# Patient Record
Sex: Male | Born: 1945 | Race: White | Hispanic: No | Marital: Married | Smoking: Former smoker
Health system: Southern US, Community
[De-identification: ages and names within clinical notes are randomized; demographics above are authoritative.]

## PROBLEM LIST (undated history)

## (undated) DIAGNOSIS — E119 Type 2 diabetes mellitus without complications: Secondary | ICD-10-CM

## (undated) DIAGNOSIS — E7800 Pure hypercholesterolemia, unspecified: Secondary | ICD-10-CM

## (undated) DIAGNOSIS — I10 Essential (primary) hypertension: Secondary | ICD-10-CM

## (undated) DIAGNOSIS — G7000 Myasthenia gravis without (acute) exacerbation: Secondary | ICD-10-CM

## (undated) DIAGNOSIS — N200 Calculus of kidney: Secondary | ICD-10-CM

## (undated) DIAGNOSIS — M1990 Unspecified osteoarthritis, unspecified site: Secondary | ICD-10-CM

## (undated) HISTORY — DX: Calculus of kidney: N20.0

## (undated) HISTORY — DX: Pure hypercholesterolemia, unspecified: E78.00

## (undated) HISTORY — PX: COLONOSCOPY: SHX174

## (undated) HISTORY — DX: Type 2 diabetes mellitus without complications: E11.9

## (undated) HISTORY — DX: Unspecified osteoarthritis, unspecified site: M19.90

## (undated) HISTORY — DX: Essential (primary) hypertension: I10

---

## 2011-06-29 DEATH — deceased

## 2014-07-29 HISTORY — PX: SKIN CANCER EXCISION: SHX779

## 2017-02-21 ENCOUNTER — Encounter: Payer: Self-pay | Admitting: Neurology

## 2017-02-21 ENCOUNTER — Ambulatory Visit (INDEPENDENT_AMBULATORY_CARE_PROVIDER_SITE_OTHER): Payer: Medicare Other | Admitting: Neurology

## 2017-02-21 VITALS — Ht 71.0 in | Wt 217.0 lb

## 2017-02-21 DIAGNOSIS — H02402 Unspecified ptosis of left eyelid: Secondary | ICD-10-CM | POA: Diagnosis not present

## 2017-02-21 DIAGNOSIS — D150 Benign neoplasm of thymus: Secondary | ICD-10-CM

## 2017-02-21 DIAGNOSIS — D4989 Neoplasm of unspecified behavior of other specified sites: Secondary | ICD-10-CM

## 2017-02-21 DIAGNOSIS — G7000 Myasthenia gravis without (acute) exacerbation: Secondary | ICD-10-CM

## 2017-02-21 DIAGNOSIS — G902 Horner's syndrome: Secondary | ICD-10-CM

## 2017-02-21 DIAGNOSIS — R29818 Other symptoms and signs involving the nervous system: Secondary | ICD-10-CM

## 2017-02-21 DIAGNOSIS — H5700 Unspecified anomaly of pupillary function: Secondary | ICD-10-CM | POA: Diagnosis not present

## 2017-02-21 DIAGNOSIS — I671 Cerebral aneurysm, nonruptured: Secondary | ICD-10-CM | POA: Diagnosis not present

## 2017-02-21 DIAGNOSIS — I670 Dissection of cerebral arteries, nonruptured: Secondary | ICD-10-CM | POA: Diagnosis not present

## 2017-02-21 DIAGNOSIS — I639 Cerebral infarction, unspecified: Secondary | ICD-10-CM

## 2017-02-21 MED ORDER — PYRIDOSTIGMINE BROMIDE 60 MG PO TABS: 60.0000 mg | ORAL_TABLET | Freq: Three times a day (TID) | ORAL | 6 refills | Status: DC

## 2017-02-21 NOTE — Patient Instructions (Addendum)
Remember to drink plenty of fluid, eat healthy meals and do not skip any meals. Try to eat protein with a every meal and eat a healthy snack such as fruit or nuts in between meals. Try to keep a regular sleep-wake schedule and try to exercise daily, particularly in the form of walking, 20-30 minutes a day, if you can.   As far as your medications are concerned, I would like to suggest: Pyridostigmine 1/2 a tablet 3 times a day 3-4 hours apart can increase to a whole pill if no side effects.  As far as diagnostic testing: MRI of the brain, MRA of the head and neck, CT scan of the chest, EMG nerve conduction study.  I would like to see you back in 3 weeks for follow up as well as emg/ncs, sooner if we need to. Please call us with any interim questions, concerns, problems, updates or refill requests.   Our phone number is [phone]. We also have an after hours call service for urgent matters and there is a physician on-call for urgent questions. For any emergencies you know to call 911 or go to the nearest emergency room  Myasthenia Gravis Myasthenia gravis (MG) means severe weakness. It is a long-term (chronic) condition that causes weakness in the muscles you can control (voluntary muscles). MG can affect any voluntary muscle. The muscles most often affected are the ones that control:  Eye movement.  Facial movements.  Swallowing.  MG is an autoimmune disease, which means that your body's defense system (immune system) attacks healthy parts of your body instead of germs and other things that make you sick. When you have MG, your immune system makes proteins (antibodies) that block the chemical (acetylcholine) your body needs to send nerve signals to your muscles. This causes muscle weakness. What are the causes? The exact cause of MG is unknown. One possible cause is an enlarged thymus gland, which is located under your breastbone. What are the signs or symptoms? The earliest symptom of MG  is muscle weakness that gets worse with activity and gets better after rest. Other symptoms of MG may include:  Drooping eyelids.  Double vision.  Loss of facial expression.  Trouble chewing and swallowing.  Slurred speech.  A waddling walk.  Weakness of the arms, hands, and legs.  Trouble breathing is the most dangerous symptom of MG. Sudden and severe difficulty breathing (myasthenic crisis) may require emergency breathing support. This symptom sometimes happens after:  Infection.  Fever.  Drug reaction.  How is this diagnosed? It can be hard to diagnose MG because muscle weakness is a common symptom in many conditions. Your health care provider will do a physical exam. You may also have tests that will help make a diagnosis. These may include:  A blood test.  A test using the medicine edrophonium. This medicine increases muscle strength by slowing the breakdown of acetylcholine.  Tests to measure nerve conduction to muscle (electromyography).  An imaging study of the chest (CT or MRI).  How is this treated? Treatment can improve muscle strength. Sometimes symptoms of MG go away for a while (remission) and you can stop treatment. Possible treatments include:  Medicine.  Removal of the thymus gland (thymectomy). This may result in a long remission for some people.  Follow these instructions at home:  Take medicines only as directed by your health care provider.  Get plenty of rest to conserve your energy.  Take frequent breaks to rest your eyes.  Maintain a  healthy diet and a healthy weight.  Do not use any tobacco products including cigarettes, chewing tobacco, or electronic cigarettes. If you need help quitting, ask your health care provider.  Keep all follow-up visits as directed by your health care provider. This is important. Contact a health care provider if:  Your symptoms get worse after a fever or infection.  You have a reaction to a medicine you  are taking.  Your symptoms change or get worse. Get help right away if: You have trouble breathing. This information is not intended to replace advice given to you by your health care provider. Make sure you discuss any questions you have with your health care provider. Document Released: 10/21/2000 Document Revised: 12/21/2015 Document Reviewed: 09/15/2013 Elsevier Interactive Patient Education  2018 [employer].  Pyridostigmine tablets, extended-release What is this medicine? PYRIDOSTIGMINE (peer id oh STIG meen) can help with muscle strength. It is used to treat myasthenia gravis. This medicine may be used for other purposes; ask your health care provider or pharmacist if you have questions. COMMON BRAND NAME(S): Mestinon What should I tell my health care provider before I take this medicine? They need to know if you have any of these conditions: -asthma -difficulty passing urine -heart disease -infection in abdomen, peritonitis -irregular, slow heartbeat -kidney disease -seizures -stomach or bowel obstruction or ulcers -thyroid disease -an unusual or allergic reaction to pyridostigmine, bromides, other medicines, foods, dyes, or preservatives -pregnant or trying to get pregnant -breast-feeding How should I use this medicine? Take this medicine by mouth with a glass of water. Follow the directions on the prescription label. Do not crush or chew. Take your medicine at regular intervals. Do not take your medicine more often than directed. Do not stop taking except on your doctor's advice. Talk to your pediatrician regarding the use of this medicine in children. Special care may be needed. Overdosage: If you think you have taken too much of this medicine contact a poison control center or emergency room at once. NOTE: This medicine is only for you. Do not share this medicine with others. What if I miss a dose? If you miss a dose, take it as soon as you can. If it is almost time  for your next dose, take only that dose. Do not take double or extra doses. What may interact with this medicine? Do not take this medicine with any of the following medications: -other medicines for myasthenia gravis like neostigmine -quinine This medicine may also interact with the following medications: -atropine -bethanechol -disopyramide -edrophonium -guanadrel -guanethidine -mecamylamine -medicines that block muscle or nerve pain This list may not describe all possible interactions. Give your health care provider a list of all the medicines, herbs, non-prescription drugs, or dietary supplements you use. Also tell them if you smoke, drink alcohol, or use illegal drugs. Some items may interact with your medicine. What should I watch for while using this medicine? Visit your doctor or health care professional for regular checks on your progress. Tell your doctor if your symptoms do not improve or if they get worse. Wear a medical ID bracelet or chain, and carry a card that describes your disease and details of your medicine and dosage times. What side effects may I notice from receiving this medicine? Side effects that you should report to your doctor or health care professional as soon as possible: -allergic reactions like skin rash, itching or hives, swelling of the face, lips, or tongue -breathing problems -changes in vision -muscle cramps, spasm -  slow or irregular heartbeat -stomach cramps, pain -unusually weak or tired -vomiting Side effects that usually do not require medical attention (report to your doctor or health care professional if they continue or are bothersome): -diarrhea, especially at start of treatment -increased saliva -increased sweating -nausea This list may not describe all possible side effects. Call your doctor for medical advice about side effects. You may report side effects to FDA at 1-800-FDA-1088. Where should I keep my medicine? Keep out of the  reach of children. Store at room temperature between 15 and 30 degrees C (59 and 86 degrees F). Keep container tightly closed. Protect from moisture. Throw away any unused medicine after the expiration date. NOTE: This sheet is a summary. It may not cover all possible information. If you have questions about this medicine, talk to your doctor, pharmacist, or health care provider.  2018 Elsevier/Gold Standard (2008-02-19 11:19:07)

## 2017-02-21 NOTE — Progress Notes (Signed)
[facility]    Provider:  Dr Jaynee Eagles Referring Provider: [name], MD Primary Care Physician:  [name], MD  CC: left upper eyelid droop concern for myasthenia gravis  HPI:  Bob Warren is a 71 y.o. male here as a referral from [name], MD for left upper eyelid droop. Past medical history diabetes type 2, hypertension, hyperlipidemia. Eyelid drooping started last Sunday. He noticed it a little Sunday and Monday went to the eye. Not so bad in the morning. Worse at night. The ptosis is still there in the morning. No double vision. The right eye is fine. No difficulty swallowing or chewing, no changes in voice quality, no shortness of breath, He feels his left leg is getting weaker and when going down it feels weaker. Having difficulty getting out of low seats, no difficulty with raising arms overhead. No Fhx of autoimmune disorders. No other focal neurologic deficits, associated symptoms, inciting events or modifiable factors.  Reviewed notes, labs and imaging from outside physicians, which showed:  Reviewed [facility] records. Patient presented July 18 for drooping of eyelids. Occurred suddenly 4 days previous constant with no change. He denies diplopia, headaches, decreased vision, shortness of breath or other symptoms. Hasn't really changed since it became droopy. Blood sugars have been running high lately hemoglobin A1c 7.5. OD corrected 20/30, OS 20/50 corrected. Intraocular pressure normal. Pupils show anisocoria OS greater than OD. Open angles, extraocular movements are full, slit lamp exam normal, funduscopic exam normal, eyelids with dermatochalasis, OS with ptosis. Ice test was positive.  Review of Systems: Patient complains of symptoms per HPI as well as the following symptoms: No CP, no SOB. Pertinent negatives and positives per HPI. All others negative.   Social History   Social History  . Marital status: Married    Spouse name:  N/A  . Number of children: 2  . Years of education: 12   Occupational History  . Retired    Social History Main Topics  . Smoking status: Former Research scientist (life sciences)  . Smokeless tobacco: Never Used     Comment: Quit 25 yrs ago  . Alcohol use No     Comment: Quit 25 yrs ago  . Drug use: No  . Sexual activity: Not on file   Other Topics Concern  . Not on file   Social History Narrative   Lives at home w/ his wife   Right-handed   Caffeine: 3-6 cups of coffee per day       Family History  Problem Relation Age of Onset  . Cancer Mother   . Other Father        Gunshot wound  . Diabetes Maternal Grandfather     Past Medical History:  Diagnosis Date  . Diabetes ([place])   . High cholesterol   . Hypertension     Past Surgical History:  Procedure Laterality Date  . SKIN CANCER EXCISION Right 2016   Arm    Current Outpatient Prescriptions  Medication Sig Dispense Refill  . amLODipine (NORVASC) 10 MG tablet Take 10 mg by mouth.    Marland Kitchen aspirin 81 MG chewable tablet Chew 81 mg by mouth.    Marland Kitchen atenolol (TENORMIN) 50 MG tablet Take 50 mg by mouth.    Marland Kitchen atorvastatin (LIPITOR) 20 MG tablet Take 20 mg by mouth.    . Cholecalciferol (VITAMIN D3) 1000 units CAPS Take by mouth.    . hydrochlorothiazide (HYDRODIURIL) 25 MG tablet Take 25 mg by mouth.    Marland Kitchen  Ibuprofen-Diphenhydramine Cit (ADVIL PM PO) Take 2 capsules by mouth at bedtime.    . Insulin Glargine (LANTUS Adams) Inject 28 Units into the skin daily.    Marland Kitchen losartan (COZAAR) 25 MG tablet Take 25 mg by mouth.    . metFORMIN (GLUCOPHAGE) 850 MG tablet Take 850 mg by mouth.    . Omega-3 Fatty Acids (FISH OIL) 1000 MG CAPS Take 2 capsules by mouth daily.    Marland Kitchen pyridostigmine (MESTINON) 60 MG tablet Take 1 tablet (60 mg total) by mouth 3 (three) times daily. 90 tablet 6   No current facility-administered medications for this visit.     Allergies as of 02/21/2017 - Review Complete 02/21/2017  Allergen Reaction Noted  . Lisinopril  03/15/2013     Vitals: Ht 5' 11"  (1.803 m)   Wt 217 lb (98.4 kg)   BMI 30.27 kg/m  Last Weight:  Wt Readings from Last 1 Encounters:  02/21/17 217 lb (98.4 kg)   Last Height:   Ht Readings from Last 1 Encounters:  02/21/17 5' 11"  (1.803 m)   BP 120/80  Physical exam: Exam: Gen: NAD, conversant, well nourised, obese, well groomed                     CV: RRR, no MRG. No Carotid Bruits. No peripheral edema, warm, nontender Eyes: Conjunctivae clear without exudates or hemorrhage  Neuro: Detailed Neurologic Exam  Speech:    Speech is normal; fluent and spontaneous with normal comprehension.  Cognition:    The patient is oriented to person, place, and time;     recent and remote memory intact;     language fluent;     normal attention, concentration,     fund of knowledge Cranial Nerves:    The pupils are unequal, left fixed (surgical), right round and reactive.  Fatiguability on upgaze. the fundi are normal and spontaneous venous pulsations are present. Visual fields are full to finger confrontation. Extraocular movements are intact with fatiguability. Trigeminal sensation is intact and the muscles of mastication are normal. The face is symmetric. The palate elevates in the midline. Hearing intact. Voice is normal. Shoulder shrug is normal. The tongue has normal motion without fasciculations.   Coordination:    Normal finger to nose and heel to shin. Normal rapid alternating movements.   Gait:    Heel-toe and tandem gait are normal.   Motor Observation:    No asymmetry, no atrophy, and no involuntary movements noted. Tone:    Normal muscle tone.    Posture:    Posture is normal. normal erect    Strength:    Strength is V/V in the upper and lower limbs.      Sensation: intact to LT     Reflex Exam:  DTR's:    Deep tendon reflexes in the upper and lower extremities are normal bilaterally.   Toes:    The toes are downgoing bilaterally.   Clonus:    Clonus is absent.      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 Need to rule out Horner syndrome as well as thymoma with CT chest and MRA of the neck due to dissection, MRI brain and MRA head for stroke or aneurysm or lseion. Start Mestinon. EMG/NCS for repetitive nerve  stimulation.  Discussed myasthenic crisis, for any SOB or acute weakness proceed to ED immediately   Orders Placed This Encounter  Procedures  . MR MRA HEAD W WO CONTRAST  . MR MRA HEAD WO CONTRAST  . MR MRA NECK W WO CONTRAST  . Acetylcholine receptor, binding  . Acetylcholine receptor, blocking  . Acetylcholine receptor, modulating  . CK  . CBC  . Comprehensive metabolic panel  . TSH    Cc: [name], MD  Sarina Ill, MD  [facility] [address] [place], [place] [address]  Phone [phone] Fax [phone]

## 2017-03-02 ENCOUNTER — Telehealth: Payer: Self-pay | Admitting: Neurology

## 2017-03-02 LAB — COMPREHENSIVE METABOLIC PANEL
ALBUMIN: 4.2 g/dL (ref 3.5–4.8)
ALT: 20 IU/L (ref 0–44)
AST: 17 IU/L (ref 0–40)
Albumin/Globulin Ratio: 1.5 (ref 1.2–2.2)
Alkaline Phosphatase: 50 IU/L (ref 39–117)
BUN/Creatinine Ratio: 17 (ref 10–24)
BUN: 18 mg/dL (ref 8–27)
Bilirubin Total: 0.2 mg/dL (ref 0.0–1.2)
CALCIUM: 9.8 mg/dL (ref 8.6–10.2)
CO2: 24 mmol/L (ref 20–29)
CREATININE: 1.03 mg/dL (ref 0.76–1.27)
Chloride: 103 mmol/L (ref 96–106)
GFR calc Af Amer: 85 mL/min/{1.73_m2} (ref >59)
GFR, EST NON AFRICAN AMERICAN: 73 mL/min/{1.73_m2} (ref >59)
GLOBULIN, TOTAL: 2.8 g/dL (ref 1.5–4.5)
Glucose: 147 mg/dL — ABNORMAL HIGH (ref 65–99)
Potassium: 4.5 mmol/L (ref 3.5–5.2)
SODIUM: 143 mmol/L (ref 134–144)
TOTAL PROTEIN: 7 g/dL (ref 6.0–8.5)

## 2017-03-02 LAB — ACETYLCHOLINE RECEPTOR, BINDING: AChR Binding Ab, Serum: 0.6 nmol/L — ABNORMAL HIGH (ref 0.00–0.24)

## 2017-03-02 LAB — CBC
HEMATOCRIT: 41.2 % (ref 37.5–51.0)
HEMOGLOBIN: 13.3 g/dL (ref 13.0–17.7)
MCH: 28.5 pg (ref 26.6–33.0)
MCHC: 32.3 g/dL (ref 31.5–35.7)
MCV: 88 fL (ref 79–97)
Platelets: 296 10*3/uL (ref 150–379)
RBC: 4.66 x10E6/uL (ref 4.14–5.80)
RDW: 14.2 % (ref 12.3–15.4)
WBC: 7.9 10*3/uL (ref 3.4–10.8)

## 2017-03-02 LAB — CK: CK TOTAL: 76 U/L (ref 24–204)

## 2017-03-02 LAB — ACETYLCHOLINE RECEPTOR, MODULATING: Acetylcholine Modulat Ab: 28 % — ABNORMAL HIGH (ref 0–20)

## 2017-03-02 LAB — ACETYLCHOLINE RECEPTOR, BLOCKING: Acetylchol Block Ab: 25 % (ref 0–25)

## 2017-03-02 LAB — TSH: TSH: 3.73 u[IU]/mL (ref 0.450–4.500)

## 2017-03-02 NOTE — Addendum Note (Signed)
Addended by: Sarina Ill B on: 03/02/2017 06:59 PM   Modules accepted: Orders

## 2017-03-02 NOTE — Telephone Encounter (Signed)
This patient also needs an MRi brain that I just ordered. His other imaging is scheduled can we get this added?

## 2017-03-03 NOTE — Telephone Encounter (Signed)
I spoke to Mardene Celeste at [facility] they are fully booked for this Friday and is not able to add the Brain along with his other exams. But she is going to call the patient and see if he would like to come in on Sunday because they do have an opening then.

## 2017-03-03 NOTE — Telephone Encounter (Signed)
Great - thanks

## 2017-03-05 ENCOUNTER — Other Ambulatory Visit: Payer: Self-pay | Admitting: Neurology

## 2017-03-05 DIAGNOSIS — D4989 Neoplasm of unspecified behavior of other specified sites: Secondary | ICD-10-CM

## 2017-03-05 DIAGNOSIS — I639 Cerebral infarction, unspecified: Secondary | ICD-10-CM

## 2017-03-05 DIAGNOSIS — I671 Cerebral aneurysm, nonruptured: Secondary | ICD-10-CM

## 2017-03-05 DIAGNOSIS — H02402 Unspecified ptosis of left eyelid: Secondary | ICD-10-CM

## 2017-03-05 DIAGNOSIS — G7000 Myasthenia gravis without (acute) exacerbation: Secondary | ICD-10-CM

## 2017-03-05 DIAGNOSIS — D150 Benign neoplasm of thymus: Secondary | ICD-10-CM

## 2017-03-05 DIAGNOSIS — H5700 Unspecified anomaly of pupillary function: Secondary | ICD-10-CM

## 2017-03-05 DIAGNOSIS — G902 Horner's syndrome: Secondary | ICD-10-CM

## 2017-03-05 DIAGNOSIS — I670 Dissection of cerebral arteries, nonruptured: Secondary | ICD-10-CM

## 2017-03-06 ENCOUNTER — Ambulatory Visit (INDEPENDENT_AMBULATORY_CARE_PROVIDER_SITE_OTHER): Payer: Self-pay | Admitting: Neurology

## 2017-03-06 ENCOUNTER — Ambulatory Visit
Admission: RE | Admit: 2017-03-06 | Discharge: 2017-03-06 | Disposition: A | Discharge status: Home / Self Care | Payer: Medicare Other | Source: Ambulatory Visit | Attending: Neurology | Admitting: Neurology

## 2017-03-06 ENCOUNTER — Ambulatory Visit (INDEPENDENT_AMBULATORY_CARE_PROVIDER_SITE_OTHER): Payer: Medicare Other | Admitting: Neurology

## 2017-03-06 ENCOUNTER — Telehealth: Payer: Self-pay | Admitting: Neurology

## 2017-03-06 DIAGNOSIS — G7000 Myasthenia gravis without (acute) exacerbation: Secondary | ICD-10-CM

## 2017-03-06 DIAGNOSIS — I639 Cerebral infarction, unspecified: Secondary | ICD-10-CM

## 2017-03-06 DIAGNOSIS — D4989 Neoplasm of unspecified behavior of other specified sites: Secondary | ICD-10-CM

## 2017-03-06 DIAGNOSIS — I671 Cerebral aneurysm, nonruptured: Secondary | ICD-10-CM

## 2017-03-06 DIAGNOSIS — H5700 Unspecified anomaly of pupillary function: Secondary | ICD-10-CM

## 2017-03-06 DIAGNOSIS — G902 Horner's syndrome: Secondary | ICD-10-CM

## 2017-03-06 DIAGNOSIS — D150 Benign neoplasm of thymus: Secondary | ICD-10-CM

## 2017-03-06 DIAGNOSIS — I670 Dissection of cerebral arteries, nonruptured: Secondary | ICD-10-CM

## 2017-03-06 DIAGNOSIS — Z0289 Encounter for other administrative examinations: Secondary | ICD-10-CM

## 2017-03-06 DIAGNOSIS — H02402 Unspecified ptosis of left eyelid: Secondary | ICD-10-CM

## 2017-03-06 NOTE — Progress Notes (Signed)
See procedure note.

## 2017-03-06 NOTE — Telephone Encounter (Signed)
Made WI appt for pt per Dr. Jaynee Eagles request for 03-27-17 at 0900.  LMVM  For pt about appt and to call and confirm that this is ok.

## 2017-03-06 NOTE — Progress Notes (Signed)
        Full Name: Bob Warren Gender: Male MRN #: [id number] Date of Birth: 12-14-2045    Visit Date: 03/06/17 09:53 Age: 71 Years 69 Months Old Examining Physician: Sarina Ill, MD  Referring Physician: Jaynee Eagles, MD  History: Patient with Ocular Myasthenia Gravis    Summary: All nerves and muscles (as detailed in the following tables) were within normal limits.  Conclusion: This is a normal study. No electrophysiologic evidence for polyneuropathy or myopathy. Repetitive Nerve Stimulation failed to reveal any significant decrement to support generalized Myasthenia Gravis. 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   Sarina Ill M.D.  [facility] [place], [place] [address] Tel: [phone] Fax: [phone]        Rep Stim    Anatomy / Train Rate Ampl. Ampl 4-1 Fac Ampl Area Area 4-1 Fac Area   Hz mV % % mVms % %  R Abductor digiti minimi (manus) - (Ulnar)  Baseline @1Hz  1 9.7 -0.1 100 18.0 -0.6 100  Baseline @3Hz  3 9.6 1.3 99.2 17.7 -1.3 98.1  Post Exercise @0 :00 3 10.6 -2.1 110 19.6 -2.8 109  @ 0:30 3 10.1 -0.9 105 20.0 -3.4 111  @ 1:00 3 10.1 -1.6 105 19.8 -3.9 110  @ 2:00 3 10.1 -2.1 104 19.2 -4.2 107  @ 3:00 3 9.9 -1.9 103 19.0 -4.2 106  @ 4:00 3 9.9 -1.3 102 18.5 -3 103  @ 5:00 3 9.8 -1.4 101 18.2 -2.8 101  R Trapezius (upper) - (Accessory spinal)  Baseline @1Hz  1 4.9 -0.8 100 43.2 -5.5 100  Baseline @3Hz  3 4.9 -0.5 99.5 43.0 -12.4 99.4  Post Exercise @0 :00 3 5.0 -1.4 102 40.5 -9.2 93.7  @ 0:30 3 5.0 -1.4 102 42.3 -10.6 97.7  @ 1:00 3 5.0 -1.5 102 42.3 -10.6 97.7  @ 2:00 3 4.8 -0.8 98.1 43.3 -10.9 100  @ 3:00 3 4.8 0.4 98.2 43.4 -11 100  @ 4:00 3 4.7 -0.5 95.8 37.8 -14 87.4  @ 5:00 3 4.9 -1.2 99 43.0 -13.5 99.5        EMG full       EMG Summary Table    Spontaneous MUAP Recruitment  Muscle IA Fib PSW Fasc Other Amp  Dur. Poly Pattern  R. Deltoid Normal None None None _______ Normal Normal Normal Normal  R. Triceps brachii Normal None None None _______ Normal Normal Normal Normal  R. Pronator teres Normal None None None _______ Normal Normal Normal Normal  R. First dorsal interosseous Normal None None None _______ Normal Normal Normal Normal  R. Opponens pollicis Normal None None None _______ Normal Normal Normal Normal  R. Biceps brachii Normal None None None _______ Normal Normal Normal Normal  R. Cervical paraspinals (low) Normal None None None _______ Normal Normal Normal Normal

## 2017-03-07 ENCOUNTER — Ambulatory Visit
Admission: RE | Admit: 2017-03-07 | Discharge: 2017-03-07 | Disposition: A | Discharge status: Home / Self Care | Payer: Medicare Other | Source: Ambulatory Visit | Attending: Neurology | Admitting: Neurology

## 2017-03-07 DIAGNOSIS — H02402 Unspecified ptosis of left eyelid: Secondary | ICD-10-CM

## 2017-03-07 DIAGNOSIS — H5700 Unspecified anomaly of pupillary function: Secondary | ICD-10-CM

## 2017-03-07 DIAGNOSIS — I639 Cerebral infarction, unspecified: Secondary | ICD-10-CM | POA: Diagnosis not present

## 2017-03-07 DIAGNOSIS — G7000 Myasthenia gravis without (acute) exacerbation: Secondary | ICD-10-CM

## 2017-03-07 DIAGNOSIS — D150 Benign neoplasm of thymus: Secondary | ICD-10-CM

## 2017-03-07 DIAGNOSIS — D4989 Neoplasm of unspecified behavior of other specified sites: Secondary | ICD-10-CM

## 2017-03-07 DIAGNOSIS — I671 Cerebral aneurysm, nonruptured: Secondary | ICD-10-CM

## 2017-03-07 DIAGNOSIS — G902 Horner's syndrome: Secondary | ICD-10-CM

## 2017-03-07 DIAGNOSIS — I670 Dissection of cerebral arteries, nonruptured: Secondary | ICD-10-CM

## 2017-03-07 MED ORDER — GADOBENATE DIMEGLUMINE 529 MG/ML IV SOLN
20.0000 mL | Freq: Once | INTRAVENOUS | Status: AC | PRN
  Administered 2017-03-07: 20 mL via INTRAVENOUS

## 2017-03-08 NOTE — Procedures (Signed)
        Full Name: Taimur Fier Gender: Male MRN #: [id number] Date of Birth: 06-09-2046    Visit Date: 03/06/17 09:53 Age: 71 Years 25 Months Old Examining Physician: Sarina Ill, MD  Referring Physician: Jaynee Eagles, MD  History: Patient with Ocular Myasthenia Gravis    Summary: All nerves and muscles (as detailed in the following tables) were within normal limits.  Conclusion: This is a normal study. No electrophysiologic evidence for polyneuropathy or myopathy. Repetitive Nerve Stimulation failed to reveal any significant decrement to support generalized Myasthenia Gravis. 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   Sarina Ill M.D.  [facility] [place], [place] [address] Tel: [phone] Fax: [phone]        Rep Stim    Anatomy / Train Rate Ampl. Ampl 4-1 Fac Ampl Area Area 4-1 Fac Area   Hz mV % % mVms % %  R Abductor digiti minimi (manus) - (Ulnar)  Baseline @1Hz  1 9.7 -0.1 100 18.0 -0.6 100  Baseline @3Hz  3 9.6 1.3 99.2 17.7 -1.3 98.1  Post Exercise @0 :00 3 10.6 -2.1 110 19.6 -2.8 109  @ 0:30 3 10.1 -0.9 105 20.0 -3.4 111  @ 1:00 3 10.1 -1.6 105 19.8 -3.9 110  @ 2:00 3 10.1 -2.1 104 19.2 -4.2 107  @ 3:00 3 9.9 -1.9 103 19.0 -4.2 106  @ 4:00 3 9.9 -1.3 102 18.5 -3 103  @ 5:00 3 9.8 -1.4 101 18.2 -2.8 101  R Trapezius (upper) - (Accessory spinal)  Baseline @1Hz  1 4.9 -0.8 100 43.2 -5.5 100  Baseline @3Hz  3 4.9 -0.5 99.5 43.0 -12.4 99.4  Post Exercise @0 :00 3 5.0 -1.4 102 40.5 -9.2 93.7  @ 0:30 3 5.0 -1.4 102 42.3 -10.6 97.7  @ 1:00 3 5.0 -1.5 102 42.3 -10.6 97.7  @ 2:00 3 4.8 -0.8 98.1 43.3 -10.9 100  @ 3:00 3 4.8 0.4 98.2 43.4 -11 100  @ 4:00 3 4.7 -0.5 95.8 37.8 -14 87.4  @ 5:00 3 4.9 -1.2 99 43.0 -13.5 99.5        EMG full       EMG Summary Table    Spontaneous MUAP Recruitment  Muscle IA Fib PSW Fasc Other Amp  Dur. Poly Pattern  R. Deltoid Normal None None None _______ Normal Normal Normal Normal  R. Triceps brachii Normal None None None _______ Normal Normal Normal Normal  R. Pronator teres Normal None None None _______ Normal Normal Normal Normal  R. First dorsal interosseous Normal None None None _______ Normal Normal Normal Normal  R. Opponens pollicis Normal None None None _______ Normal Normal Normal Normal  R. Biceps brachii Normal None None None _______ Normal Normal Normal Normal  R. Cervical paraspinals (low) Normal None None None _______ Normal Normal Normal Normal

## 2017-03-10 NOTE — Telephone Encounter (Signed)
I spoke to Bob Warren and relayed that he received message and will be coming in on 03-27-17 at 0900.

## 2017-03-13 ENCOUNTER — Telehealth: Payer: Self-pay | Admitting: Neurology

## 2017-03-13 NOTE — Telephone Encounter (Signed)
Please cancel his appt with Dr. Jaynee Eagles on Sept 5

## 2017-03-14 ENCOUNTER — Ambulatory Visit
Admission: RE | Admit: 2017-03-14 | Discharge: 2017-03-14 | Disposition: A | Discharge status: Home / Self Care | Payer: Medicare Other | Source: Ambulatory Visit | Attending: Neurology | Admitting: Neurology

## 2017-03-14 DIAGNOSIS — I671 Cerebral aneurysm, nonruptured: Secondary | ICD-10-CM

## 2017-03-14 DIAGNOSIS — G7000 Myasthenia gravis without (acute) exacerbation: Secondary | ICD-10-CM

## 2017-03-14 DIAGNOSIS — G902 Horner's syndrome: Secondary | ICD-10-CM

## 2017-03-14 DIAGNOSIS — H02402 Unspecified ptosis of left eyelid: Secondary | ICD-10-CM | POA: Diagnosis not present

## 2017-03-14 DIAGNOSIS — I670 Dissection of cerebral arteries, nonruptured: Secondary | ICD-10-CM

## 2017-03-14 DIAGNOSIS — R29818 Other symptoms and signs involving the nervous system: Secondary | ICD-10-CM

## 2017-03-14 DIAGNOSIS — D4989 Neoplasm of unspecified behavior of other specified sites: Secondary | ICD-10-CM

## 2017-03-14 DIAGNOSIS — D150 Benign neoplasm of thymus: Secondary | ICD-10-CM

## 2017-03-14 DIAGNOSIS — H5700 Unspecified anomaly of pupillary function: Secondary | ICD-10-CM

## 2017-03-14 DIAGNOSIS — I639 Cerebral infarction, unspecified: Secondary | ICD-10-CM

## 2017-03-14 MED ORDER — GADOBENATE DIMEGLUMINE 529 MG/ML IV SOLN
20.0000 mL | Freq: Once | INTRAVENOUS | Status: AC | PRN
  Administered 2017-03-14: 20 mL via INTRAVENOUS

## 2017-03-26 ENCOUNTER — Ambulatory Visit: Payer: Medicare Other | Admitting: Neurology

## 2017-03-27 ENCOUNTER — Encounter: Payer: Self-pay | Admitting: Neurology

## 2017-03-27 ENCOUNTER — Ambulatory Visit (INDEPENDENT_AMBULATORY_CARE_PROVIDER_SITE_OTHER): Payer: Medicare Other | Admitting: Neurology

## 2017-03-27 DIAGNOSIS — G7000 Myasthenia gravis without (acute) exacerbation: Secondary | ICD-10-CM | POA: Insufficient documentation

## 2017-03-27 DIAGNOSIS — E118 Type 2 diabetes mellitus with unspecified complications: Secondary | ICD-10-CM

## 2017-03-27 DIAGNOSIS — E119 Type 2 diabetes mellitus without complications: Secondary | ICD-10-CM | POA: Insufficient documentation

## 2017-03-27 MED ORDER — MYCOPHENOLATE MOFETIL 500 MG PO TABS: 500.0000 mg | ORAL_TABLET | Freq: Two times a day (BID) | ORAL | 11 refills | Status: DC

## 2017-03-27 NOTE — Progress Notes (Signed)
PATIENT: Bob Warren DOB: [birth year]  Chief Complaint  Patient presents with  . Myasthenia Gravis    He is here with his wife, [name] and granddaughter, Jarrett Soho.  He was referred by Dr. Jaynee Eagles.  Feels his left ptosis has improved since starting Mestinon.     HISTORICAL  Bob Warren is a 71 years old right-handed male, seen in refer by  my colleague Dr. Jaynee Eagles to continue follow-up for his myasthenia gravis.  He was seen by Dr. Jaynee Eagles on February 21 2017 as a referral from [name], MD for left upper eyelid droop.  He has past medical history of type 2 diabetes, hypertension, hyperlipidemia.   He noted left eyelid droopy around February 08 2017, intermittent, worse at nighttime, fatigue, at its worst, left upper eyelid would cover half of the left eye, with mild blocking of his left vision. he denies double vision, no swallowing difficulty, no limb muscle weakness noted.  Since initial visit, laboratory evaluations in July 2018 showed positive acetylcholine modulating antibody, binding antibody,Normal TSH, CPK, CBC, CMP with exception of mild elevated glucose 147,   He was also getting a prescription of Mestinon 60 mg 3 times a day, he tolerated the medication well, with Mestinon treatment, he barely notice any significant droopy eyelid. Personally reviewed MRI of the brain August 2018, moderate atrophy, supratentorium small vessel disease. 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 Repetitive nerve stimulation March 06 2017 of right abductor digital minimum show no significant abnormality  On today's examination, he was noted to have mild double vision, bulbar, proximal upper and lower extremity weakness,   REVIEW OF SYSTEMS: Full 14 system review of systems performed and notable only for as above  ALLERGIES: Allergies  Allergen Reactions  . Lisinopril     Other reaction(s): Other (See  Comments) Cough    HOME MEDICATIONS: Current Outpatient Prescriptions  Medication Sig Dispense Refill  . amLODipine (NORVASC) 10 MG tablet Take 10 mg by mouth.    Marland Kitchen aspirin 81 MG chewable tablet Chew 81 mg by mouth.    Marland Kitchen atenolol (TENORMIN) 50 MG tablet Take 50 mg by mouth.    Marland Kitchen atorvastatin (LIPITOR) 20 MG tablet Take 20 mg by mouth.    . Cholecalciferol (VITAMIN D3) 1000 units CAPS Take by mouth.    . hydrochlorothiazide (HYDRODIURIL) 25 MG tablet Take 25 mg by mouth.    . Ibuprofen-Diphenhydramine Cit (ADVIL PM PO) Take 2 capsules by mouth at bedtime.    . Insulin Glargine (LANTUS ) Inject 28 Units into the skin daily.    Marland Kitchen losartan (COZAAR) 25 MG tablet Take 25 mg by mouth.    . metFORMIN (GLUCOPHAGE) 850 MG tablet Take 850 mg by mouth.    . Omega-3 Fatty Acids (FISH OIL) 1000 MG CAPS Take 2 capsules by mouth daily.    Marland Kitchen pyridostigmine (MESTINON) 60 MG tablet Take 1 tablet (60 mg total) by mouth 3 (three) times daily. 90 tablet 6   No current facility-administered medications for this visit.     PAST MEDICAL HISTORY: Past Medical History:  Diagnosis Date  . Diabetes ([place])   . High cholesterol   . Hypertension     PAST SURGICAL HISTORY: Past Surgical History:  Procedure Laterality Date  . SKIN CANCER EXCISION Right 2016   Arm    FAMILY HISTORY: Family History  Problem Relation Age of Onset  . Cancer Mother   .  Other Father        Gunshot wound  . Diabetes Maternal Grandfather     SOCIAL HISTORY:  Social History   Social History  . Marital status: Married    Spouse name: N/A  . Number of children: 2  . Years of education: 12   Occupational History  . Retired    Social History Main Topics  . Smoking status: Former Research scientist (life sciences)  . Smokeless tobacco: Never Used     Comment: Quit 25 yrs ago  . Alcohol use No     Comment: Quit 25 yrs ago  . Drug use: No  . Sexual activity: Not on file   Other Topics Concern  . Not on file   Social History Narrative    Lives at home w/ his wife   Right-handed   Caffeine: 3-6 cups of coffee per day        PHYSICAL EXAM   Vitals:   03/27/17 0903  BP: 132/71  Pulse: (!) 48  Weight: 213 lb 12 oz (97 kg)  Height: 5\' 11"  (1.803 m)    Not recorded      Body mass index is 29.81 kg/m.  PHYSICAL EXAMNIATION:  Gen: NAD, conversant, well nourised, obese, well groomed                     Cardiovascular: Regular rate rhythm, no peripheral edema, warm, nontender. Eyes: Conjunctivae clear without exudates or hemorrhage Neck: Supple, no carotid bruits. Pulmonary: Clear to auscultation bilaterally   NEUROLOGICAL EXAM:  MENTAL STATUS: Speech:    Speech is normal; fluent and spontaneous with normal comprehension.  Cognition:     Orientation to time, place and person     Normal recent and remote memory     Normal Attention span and concentration     Normal Language, naming, repeating,spontaneous speech     Fund of knowledge   CRANIAL NERVES: CN II: Visual fields are full to confrontation. Fundoscopic exam is normal with sharp discs and no vascular changes. Pupils are round equal and briskly reactive to light. CN III, IV, VI: extraocular movement are normal. Cover and uncover testing showed mild bilateral exophoria, red lens testing showed mild weakness at right upper and lower gaze muscles, CN V: Facial sensation is intact to pinprick in all 3 divisions bilaterally. Corneal responses are intact.  CN VII: Face is symmetric, he has mild bilateral eye closure cheek puff muscle weakness. Fatigable bilateral ptosis, left worse than right CN VIII: Hearing is normal to rubbing fingers CN IX, X: Palate elevates symmetrically. Phonation is normal. CN XI: Head turning and shoulder shrug are intact CN XII: Tongue is midline with normal movements and no atrophy.  MOTOR: He has no neck flexion extension weakness, mild bilateral shoulder abduction, hip flexion weakness  REFLEXES: Reflexes are 2+ and symmetric  at the biceps, triceps, knees, and ankles. Plantar responses are flexor.  SENSORY: Intact to light touch, pinprick, positional sensation and vibratory sensation are intact in fingers and toes.  COORDINATION: Rapid alternating movements and fine finger movements are intact. There is no dysmetria on finger-to-nose and heel-knee-shin.    GAIT/STANCE: Posture is normal. Gait is steady with normal steps, base, arm swing, and turning. Heel and toe walking are normal. Tandem gait is normal.  Romberg is absent.   DIAGNOSTIC DATA (LABS, IMAGING, TESTING) - I reviewed patient records, labs, notes, testing and imaging myself where available.   ASSESSMENT AND PLAN  Tudor Chandley is a 71 y.o.  male   Seropositive generalized myasthenia gravis,  He was noted to have mild bulbar, proximal limb muscle weakness,  Positive acetylcholine binding antibody  Responded well to Mestinon 60 mg 3 times a day  Not a good candidate for long-term prednisone treatment due to diabetes,  We will proceed with CellCept 500 mg twice a day, potential side effect explained  Return to clinic in 2-3 months   Marcial Pacas, M.D. Ph.D.  [facility] [address], [place], [place] [address] Ph: [phone] Fax: [phone]  CC: Referring Provider

## 2017-03-31 ENCOUNTER — Telehealth: Payer: Self-pay | Admitting: Neurology

## 2017-03-31 NOTE — Telephone Encounter (Signed)
Patient presented to outside ED, he has URI and acute weakness worrisome for myasthenic exacerbation. [facility] as [facility] not have neurologist. Has not started Cellcept yet. I informed Dr. Leonel Ramsay neurohospitalist.

## 2017-04-01 ENCOUNTER — Inpatient Hospital Stay (HOSPITAL_COMMUNITY)
Admission: EM | Admit: 2017-04-01 | Discharge: 2017-04-05 | DRG: 057 | Disposition: A | Discharge status: Home / Self Care | Payer: Medicare Other | Source: Other Acute Inpatient Hospital | Attending: Family Medicine | Admitting: Family Medicine

## 2017-04-01 ENCOUNTER — Encounter (HOSPITAL_COMMUNITY): Payer: Self-pay

## 2017-04-01 ENCOUNTER — Inpatient Hospital Stay (HOSPITAL_COMMUNITY): Payer: Medicare Other

## 2017-04-01 DIAGNOSIS — G7000 Myasthenia gravis without (acute) exacerbation: Secondary | ICD-10-CM | POA: Diagnosis not present

## 2017-04-01 DIAGNOSIS — E7800 Pure hypercholesterolemia, unspecified: Secondary | ICD-10-CM | POA: Diagnosis present

## 2017-04-01 DIAGNOSIS — Z79899 Other long term (current) drug therapy: Secondary | ICD-10-CM | POA: Diagnosis not present

## 2017-04-01 DIAGNOSIS — J069 Acute upper respiratory infection, unspecified: Secondary | ICD-10-CM | POA: Diagnosis present

## 2017-04-01 DIAGNOSIS — E785 Hyperlipidemia, unspecified: Secondary | ICD-10-CM | POA: Diagnosis present

## 2017-04-01 DIAGNOSIS — Z87891 Personal history of nicotine dependence: Secondary | ICD-10-CM

## 2017-04-01 DIAGNOSIS — M545 Low back pain: Secondary | ICD-10-CM | POA: Diagnosis present

## 2017-04-01 DIAGNOSIS — G8929 Other chronic pain: Secondary | ICD-10-CM | POA: Diagnosis present

## 2017-04-01 DIAGNOSIS — Z7982 Long term (current) use of aspirin: Secondary | ICD-10-CM

## 2017-04-01 DIAGNOSIS — I10 Essential (primary) hypertension: Secondary | ICD-10-CM | POA: Diagnosis present

## 2017-04-01 DIAGNOSIS — R05 Cough: Secondary | ICD-10-CM | POA: Diagnosis present

## 2017-04-01 DIAGNOSIS — M549 Dorsalgia, unspecified: Secondary | ICD-10-CM | POA: Diagnosis not present

## 2017-04-01 DIAGNOSIS — Z794 Long term (current) use of insulin: Secondary | ICD-10-CM | POA: Diagnosis not present

## 2017-04-01 DIAGNOSIS — E119 Type 2 diabetes mellitus without complications: Secondary | ICD-10-CM | POA: Diagnosis present

## 2017-04-01 DIAGNOSIS — E118 Type 2 diabetes mellitus with unspecified complications: Secondary | ICD-10-CM

## 2017-04-01 DIAGNOSIS — Z85828 Personal history of other malignant neoplasm of skin: Secondary | ICD-10-CM | POA: Diagnosis not present

## 2017-04-01 DIAGNOSIS — G7001 Myasthenia gravis with (acute) exacerbation: Principal | ICD-10-CM | POA: Diagnosis present

## 2017-04-01 DIAGNOSIS — R059 Cough, unspecified: Secondary | ICD-10-CM

## 2017-04-01 HISTORY — DX: Myasthenia gravis without (acute) exacerbation: G70.00

## 2017-04-01 LAB — RESPIRATORY PANEL BY PCR
ADENOVIRUS-RVPPCR: NOT DETECTED
Bordetella pertussis: NOT DETECTED
CHLAMYDOPHILA PNEUMONIAE-RVPPCR: NOT DETECTED
CORONAVIRUS NL63-RVPPCR: NOT DETECTED
Coronavirus 229E: NOT DETECTED
Coronavirus HKU1: NOT DETECTED
Coronavirus OC43: NOT DETECTED
INFLUENZA A-RVPPCR: NOT DETECTED
Influenza B: NOT DETECTED
Metapneumovirus: NOT DETECTED
Mycoplasma pneumoniae: NOT DETECTED
PARAINFLUENZA VIRUS 1-RVPPCR: NOT DETECTED
PARAINFLUENZA VIRUS 3-RVPPCR: NOT DETECTED
PARAINFLUENZA VIRUS 4-RVPPCR: NOT DETECTED
Parainfluenza Virus 2: NOT DETECTED
RHINOVIRUS / ENTEROVIRUS - RVPPCR: DETECTED — AB
Respiratory Syncytial Virus: NOT DETECTED

## 2017-04-01 LAB — COMPREHENSIVE METABOLIC PANEL
ALBUMIN: 3.7 g/dL (ref 3.5–5.0)
ALT: 24 U/L (ref 17–63)
AST: 26 U/L (ref 15–41)
Alkaline Phosphatase: 45 U/L (ref 38–126)
Anion gap: 10 (ref 5–15)
BUN: 14 mg/dL (ref 6–20)
CHLORIDE: 103 mmol/L (ref 101–111)
CO2: 27 mmol/L (ref 22–32)
Calcium: 9.2 mg/dL (ref 8.9–10.3)
Creatinine, Ser: 1.14 mg/dL (ref 0.61–1.24)
GFR calc Af Amer: >60 mL/min (ref >60)
Glucose, Bld: 144 mg/dL — ABNORMAL HIGH (ref 65–99)
POTASSIUM: 3.1 mmol/L — AB (ref 3.5–5.1)
SODIUM: 140 mmol/L (ref 135–145)
Total Bilirubin: 0.6 mg/dL (ref 0.3–1.2)
Total Protein: 7 g/dL (ref 6.5–8.1)

## 2017-04-01 LAB — GLUCOSE, CAPILLARY
GLUCOSE-CAPILLARY: 160 mg/dL — AB (ref 65–99)
GLUCOSE-CAPILLARY: 202 mg/dL — AB (ref 65–99)
GLUCOSE-CAPILLARY: 181 mg/dL — AB (ref 65–99)
Glucose-Capillary: 188 mg/dL — ABNORMAL HIGH (ref 65–99)

## 2017-04-01 LAB — CBC WITH DIFFERENTIAL/PLATELET
BASOS ABS: 0 10*3/uL (ref 0.0–0.1)
BASOS PCT: 0 %
EOS ABS: 0 10*3/uL (ref 0.0–0.7)
EOS PCT: 0 %
HCT: 39.7 % (ref 39.0–52.0)
Hemoglobin: 13 g/dL (ref 13.0–17.0)
Lymphocytes Relative: 15 %
Lymphs Abs: 1.2 10*3/uL (ref 0.7–4.0)
MCH: 28.3 pg (ref 26.0–34.0)
MCHC: 32.7 g/dL (ref 30.0–36.0)
MCV: 86.5 fL (ref 78.0–100.0)
MONO ABS: 0.5 10*3/uL (ref 0.1–1.0)
Monocytes Relative: 6 %
Neutro Abs: 6.2 10*3/uL (ref 1.7–7.7)
Neutrophils Relative %: 79 %
PLATELETS: 166 10*3/uL (ref 150–400)
RBC: 4.59 MIL/uL (ref 4.22–5.81)
RDW: 14.2 % (ref 11.5–15.5)
WBC: 7.8 10*3/uL (ref 4.0–10.5)

## 2017-04-01 LAB — LIPID PANEL
Cholesterol: 151 mg/dL (ref 0–200)
HDL: 48 mg/dL
LDL Cholesterol: 87 mg/dL (ref 0–99)
Total CHOL/HDL Ratio: 3.1 ratio
Triglycerides: 82 mg/dL
VLDL: 16 mg/dL (ref 0–40)

## 2017-04-01 LAB — URINALYSIS, ROUTINE W REFLEX MICROSCOPIC
Bilirubin Urine: NEGATIVE
Glucose, UA: NEGATIVE mg/dL
HGB URINE DIPSTICK: NEGATIVE
Ketones, ur: NEGATIVE mg/dL
Leukocytes, UA: NEGATIVE
Nitrite: NEGATIVE
Protein, ur: NEGATIVE mg/dL
SPECIFIC GRAVITY, URINE: 1.011 (ref 1.005–1.030)
pH: 6 (ref 5.0–8.0)

## 2017-04-01 LAB — RAPID STREP SCREEN (MED CTR MEBANE ONLY): Streptococcus, Group A Screen (Direct): NEGATIVE

## 2017-04-01 LAB — HEMOGLOBIN A1C
Hgb A1c MFr Bld: 7.4 % — ABNORMAL HIGH (ref 4.8–5.6)
MEAN PLASMA GLUCOSE: 165.68 mg/dL

## 2017-04-01 MED ORDER — HYDRALAZINE HCL 20 MG/ML IJ SOLN: 5.0000 mg | INTRAMUSCULAR | Status: DC | PRN

## 2017-04-01 MED ORDER — OMEGA-3-ACID ETHYL ESTERS 1 G PO CAPS
1.0000 g | ORAL_CAPSULE | Freq: Every day | ORAL | Status: DC
Start: 2017-04-01 — End: 2017-04-05
  Administered 2017-04-01 – 2017-04-05 (×5): 1 g via ORAL
  Filled 2017-04-01 (×5): qty 1

## 2017-04-01 MED ORDER — ASPIRIN 81 MG PO CHEW
81.0000 mg | CHEWABLE_TABLET | Freq: Every day | ORAL | Status: DC
  Administered 2017-04-01 – 2017-04-05 (×5): 81 mg via ORAL
  Filled 2017-04-01 (×5): qty 1

## 2017-04-01 MED ORDER — HYDROCODONE-ACETAMINOPHEN 5-325 MG PO TABS
1.0000 | ORAL_TABLET | ORAL | Status: DC | PRN
  Administered 2017-04-01: 1 via ORAL
  Filled 2017-04-01: qty 1

## 2017-04-01 MED ORDER — IBUPROFEN 400 MG PO TABS
200.0000 mg | ORAL_TABLET | Freq: Every day | ORAL | Status: DC
  Administered 2017-04-01 – 2017-04-04 (×4): 200 mg via ORAL
  Filled 2017-04-01 (×4): qty 1

## 2017-04-01 MED ORDER — LOSARTAN POTASSIUM 50 MG PO TABS
25.0000 mg | ORAL_TABLET | Freq: Every day | ORAL | Status: DC
  Administered 2017-04-01 – 2017-04-05 (×4): 25 mg via ORAL
  Filled 2017-04-01 (×5): qty 1

## 2017-04-01 MED ORDER — ALBUTEROL SULFATE (2.5 MG/3ML) 0.083% IN NEBU: 2.5000 mg | INHALATION_SOLUTION | RESPIRATORY_TRACT | Status: DC | PRN

## 2017-04-01 MED ORDER — DIPHENHYDRAMINE HCL 25 MG PO CAPS
25.0000 mg | ORAL_CAPSULE | Freq: Every day | ORAL | Status: DC
  Administered 2017-04-01 – 2017-04-04 (×4): 25 mg via ORAL
  Filled 2017-04-01 (×4): qty 1

## 2017-04-01 MED ORDER — ATORVASTATIN CALCIUM 20 MG PO TABS
20.0000 mg | ORAL_TABLET | Freq: Every day | ORAL | Status: DC
  Administered 2017-04-01 – 2017-04-04 (×4): 20 mg via ORAL
  Filled 2017-04-01 (×4): qty 1

## 2017-04-01 MED ORDER — VITAMIN D 1000 UNITS PO TABS
1000.0000 [IU] | ORAL_TABLET | Freq: Every day | ORAL | Status: DC
  Administered 2017-04-01 – 2017-04-04 (×4): 1000 [IU] via ORAL
  Filled 2017-04-01 (×4): qty 1

## 2017-04-01 MED ORDER — ENOXAPARIN SODIUM 40 MG/0.4ML ~~LOC~~ SOLN
40.0000 mg | SUBCUTANEOUS | Status: DC
  Administered 2017-04-01 – 2017-04-05 (×5): 40 mg via SUBCUTANEOUS
  Filled 2017-04-01 (×5): qty 0.4

## 2017-04-01 MED ORDER — INSULIN ASPART 100 UNIT/ML ~~LOC~~ SOLN
0.0000 [IU] | Freq: Three times a day (TID) | SUBCUTANEOUS | Status: DC
  Administered 2017-04-01 (×2): 2 [IU] via SUBCUTANEOUS
  Administered 2017-04-01 – 2017-04-02 (×2): 3 [IU] via SUBCUTANEOUS
  Administered 2017-04-02: 1 [IU] via SUBCUTANEOUS
  Administered 2017-04-03: 2 [IU] via SUBCUTANEOUS
  Administered 2017-04-03: 3 [IU] via SUBCUTANEOUS
  Administered 2017-04-03 – 2017-04-04 (×2): 1 [IU] via SUBCUTANEOUS
  Administered 2017-04-04: 2 [IU] via SUBCUTANEOUS
  Administered 2017-04-04: 3 [IU] via SUBCUTANEOUS
  Administered 2017-04-05 (×2): 2 [IU] via SUBCUTANEOUS

## 2017-04-01 MED ORDER — HYDROCHLOROTHIAZIDE 25 MG PO TABS
25.0000 mg | ORAL_TABLET | Freq: Every day | ORAL | Status: DC
  Administered 2017-04-01: 25 mg via ORAL
  Filled 2017-04-01: qty 1

## 2017-04-01 MED ORDER — DM-GUAIFENESIN ER 30-600 MG PO TB12
1.0000 | ORAL_TABLET | Freq: Two times a day (BID) | ORAL | Status: DC
  Administered 2017-04-01 – 2017-04-05 (×10): 1 via ORAL
  Filled 2017-04-01 (×10): qty 1

## 2017-04-01 MED ORDER — IMMUNE GLOBULIN (HUMAN) 10 GM/100ML IV SOLN
400.0000 mg/kg | INTRAVENOUS | Status: AC
  Administered 2017-04-01 – 2017-04-05 (×5): 40 g via INTRAVENOUS
  Filled 2017-04-01 (×7): qty 400

## 2017-04-01 MED ORDER — POTASSIUM CHLORIDE CRYS ER 20 MEQ PO TBCR
40.0000 meq | EXTENDED_RELEASE_TABLET | Freq: Two times a day (BID) | ORAL | Status: AC
  Administered 2017-04-01 (×2): 40 meq via ORAL
  Filled 2017-04-01 (×2): qty 2

## 2017-04-01 MED ORDER — IBUPROFEN-DIPHENHYDRAMINE HCL 200-25 MG PO CAPS: ORAL_CAPSULE | Freq: Every day | ORAL | Status: DC

## 2017-04-01 MED ORDER — ACETAMINOPHEN 650 MG RE SUPP: 650.0000 mg | Freq: Four times a day (QID) | RECTAL | Status: DC | PRN

## 2017-04-01 MED ORDER — INSULIN GLARGINE 100 UNIT/ML ~~LOC~~ SOLN
20.0000 [IU] | Freq: Every day | SUBCUTANEOUS | Status: DC
  Administered 2017-04-01 – 2017-04-05 (×5): 20 [IU] via SUBCUTANEOUS
  Filled 2017-04-01 (×6): qty 0.2

## 2017-04-01 MED ORDER — ONDANSETRON HCL 4 MG/2ML IJ SOLN
4.0000 mg | Freq: Four times a day (QID) | INTRAMUSCULAR | Status: DC | PRN
  Administered 2017-04-01: 4 mg via INTRAVENOUS
  Filled 2017-04-01: qty 2

## 2017-04-01 MED ORDER — ZOLPIDEM TARTRATE 5 MG PO TABS: 5.0000 mg | ORAL_TABLET | Freq: Every evening | ORAL | Status: DC | PRN

## 2017-04-01 MED ORDER — ATENOLOL 50 MG PO TABS
50.0000 mg | ORAL_TABLET | Freq: Every day | ORAL | Status: DC
Start: 2017-04-01 — End: 2017-04-05
  Administered 2017-04-01 – 2017-04-05 (×4): 50 mg via ORAL
  Filled 2017-04-01 (×5): qty 1

## 2017-04-01 MED ORDER — ACETAMINOPHEN 325 MG PO TABS
650.0000 mg | ORAL_TABLET | Freq: Four times a day (QID) | ORAL | Status: DC | PRN
  Administered 2017-04-02 – 2017-04-05 (×4): 650 mg via ORAL
  Filled 2017-04-01 (×4): qty 2

## 2017-04-01 MED ORDER — ONDANSETRON HCL 4 MG PO TABS: 4.0000 mg | ORAL_TABLET | Freq: Four times a day (QID) | ORAL | Status: DC | PRN

## 2017-04-01 MED ORDER — AMLODIPINE BESYLATE 10 MG PO TABS
10.0000 mg | ORAL_TABLET | Freq: Every day | ORAL | Status: DC
  Administered 2017-04-01 – 2017-04-05 (×4): 10 mg via ORAL
  Filled 2017-04-01 (×5): qty 1

## 2017-04-01 MED ORDER — POTASSIUM CHLORIDE 20 MEQ/15ML (10%) PO SOLN
40.0000 meq | Freq: Once | ORAL | Status: AC
  Administered 2017-04-01: 40 meq via ORAL
  Filled 2017-04-01: qty 30

## 2017-04-01 MED ORDER — PYRIDOSTIGMINE BROMIDE 60 MG PO TABS
60.0000 mg | ORAL_TABLET | Freq: Three times a day (TID) | ORAL | Status: DC
  Administered 2017-04-01 – 2017-04-05 (×13): 60 mg via ORAL
  Filled 2017-04-01 (×15): qty 1

## 2017-04-01 NOTE — Progress Notes (Signed)
Pt needed help to attempt to go to the restroom. Pt required maximum assist to sit on side of bed. Pt became nauseated and vomited. Pt seemed to be more confused than earlier. MD made aware, MD came and assessed pt. MD said to hold IVIG and neuro will consult. Will continue to assess.

## 2017-04-01 NOTE — Progress Notes (Signed)
IVIG started via PIV in RT forearm.   Went over the titration instructions with primary RN, Ellard Artis.  I also went over S/S of reaction to IVIG with pt and family at bedside.   Titration rates written out and taped to IV pole after going over them with Ellard Artis, RN.  Pt verbalized understanding of the process.

## 2017-04-01 NOTE — Procedures (Signed)
VC 1.2L, NIF -40, good pt effort.

## 2017-04-01 NOTE — Consult Note (Addendum)
Neurology Consultation Reason for Consult: Generalized weakness Referring Physician: Mora Bellman  CC: Generalized weakness  History is obtained from: Patient  HPI: Bob Warren is a 71 y.o. male with a history of myasthenia gravis recently diagnosed by positive acetylcholine receptor antibodies presents with worsening generalized weakness in the setting of mild URI.  He was diagnosed with myasthenia gravis due to ptosis and generalized weakness.  He denies diplopia, dysphagia but does endorse some mild shortness of breath getting worse over the past couple of days. He had a negative inspiratory force checked at the outside hospital which was apparently -40.  He complains of fever, sore throat, and generalized malaise.  ROS: A 14 point ROS was performed and is negative except as noted in the HPI.   Past Medical History:  Diagnosis Date  . Diabetes ([place])   . High cholesterol   . Hypertension   . Myasthenia gravis ([place])      Family History  Problem Relation Age of Onset  . Cancer Mother   . Other Father        Gunshot wound  . Diabetes Maternal Grandfather      Social History:  reports that he has quit smoking. He has never used smokeless tobacco. He reports that he does not drink alcohol or use drugs.   Exam: Current vital signs: There were no vitals taken for this visit. Vital signs in last 24 hours:     Physical Exam  Constitutional: Appears well-developed and well-nourished.  Psych: Affect appropriate to situation Eyes: No scleral injection HENT: No OP obstrucion Head: Normocephalic.  Cardiovascular: Normal rate and regular rhythm.  Respiratory: Effort normal and breath sounds normal to anterior ascultation GI: Soft.  No distension. There is no tenderness.  Skin: WDI  Neuro: Mental Status: Patient is awake, alert, oriented to person, place, month, year, and situation. Patient is able to give a clear and coherent history. No signs of aphasia or  neglect Cranial Nerves: II: Visual Fields are full.  III,IV, VI: EOMI. With sustained upgaze, he appears to have some mild worsening of his right eyelid ptosis. V: Facial sensation is symmetric to temperature VII: Facial movement is symmetric.  VIII: hearing is intact to voice X: Uvula elevates symmetrically XI: Shoulder shrug is symmetric. XII: tongue is midline without atrophy or fasciculations.  Motor: Tone is normal. Bulk is normal. 5/5 strength was present in all four extremities, but with fatigability in the legs Sensory: Sensation is symmetric to light touch and temperature in the arms and legs. Deep Tendon Reflexes: 2+ and symmetric in the biceps and patellae.  Cerebellar: FNF intact bilaterally      I have reviewed labs in epic and the results pertinent to this consultation are: CMP-7/27 -normal creatinine.   Impression: 71 year old male with what I suspect is a myasthenic exacerbation in the setting of upper respiratory infection. He has been started on IVIG.  Recommendations: 1) NIF/VC Q8H 2) IVIG 435m/kg daily for 5 days 3) continue mestinon 663mTID.  4) will follow.     McRoland RackMD [facility] [phone] 7pm- 7am, please page neurology on call as listed in [place]

## 2017-04-01 NOTE — Progress Notes (Signed)
Pt receiving IVIG. Pt began shivering. MD made aware and IVIG stopped. MD said to wait and see if it resolves on its on. Will continue to assess.

## 2017-04-01 NOTE — Progress Notes (Signed)
Pt is feeling better. No longer shivering or complaining of being cold. MD wants to restart IVIG in 30 min. IV team notified. Will continue to assess.

## 2017-04-01 NOTE — H&P (Addendum)
History and Physical    Bob Warren DTO:[id number] DOB: [birth year] DOA: 04/01/2017  Referring MD/NP/PA:   PCP: [name], MD   Patient coming from:  The patient is coming from home.  At baseline, pt is independent for most of ADL.   Chief Complaint: Cough, fever, runny nose, sore throat, generalized weakness  HPI: Bob Warren is a 71 y.o. male with medical history significant of hypertension, hyperlipidemia, diabetes mellitus, myasthenia gravis gravis, chronic back pain, who presents with cough, fever, runny nose, sore throat, generalized weakness.  Patient states that he has been having cold-like symptoms in the past 2 or 3 days, including cough, fever, runny nose, sore throat, headache. He had temperature 101 at home at one time. Patient states that he has mild shortness of breath and dry cough. Denies any chest pain. No nausea, vomiting, diarrhea, abdominal pain. He reports that sometimes he has burning on urination and increased urinary frequency, but no dysuria. Patient states that he has increased generalized weakness, but no unilateral weakness, numbness or tingling in extremities. No facial droop, slurred speech, vision change or hearing loss. Patient states that his chronic lower back pain has also worsened.  Pt was seen in [facility] and was found have negative inspiratory force of -40, concerning for myasthenic exacerbation and therefore transferred to [facility].  Dr. Balinda Quails informed Dr. Leonel Ramsay neurohospitalist. Pt has not started Cellcept per Dr. Alvira Philips Ahern's note.   Review of Systems:   General: has fevers, chills, no body weight gain,  has fatigue and HA HEENT: no blurry vision, hearing changes.  Has sore throat and running nose. Respiratory: has mild dyspnea, coughing, no wheezing CV: no chest pain, no palpitations GI: no nausea, vomiting, abdominal pain, diarrhea, constipation GU: no dysuria, has burning on urination, increased urinary frequency, no  hematuria  Ext: no leg edema Neuro: no unilateral weakness, numbness, or tingling, no vision change or hearing loss Skin: no rash, no skin tear. MSK: No muscle spasm, no deformity, no limitation of range of movement in spin. Has lower back pain. Heme: No easy bruising.  Travel history: No recent long distant travel.  Allergy:  Allergies  Allergen Reactions  . Lisinopril     Other reaction(s): Other (See Comments) Cough    Past Medical History:  Diagnosis Date  . Diabetes ([place])   . High cholesterol   . Hypertension   . Myasthenia gravis [facility])     Past Surgical History:  Procedure Laterality Date  . SKIN CANCER EXCISION Right 2016   Arm    Social History:  reports that he has quit smoking. He has never used smokeless tobacco. He reports that he does not drink alcohol or use drugs.  Family History:  Family History  Problem Relation Age of Onset  . Cancer Mother   . Other Father        Gunshot wound  . Diabetes Maternal Grandfather      Prior to Admission medications   Medication Sig Start Date End Date Taking? Authorizing Provider  amLODipine (NORVASC) 10 MG tablet Take 10 mg by mouth.    [provider]  aspirin 81 MG chewable tablet Chew 81 mg by mouth.    [provider]  atenolol (TENORMIN) 50 MG tablet Take 50 mg by mouth.    [provider]  atorvastatin (LIPITOR) 20 MG tablet Take 20 mg by mouth.    [provider]  Cholecalciferol (VITAMIN D3) 1000 units CAPS Take by mouth.    [provider]  hydrochlorothiazide (HYDRODIURIL) 25 MG tablet Take 25 mg by mouth.    [provider]  Ibuprofen-Diphenhydramine Cit (ADVIL PM PO) Take 2 capsules by mouth at bedtime.    [provider]  Insulin Glargine (LANTUS [place]) Inject 28 Units into the skin daily.    [provider]  losartan (COZAAR) 25 MG tablet Take 25 mg by mouth. 05/22/15   [provider]  metFORMIN (GLUCOPHAGE) 850 MG tablet  Take 850 mg by mouth.    [provider]  mycophenolate (CELLCEPT) 500 MG tablet Take 1 tablet (500 mg total) by mouth 2 (two) times daily. 03/27/17   Marcial Pacas, MD  Omega-3 Fatty Acids (FISH OIL) 1000 MG CAPS Take 2 capsules by mouth daily.    [provider]  pyridostigmine (MESTINON) 60 MG tablet Take 1 tablet (60 mg total) by mouth 3 (three) times daily. 02/21/17   Melvenia Beam, MD    Physical Exam: There were no vitals filed for this visit. General: Not in acute distress HEENT:       Eyes: PERRL, EOMI, no scleral icterus.       ENT: No discharge from the ears and nose, no pharynx injection, no tonsillar enlargement.        Neck: No JVD, no bruit, no mass felt. Heme: No neck lymph node enlargement. Cardiac: S1/S2, RRR, No murmurs, No gallops or rubs. Respiratory: No rales, wheezing, rhonchi or rubs. GI: Soft, nondistended, nontender, no rebound pain, no organomegaly, BS present. GU: No hematuria Ext: No pitting leg edema bilaterally. 2+DP/PT pulse bilaterally. Musculoskeletal: No joint deformities, No joint redness or warmth, no limitation of ROM in spin. Skin: No rashes.  Neuro: Alert, oriented X3, cranial nerves II-XII grossly intact, moves all extremities normally. Muscle strength 4/5 in all extremities, sensation to light touch intact. Brachial reflex 2+ bilaterally. Negative Babinski's sign.  Psych: Patient is not psychotic, no suicidal or hemocidal ideation.  Labs on Admission: I have personally reviewed following labs and imaging studies  CBC: No results for input(s): WBC, NEUTROABS, HGB, HCT, MCV, PLT in the last 168 hours. Basic Metabolic Panel: No results for input(s): NA, K, CL, CO2, GLUCOSE, BUN, CREATININE, CALCIUM, MG, PHOS in the last 168 hours. GFR: CrCl cannot be calculated (Patient's most recent lab result is older than the maximum 21 days allowed.). Liver Function Tests: No results for input(s): AST, ALT, ALKPHOS, BILITOT, PROT, ALBUMIN in  the last 168 hours. No results for input(s): LIPASE, AMYLASE in the last 168 hours. No results for input(s): AMMONIA in the last 168 hours. Coagulation Profile: No results for input(s): INR, PROTIME in the last 168 hours. Cardiac Enzymes: No results for input(s): CKTOTAL, CKMB, CKMBINDEX, TROPONINI in the last 168 hours. BNP (last 3 results) No results for input(s): PROBNP in the last 8760 hours. HbA1C: No results for input(s): HGBA1C in the last 72 hours. CBG: No results for input(s): GLUCAP in the last 168 hours. Lipid Profile: No results for input(s): CHOL, HDL, LDLCALC, TRIG, CHOLHDL, LDLDIRECT in the last 72 hours. Thyroid Function Tests: No results for input(s): TSH, T4TOTAL, FREET4, T3FREE, THYROIDAB in the last 72 hours. Anemia Panel: No results for input(s): VITAMINB12, FOLATE, FERRITIN, TIBC, IRON, RETICCTPCT in the last 72 hours. Urine analysis: No results found for: COLORURINE, APPEARANCEUR, LABSPEC, PHURINE, GLUCOSEU, HGBUR, BILIRUBINUR, KETONESUR, PROTEINUR, UROBILINOGEN, NITRITE, LEUKOCYTESUR Sepsis Labs: @LABRCNTIP (procalcitonin:4,lacticidven:4) )No results found for this or any previous visit (from the past 240 hour(s)).   Radiological Exams on Admission: No  results found.   EKG:   Not done in ED, will get one.   Assessment/Plan Principal Problem:   Myasthenia gravis ([place]) Active Problems:   DM (diabetes mellitus) ([place])   Hypertension   HLD (hyperlipidemia)   URI (upper respiratory infection)   Chronic back pain   Myasthenia gravis [facility]): Patient is currently taking Mestinon. He has increased weakness and mild SOB. His negative inspiratory force was -40, indicating possible MG exacerbation. Likely triggered by URI. Will also need to r/o UTI since pt has some symptoms of UTI. Now his O2 sat is 92% on room air. No respiratory distress. Neurology, Dr. Leonel Ramsay was consulted.  -will admit to Med-surg bed for obs -continue Mestinon -Follow-up neurologist  recommendations. -will get vital capacity -will get CBC, CMP  URI (upper respiratory infection):  -will get CXR -check Rapid strep -Get respiratory virus panel -prn albuterol nebulizers -When necessary Mucinex for cough  DM (diabetes mellitus) without complications: Last Z3Y not on record. Patient is taking Lantus, metformin at home -will decrease Lantus dose from 28-20 units daily  -SSI -Check A1c  Hypertension: -Continue home amlodipine, atenolol, HCTZ, Cozaar  HLD (hyperlipidemia): -lipitor  Burning on urination and increased urinary frequency: -Follow up urine analysis and urine culture  Chronic back pain: -When necessary Norco    DVT ppx: SQ Lovenox Code Status: Full code Family Communication: Yes, patient's wife and granddaughter   at bed side Disposition Plan:  Anticipate discharge back to previous home environment Consults called:  Neurology, Dr. Leonel Ramsay Admission status: medical floor/inpt       Date of Service 04/01/2017    Jenene Kauffmann, [place] [facility] Pager [phone]  If 7PM-7AM, please contact night-coverage [email or web address] Password [email or web address] 04/01/2017, 3:59 AM

## 2017-04-01 NOTE — Progress Notes (Signed)
IVIG restarted on MD Akula's verbal order. Will continue to assess pt.

## 2017-04-01 NOTE — Progress Notes (Signed)
Bob Warren is a 71 y.o. male with medical history significant of hypertension, hyperlipidemia, diabetes mellitus, myasthenia gravis gravis, chronic back pain, who presents with cough, fever, runny nose, sore throat, generalized weakness. He was admitted for MG exacerbation. Neurology consulted and recommended IVIG.    Plan: 1. IVIg 2. Continue with Mestinon 3. Neurology follow up .   Hosie Poisson, MD [phone]

## 2017-04-01 NOTE — Progress Notes (Signed)
Patient did NIF -30 and FVC 1.2L with good effort.

## 2017-04-02 ENCOUNTER — Ambulatory Visit: Payer: Self-pay | Admitting: Neurology

## 2017-04-02 LAB — URINE CULTURE

## 2017-04-02 LAB — GLUCOSE, CAPILLARY
GLUCOSE-CAPILLARY: 117 mg/dL — AB (ref 65–99)
GLUCOSE-CAPILLARY: 176 mg/dL — AB (ref 65–99)
Glucose-Capillary: 124 mg/dL — ABNORMAL HIGH (ref 65–99)
Glucose-Capillary: 204 mg/dL — ABNORMAL HIGH (ref 65–99)

## 2017-04-02 NOTE — Progress Notes (Signed)
Pt. BP 107/68, and pulse 51. Holding atenolol, amlodipine and losartan. Will continue to monitor.

## 2017-04-02 NOTE — Progress Notes (Signed)
Subjective: Feels better in terms of strength. No new complaints.  Exam: Vitals:   04/02/17 1147 04/02/17 1200  BP: 110/60 132/71  Pulse: (!) 49 (!) 49  Resp: 18 18  Temp: 99.1 F (37.3 C) 98.1 F (36.7 C)  SpO2: 99% 97%  GENERAL: Awake, alert in NAD HEENT: - Normocephalic and atraumatic, dry mm, no LN++, no Thyromegally LUNGS - Clear to auscultation bilaterally with no wheezes CV - S1S2 RRR, no m/r/g, equal pulses bilaterally. ABDOMEN - Soft, nontender, nondistended with normoactive BS  NEURO:  Mental Status: AA&Ox3 Language: speech is clear.  Naming, repetition, fluency, and comprehension intact. Cranial Nerves: PERRL 81m/brisk. EOMI, no diplopia on sustained up gaze, visual fields full, no facial asymmetry, facial sensation intact, hearing intact, tongue/uvula/soft palate midline, normal sternocleidomastoid and trapezius muscle strength. No evidence of tongue atrophy or fibrillations Motor:5/5 with mild leg fatiguability/. Tone: is normal and bulk is normal Sensation- Intact to light touch bilaterally Coordination: FTN intact bilaterally, no ataxia in BLE. Gait- deferred  Impression:  71/M with myasthenic exacerbation in the setting of a URI. NIF -30, FVC 1.2L  Recommendations: -c/w IVIG x5 days total -NIF/FVC - q8h -c/w Mestinon 60 TID -outpatient neurology follow up in 2-3 weeks of discharge.  We will be available as needed. Please call with questions.  AAmie Portland MD [facility] [phone] If 7pm to 7am, please call on call as listed on AMION.

## 2017-04-02 NOTE — Progress Notes (Signed)
PROGRESS NOTE [facility]   Tonnie Friedel   JQB:[id number] DOB: 13-Apr-1946  DOA: 04/01/2017 PCP: [name], MD   Brief Narrative:  Bob Warren is a 71 year old male with a history of hypertension, hyperlipidemia, diabetes, myasthenia gravis, and chronic low back pain. He was recently diagnosed with myasthenia gravis one month ago after presenting to his PCP with left eye droop.   Subjective: Mr. Paparella presented to the ER with cold symptoms that had been going on for 2-3 days, and he woke from sleep Monday morning and couldn't get out of bed secondary to muscle weakness. During his stay in the hospital he has been receiving Mucinex for the cold with improvement in his symptoms. He was started on IVIG Monday and had some reaction (shivering, nausea, and confusion) but tolerated continued treatment yesterday well. He has much improvement in his mobility today as he needed assistance ambulating and going to the bathroom initially and can ambulate unassisted today. Denies difficulty swallowing or chewing, shortness of breath, chest pain, dizziness, or nausea. He admits to continue eye droop, but feels his strength has improved.  Assessment & Plan: Myasthenia gravis ([place]) - Exacerbation likely secondary to URI -Continue pyridostigmine 60mg  TID and IVIG infusion for 2 more day URI (upper respiratory infection) - Respiratory panel positive for Rhinovirus/Enterovirus. Strep negative.  - Throat culture and blood cultures still pending.  - Continue Mucinex PRN. Chronic back pain - Could be contributing to some lower extremity weakness. Continue current medications. DM (diabetes mellitus) ([place]) - Stable. HgbA1C 7.4 - Continue insulin as directed. Hypertension - Well controlled. Continue amlodipine, atenolol.  HLD (hyperlipidemia) - Continue statin. Heart healthy diet recommended.    DVT prophylaxis: Lovenox Code Status: Full Family Communication: Family not in room. Disposition  Plan: After IVIG treatment completed.  Consultants:   Neurology  Procedures:   None  Antimicrobials:  None  Objective: Vitals:   04/02/17 0600 04/02/17 0837 04/02/17 1100 04/02/17 1147  BP:  107/68  110/60  Pulse: (!) 58 (!) 51  (!) 49  Resp:    18  Temp:    99.1 F (37.3 C)  TempSrc:    Oral  SpO2:    99%  Weight:   96.6 kg (213 lb)   Height:   5\' 11"  (1.803 m)     Intake/Output Summary (Last 24 hours) at 04/02/17 1159 Last data filed at 04/01/17 1828  Gross per 24 hour  Intake           703.49 ml  Output              200 ml  Net           503.49 ml   Filed Weights   04/01/17 0202 04/02/17 1100  Weight: 96.9 kg (213 lb 9.6 oz) 96.6 kg (213 lb)    Examination: General exam: Appears calm and comfortable  HEENT: AC/AT, PERRLA, OP moist and clear Respiratory system: Clear to auscultation. No wheezes,crackle or rhonchi Cardiovascular system: S1 & S2 heard, RRR. No JVD, murmurs, rubs or gallops Gastrointestinal system: Abdomen is nondistended, soft and nontender. No organomegaly or masses felt. Normal bowel sounds heard. Central nervous system: Alert and oriented. No focal neurological deficits. Extremities: No pedal edema. Symmetric, strength 5/5   Skin: No rashes, lesions or ulcers Psychiatry: Judgement and insight appear normal. Mood & affect appropriate.    Data Reviewed: I have personally reviewed following labs and imaging studies  CBC:  Recent Labs Lab 04/01/17 0441  WBC  7.8  NEUTROABS 6.2  HGB 13.0  HCT 39.7  MCV 86.5  PLT 983   Basic Metabolic Panel:  Recent Labs Lab 04/01/17 0441  NA 140  K 3.1*  CL 103  CO2 27  GLUCOSE 144*  BUN 14  CREATININE 1.14  CALCIUM 9.2   GFR: Estimated Creatinine Clearance: 70.4 mL/min (by C-G formula based on SCr of 1.14 mg/dL). Liver Function Tests:  Recent Labs Lab 04/01/17 0441  AST 26  ALT 24  ALKPHOS 45  BILITOT 0.6  PROT 7.0  ALBUMIN 3.7   No results for input(s): LIPASE, AMYLASE in  the last 168 hours. No results for input(s): AMMONIA in the last 168 hours. Coagulation Profile: No results for input(s): INR, PROTIME in the last 168 hours. Cardiac Enzymes: No results for input(s): CKTOTAL, CKMB, CKMBINDEX, TROPONINI in the last 168 hours. BNP (last 3 results) No results for input(s): PROBNP in the last 8760 hours. HbA1C:  Recent Labs  04/01/17 0441  HGBA1C 7.4*   CBG:  Recent Labs Lab 04/01/17 0805 04/01/17 1224 04/01/17 1633 04/01/17 2142 04/02/17 0800  GLUCAP 160* 202* 188* 181* 124*   Lipid Profile:  Recent Labs  04/01/17 0441  CHOL 151  HDL 48  LDLCALC 87  TRIG 82  CHOLHDL 3.1   Thyroid Function Tests: No results for input(s): TSH, T4TOTAL, FREET4, T3FREE, THYROIDAB in the last 72 hours. Anemia Panel: No results for input(s): VITAMINB12, FOLATE, FERRITIN, TIBC, IRON, RETICCTPCT in the last 72 hours. Sepsis Labs: No results for input(s): PROCALCITON, LATICACIDVEN in the last 168 hours.  Recent Results (from the past 240 hour(s))  Culture, blood (Routine X 2) w Reflex to ID Panel     Status: None (Preliminary result)   Collection Time: 04/01/17  4:35 AM  Result Value Ref Range Status   Specimen Description BLOOD LEFT ANTECUBITAL  Final   Special Requests   Final    BOTTLES DRAWN AEROBIC AND ANAEROBIC Blood Culture adequate volume   Culture NO GROWTH 1 DAY  Final   Report Status PENDING  Incomplete  Culture, blood (Routine X 2) w Reflex to ID Panel     Status: None (Preliminary result)   Collection Time: 04/01/17  4:45 AM  Result Value Ref Range Status   Specimen Description BLOOD RIGHT ANTECUBITAL  Final   Special Requests   Final    BOTTLES DRAWN AEROBIC AND ANAEROBIC Blood Culture adequate volume   Culture NO GROWTH 1 DAY  Final   Report Status PENDING  Incomplete  Respiratory Panel by PCR     Status: Abnormal   Collection Time: 04/01/17  4:55 AM  Result Value Ref Range Status   Adenovirus NOT DETECTED NOT DETECTED Final    Coronavirus 229E NOT DETECTED NOT DETECTED Final   Coronavirus HKU1 NOT DETECTED NOT DETECTED Final   Coronavirus NL63 NOT DETECTED NOT DETECTED Final   Coronavirus OC43 NOT DETECTED NOT DETECTED Final   Metapneumovirus NOT DETECTED NOT DETECTED Final   Rhinovirus / Enterovirus DETECTED (A) NOT DETECTED Final   Influenza A NOT DETECTED NOT DETECTED Final   Influenza B NOT DETECTED NOT DETECTED Final   Parainfluenza Virus 1 NOT DETECTED NOT DETECTED Final   Parainfluenza Virus 2 NOT DETECTED NOT DETECTED Final   Parainfluenza Virus 3 NOT DETECTED NOT DETECTED Final   Parainfluenza Virus 4 NOT DETECTED NOT DETECTED Final   Respiratory Syncytial Virus NOT DETECTED NOT DETECTED Final   Bordetella pertussis NOT DETECTED NOT DETECTED Final   Chlamydophila pneumoniae  NOT DETECTED NOT DETECTED Final   Mycoplasma pneumoniae NOT DETECTED NOT DETECTED Final  Rapid strep screen (not at [facility])     Status: None   Collection Time: 04/01/17  4:55 AM  Result Value Ref Range Status   Streptococcus, Group A Screen (Direct) NEGATIVE NEGATIVE Final    Comment: (NOTE) A Rapid Antigen test may result negative if the antigen level in the sample is below the detection level of this test. The FDA has not cleared this test as a stand-alone test therefore the rapid antigen negative result has reflexed to a Group A Strep culture.   Culture, group A strep     Status: None (Preliminary result)   Collection Time: 04/01/17  4:55 AM  Result Value Ref Range Status   Specimen Description THROAT  Final   Special Requests NONE Reflexed from [id number]  Final   Culture CULTURE REINCUBATED FOR BETTER GROWTH  Final   Report Status PENDING  Incomplete  Urine Culture     Status: Abnormal   Collection Time: 04/01/17  6:38 AM  Result Value Ref Range Status   Specimen Description URINE, CLEAN CATCH  Final   Special Requests NONE  Final   Culture MULTIPLE SPECIES PRESENT, SUGGEST RECOLLECTION (A)  Final   Report Status  04/02/2017 FINAL  Final     Radiology Studies: Dg Chest 2 View  Result Date: 04/01/2017 CLINICAL DATA:  Cough for 2 days EXAM: CHEST  2 VIEW COMPARISON:  None. FINDINGS: Heart and mediastinal contours are within normal limits. No focal opacities or effusions. No acute bony abnormality. IMPRESSION: No active cardiopulmonary disease. Electronically Signed   By: Rolm Baptise M.D.   On: 04/01/2017 07:20   Scheduled Meds: . amLODipine  10 mg Oral Daily  . aspirin  81 mg Oral Daily  . atenolol  50 mg Oral Daily  . atorvastatin  20 mg Oral q1800  . cholecalciferol  1,000 Units Oral QHS  . dextromethorphan-guaiFENesin  1 tablet Oral BID  . ibuprofen  200 mg Oral QHS   And  . diphenhydrAMINE  25 mg Oral QHS  . enoxaparin (LOVENOX) injection  40 mg Subcutaneous Q24H  . insulin aspart  0-9 Units Subcutaneous TID WC  . insulin glargine  20 Units Subcutaneous Daily  . losartan  25 mg Oral Daily  . omega-3 acid ethyl esters  1 g Oral Daily  . pyridostigmine  60 mg Oral TID   Continuous Infusions: . Immune Globulin 10% 40 g (04/02/17 1157)     LOS: 1 day    Time spent: Total of 25 minutes spent with pt, greater than 50% of which was spent in discussion of  treatment, counseling and coordination of care   Minda Ditto, PA-S  Pager: Text Page via [email or web address]   If 7PM-7AM, please contact night-coverage [email or web address] 04/02/2017, 11:59 AM

## 2017-04-03 LAB — BASIC METABOLIC PANEL
Anion gap: 8 (ref 5–15)
BUN: 20 mg/dL (ref 6–20)
CO2: 24 mmol/L (ref 22–32)
CREATININE: 1.04 mg/dL (ref 0.61–1.24)
Calcium: 8.6 mg/dL — ABNORMAL LOW (ref 8.9–10.3)
Chloride: 106 mmol/L (ref 101–111)
Glucose, Bld: 127 mg/dL — ABNORMAL HIGH (ref 65–99)
POTASSIUM: 3.5 mmol/L (ref 3.5–5.1)
SODIUM: 138 mmol/L (ref 135–145)

## 2017-04-03 LAB — GLUCOSE, CAPILLARY
GLUCOSE-CAPILLARY: 229 mg/dL — AB (ref 65–99)
GLUCOSE-CAPILLARY: 166 mg/dL — AB (ref 65–99)
Glucose-Capillary: 149 mg/dL — ABNORMAL HIGH (ref 65–99)
Glucose-Capillary: 168 mg/dL — ABNORMAL HIGH (ref 65–99)

## 2017-04-03 LAB — CULTURE, GROUP A STREP (THRC)

## 2017-04-03 MED ORDER — WITCH HAZEL-GLYCERIN EX PADS
MEDICATED_PAD | CUTANEOUS | Status: DC | PRN
  Administered 2017-04-03: 1 via TOPICAL
  Filled 2017-04-03: qty 100

## 2017-04-03 NOTE — Progress Notes (Signed)
PROGRESS NOTE [facility]   Bob Warren   KCL:[id number] DOB: 1946/05/30  DOA: 04/01/2017 PCP: [name], MD   Brief Narrative:  Bob Warren is a 71 year old male with a history of hypertension, hyperlipidemia, diabetes, chronic back pain, and myasthenia gravis who presented to the ER on 03/31/17 with cold-like symptoms and severe weakness. He was found to have Enterovirus/Rhinovirus and a myasthenia gravis exacerbation. He has been receiving IVIG and has been improving.   Subjective: Bob Warren presented to the ER with cold symptoms that had been going on for 2-3 days, and he woke from sleep Monday morning and couldn't get out of bed secondary to muscle weakness. During his stay in the hospital he has been receiving Mucinex for the cold with improvement in his symptoms. He was started on IVIG Tuesday and had some reaction (shivering, nausea, and confusion) but has been tolerating continued treatment well. He has continued improvement in his weakness. Denies difficulty swallowing or chewing, shortness of breath, chest pain, dizziness, or nausea.  Assessment & Plan: Myasthenia gravis ([place]) - Exacerbation likely secondary to URI - Continue pyridostigmine 60mg  TID and IVIG infusion for a total of 5 days URI (upper respiratory infection) - Respiratory panel positive for Rhinovirus/Enterovirus. Strep negative.  - Throat and blood cultures showed no growth at 2 days.  - Continue Mucinex PRN. Chronic back pain - Could be contributing to some lower extremity weakness. Continue current medications. DM (diabetes mellitus) ([place]) - Stable. HgbA1C 7.4 - Continue insulin as directed. Hypertension - Well controlled. Continue amlodipine, atenolol.  HLD (hyperlipidemia) - Continue statin. Heart healthy diet recommended.   DVT prophylaxis: Lovenox Code Status: Full Family Communication: Family not in room. Disposition Plan: After IVIG treatment completed.  Consultants:    Neurology  Procedures:   None  Antimicrobials:  None  Objective: Vitals:   04/02/17 1742 04/02/17 2158 04/03/17 0552 04/03/17 0922  BP: (!) 142/81 138/70 128/73 122/61  Pulse: (!) 51 (!) 50 (!) 52 61  Resp: 16 16 16    Temp: 98.4 F (36.9 C) 99.3 F (37.4 C) 98.7 F (37.1 C)   TempSrc: Oral Oral Oral   SpO2: 99% 96% 97%   Weight:      Height:        Intake/Output Summary (Last 24 hours) at 04/03/17 1041 Last data filed at 04/03/17 0552  Gross per 24 hour  Intake                0 ml  Output              600 ml  Net             -600 ml   Filed Weights   04/01/17 0202 04/02/17 1100  Weight: 96.9 kg (213 lb 9.6 oz) 96.6 kg (213 lb)   Examination: General exam: Appears calm and comfortable  HEENT: AC/AT, PERRLA, OP moist and clear Respiratory system: Clear to auscultation. No wheezes,crackle or rhonchi Cardiovascular system: S1 & S2 heard, RRR. No JVD, murmurs, rubs or gallops Gastrointestinal system: Abdomen is nondistended, soft and nontender. No organomegaly or masses felt. Normal bowel sounds heard. Central nervous system: Alert and oriented. No focal neurological deficits. Extremities: No pedal edema. Symmetric, strength 5/5   Skin: No rashes, lesions or ulcers Psychiatry: Judgement and insight appear normal. Mood & affect appropriate.   Data Reviewed: I have personally reviewed following labs and imaging studies  CBC:  Recent Labs Lab 04/01/17 0441  WBC 7.8  NEUTROABS 6.2  HGB 13.0  HCT 39.7  MCV 86.5  PLT 400   Basic Metabolic Panel:  Recent Labs Lab 04/01/17 0441 04/03/17 0529  NA 140 138  K 3.1* 3.5  CL 103 106  CO2 27 24  GLUCOSE 144* 127*  BUN 14 20  CREATININE 1.14 1.04  CALCIUM 9.2 8.6*   GFR: Estimated Creatinine Clearance: 77.2 mL/min (by C-G formula based on SCr of 1.04 mg/dL). Liver Function Tests:  Recent Labs Lab 04/01/17 0441  AST 26  ALT 24  ALKPHOS 45  BILITOT 0.6  PROT 7.0  ALBUMIN 3.7   No results for  input(s): LIPASE, AMYLASE in the last 168 hours. No results for input(s): AMMONIA in the last 168 hours. Coagulation Profile: No results for input(s): INR, PROTIME in the last 168 hours. Cardiac Enzymes: No results for input(s): CKTOTAL, CKMB, CKMBINDEX, TROPONINI in the last 168 hours. BNP (last 3 results) No results for input(s): PROBNP in the last 8760 hours. HbA1C:  Recent Labs  04/01/17 0441  HGBA1C 7.4*   CBG:  Recent Labs Lab 04/02/17 0800 04/02/17 1203 04/02/17 1725 04/02/17 2211 04/03/17 0745  GLUCAP 124* 204* 117* 176* 149*   Lipid Profile:  Recent Labs  04/01/17 0441  CHOL 151  HDL 48  LDLCALC 87  TRIG 82  CHOLHDL 3.1   Thyroid Function Tests: No results for input(s): TSH, T4TOTAL, FREET4, T3FREE, THYROIDAB in the last 72 hours. Anemia Panel: No results for input(s): VITAMINB12, FOLATE, FERRITIN, TIBC, IRON, RETICCTPCT in the last 72 hours. Sepsis Labs: No results for input(s): PROCALCITON, LATICACIDVEN in the last 168 hours.  Recent Results (from the past 240 hour(s))  Culture, blood (Routine X 2) w Reflex to ID Panel     Status: None (Preliminary result)   Collection Time: 04/01/17  4:35 AM  Result Value Ref Range Status   Specimen Description BLOOD LEFT ANTECUBITAL  Final   Special Requests   Final    BOTTLES DRAWN AEROBIC AND ANAEROBIC Blood Culture adequate volume   Culture NO GROWTH 2 DAYS  Final   Report Status PENDING  Incomplete  Culture, blood (Routine X 2) w Reflex to ID Panel     Status: None (Preliminary result)   Collection Time: 04/01/17  4:45 AM  Result Value Ref Range Status   Specimen Description BLOOD RIGHT ANTECUBITAL  Final   Special Requests   Final    BOTTLES DRAWN AEROBIC AND ANAEROBIC Blood Culture adequate volume   Culture NO GROWTH 2 DAYS  Final   Report Status PENDING  Incomplete  Respiratory Panel by PCR     Status: Abnormal   Collection Time: 04/01/17  4:55 AM  Result Value Ref Range Status   Adenovirus NOT  DETECTED NOT DETECTED Final   Coronavirus 229E NOT DETECTED NOT DETECTED Final   Coronavirus HKU1 NOT DETECTED NOT DETECTED Final   Coronavirus NL63 NOT DETECTED NOT DETECTED Final   Coronavirus OC43 NOT DETECTED NOT DETECTED Final   Metapneumovirus NOT DETECTED NOT DETECTED Final   Rhinovirus / Enterovirus DETECTED (A) NOT DETECTED Final   Influenza A NOT DETECTED NOT DETECTED Final   Influenza B NOT DETECTED NOT DETECTED Final   Parainfluenza Virus 1 NOT DETECTED NOT DETECTED Final   Parainfluenza Virus 2 NOT DETECTED NOT DETECTED Final   Parainfluenza Virus 3 NOT DETECTED NOT DETECTED Final   Parainfluenza Virus 4 NOT DETECTED NOT DETECTED Final   Respiratory Syncytial Virus NOT DETECTED NOT DETECTED Final   Bordetella pertussis NOT DETECTED NOT DETECTED  Final   Chlamydophila pneumoniae NOT DETECTED NOT DETECTED Final   Mycoplasma pneumoniae NOT DETECTED NOT DETECTED Final  Rapid strep screen (not at [facility])     Status: None   Collection Time: 04/01/17  4:55 AM  Result Value Ref Range Status   Streptococcus, Group A Screen (Direct) NEGATIVE NEGATIVE Final    Comment: (NOTE) A Rapid Antigen test may result negative if the antigen level in the sample is below the detection level of this test. The FDA has not cleared this test as a stand-alone test therefore the rapid antigen negative result has reflexed to a Group A Strep culture.   Culture, group A strep     Status: None   Collection Time: 04/01/17  4:55 AM  Result Value Ref Range Status   Specimen Description THROAT  Final   Special Requests NONE Reflexed from [id number]  Final   Culture NO GROUP A STREP (S.PYOGENES) ISOLATED  Final   Report Status 04/03/2017 FINAL  Final  Urine Culture     Status: Abnormal   Collection Time: 04/01/17  6:38 AM  Result Value Ref Range Status   Specimen Description URINE, CLEAN CATCH  Final   Special Requests NONE  Final   Culture MULTIPLE SPECIES PRESENT, SUGGEST RECOLLECTION (A)  Final   Report  Status 04/02/2017 FINAL  Final    Radiology Studies: No results found.  Scheduled Meds: . amLODipine  10 mg Oral Daily  . aspirin  81 mg Oral Daily  . atenolol  50 mg Oral Daily  . atorvastatin  20 mg Oral q1800  . cholecalciferol  1,000 Units Oral QHS  . dextromethorphan-guaiFENesin  1 tablet Oral BID  . ibuprofen  200 mg Oral QHS   And  . diphenhydrAMINE  25 mg Oral QHS  . enoxaparin (LOVENOX) injection  40 mg Subcutaneous Q24H  . insulin aspart  0-9 Units Subcutaneous TID WC  . insulin glargine  20 Units Subcutaneous Daily  . losartan  25 mg Oral Daily  . omega-3 acid ethyl esters  1 g Oral Daily  . pyridostigmine  60 mg Oral TID   Continuous Infusions: . Immune Globulin 10% 40 g (04/02/17 1157)     LOS: 2 days    Time spent: Total of 25 minutes spent with pt, greater than 50% of which was spent in discussion of  treatment, counseling and coordination of care  Minda Ditto, PA-S Pager: Text Page via [email or web address]   If 7PM-7AM, please contact night-coverage [email or web address] 04/03/2017, 10:41 AM

## 2017-04-03 NOTE — Progress Notes (Signed)
NIF greater than -40, pt states he is breathing "fine".   VC unable to obtain d/t spirometer currently not available, will attempt to check VC later.

## 2017-04-03 NOTE — Progress Notes (Signed)
RT unable to see pt for NIF/VC due to unstable patient in PICU. RT will continue to monitor.

## 2017-04-04 LAB — GLUCOSE, CAPILLARY
GLUCOSE-CAPILLARY: 156 mg/dL — AB (ref 65–99)
Glucose-Capillary: 237 mg/dL — ABNORMAL HIGH (ref 65–99)
Glucose-Capillary: 135 mg/dL — ABNORMAL HIGH (ref 65–99)
Glucose-Capillary: 228 mg/dL — ABNORMAL HIGH (ref 65–99)

## 2017-04-04 NOTE — Progress Notes (Signed)
RT unavailable to measure NIF and VC.  RT will resume in the am

## 2017-04-04 NOTE — Progress Notes (Signed)
PROGRESS NOTE [facility]   Bob Warren   NIO:[id number] DOB: October 03, 1945  DOA: 04/01/2017 PCP: [name], MD   Brief Narrative:  Bob Warren  is a 71 year old male with history of hypertension, diabetes, chronic low back pain, and recently diagnosed myasthenia gravis who presented to the emergency department with bilateral leg weakness and URI symptoms and was admitted for myasthenia gravis exacerbation. Neurology was consulted and recommended IVIG for 5 days. Today is day 4/5 of IVIG treatment.   Subjective: Patient doing well today, weakness improving. He states he is almost feeling back to baseline. He had a hard time sleeping last night and is just a little more tired today. Tolerating IVIG without side effects.   Assessment & Plan: Myasthenia gravis ([place]) - Exacerbation likely secondary to URI - Continue pyridostigmine 60mg  TID and IVIG infusion for a total of 5 days. Today is day 4/5 URI (upper respiratory infection) - Respiratory panel positive for Rhinovirus/Enterovirus. Strep negative.  - Throat and blood cultures showed no growth at 2 days.  - Continue Mucinex PRN. Chronic back pain - Stable. Continue current medications. DM (diabetes mellitus) ([place]) - Stable. HgbA1C 7.4 - Continue insulin as directed. Hypertension - Well controlled. Continue amlodipine, atenolol.  HLD (hyperlipidemia) - Continue statin. Heart healthy diet recommended.  DVT prophylaxis: Lovenox Code Status: Full Family Communication: Family not in room Disposition Plan: After IVIG treatment completed  Consultants:   Neurology  Procedures:   None  Antimicrobials:  None  Objective: Vitals:   04/03/17 1412 04/03/17 1849 04/04/17 0529 04/04/17 0844  BP: 136/66 135/62 (!) 141/66 138/63  Pulse: (!) 50 83 (!) 49 83  Resp: 20 16 18    Temp: 98.6 F (37 C) 98.3 F (36.8 C) 98.3 F (36.8 C)   TempSrc: Oral Oral Oral   SpO2: 96% 96% 94%   Weight:      Height:         Intake/Output Summary (Last 24 hours) at 04/04/17 1053 Last data filed at 04/03/17 1723  Gross per 24 hour  Intake                0 ml  Output              100 ml  Net             -100 ml   Filed Weights   04/01/17 0202 04/02/17 1100  Weight: 96.9 kg (213 lb 9.6 oz) 96.6 kg (213 lb)    Examination: General exam: Appears calm and comfortable  HEENT: AC/AT, PERRLA, OP moist and clear. No obvious lid lag or eye droop.  Respiratory system: Clear to auscultation. No wheezes,crackle or rhonchi Cardiovascular system: S1 & S2 heard, RRR. No JVD, murmurs, rubs or gallops Gastrointestinal system: Abdomen is nondistended, soft and nontender. No organomegaly or masses felt. Normal bowel sounds heard. Central nervous system: Alert and oriented. No focal neurological deficits. Extremities: No pedal edema. Symmetric, strength 5/5   Skin: No rashes, lesions or ulcers Psychiatry: Judgement and insight appear normal. Mood & affect appropriate.   Data Reviewed: I have personally reviewed following labs and imaging studies  CBC:  Recent Labs Lab 04/01/17 0441  WBC 7.8  NEUTROABS 6.2  HGB 13.0  HCT 39.7  MCV 86.5  PLT 818   Basic Metabolic Panel:  Recent Labs Lab 04/01/17 0441 04/03/17 0529  NA 140 138  K 3.1* 3.5  CL 103 106  CO2 27 24  GLUCOSE 144* 127*  BUN 14 20  CREATININE 1.14 1.04  CALCIUM 9.2 8.6*   GFR: Estimated Creatinine Clearance: 77.2 mL/min (by C-G formula based on SCr of 1.04 mg/dL). Liver Function Tests:  Recent Labs Lab 04/01/17 0441  AST 26  ALT 24  ALKPHOS 45  BILITOT 0.6  PROT 7.0  ALBUMIN 3.7   No results for input(s): LIPASE, AMYLASE in the last 168 hours. No results for input(s): AMMONIA in the last 168 hours. Coagulation Profile: No results for input(s): INR, PROTIME in the last 168 hours. Cardiac Enzymes: No results for input(s): CKTOTAL, CKMB, CKMBINDEX, TROPONINI in the last 168 hours. BNP (last 3 results) No results for input(s):  PROBNP in the last 8760 hours. HbA1C: No results for input(s): HGBA1C in the last 72 hours. CBG:  Recent Labs Lab 04/03/17 0745 04/03/17 1208 04/03/17 1715 04/03/17 2351 04/04/17 0800  GLUCAP 149* 229* 166* 168* 156*   Lipid Profile: No results for input(s): CHOL, HDL, LDLCALC, TRIG, CHOLHDL, LDLDIRECT in the last 72 hours. Thyroid Function Tests: No results for input(s): TSH, T4TOTAL, FREET4, T3FREE, THYROIDAB in the last 72 hours. Anemia Panel: No results for input(s): VITAMINB12, FOLATE, FERRITIN, TIBC, IRON, RETICCTPCT in the last 72 hours. Sepsis Labs: No results for input(s): PROCALCITON, LATICACIDVEN in the last 168 hours.  Recent Results (from the past 240 hour(s))  Culture, blood (Routine X 2) w Reflex to ID Panel     Status: None (Preliminary result)   Collection Time: 04/01/17  4:35 AM  Result Value Ref Range Status   Specimen Description BLOOD LEFT ANTECUBITAL  Final   Special Requests   Final    BOTTLES DRAWN AEROBIC AND ANAEROBIC Blood Culture adequate volume   Culture NO GROWTH 2 DAYS  Final   Report Status PENDING  Incomplete  Culture, blood (Routine X 2) w Reflex to ID Panel     Status: None (Preliminary result)   Collection Time: 04/01/17  4:45 AM  Result Value Ref Range Status   Specimen Description BLOOD RIGHT ANTECUBITAL  Final   Special Requests   Final    BOTTLES DRAWN AEROBIC AND ANAEROBIC Blood Culture adequate volume   Culture NO GROWTH 2 DAYS  Final   Report Status PENDING  Incomplete  Respiratory Panel by PCR     Status: Abnormal   Collection Time: 04/01/17  4:55 AM  Result Value Ref Range Status   Adenovirus NOT DETECTED NOT DETECTED Final   Coronavirus 229E NOT DETECTED NOT DETECTED Final   Coronavirus HKU1 NOT DETECTED NOT DETECTED Final   Coronavirus NL63 NOT DETECTED NOT DETECTED Final   Coronavirus OC43 NOT DETECTED NOT DETECTED Final   Metapneumovirus NOT DETECTED NOT DETECTED Final   Rhinovirus / Enterovirus DETECTED (A) NOT  DETECTED Final   Influenza A NOT DETECTED NOT DETECTED Final   Influenza B NOT DETECTED NOT DETECTED Final   Parainfluenza Virus 1 NOT DETECTED NOT DETECTED Final   Parainfluenza Virus 2 NOT DETECTED NOT DETECTED Final   Parainfluenza Virus 3 NOT DETECTED NOT DETECTED Final   Parainfluenza Virus 4 NOT DETECTED NOT DETECTED Final   Respiratory Syncytial Virus NOT DETECTED NOT DETECTED Final   Bordetella pertussis NOT DETECTED NOT DETECTED Final   Chlamydophila pneumoniae NOT DETECTED NOT DETECTED Final   Mycoplasma pneumoniae NOT DETECTED NOT DETECTED Final  Rapid strep screen (not at [facility])     Status: None   Collection Time: 04/01/17  4:55 AM  Result Value Ref Range Status   Streptococcus, Group A Screen (Direct) NEGATIVE NEGATIVE Final  Comment: (NOTE) A Rapid Antigen test may result negative if the antigen level in the sample is below the detection level of this test. The FDA has not cleared this test as a stand-alone test therefore the rapid antigen negative result has reflexed to a Group A Strep culture.   Culture, group A strep     Status: None   Collection Time: 04/01/17  4:55 AM  Result Value Ref Range Status   Specimen Description THROAT  Final   Special Requests NONE Reflexed from [id number]  Final   Culture NO GROUP A STREP (S.PYOGENES) ISOLATED  Final   Report Status 04/03/2017 FINAL  Final  Urine Culture     Status: Abnormal   Collection Time: 04/01/17  6:38 AM  Result Value Ref Range Status   Specimen Description URINE, CLEAN CATCH  Final   Special Requests NONE  Final   Culture MULTIPLE SPECIES PRESENT, SUGGEST RECOLLECTION (A)  Final   Report Status 04/02/2017 FINAL  Final    Radiology Studies: No results found.  Scheduled Meds: . amLODipine  10 mg Oral Daily  . aspirin  81 mg Oral Daily  . atenolol  50 mg Oral Daily  . atorvastatin  20 mg Oral q1800  . cholecalciferol  1,000 Units Oral QHS  . dextromethorphan-guaiFENesin  1 tablet Oral BID  . ibuprofen  200  mg Oral QHS   And  . diphenhydrAMINE  25 mg Oral QHS  . enoxaparin (LOVENOX) injection  40 mg Subcutaneous Q24H  . insulin aspart  0-9 Units Subcutaneous TID WC  . insulin glargine  20 Units Subcutaneous Daily  . losartan  25 mg Oral Daily  . omega-3 acid ethyl esters  1 g Oral Daily  . pyridostigmine  60 mg Oral TID   Continuous Infusions: . Immune Globulin 10% 40 g (04/03/17 1300)     LOS: 3 days   Time spent: Total of 25 minutes spent with pt, greater than 50% of which was spent in discussion of  treatment, counseling and coordination of care  Minda Ditto, PA-S Pager: Text Page via [email or web address]   If 7PM-7AM, please contact night-coverage [email or web address] 04/04/2017, 10:53 AM

## 2017-04-04 NOTE — Progress Notes (Signed)
NIF -60/ VC 1.1L. Pt had good effort.

## 2017-04-05 DIAGNOSIS — M549 Dorsalgia, unspecified: Secondary | ICD-10-CM

## 2017-04-05 DIAGNOSIS — G8929 Other chronic pain: Secondary | ICD-10-CM

## 2017-04-05 LAB — GLUCOSE, CAPILLARY
GLUCOSE-CAPILLARY: 193 mg/dL — AB (ref 65–99)
Glucose-Capillary: 155 mg/dL — ABNORMAL HIGH (ref 65–99)

## 2017-04-05 NOTE — Progress Notes (Signed)
Nsg Discharge Note  Admit Date:  04/01/2017 Discharge date: 04/05/2017   Bob Warren to be D/C'd Home per MD order.  AVS completed.  Copy for chart, and copy for patient signed, and dated. Patient/caregiver able to verbalize understanding.  Discharge Medication: Allergies as of 04/05/2017      Reactions   Lisinopril    Other reaction(s): Other (See Comments) Cough      Medication List    STOP taking these medications   ADVIL PM PO   hydrochlorothiazide 25 MG tablet Commonly known as:  HYDRODIURIL   mycophenolate 500 MG tablet Commonly known as:  CELLCEPT     TAKE these medications   amLODipine 10 MG tablet Commonly known as:  NORVASC Take 10 mg by mouth.   aspirin 81 MG chewable tablet Chew 81 mg by mouth.   atenolol 50 MG tablet Commonly known as:  TENORMIN Take 50 mg by mouth.   atorvastatin 20 MG tablet Commonly known as:  LIPITOR Take 20 mg by mouth.   Fish Oil 1000 MG Caps Take 2 capsules by mouth daily.   LANTUS Long Inject 28 Units into the skin daily.   losartan 25 MG tablet Commonly known as:  COZAAR Take 25 mg by mouth.   metFORMIN 850 MG tablet Commonly known as:  GLUCOPHAGE Take 850 mg by mouth.   pyridostigmine 60 MG tablet Commonly known as:  MESTINON Take 1 tablet (60 mg total) by mouth 3 (three) times daily.   Vitamin D3 1000 units Caps Take by mouth.            Discharge Care Instructions        Start     Ordered   04/05/17 0000  Increase activity slowly     04/05/17 1512   04/05/17 0000  Diet - low sodium heart healthy     04/05/17 1512   04/05/17 0000  Call MD for:  temperature >100.4     04/05/17 1512   04/05/17 0000  Call MD for:  persistant nausea and vomiting     04/05/17 1512   04/05/17 0000  Call MD for:  severe uncontrolled pain     04/05/17 1512   04/05/17 0000  Call MD for:  redness, tenderness, or signs of infection (pain, swelling, redness, odor or green/yellow discharge around incision site)     04/05/17  1512   04/05/17 0000  Call MD for:  difficulty breathing, headache or visual disturbances     04/05/17 1512   04/05/17 0000  Call MD for:  hives     04/05/17 1512   04/05/17 0000  Call MD for:  persistant dizziness or light-headedness     04/05/17 1512   04/05/17 0000  Call MD for:  extreme fatigue     04/05/17 1512      Discharge Assessment: Vitals:   04/05/17 1217 04/05/17 1310  BP: 139/65 119/66  Pulse: (!) 57 (!) 50  Resp:  16  Temp: 98.3 F (36.8 C) 98.8 F (37.1 C)  SpO2: 99% 98%   Skin clean, dry and intact without evidence of skin break down, no evidence of skin tears noted. IV catheter discontinued intact. Site without signs and symptoms of complications - no redness or edema noted at insertion site, patient denies c/o pain - only slight tenderness at site.  Dressing with slight pressure applied.  D/c Instructions-Education: Discharge instructions given to patient/family with verbalized understanding. D/c education completed with patient/family including follow up instructions, medication list, d/c  activities limitations if indicated, with other d/c instructions as indicated by MD - patient able to verbalize understanding, all questions fully answered. Patient instructed to return to ED, call 911, or call MD for any changes in condition.  Patient escorted via [place], and D/C home via private auto.  Salley Slaughter, RN 04/05/2017 4:11 PM

## 2017-04-05 NOTE — Progress Notes (Signed)
NIF -60 great effort.  Could not find VC meter

## 2017-04-05 NOTE — Discharge Summary (Signed)
Physician Discharge Summary  Bob Warren  JYN:[id number]  DOB: 28-Oct-1945  DOA: 04/01/2017 PCP: [name], MD  Admit date: 04/01/2017 Discharge date: 04/05/2017  Admitted From: Home  Disposition: Home  Recommendations for Outpatient Follow-up:  1. Follow up with PCP in 1 week  2. Please obtain BMP/CBC in one week to monitor Hgb and renal function  3. Please follow up with neurology   Discharge Condition: Home  CODE STATUS: FULL  Diet recommendation: Heart Healthy   Brief/Interim Summary: Patient is 71 year old male with medical history of hypertension, diabetes, myasthenia gravis recently diagnose and chronic lower back pain who presented to the emergency department with bilateral leg weakness and URI symptoms was admitted for myasthenia gravis exacerbation.neurology was consulted and recommended IVIG for 5 days. Patient subsequently improved symptoms resolved patient now back to baseline. After completion of IVIG patient was monitored for side effects which patient did not have any. Patient will discharge home to follow up with PCP and neurology in 2-3 weeks.  Subjective: Patient seen and examined on the day of discharge. He has no complaints. Remains afebrile. Back to baseline. Tolerating diet well and ambulating with no issues  Discharge Diagnoses/Hospital Course:  Myasthenia gravis exacerbation - secondary to rhinovirus Completed IVIG treatment for 5 days Patient was continue [place], with no changes Neurology follow-up in 2-3 weeks  Acute viral infection due to rhinovirus - improve Continue supportive treatment  Chronic back pain Stable. Continue current medications.  DM (diabetes mellitus) ([place]) CBG's stable during hospital stay. HgbA1C 7.4 No changes in medication were made   Hypertension Well controlled. Continue amlodipine, atenolol.   HLD (hyperlipidemia) Continue statin. Heart healthy diet recommended.  All other chronic medical condition were stable  during the hospitalization.  On the day of the discharge the patient's vitals were stable, and no other acute medical condition were reported by patient. Patient was felt safe to be discharge to home   Discharge Instructions  You were cared for by a hospitalist during your hospital stay. If you have any questions about your discharge medications or the care you received while you were in the hospital after you are discharged, you can call the unit and asked to speak with the hospitalist on call if the hospitalist that took care of you is not available. Once you are discharged, your primary care physician will handle any further medical issues. Please note that NO REFILLS for any discharge medications will be authorized once you are discharged, as it is imperative that you return to your primary care physician (or establish a relationship with a primary care physician if you do not have one) for your aftercare needs so that they can reassess your need for medications and monitor your lab values.  Discharge Instructions    Call MD for:  difficulty breathing, headache or visual disturbances    Complete by:  As directed    Call MD for:  extreme fatigue    Complete by:  As directed    Call MD for:  hives    Complete by:  As directed    Call MD for:  persistant dizziness or light-headedness    Complete by:  As directed    Call MD for:  persistant nausea and vomiting    Complete by:  As directed    Call MD for:  redness, tenderness, or signs of infection (pain, swelling, redness, odor or green/yellow discharge around incision site)    Complete by:  As directed    Call MD for:  severe uncontrolled pain    Complete by:  As directed    Call MD for:  temperature >100.4    Complete by:  As directed    Diet - low sodium heart healthy    Complete by:  As directed    Increase activity slowly    Complete by:  As directed      Allergies as of 04/05/2017      Reactions   Lisinopril    Other reaction(s):  Other (See Comments) Cough      Medication List    STOP taking these medications   ADVIL PM PO   hydrochlorothiazide 25 MG tablet Commonly known as:  HYDRODIURIL   mycophenolate 500 MG tablet Commonly known as:  CELLCEPT     TAKE these medications   amLODipine 10 MG tablet Commonly known as:  NORVASC Take 10 mg by mouth.   aspirin 81 MG chewable tablet Chew 81 mg by mouth.   atenolol 50 MG tablet Commonly known as:  TENORMIN Take 50 mg by mouth.   atorvastatin 20 MG tablet Commonly known as:  LIPITOR Take 20 mg by mouth.   Fish Oil 1000 MG Caps Take 2 capsules by mouth daily.   LANTUS [place] Inject 28 Units into the skin daily.   losartan 25 MG tablet Commonly known as:  COZAAR Take 25 mg by mouth.   metFORMIN 850 MG tablet Commonly known as:  GLUCOPHAGE Take 850 mg by mouth.   pyridostigmine 60 MG tablet Commonly known as:  MESTINON Take 1 tablet (60 mg total) by mouth 3 (three) times daily.   Vitamin D3 1000 units Caps Take by mouth.            Discharge Care Instructions        Start     Ordered   04/05/17 0000  Increase activity slowly     04/05/17 1512   04/05/17 0000  Diet - low sodium heart healthy     04/05/17 1512   04/05/17 0000  Call MD for:  temperature >100.4     04/05/17 1512   04/05/17 0000  Call MD for:  persistant nausea and vomiting     04/05/17 1512   04/05/17 0000  Call MD for:  severe uncontrolled pain     04/05/17 1512   04/05/17 0000  Call MD for:  redness, tenderness, or signs of infection (pain, swelling, redness, odor or green/yellow discharge around incision site)     04/05/17 1512   04/05/17 0000  Call MD for:  difficulty breathing, headache or visual disturbances     04/05/17 1512   04/05/17 0000  Call MD for:  hives     04/05/17 1512   04/05/17 0000  Call MD for:  persistant dizziness or light-headedness     04/05/17 1512   04/05/17 0000  Call MD for:  extreme fatigue     04/05/17 1512     Follow-up  Information    [name], MD. Schedule an appointment as soon as possible for a visit in 1 week(s).   Specialty:  Internal Medicine Why:  Hospital follow up  Contact information: [place] [address] [phone]        Melvenia Beam, MD. Schedule an appointment as soon as possible for a visit in 2 week(s).   Specialty:  Neurology Why:  Hospital follow up  Contact information: [place] [address] [phone]  Allergies  Allergen Reactions  . Lisinopril     Other reaction(s): Other (See Comments) Cough    Consultations:  Neurology    Procedures/Studies: Dg Facial Bones 1-2 Views  Result Date: 03/07/2017 CLINICAL DATA:  71 year old male with possible foreign body seen on dental x-rays. MRI screening. EXAM: FACIAL BONES - 1-2 VIEW COMPARISON:  None FINDINGS: Three radiographs of the skull demonstrate 2 metallic foreign bodies in the soft tissues overlying the left mandible. One of the metallic foreign bodies measures 5 mm in length while the second measures approximately 9 mm in length. These structures are likely within the submandibular space. Has the patient had prior left submandibular gland surgery? Multiple dental amalgams are noted. The patient is wearing a hearing aid on the left. No evidence of foreign bodies in the region of the orbits. IMPRESSION: Two metallic foreign bodies are present in the left submandibular space adjacent to the mandible. Electronically Signed   By: Jacqulynn Cadet M.D.   On: 03/07/2017 08:23   Dg Chest 2 View  Result Date: 04/01/2017 CLINICAL DATA:  Cough for 2 days EXAM: CHEST  2 VIEW COMPARISON:  None. FINDINGS: Heart and mediastinal contours are within normal limits. No focal opacities or effusions. No acute bony abnormality. IMPRESSION: No active cardiopulmonary disease. Electronically Signed   By: Rolm Baptise M.D.   On: 04/01/2017 07:20   Mr Jodene Nam Head Wo  Contrast  Result Date: 03/08/2017  [facility] [address] [place], [place] [address] [phone] NEUROIMAGING REPORT STUDY DATE: 03/07/2017 PATIENT NAME: Gianlucca Szymborski DOB: 18-Aug-1945 MRN: [id number] EXAM: MR angiogram of the intracranial arteries ORDERING CLINICIAN: Sarina Ill M.D. CLINICAL HISTORY: 71 year old man with left ptosis COMPARISON FILMS: TECHNIQUE: MR angiogram of the head was obtained utilizing 3D time of flight sequences from below the vertebrobasilar junction up to the intracranial vasculature without contrast.  Computerized reconstructions were obtained. CONTRAST: None IMAGING SITE: [facility], [place], [place], [place] FINDINGS: The imaged extracranial and intracranial portions of the internal carotid arteries appear normal.  There is stenosis within the M1 segment of the right middle cerebral artery and markedly reduced flow in the M2 segments on the right relative to the left. The A1 segment of the left anterior cerebral artery is aplastic and the right A2 segment derives is flow from the left through the anterior committing artery. The left middle cerebral and anterior cerebral arteries appear normal. In the posterior circulation, the vertebral arteries are co-dominant. No stenosis is noted within the vertebral arteries and the basilar arteries. The right posterior cerebral artery appears normal. There is mild stenosis in the distal P2 segment of the left PCA.Marland Kitchen No aneurysms were identified.     On the source images, there is encephalomalacia in the right hemisphere consistent with a moderate sized chronic stroke in the MCA distribution.    This MR angiogram shows the following: 1.    There is significant stenosis within the distal M1 segment of the right middle cerebral artery and markedly reduced flow within the M2 segments on the right relative to the normal left.    This is likely related to the moderate sized chronic stroke noted on the  source images 2.    The A1 segment of the right anterior cerebral artery is a plastic. This is a normal variant. 3.     Mild stenosis in the distal P2 segment of the left posterior cerebral artery INTERPRETING PHYSICIAN: Richard A. Felecia Shelling, MD, PhD, FAAN Certified in  Neuroimaging by [place] [facility]   Mr Jodene Nam Neck W Wo Contrast  Result Date: 03/08/2017  [facility] [address], [place], [place] [address] [phone] NEUROIMAGING REPORT STUDY DATE: 03/07/2017 PATIENT NAME: Jewel Venditto DOB: 1946/05/28 MRN: [id number] EXAM: MR angiogram of the neck arteries with and without contrast ORDERING CLINICIAN: Sarina Ill M.D. CLINICAL HISTORY: 71 year old man with ptosis COMPARISON FILMS: None TECHNIQUE: MR angiogram of the neck was obtained utilizing 3D time-of-flight sequences from the aortic arch up to the intracranial vasculature postbolus contrast infusion.  2D time-of-flight noncontrast views and computerized reconstructions were obtained. CONTRAST: None IMAGING SITE: [facility], [address] [place], [place], [place] FINDINGS: This study is of adequate technical quality.  There is anterograde flow in the bilateral vertebral and carotid arteries on 2D-TOF views.  The flow signal of the left subclavian artery has no stenosis.  The left common and external carotid arteries have no stenosis.  There is about 10-15% stenosis at the origin of the left internal carotid artery. This is not hemodynamically significant  The left vertebral artery has no stenosis from its origin up to the vertebrobasilar junction.  On the right brachiocephalic trunk and subclavian arteries have no stenosis. The right common, internal and external carotid arteries have no stenosis. The right vertebral artery has no stenosis from its origin to the vertebrobasilar junction. Limited views of the intracranial vasculature are unremarkable.    This MR angiogram of the neck arteries with and without  contrast shows the following: 1.    10-15% stenosis of the origin of the left internal carotid artery. This is not hemodynamically significant. 2.    There is no evidence of clinically significant stenosis or dissection. INTERPRETING PHYSICIAN: Richard A. Felecia Shelling, MD, PhD, FAAN Certified in  Neuroimaging by [place] [employer] [facility]   Mr Jeri Cos Wo Contrast  Result Date: 03/15/2017  [facility] [address], [place], [place] [address] [phone] NEUROIMAGING REPORT STUDY DATE: 03/14/2017 PATIENT NAME: Malachai Schalk DOB: 12-11-1945 MRN: [id number] EXAM: MRI Brain with and without contrast ORDERING CLINICIAN: Sarina Ill M.D. CLINICAL HISTORY: 71 year old man with ptosis and abnormal pupillary reflex COMPARISON FILMS: MR angiogram 03/07/2017 TECHNIQUE:MRI of the brain with and without contrast was obtained utilizing 5 mm axial slices with T1, T2, T2 flair, SWI and diffusion weighted views.  T1 sagittal, T2 coronal and postcontrast views in the axial and coronal plane were obtained. CONTRAST: 20 ml [employer] IMAGING SITE: [facility], [place]. FINDINGS: On sagittal images, the spinal cord is imaged caudally to C2-C3 and is normal in caliber.   The contents of the posterior fossa are of normal size and position.   The pituitary gland and optic chiasm appear normal.    The third and lateral ventricles are enlarged, in proportion to the extent of moderate cortical atrophy. Atrophy is most pronounced in the mesial temporal lobes and the peri-insular region. The peri-insular atrophy was felt to possibly represent encephalomalacia on the MRA source images.   There are no abnormal extra-axial collections of fluid.  The cerebellum and brainstem appears normal.   The deep gray matter appears normal.  There are some T2/FLAIR hyperintense foci in the subcortical and deep white matter bilaterally.   Diffusion weighted images are normal.  Susceptibility weighted images are  normal.  The orbits appear normal.   The VIIth/VIIIth nerve complex appears normal.  The mastoid air cells appear normal.  The paranasal sinuses appear normal.  Flow voids are  identified within the major intracerebral arteries.  After the infusion of contrast material, a normal enhancement pattern is noted.    This MRI of the brain with and without contrast shows the following: 1.   Moderate cortical atrophy that is most pronounced in the mesial temporal lobes and the peri-insular region. 2.   Mild chronic microvascular ischemia 3.   There are no acute findings and there is a normal enhancement pattern. INTERPRETING PHYSICIAN: Richard A. Felecia Shelling, MD, PhD, FAAN Certified in  Neuroimaging by [place] [employer] [facility]    Discharge Exam: Vitals:   04/05/17 1217 04/05/17 1310  BP: 139/65 119/66  Pulse: (!) 57 (!) 50  Resp:  16  Temp: 98.3 F (36.8 C) 98.8 F (37.1 C)  SpO2: 99% 98%   Vitals:   04/04/17 2107 04/05/17 0603 04/05/17 1217 04/05/17 1310  BP: (!) 132/58 139/63 139/65 119/66  Pulse: (!) 50 (!) 44 (!) 57 (!) 50  Resp: 19 16  16   Temp: 98.8 F (37.1 C) 98.1 F (36.7 C) 98.3 F (36.8 C) 98.8 F (37.1 C)  TempSrc: Oral Oral Oral Oral  SpO2: 99% 98% 99% 98%  Weight:      Height:        General: Pt is alert, awake, not in acute distress Cardiovascular: RRR, S1/S2 +, no rubs, no gallops Respiratory: CTA bilaterally, no wheezing, no rhonchi Abdominal: Soft, NT, ND, bowel sounds + Extremities: no edema, no cyanosis    The results of significant diagnostics from this hospitalization (including imaging, microbiology, ancillary and laboratory) are listed below for reference.     Microbiology: Recent Results (from the past 240 hour(s))  Culture, blood (Routine X 2) w Reflex to ID Panel     Status: None (Preliminary result)   Collection Time: 04/01/17  4:35 AM  Result Value Ref Range Status   Specimen Description BLOOD LEFT ANTECUBITAL  Final   Special Requests   Final     BOTTLES DRAWN AEROBIC AND ANAEROBIC Blood Culture adequate volume   Culture NO GROWTH 4 DAYS  Final   Report Status PENDING  Incomplete  Culture, blood (Routine X 2) w Reflex to ID Panel     Status: None (Preliminary result)   Collection Time: 04/01/17  4:45 AM  Result Value Ref Range Status   Specimen Description BLOOD RIGHT ANTECUBITAL  Final   Special Requests   Final    BOTTLES DRAWN AEROBIC AND ANAEROBIC Blood Culture adequate volume   Culture NO GROWTH 4 DAYS  Final   Report Status PENDING  Incomplete  Respiratory Panel by PCR     Status: Abnormal   Collection Time: 04/01/17  4:55 AM  Result Value Ref Range Status   Adenovirus NOT DETECTED NOT DETECTED Final   Coronavirus 229E NOT DETECTED NOT DETECTED Final   Coronavirus HKU1 NOT DETECTED NOT DETECTED Final   Coronavirus NL63 NOT DETECTED NOT DETECTED Final   Coronavirus OC43 NOT DETECTED NOT DETECTED Final   Metapneumovirus NOT DETECTED NOT DETECTED Final   Rhinovirus / Enterovirus DETECTED (A) NOT DETECTED Final   Influenza A NOT DETECTED NOT DETECTED Final   Influenza B NOT DETECTED NOT DETECTED Final   Parainfluenza Virus 1 NOT DETECTED NOT DETECTED Final   Parainfluenza Virus 2 NOT DETECTED NOT DETECTED Final   Parainfluenza Virus 3 NOT DETECTED NOT DETECTED Final   Parainfluenza Virus 4 NOT DETECTED NOT DETECTED Final   Respiratory Syncytial Virus NOT DETECTED NOT DETECTED Final   Bordetella pertussis NOT DETECTED NOT  DETECTED Final   Chlamydophila pneumoniae NOT DETECTED NOT DETECTED Final   Mycoplasma pneumoniae NOT DETECTED NOT DETECTED Final  Rapid strep screen (not at [facility])     Status: None   Collection Time: 04/01/17  4:55 AM  Result Value Ref Range Status   Streptococcus, Group A Screen (Direct) NEGATIVE NEGATIVE Final    Comment: (NOTE) A Rapid Antigen test may result negative if the antigen level in the sample is below the detection level of this test. The FDA has not cleared this test as a stand-alone  test therefore the rapid antigen negative result has reflexed to a Group A Strep culture.   Culture, group A strep     Status: None   Collection Time: 04/01/17  4:55 AM  Result Value Ref Range Status   Specimen Description THROAT  Final   Special Requests NONE Reflexed from [id number]  Final   Culture NO GROUP A STREP (S.PYOGENES) ISOLATED  Final   Report Status 04/03/2017 FINAL  Final  Urine Culture     Status: Abnormal   Collection Time: 04/01/17  6:38 AM  Result Value Ref Range Status   Specimen Description URINE, CLEAN CATCH  Final   Special Requests NONE  Final   Culture MULTIPLE SPECIES PRESENT, SUGGEST RECOLLECTION (A)  Final   Report Status 04/02/2017 FINAL  Final     Labs: BNP (last 3 results) No results for input(s): BNP in the last 8760 hours. Basic Metabolic Panel:  Recent Labs Lab 04/01/17 0441 04/03/17 0529  NA 140 138  K 3.1* 3.5  CL 103 106  CO2 27 24  GLUCOSE 144* 127*  BUN 14 20  CREATININE 1.14 1.04  CALCIUM 9.2 8.6*   Liver Function Tests:  Recent Labs Lab 04/01/17 0441  AST 26  ALT 24  ALKPHOS 45  BILITOT 0.6  PROT 7.0  ALBUMIN 3.7   No results for input(s): LIPASE, AMYLASE in the last 168 hours. No results for input(s): AMMONIA in the last 168 hours. CBC:  Recent Labs Lab 04/01/17 0441  WBC 7.8  NEUTROABS 6.2  HGB 13.0  HCT 39.7  MCV 86.5  PLT 166   Cardiac Enzymes: No results for input(s): CKTOTAL, CKMB, CKMBINDEX, TROPONINI in the last 168 hours. BNP: Invalid input(s): POCBNP CBG:  Recent Labs Lab 04/04/17 1150 04/04/17 1724 04/04/17 2108 04/05/17 0756 04/05/17 1244  GLUCAP 237* 135* 228* 155* 193*   D-Dimer No results for input(s): DDIMER in the last 72 hours. Hgb A1c No results for input(s): HGBA1C in the last 72 hours. Lipid Profile No results for input(s): CHOL, HDL, LDLCALC, TRIG, CHOLHDL, LDLDIRECT in the last 72 hours. Thyroid function studies No results for input(s): TSH, T4TOTAL, T3FREE, THYROIDAB in the  last 72 hours.  Invalid input(s): FREET3 Anemia work up No results for input(s): VITAMINB12, FOLATE, FERRITIN, TIBC, IRON, RETICCTPCT in the last 72 hours. Urinalysis    Component Value Date/Time   COLORURINE YELLOW 04/01/2017 0638   APPEARANCEUR CLEAR 04/01/2017 0638   LABSPEC 1.011 04/01/2017 0638   PHURINE 6.0 04/01/2017 0638   GLUCOSEU NEGATIVE 04/01/2017 0638   HGBUR NEGATIVE 04/01/2017 0638   BILIRUBINUR NEGATIVE 04/01/2017 0638   KETONESUR NEGATIVE 04/01/2017 0638   PROTEINUR NEGATIVE 04/01/2017 0638   NITRITE NEGATIVE 04/01/2017 0638   LEUKOCYTESUR NEGATIVE 04/01/2017 0638   Sepsis Labs Invalid input(s): PROCALCITONIN,  WBC,  LACTICIDVEN Microbiology Recent Results (from the past 240 hour(s))  Culture, blood (Routine X 2) w Reflex to ID Panel  Status: None (Preliminary result)   Collection Time: 04/01/17  4:35 AM  Result Value Ref Range Status   Specimen Description BLOOD LEFT ANTECUBITAL  Final   Special Requests   Final    BOTTLES DRAWN AEROBIC AND ANAEROBIC Blood Culture adequate volume   Culture NO GROWTH 4 DAYS  Final   Report Status PENDING  Incomplete  Culture, blood (Routine X 2) w Reflex to ID Panel     Status: None (Preliminary result)   Collection Time: 04/01/17  4:45 AM  Result Value Ref Range Status   Specimen Description BLOOD RIGHT ANTECUBITAL  Final   Special Requests   Final    BOTTLES DRAWN AEROBIC AND ANAEROBIC Blood Culture adequate volume   Culture NO GROWTH 4 DAYS  Final   Report Status PENDING  Incomplete  Respiratory Panel by PCR     Status: Abnormal   Collection Time: 04/01/17  4:55 AM  Result Value Ref Range Status   Adenovirus NOT DETECTED NOT DETECTED Final   Coronavirus 229E NOT DETECTED NOT DETECTED Final   Coronavirus HKU1 NOT DETECTED NOT DETECTED Final   Coronavirus NL63 NOT DETECTED NOT DETECTED Final   Coronavirus OC43 NOT DETECTED NOT DETECTED Final   Metapneumovirus NOT DETECTED NOT DETECTED Final   Rhinovirus /  Enterovirus DETECTED (A) NOT DETECTED Final   Influenza A NOT DETECTED NOT DETECTED Final   Influenza B NOT DETECTED NOT DETECTED Final   Parainfluenza Virus 1 NOT DETECTED NOT DETECTED Final   Parainfluenza Virus 2 NOT DETECTED NOT DETECTED Final   Parainfluenza Virus 3 NOT DETECTED NOT DETECTED Final   Parainfluenza Virus 4 NOT DETECTED NOT DETECTED Final   Respiratory Syncytial Virus NOT DETECTED NOT DETECTED Final   Bordetella pertussis NOT DETECTED NOT DETECTED Final   Chlamydophila pneumoniae NOT DETECTED NOT DETECTED Final   Mycoplasma pneumoniae NOT DETECTED NOT DETECTED Final  Rapid strep screen (not at [facility])     Status: None   Collection Time: 04/01/17  4:55 AM  Result Value Ref Range Status   Streptococcus, Group A Screen (Direct) NEGATIVE NEGATIVE Final    Comment: (NOTE) A Rapid Antigen test may result negative if the antigen level in the sample is below the detection level of this test. The FDA has not cleared this test as a stand-alone test therefore the rapid antigen negative result has reflexed to a Group A Strep culture.   Culture, group A strep     Status: None   Collection Time: 04/01/17  4:55 AM  Result Value Ref Range Status   Specimen Description THROAT  Final   Special Requests NONE Reflexed from [id number]  Final   Culture NO GROUP A STREP (S.PYOGENES) ISOLATED  Final   Report Status 04/03/2017 FINAL  Final  Urine Culture     Status: Abnormal   Collection Time: 04/01/17  6:38 AM  Result Value Ref Range Status   Specimen Description URINE, CLEAN CATCH  Final   Special Requests NONE  Final   Culture MULTIPLE SPECIES PRESENT, SUGGEST RECOLLECTION (A)  Final   Report Status 04/02/2017 FINAL  Final    Time coordinating discharge: 30 minutes  SIGNED:  Chipper Oman, MD  [facility] 04/05/2017, 3:22 PM  Pager please text page via  [email or web address] Password [email or web address]

## 2017-04-06 LAB — CULTURE, BLOOD (ROUTINE X 2)
CULTURE: NO GROWTH
CULTURE: NO GROWTH
SPECIAL REQUESTS: ADEQUATE
Special Requests: ADEQUATE

## 2017-04-07 MED ORDER — AZATHIOPRINE 50 MG PO TABS
100.0000 mg | ORAL_TABLET | Freq: Two times a day (BID) | ORAL | 11 refills | Status: DC
Start: 2017-04-07 — End: 2018-01-15

## 2017-04-07 NOTE — Telephone Encounter (Addendum)
Pt called in medication will cost him $153/mth, he cannot afford this. What would be an alternative medication? Please call to discuss at [phone]

## 2017-04-07 NOTE — Addendum Note (Signed)
Addended by: Marcial Pacas on: 04/07/2017 05:18 PM   Modules accepted: Orders

## 2017-04-07 NOTE — Telephone Encounter (Addendum)
The insurance will not pay for CellCept, will change to Immuran, he weight 214, roughly 100kg, x2=200mg  daily.  Imuran 50mg  tabs one tab twice for one week, then 2 tabs bid.  Common side effect of GI side effect, at the beginning of the medication, he should take the medicine after meal, occasionally abnormal liver function tests, decreased white count, will follow-up laboratory evaluations, it might take 4-6 months to reach full treatment benefit.

## 2017-04-07 NOTE — Telephone Encounter (Signed)
He is unable to afford CellCept 577m, BID.

## 2017-04-08 ENCOUNTER — Telehealth: Payer: Self-pay | Admitting: Neurology

## 2017-04-08 NOTE — Telephone Encounter (Signed)
Spoke to patient - discussed the new medication with him as stated below.  He is agreeable to the change.  [facility] informed me that his co-pay is only $8.00.

## 2017-04-08 NOTE — Telephone Encounter (Signed)
error 

## 2017-04-28 ENCOUNTER — Telehealth: Payer: Self-pay | Admitting: Neurology

## 2017-04-28 NOTE — Telephone Encounter (Signed)
Rn call patient about his phone call from this morning. Pt stated he does not think his blood pressure is correct. PT gave the blood pressure readings from the last week.  Rn stated his blood pressure readings should be address by his [place] doctor in [place] if he has concerns.  Blood sugars are handle by his [place] doctor. Rn stated his blood sugars are not elevate to cause concern Rn recommend he continue monitoring his blood glucose and call his [place] doctor.   Pt stated he is urinating a lot, and is fatigue,and having trouble sleeping.He also feels hot and cold.. Pt was recently diagnose with MG and was first seen in July 2018 by our office. Rn stated a message will be sent to Dr. Krista Blue about the hot/cold, trouble sleeping, fatigue,and no energy. PT verbalized understanding.

## 2017-04-28 NOTE — Telephone Encounter (Signed)
Patient called office in reference to his blood sugar and blood pressure being elevated since last week, having trouble walking (walking with a cane), trouble sleeping (hot/cold), and really tired having no energy to get up and want to do anything,increase in urine frequency.  BS readings 04/24/17-132, 04/25/17-129, 04/26/17-130, 04/27/17-138.  BP readings 04/23/17- 140/79, 04/24/17-142/72, 04/25/17-150/74, 04/26/17-148/76, 04/27/17-160/79, 04/28/17-159/80.  Patient states he recently got a new BP cuff from [place] and is a little leery if the readings are correct. [place].  Please call

## 2017-04-28 NOTE — Telephone Encounter (Signed)
Returned call to patient to offer an appt this week.  Stated he plans to see his PCP on 04/29/17 and will call back if his PCP feels he needs to be seen by Dr. Krista Blue.

## 2017-04-28 NOTE — Telephone Encounter (Signed)
Please check on patient, may move up his appt

## 2017-05-05 NOTE — Telephone Encounter (Signed)
Mrs. Thon called office stating patients PCP does want him to be seen sooner due to not doing well at all.  BP running high, BS running high, no energy, urine frequency.  Where are we able to work him in sooner?  Please call

## 2017-05-06 NOTE — Telephone Encounter (Signed)
Dr. Krista Blue would like to evaluate this patient in the office.  Returned call - spoke to patient and his wife.  He has been worked into Dr. Rhea Belton schedule on 05/07/17 at 8:45am.

## 2017-05-06 NOTE — Telephone Encounter (Signed)
Pt's wife called requesting a call back.

## 2017-05-07 ENCOUNTER — Ambulatory Visit (INDEPENDENT_AMBULATORY_CARE_PROVIDER_SITE_OTHER): Payer: Medicare Other | Admitting: Neurology

## 2017-05-07 ENCOUNTER — Encounter: Payer: Self-pay | Admitting: Neurology

## 2017-05-07 VITALS — BP 141/83 | HR 92 | Ht 71.0 in | Wt 210.0 lb

## 2017-05-07 DIAGNOSIS — M6281 Muscle weakness (generalized): Secondary | ICD-10-CM | POA: Insufficient documentation

## 2017-05-07 DIAGNOSIS — R531 Weakness: Secondary | ICD-10-CM | POA: Insufficient documentation

## 2017-05-07 DIAGNOSIS — G7001 Myasthenia gravis with (acute) exacerbation: Secondary | ICD-10-CM | POA: Diagnosis not present

## 2017-05-07 DIAGNOSIS — G7000 Myasthenia gravis without (acute) exacerbation: Secondary | ICD-10-CM | POA: Diagnosis not present

## 2017-05-07 MED ORDER — ONDANSETRON 4 MG PO TBDP: 4.0000 mg | ORAL_TABLET | Freq: Three times a day (TID) | ORAL | 6 refills | Status: DC | PRN

## 2017-05-07 MED ORDER — OXYBUTYNIN CHLORIDE ER 5 MG PO TB24: 5.0000 mg | ORAL_TABLET | Freq: Every day | ORAL | 11 refills | Status: DC

## 2017-05-07 NOTE — Progress Notes (Signed)
PATIENT: Bob Warren DOB: 1946/06/17  Chief Complaint  Patient presents with  . Myasthenia Gravis    He is here with his wife, [name].  Reports symptoms of decreased appetite, nausea, extreme fatigue, problems sleeping, shortness of breath (with or without physical activity), worsening gait, having hot/cold sensations and urinary frequency.  He also reports both blood pressure and glucose having been running high.  He has been seen by his PCP and instructed to come here for early follow up for further evaluation.     HISTORICAL  Bob Warren is a 71 years old right-handed male, seen in refer by  my colleague Dr. Jaynee Eagles to continue follow-up for his myasthenia gravis.  He was seen by Dr. Jaynee Eagles on February 21 2017 as a referral from [name], MD for left upper eyelid droop.  He has past medical history of type 2 diabetes, hypertension, hyperlipidemia.   He noted left eyelid droopy around February 08 2017, intermittent, worse at nighttime, fatigue, at its worst, left upper eyelid would cover half of the left eye, with mild blocking of his left vision. he denies double vision, no swallowing difficulty, no limb muscle weakness noted.  Since initial visit, laboratory evaluations in July 2018 showed positive acetylcholine modulating antibody, binding antibody,Normal TSH, CPK, CBC, CMP with exception of mild elevated glucose 147,   He was also getting a prescription of Mestinon 60 mg 3 times a day, he tolerated the medication well, with Mestinon treatment, he barely notice any significant droopy eyelid. Personally reviewed MRI of the brain August 2018, moderate atrophy, supratentorium small vessel disease. 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 Repetitive nerve stimulation March 06 2017 of right abductor digital minimum show no significant abnormality  On today's examination, he was noted to have  mild double vision, bulbar, proximal upper and lower extremity weakness,   UPDATE May 07 2017:  He was admitted to the hospital in September for 71, complains of generalized weakness, gait abnormality, was diagnosed with positive rhinovirus by respiratory PCR, received IVIG treatment, showed moderate improvement,  I reviewed the laboratory evaluation in 2018, negative RPR, glucose was elevated 160, fasting lipid profile showed LDL of 87, cholesterol 151, A1c was 7.4, normal TSH,   Laboratory evaluation in October 2018 from his primary care doctor, normal WBC 5, hemoglobin was 12.5, CMP showed elevated glucose of 198, creatinine of 0.8, AST was mildly elevated 89, alkaline phosphate was 128, TSH was mildly elevated 3.4, urine analysis was negative  Chest x-ray in April 01 2017 showed no significant abnormality,  Since hospital admission on April 05 2017, he continued to feel weak, nauseous, difficulty walking, frequent urination about once hour, also complains of elevated glucose 160s  REVIEW OF SYSTEMS: Full 14 system review of systems performed and notable only for as above Appetite change, fatigue, excessive sweaty, cough, shortness of breath, incontinence of bladder, frequent urination, back pain, achy muscles, walking difficulty, weakness  ALLERGIES: Allergies  Allergen Reactions  . Lisinopril     Other reaction(s): Other (See Comments) Cough    HOME MEDICATIONS: Current Outpatient Prescriptions  Medication Sig Dispense Refill  . amLODipine (NORVASC) 10 MG tablet Take 10 mg by mouth.    Marland Kitchen aspirin 81 MG chewable tablet Chew 81 mg by mouth.    Marland Kitchen atorvastatin (LIPITOR) 20 MG tablet Take 20 mg by mouth.    . azaTHIOprine (IMURAN) 50 MG tablet Take 2 tablets (100  mg total) by mouth 2 (two) times daily. One tab po twice a day for one week, then 2 tabs twice a day 120 tablet 11  . Cholecalciferol (VITAMIN D3) 1000 units CAPS Take by mouth.    . hydrochlorothiazide (HYDRODIURIL)  25 MG tablet Take 25 mg by mouth daily.    . Insulin Glargine (LANTUS [place]) Inject 28 Units into the skin daily.    Marland Kitchen losartan (COZAAR) 25 MG tablet Take 25 mg by mouth 2 (two) times daily.     . metFORMIN (GLUCOPHAGE) 850 MG tablet Take 850 mg by mouth.    . Omega-3 Fatty Acids (FISH OIL) 1000 MG CAPS Take 2 capsules by mouth daily.    Marland Kitchen pyridostigmine (MESTINON) 60 MG tablet Take 1 tablet (60 mg total) by mouth 3 (three) times daily. 90 tablet 6   No current facility-administered medications for this visit.     PAST MEDICAL HISTORY: Past Medical History:  Diagnosis Date  . Diabetes ([place])   . High cholesterol   . Hypertension   . Myasthenia gravis ([place])     PAST SURGICAL HISTORY: Past Surgical History:  Procedure Laterality Date  . SKIN CANCER EXCISION Right 2016   Arm    FAMILY HISTORY: Family History  Problem Relation Age of Onset  . Cancer Mother   . Other Father        Gunshot wound  . Diabetes Maternal Grandfather     SOCIAL HISTORY:  Social History   Social History  . Marital status: Married    Spouse name: N/A  . Number of children: 2  . Years of education: 12   Occupational History  . Retired    Social History Main Topics  . Smoking status: Former Research scientist (life sciences)  . Smokeless tobacco: Never Used     Comment: Quit 25 yrs ago  . Alcohol use No     Comment: Quit 25 yrs ago  . Drug use: No  . Sexual activity: Not on file   Other Topics Concern  . Not on file   Social History Narrative   Lives at home w/ his wife   Right-handed   Caffeine: 3-6 cups of coffee per day        PHYSICAL EXAM   Vitals:   05/07/17 0852  BP: (!) 141/83  Pulse: 92  Weight: 210 lb (95.3 kg)  Height: 5\' 11"  (1.803 m)    Not recorded      Body mass index is 29.29 kg/m.  PHYSICAL EXAMNIATION:  Gen: NAD, conversant, well nourised, obese, well groomed                     Cardiovascular: Regular rate rhythm, no peripheral edema, warm, nontender. Eyes: Conjunctivae  clear without exudates or hemorrhage Neck: Supple, no carotid bruits. Pulmonary: Clear to auscultation bilaterally   NEUROLOGICAL EXAM:  MENTAL STATUS: Speech:    Speech is normal; fluent and spontaneous with normal comprehension.  Cognition:     Orientation to time, place and person     Normal recent and remote memory     Normal Attention span and concentration     Normal Language, naming, repeating,spontaneous speech     Fund of knowledge   CRANIAL NERVES: CN II: Visual fields are full to confrontation. Fundoscopic exam is normal with sharp discs and no vascular changes. Pupils are round equal and briskly reactive to light. CN III, IV, VI: extraocular movement are normal. Cover and uncover testing showed mild bilateral exophoria,  red lens testing showed mild weakness at right upper and lower gaze muscles, CN V: Facial sensation is intact to pinprick in all 3 divisions bilaterally. Corneal responses are intact.  CN VII: Face is symmetric, he has mild bilateral eye closure cheek puff muscle weakness. Fatigable bilateral ptosis, left worse than right CN VIII: Hearing is normal to rubbing fingers CN IX, X: Palate elevates symmetrically. Phonation is normal. CN XI: Head turning and shoulder shrug are intact CN XII: Tongue is midline with normal movements and no atrophy.  MOTOR: He has mild neck flexion extension weakness, mild bilateral shoulder abduction, hip flexion weakness  REFLEXES: Reflexes are 2+ and symmetric at the biceps, triceps, knees, and ankles. Plantar responses are flexor.  SENSORY: Intact to light touch, pinprick, positional sensation and vibratory sensation are intact in fingers and toes.  COORDINATION: Rapid alternating movements and fine finger movements are intact. There is no dysmetria on finger-to-nose and heel-knee-shin.    GAIT/STANCE: He is able to get up from seated position arm crossed   DIAGNOSTIC DATA (LABS, IMAGING, TESTING) - I reviewed patient  records, labs, notes, testing and imaging myself where available.   ASSESSMENT AND PLAN  Ladarrious Kirksey is a 71 y.o. male   Seropositive generalized myasthenia gravis,  He was noted to have mild bulbar, proximal limb muscle weakness,  Positive acetylcholine binding antibody  Responded well to Mestinon 60 mg 3 times a day  Not a good candidate for long-term prednisone treatment due to diabetes,  Insurance will not cover CellCept 500 mg twice a day, he is taking Imuran 50 mg 2 tablets twice a day since April 07 2017  New onset generalized fatigue, worsening weakness gait abnormality, no signs of active infection, by CBC, chest x-ray, UA, could due to myasthenia gravis exacerbation, on examination, he did have increased neck flexion, proximal upper and lower extremity weakness, will proceed with IVIG, 2 g/kg divided into 2 days as a loading dose, and then 1 g/kg divided in 2 days every month as maintenance dose x5  Marcial Pacas, M.D. Ph.D.  [facility] [address], [place], [place] [address] Ph: [phone] Fax: [phone]  CC: Referring Provider

## 2017-05-08 ENCOUNTER — Telehealth: Payer: Self-pay | Admitting: Neurology

## 2017-05-08 DIAGNOSIS — R799 Abnormal finding of blood chemistry, unspecified: Secondary | ICD-10-CM

## 2017-05-08 LAB — HEPATITIS PANEL, ACUTE
Hep A IgM: NEGATIVE
Hep B C IgM: NEGATIVE
Hep C Virus Ab: <0.1 s/co ratio (ref 0.0–0.9)
Hepatitis B Surface Ag: NEGATIVE

## 2017-05-08 LAB — HEPATIC FUNCTION PANEL
ALBUMIN: 3.9 g/dL (ref 3.5–4.8)
ALK PHOS: 174 IU/L — AB (ref 39–117)
ALT: 45 IU/L — ABNORMAL HIGH (ref 0–44)
AST: 70 IU/L — AB (ref 0–40)
BILIRUBIN TOTAL: 1.2 mg/dL (ref 0.0–1.2)
BILIRUBIN, DIRECT: 0.65 mg/dL — AB (ref 0.00–0.40)
Total Protein: 7.4 g/dL (ref 6.0–8.5)

## 2017-05-08 NOTE — Telephone Encounter (Signed)
Please call patient, laboratory evaluation showed abnormal liver functional test, elevated alkaline phosphate, AST, ALT  Liver functional test was normal a month ago, the abnormal liver functional test could due to Imuran use, but only mildly elevated  Please suggest him keep on current dose of medications, will arrange IVIG treatment, have repeat laboratory evaluation at that visit,

## 2017-05-12 NOTE — Telephone Encounter (Signed)
I called pt to discuss. Spoke with pt's wife, [name], per DPR, asked her to have pt call me when he is available.

## 2017-05-12 NOTE — Telephone Encounter (Signed)
Please get his medical record from [facility].

## 2017-05-12 NOTE — Telephone Encounter (Signed)
Pt returned my call. I advised him that Dr. Krista Blue reviewed his labs, and found that pt's liver function tests were abnormal, but one month ago, they were normal. Dr. Krista Blue reports that the abnormal LFT may be from the imuran. Dr. Krista Blue still wants to arrange for IVIG treatments and repeat LFTs at the IVIG appt at [facility]. Pt says that he was released from [facility] yesterday (there are some records from this in careeverywhere) for heart problems. Pt wants to make sure that Dr. Krista Blue is aware of this before proceeding with the IVIG treatments.

## 2017-05-14 ENCOUNTER — Other Ambulatory Visit: Payer: Medicare Other

## 2017-05-14 ENCOUNTER — Telehealth: Payer: Self-pay | Admitting: Neurology

## 2017-05-14 NOTE — Telephone Encounter (Signed)
Pt calling to inform that he had a CT Scan appointment this morning but he just got out the hospital with a heart monitor that he has to wear for 14 days.  Pt states he will have to reschedule the CT Scan after he is done with the heart monitor.  Pt is not asking for a call back

## 2017-06-02 ENCOUNTER — Ambulatory Visit
Admission: RE | Admit: 2017-06-02 | Discharge: 2017-06-02 | Disposition: A | Discharge status: Home / Self Care | Payer: Medicare Other | Source: Ambulatory Visit | Attending: Neurology | Admitting: Neurology

## 2017-06-02 DIAGNOSIS — M6281 Muscle weakness (generalized): Secondary | ICD-10-CM

## 2017-07-03 ENCOUNTER — Encounter: Payer: Self-pay | Admitting: Neurology

## 2017-07-03 ENCOUNTER — Ambulatory Visit: Payer: Medicare Other | Admitting: Neurology

## 2017-07-03 VITALS — BP 119/71 | HR 63 | Ht 71.0 in | Wt 198.5 lb

## 2017-07-03 DIAGNOSIS — G7001 Myasthenia gravis with (acute) exacerbation: Secondary | ICD-10-CM

## 2017-07-03 NOTE — Progress Notes (Signed)
PATIENT: Bob Warren DOB: 1945/10/15  Chief Complaint  Patient presents with  . Myasthenia Gravis    He is here with his wife, Bob Warren.  He completed his loading dose of IVIG last month and he starts his maintenance dose next week.  He has several concerns:  diarrhea (Pepto Bismol not working, mild improvement when PCP discontinued Metformin), weight loss, urinary frequency, muscle cramps (especially at night), excessive sweating, skin discoloration and fatigue.      HISTORICAL  Bob Warren is a 71 years old right-handed male, seen in refer by  my colleague Dr. Jaynee Warren to continue follow-up for his myasthenia gravis.  He was seen by Dr. Jaynee Warren on February 21 2017 as a referral from Bob Bott, MD for left upper eyelid droop.  He has past medical history of type 2 diabetes, hypertension, hyperlipidemia.   He noted left eyelid droopy around February 08 2017, intermittent, worse at nighttime, fatigue, at its worst, left upper eyelid would cover half of the left eye, with mild blocking of his left vision. he denies double vision, no swallowing difficulty, no limb muscle weakness noted.  Since initial visit, laboratory evaluations in July 2018 showed positive acetylcholine modulating antibody, binding antibody, normal TSH, CPK, CBC, CMP with exception of mild elevated glucose 147,   He was also getting a prescription of Mestinon 60 mg 3 times a day, he tolerated the medication well, with Mestinon treatment, he barely notice any significant droopy eyelid. Personally reviewed MRI of the brain August 2018, moderate atrophy, supratentorium small vessel disease. 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  Repetitive nerve stimulation March 06 2017 of right abductor digital minimum show no significant abnormality  On today's examination, he was noted to have mild double vision, bulbar, proximal upper and lower  extremity weakness,   UPDATE May 07 2017:  He was admitted to the hospital in September for 2018, complains of generalized weakness, gait abnormality, was diagnosed with positive rhinovirus by respiratory PCR, received IVIG treatment, showed moderate improvement,  I reviewed the laboratory evaluation in 2018, negative RPR, glucose was elevated 160, fasting lipid profile showed LDL of 87, cholesterol 151, A1c was 7.4, normal TSH,  Laboratory evaluation in October 2018 from his primary care doctor, normal WBC 5, hemoglobin was 12.5, CMP showed elevated glucose of 198, creatinine of 0.8, AST was mildly elevated 89, alkaline phosphate was 128, TSH was mildly elevated 3.4, urine analysis was negative  Chest x-ray in April 01 2017 showed no significant abnormality,  Since hospital admission on April 05 2017, he continued to feel weak, nauseous, difficulty walking, frequent urination about once hour, also complains of elevated glucose 160s  UPDATE Dec 6th 2018: CT chest on November 01/15/2017 showed no thymus abnormality, area of scarring and atelectasis in the inferior lingular, and left lower lobe He feel weaker, all over, no double vision, no droopy eye lid, word finding difficulty, no dysphagia, no SOB,   He is taking Mestinon 60 mg 3 times a day, complains of frequent diarrhea, feeling fatigued,  He received IVIG 2 g/kg on November 12, 13, 14, with mild improvement, planning on to receive it again on December 11, and 12, he hopes to transfer his IVIG treatment locally.   REVIEW OF SYSTEMS: Full 14 system review of systems performed and notable only for: Appetite change, fatigue, excessive sweating, runny nose, leg swelling, black stool, diarrhea, sleepiness, back pain, walking difficulty, bruise  easily  ALLERGIES: Allergies  Allergen Reactions  . Lisinopril     Other reaction(s): Other (See Comments) Cough    HOME MEDICATIONS: Current Outpatient Medications  Medication Sig  Dispense Refill  . amLODipine (NORVASC) 10 MG tablet Take 10 mg by mouth.    Marland Kitchen aspirin 81 MG chewable tablet Chew 81 mg by mouth.    Marland Kitchen atorvastatin (LIPITOR) 20 MG tablet Take 20 mg by mouth.    . azaTHIOprine (IMURAN) 50 MG tablet Take 2 tablets (100 mg total) by mouth 2 (two) times daily. One tab po twice a day for one week, then 2 tabs twice a day 120 tablet 11  . Cholecalciferol (VITAMIN D3) 1000 units CAPS Take by mouth.    . hydrochlorothiazide (HYDRODIURIL) 25 MG tablet Take 25 mg by mouth daily.    . Insulin Glargine (LANTUS [place]) Inject 28 Units into the skin daily.    Marland Kitchen losartan (COZAAR) 25 MG tablet Take 25 mg by mouth 2 (two) times daily.     . metFORMIN (GLUCOPHAGE) 850 MG tablet Take 850 mg by mouth.    . Omega-3 Fatty Acids (FISH OIL) 1000 MG CAPS Take 2 capsules by mouth daily.    . ondansetron (ZOFRAN ODT) 4 MG disintegrating tablet Take 1 tablet (4 mg total) by mouth every 8 (eight) hours as needed. 30 tablet 6  . oxybutynin (DITROPAN-XL) 5 MG 24 hr tablet Take 1 tablet (5 mg total) by mouth at bedtime. 30 tablet 11  . pyridostigmine (MESTINON) 60 MG tablet Take 1 tablet (60 mg total) by mouth 3 (three) times daily. 90 tablet 6   No current facility-administered medications for this visit.     PAST MEDICAL HISTORY: Past Medical History:  Diagnosis Date  . Diabetes ([place])   . High cholesterol   . Hypertension   . Myasthenia gravis ([place])     PAST SURGICAL HISTORY: Past Surgical History:  Procedure Laterality Date  . SKIN CANCER EXCISION Right 2016   Arm    FAMILY HISTORY: Family History  Problem Relation Age of Onset  . Cancer Mother   . Other Father        Gunshot wound  . Diabetes Maternal Grandfather     SOCIAL HISTORY:  Social History   Socioeconomic History  . Marital status: Married    Spouse name: Not on file  . Number of children: 2  . Years of education: 73  . Highest education level: Not on file  Social Needs  . Financial resource strain:  Not on file  . Food insecurity - worry: Not on file  . Food insecurity - inability: Not on file  . Transportation needs - medical: Not on file  . Transportation needs - non-medical: Not on file  Occupational History  . Occupation: Retired  Tobacco Use  . Smoking status: Former Research scientist (life sciences)  . Smokeless tobacco: Never Used  . Tobacco comment: Quit 25 yrs ago  Substance and Sexual Activity  . Alcohol use: No    Comment: Quit 25 yrs ago  . Drug use: No  . Sexual activity: Not on file  Other Topics Concern  . Not on file  Social History Narrative   Lives at home w/ his wife   Right-handed   Caffeine: 3-6 cups of coffee per day     PHYSICAL EXAM   Vitals:   07/03/17 1141  BP: 119/71  Pulse: 63  Weight: 198 lb 8 oz (90 kg)  Height: 5\' 11"  (1.803 m)  Not recorded      Body mass index is 27.69 kg/m.  PHYSICAL EXAMNIATION:  Gen: NAD, conversant, well nourised, obese, well groomed                     Cardiovascular: Regular rate rhythm, no peripheral edema, warm, nontender. Eyes: Conjunctivae clear without exudates or hemorrhage Neck: Supple, no carotid bruits. Pulmonary: Clear to auscultation bilaterally   NEUROLOGICAL EXAM:  MENTAL STATUS: Speech:    Speech is normal; fluent and spontaneous with normal comprehension.  Cognition:     Orientation to time, place and person     Normal recent and remote memory     Normal Attention span and concentration     Normal Language, naming, repeating,spontaneous speech     Fund of knowledge   CRANIAL NERVES: CN II: Visual fields are full to confrontation. Fundoscopic exam is normal with sharp discs and no vascular changes. Pupils are round equal and briskly reactive to light. CN III, IV, VI: extraocular movement are normal. Cover and uncover testing showed mild bilateral exophoria, red lens testing showed mild weakness at right upper and lower gaze muscles, CN V: Facial sensation is intact to pinprick in all 3 divisions  bilaterally. Corneal responses are intact.  CN VII: Face is symmetric, he has mild bilateral eye closure cheek puff muscle weakness. Fatigable bilateral ptosis, left worse than right CN VIII: Hearing is normal to rubbing fingers CN IX, X: Palate elevates symmetrically. Phonation is normal. CN XI: Head turning and shoulder shrug are intact CN XII: Tongue is midline with normal movements and no atrophy.  MOTOR: He has no neck flexion extension weakness, mild bilateral shoulder abduction, hip flexion weakness  REFLEXES: Reflexes are 2+ and symmetric at the biceps, triceps, knees, and ankles. Plantar responses are flexor.  SENSORY: Intact to light touch, pinprick, positional sensation and vibratory sensation are intact in fingers and toes.  COORDINATION: Rapid alternating movements and fine finger movements are intact. There is no dysmetria on finger-to-nose and heel-knee-shin.    GAIT/STANCE: He is able to get up from seated position arm crossed   DIAGNOSTIC DATA (LABS, IMAGING, TESTING) - I reviewed patient records, labs, notes, testing and imaging myself where available.   ASSESSMENT AND PLAN  Bob Warren is a 71 y.o. male   Seropositive generalized myasthenia gravis,  He was noted to have mild bulbar, proximal limb muscle weakness,  Positive acetylcholine binding antibody  Complains of frequent diarrhea, stop Mestinon 60 mg 3 times a day  Not a good candidate for long-term prednisone treatment due to diabetes,  Insurance will not cover CellCept 500 mg twice a day, he is taking Imuran 50 mg 2 tablets twice a day since April 07 2017 previously abnormal liver functional test, negative hepatitis panel, repeat laboratory evaluations,  Continue IVIG treatment  Bob Warren, M.D. Ph.D.  [facility] [address], [place], [place] [address] Ph: [phone] Fax: [phone]  CC: Referring Provider

## 2017-07-04 ENCOUNTER — Telehealth: Payer: Self-pay | Admitting: Neurology

## 2017-07-04 DIAGNOSIS — R7989 Other specified abnormal findings of blood chemistry: Secondary | ICD-10-CM

## 2017-07-04 DIAGNOSIS — R945 Abnormal results of liver function studies: Secondary | ICD-10-CM

## 2017-07-04 LAB — CBC WITH DIFFERENTIAL/PLATELET
BASOS ABS: 0.1 10*3/uL (ref 0.0–0.2)
BASOS: 2 %
EOS (ABSOLUTE): 0.1 10*3/uL (ref 0.0–0.4)
Eos: 2 %
HEMATOCRIT: 35.7 % — AB (ref 37.5–51.0)
Hemoglobin: 12.1 g/dL — ABNORMAL LOW (ref 13.0–17.7)
IMMATURE GRANS (ABS): 0 10*3/uL (ref 0.0–0.1)
IMMATURE GRANULOCYTES: 0 %
LYMPHS: 32 %
Lymphocytes Absolute: 0.9 10*3/uL (ref 0.7–3.1)
MCH: 37 pg — ABNORMAL HIGH (ref 26.6–33.0)
MCHC: 33.9 g/dL (ref 31.5–35.7)
MCV: 109 fL — AB (ref 79–97)
MONOS ABS: 0.2 10*3/uL (ref 0.1–0.9)
Monocytes: 5 %
NEUTROS ABS: 1.7 10*3/uL (ref 1.4–7.0)
NEUTROS PCT: 59 %
PLATELETS: 167 10*3/uL (ref 150–379)
RBC: 3.27 x10E6/uL — ABNORMAL LOW (ref 4.14–5.80)
RDW: 23.1 % — AB (ref 12.3–15.4)
WBC: 2.8 10*3/uL — ABNORMAL LOW (ref 3.4–10.8)

## 2017-07-04 LAB — COMPREHENSIVE METABOLIC PANEL
A/G RATIO: 0.8 — AB (ref 1.2–2.2)
ALT: 115 IU/L — AB (ref 0–44)
AST: 149 IU/L — AB (ref 0–40)
Albumin: 2.8 g/dL — ABNORMAL LOW (ref 3.5–4.8)
Alkaline Phosphatase: 107 IU/L (ref 39–117)
BILIRUBIN TOTAL: 5.9 mg/dL — AB (ref 0.0–1.2)
BUN/Creatinine Ratio: 19 (ref 10–24)
BUN: 22 mg/dL (ref 8–27)
CHLORIDE: 102 mmol/L (ref 96–106)
CO2: 23 mmol/L (ref 20–29)
Calcium: 8.7 mg/dL (ref 8.6–10.2)
Creatinine, Ser: 1.13 mg/dL (ref 0.76–1.27)
GFR calc Af Amer: 75 mL/min/{1.73_m2} (ref >59)
GFR calc non Af Amer: 65 mL/min/{1.73_m2} (ref >59)
GLOBULIN, TOTAL: 3.4 g/dL (ref 1.5–4.5)
Glucose: 303 mg/dL — ABNORMAL HIGH (ref 65–99)
POTASSIUM: 3.4 mmol/L — AB (ref 3.5–5.2)
SODIUM: 140 mmol/L (ref 134–144)
Total Protein: 6.2 g/dL (ref 6.0–8.5)

## 2017-07-04 LAB — HGB A1C W/O EAG: Hgb A1c MFr Bld: 7.7 % — ABNORMAL HIGH (ref 4.8–5.6)

## 2017-07-04 LAB — THYROID PANEL WITH TSH
Free Thyroxine Index: 3.3 (ref 1.2–4.9)
T3 UPTAKE RATIO: 27 % (ref 24–39)
T4, Total: 12.4 ug/dL — ABNORMAL HIGH (ref 4.5–12.0)
TSH: 1.3 u[IU]/mL (ref 0.450–4.500)

## 2017-07-04 NOTE — Telephone Encounter (Signed)
I have called patient, repeat laboratory evaluation showed elevated A1c 7.7, continued worsening abnormal liver functional test, decreased WBC, increased MCV,  I have suggested him to stop Mestinon, stop Imuran treatment, before potential side effects from Imuran,  Ultrasound of abdomen,  For the report to his primary care physician, he already has a follow-up scheduled with [place] in March 2019, will have repeat CMP in January 2018  Please call patient to have repeat lab in Jan 2018, CMP, this can be done locally

## 2017-07-09 NOTE — Telephone Encounter (Signed)
Spoke to patient - he will have his repeat CMP lab completed at his PCP office in January and ask them to fax results to Dr. Krista Blue.  His current labwork and ordered repeat CMP have been faxed and confirmed to his PCP, Dr. Heber Hastings.  He is aware to expect a call from [facility] to get his abdomen ultrasound scheduled.

## 2017-07-10 NOTE — Telephone Encounter (Signed)
Referral faxed

## 2017-07-10 NOTE — Telephone Encounter (Signed)
Spoke to patient and he wants to have US abdomen at  [facility] closer to his home.  Telephone [phone] Fax [phone] .  Order's will need to be signed for [facility] .

## 2017-08-04 ENCOUNTER — Telehealth: Payer: Self-pay | Admitting: Neurology

## 2017-08-04 NOTE — Telephone Encounter (Signed)
I have talked with [facility] room physician Dr. Pryor Ochoa,  He presented with dysphagia, worsening neck pain, last IVIG was on December 11 12, 2018, he is due for his IVIG again in January 2019.  Dr. Pryor Ochoa is concerned about exacerbation of myasthenia gravis with his complaining of worsening dysphagia, she will contact neuro hospitalist.

## 2017-08-05 ENCOUNTER — Telehealth: Payer: Self-pay | Admitting: Neurology

## 2017-08-05 NOTE — Telephone Encounter (Signed)
I talked with [facility] Resident,   Patient was admitted for dysphagia, also complains of generalized weakness, but per resident, lacking objective findings, which was also noted on previous examination,  But he received first IVIG for treatment dosing November 2018 2 g/kg, maintenance dose 1 g/kg every 28 days, last infusion was on December 11 and 12  He will receive IVIG during his hospital stay, continue follow-up afterwards

## 2017-08-18 NOTE — Telephone Encounter (Signed)
Lab evaluation on Aug 15 2017, showed normal ALT, Alkaline phosphatase, mild elevated AST 61,   Mild elevated Total bilirubin 1.5.

## 2017-10-02 ENCOUNTER — Encounter: Payer: Self-pay | Admitting: Neurology

## 2017-10-02 ENCOUNTER — Ambulatory Visit: Payer: Medicare Other | Admitting: Neurology

## 2017-10-02 ENCOUNTER — Telehealth: Payer: Self-pay

## 2017-10-02 VITALS — BP 147/74 | HR 76 | Ht 71.0 in | Wt 201.0 lb

## 2017-10-02 DIAGNOSIS — G7000 Myasthenia gravis without (acute) exacerbation: Secondary | ICD-10-CM | POA: Diagnosis not present

## 2017-10-02 MED ORDER — AZATHIOPRINE 50 MG PO TABS: ORAL_TABLET | ORAL | 11 refills | Status: DC

## 2017-10-02 NOTE — Progress Notes (Signed)
PATIENT: Bob Warren DOB: [birth year]  Chief Complaint  Patient presents with  . Follow-up    PCP: Dr. Heber Edgefield  . Myasthenia Gravis    Is doing well with IVIG treatments. Pt is due for his IVIG next week at our office.     HISTORICAL  Bob Warren is a 72 years old right-handed male, seen in refer by  my colleague Dr. Jaynee Eagles to continue follow-up for his myasthenia gravis.  He was seen by Dr. Jaynee Eagles on February 21 2017 as a referral from [name], MD for left upper eyelid droop.  He has past medical history of type 2 diabetes, hypertension, hyperlipidemia.   He noted left eyelid droopy around February 08 2017, intermittent, worse at nighttime, fatigue, at its worst, left upper eyelid would cover half of the left eye, with mild blocking of his left vision. he denies double vision, no swallowing difficulty, no limb muscle weakness noted.  Since initial visit, laboratory evaluations in July 2018 showed positive acetylcholine modulating antibody, binding antibody, normal TSH, CPK, CBC, CMP with exception of mild elevated glucose 147,   He was also getting a prescription of Mestinon 60 mg 3 times a day, he tolerated the medication well, with Mestinon treatment, he barely notice any significant droopy eyelid. Personally reviewed MRI of the brain August 2018, moderate atrophy, supratentorium small vessel disease. 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  Repetitive nerve stimulation March 06 2017 of right abductor digital minimum show no significant abnormality  On today's examination, he was noted to have mild double vision, bulbar, proximal upper and lower extremity weakness,   UPDATE May 07 2017:  He was admitted to the hospital in September for 2018, complains of generalized weakness, gait abnormality, was diagnosed with positive rhinovirus by respiratory PCR, received IVIG treatment, showed  moderate improvement,  I reviewed the laboratory evaluation in 2018, negative RPR, glucose was elevated 160, fasting lipid profile showed LDL of 87, cholesterol 151, A1c was 7.4, normal TSH,  Laboratory evaluation in October 2018 from his primary care doctor, normal WBC 5, hemoglobin was 12.5, CMP showed elevated glucose of 198, creatinine of 0.8, AST was mildly elevated 89, alkaline phosphate was 128, TSH was mildly elevated 3.4, urine analysis was negative  Chest x-ray in April 01 2017 showed no significant abnormality,  Since hospital admission on April 05 2017, he continued to feel weak, nauseous, difficulty walking, frequent urination about once hour, also complains of elevated glucose 160s  UPDATE Dec 6th 2018: CT chest on November 01/15/2017 showed no thymus abnormality, area of scarring and atelectasis in the inferior lingular, and left lower lobe He feel weaker, all over, no double vision, no droopy eye lid, word finding difficulty, no dysphagia, no SOB,   He is taking Mestinon 60 mg 3 times a day, complains of frequent diarrhea, feeling fatigued,  He received IVIG 2 g/kg on November 12, 13, 14, with mild improvement, planning on to receive it again on December 11, and 12, he hopes to transfer his IVIG treatment locally.  UPDATE October 02 2017: He was admitted to trouble he will August 04, 2017, at his worst, Bob Warren stated he could not move, mild dysphagia, per hospital admission, motor strength was 4 out of 5 throughout, he was treated with IVIG, which did help his symptoms,  Last ivig on Feb 12, 13th 2019, will have it month, he is no longer taking Mestinon  due to GI side effect, Imuran was stopped in December 2019 due to abnormal liver functional test,  Repeat laboratory evaluation on August 05, 2017 showed normalization of AST, ALT, alkaline phosphate, continue mild elevated bilirubin,  His diabetes is under suboptimal control,A1C 7.7  He responded very well to IVIG  treatment.   (First IVIG treatment was on November 12, 13, 14, 2018 for total of 2 g/kg, second infusion was on July 14, 2017 2018, 1 g/kg; February 12, 13 2019, 1 g/kg; preauthorization is good until May 20, 2018 through intra-fusion) REVIEW OF SYSTEMS: Full 14 system review of systems performed and notable only for: Appetite change, fatigue, excessive sweating, runny nose, leg swelling, black stool, diarrhea, sleepiness, back pain, walking difficulty, bruise easily  ALLERGIES: Allergies  Allergen Reactions  . Lisinopril     Other reaction(s): Other (See Comments) Cough    HOME MEDICATIONS: Current Outpatient Medications  Medication Sig Dispense Refill  . amLODipine (NORVASC) 10 MG tablet Take 10 mg by mouth.    Marland Kitchen aspirin 81 MG chewable tablet Chew 81 mg by mouth.    Marland Kitchen atorvastatin (LIPITOR) 20 MG tablet Take 20 mg by mouth.    . azaTHIOprine (IMURAN) 50 MG tablet Take 2 tablets (100 mg total) by mouth 2 (two) times daily. One tab po twice a day for one week, then 2 tabs twice a day 120 tablet 11  . Cholecalciferol (VITAMIN D3) 1000 units CAPS Take by mouth.    . hydrochlorothiazide (HYDRODIURIL) 25 MG tablet Take 25 mg by mouth daily.    . Insulin Glargine (LANTUS [place]) Inject 28 Units into the skin daily.    Marland Kitchen losartan (COZAAR) 25 MG tablet Take 25 mg by mouth 2 (two) times daily.     . metFORMIN (GLUCOPHAGE) 850 MG tablet Take 850 mg by mouth.    . Omega-3 Fatty Acids (FISH OIL) 1000 MG CAPS Take 2 capsules by mouth daily.    . ondansetron (ZOFRAN ODT) 4 MG disintegrating tablet Take 1 tablet (4 mg total) by mouth every 8 (eight) hours as needed. 30 tablet 6  . oxybutynin (DITROPAN-XL) 5 MG 24 hr tablet Take 1 tablet (5 mg total) by mouth at bedtime. 30 tablet 11   No current facility-administered medications for this visit.     PAST MEDICAL HISTORY: Past Medical History:  Diagnosis Date  . Diabetes ([employer])   . High cholesterol   . Hypertension   . Myasthenia gravis  ([place])     PAST SURGICAL HISTORY: Past Surgical History:  Procedure Laterality Date  . SKIN CANCER EXCISION Right 2016   Arm    FAMILY HISTORY: Family History  Problem Relation Age of Onset  . Cancer Mother   . Other Father        Gunshot wound  . Diabetes Maternal Grandfather     SOCIAL HISTORY:  Social History   Socioeconomic History  . Marital status: Married    Spouse name: Not on file  . Number of children: 2  . Years of education: 2  . Highest education level: Not on file  Social Needs  . Financial resource strain: Not on file  . Food insecurity - worry: Not on file  . Food insecurity - inability: Not on file  . Transportation needs - medical: Not on file  . Transportation needs - non-medical: Not on file  Occupational History  . Occupation: Retired  Tobacco Use  . Smoking status: Former Research scientist (life sciences)  . Smokeless tobacco: Never Used  .  Tobacco comment: Quit 25 yrs ago  Substance and Sexual Activity  . Alcohol use: No    Comment: Quit 25 yrs ago  . Drug use: No  . Sexual activity: Not on file  Other Topics Concern  . Not on file  Social History Narrative   Lives at home w/ his wife   Right-handed   Caffeine: 3-6 cups of coffee per day     PHYSICAL EXAM   Vitals:   10/02/17 1025  BP: (!) 147/74  Pulse: 76  Weight: 201 lb (91.2 kg)  Height: 5\' 11"  (1.803 m)    Not recorded      Body mass index is 28.03 kg/m.  PHYSICAL EXAMNIATION:  Gen: NAD, conversant, well nourised, obese, well groomed                     Cardiovascular: Regular rate rhythm, no peripheral edema, warm, nontender. Eyes: Conjunctivae clear without exudates or hemorrhage Neck: Supple, no carotid bruits. Pulmonary: Clear to auscultation bilaterally   NEUROLOGICAL EXAM:  MENTAL STATUS: Speech:    Speech is normal; fluent and spontaneous with normal comprehension.  Cognition:     Orientation to time, place and person     Normal recent and remote memory     Normal  Attention span and concentration     Normal Language, naming, repeating,spontaneous speech     Fund of knowledge   CRANIAL NERVES: CN II: Visual fields are full to confrontation.   Pupils are round equal and briskly reactive to light. CN III, IV, VI: extraocular movement are normal. Cover and uncover testing showed mild bilateral exophoria,  CN V: Facial sensation is intact to pinprick in all 3 divisions bilaterally. Corneal responses are intact.  CN VII: Face is symmetric, he has mild bilateral eye closure cheek puff muscle weakness.  CN VIII: Hearing is normal to rubbing fingers CN IX, X: Palate elevates symmetrically. Phonation is normal. CN XI: Head turning and shoulder shrug are intact CN XII: Tongue is midline with normal movements and no atrophy.  MOTOR: He has mild neck flexion extension weakness, mild bilateral shoulder abduction, external rotation weakness  REFLEXES: Reflexes are 2+ and symmetric at the biceps, triceps, knees, and ankles. Plantar responses are flexor.  SENSORY: Intact to light touch, pinprick, positional sensation and vibratory sensation are intact in fingers and toes.  COORDINATION: Rapid alternating movements and fine finger movements are intact. There is no dysmetria on finger-to-nose and heel-knee-shin.    GAIT/STANCE: He is able to get up from seated position arm crossed   DIAGNOSTIC DATA (LABS, IMAGING, TESTING) - I reviewed patient records, labs, notes, testing and imaging myself where available.   ASSESSMENT AND PLAN  Bob Warren is a 72 y.o. male   Seropositive generalized myasthenia gravis,  He was noted to have mild bulbar, proximal limb muscle weakness,  Positive acetylcholine binding antibody  Complains of frequent diarrhea, stop Mestinon 60 mg 3 times a day  Not a good candidate for long-term prednisone treatment due to diabetes,A1C 7.7  Insurance will not cover CellCept 500 mg twice a day, he is taking Imuran 50 mg 2 tablets twice  a day since April 07 2017, developed mild abnormal liver functional test, Imuran was stopped since December 2018, he previously had abnormal liver functional test, negative hepatitis panel, repeat laboratory evaluations in January 2019, a month after stopping Imuran, liver functional test was normalized  We will try to restart Imuran at low dose 50 mg twice a  day titrating to 100 mg twice a day  Continue IVIG treatment  (First IVIG treatment was on November 12, 13, 14, 2018 for total of 2 g/kg, second infusion was on July 14, 2017 2018, 1 g/kg; February 12, 13 2019, 1 g/kg; preauthorization is good until May 20, 2018 through intra-fusion)  Marcial Pacas, M.D. Ph.D.  [facility] [address] [place], [place] [address] Ph: [phone] Fax: [phone]  CC: Referring Provider

## 2017-10-02 NOTE — Telephone Encounter (Signed)
Received a call from [facility] advising me that a PA will need to be completed for pt's IVIG/Octagam.  I asked [employer] to fax [place] the PA request for for octagam. I verified with her that the order for IVIG is 1g/kg divided over 2 days every month. Will complete PA when fax is received.

## 2018-01-07 ENCOUNTER — Telehealth: Payer: Self-pay | Admitting: Neurology

## 2018-01-07 NOTE — Telephone Encounter (Signed)
Pts wife called stating that the pt is having trouble swallowing, [name] feels this is relates to then infusions the pt recently received. Cindy requesting a call to see if pt can be seen today instead of scheduled appt tomorrow. Advised that if any problems worsen or pt has trouble breathing to go to the ED.

## 2018-01-07 NOTE — Telephone Encounter (Signed)
Late entry:  I spoke to the patient within ten minutes of his initial phone call.  He had IVIG in our office yesterday.  He has noticed mild swallowing difficulty today and breathing difficulty but only with physical exertion.  He is not having labored breathing when sitting and relaxing.  He does not feel he is in distress.  Dr. Krista Blue is aware and would like him to keep his appointment with her tomorrow morning.  The patient was instructed to proceed to the ED if his symptoms progress to the point that he feels his safety is compromised by staying home.  He verbalized understanding.

## 2018-01-08 ENCOUNTER — Encounter: Payer: Self-pay | Admitting: Neurology

## 2018-01-08 ENCOUNTER — Ambulatory Visit: Payer: Medicare Other | Admitting: Neurology

## 2018-01-08 VITALS — BP 149/74 | HR 96 | Ht 71.0 in | Wt 204.5 lb

## 2018-01-08 DIAGNOSIS — R531 Weakness: Secondary | ICD-10-CM

## 2018-01-08 DIAGNOSIS — G7000 Myasthenia gravis without (acute) exacerbation: Secondary | ICD-10-CM | POA: Diagnosis not present

## 2018-01-08 NOTE — Progress Notes (Signed)
PATIENT: Bob Warren DOB: 1945/08/11  Chief Complaint  Patient presents with  . Myasthenia Gravis    He is here with his wife, [name].  His last IVIG was in our office on 01/06/18. Reports mild swallowing troubles and breathing difficulty but only with physical exertion.  He is not having labored breathing when sitting and relaxing.  He does not feel well today.  His blood sugar was 56 this morning prior to eating breakfast.     HISTORICAL  Bob Warren is a 72 years old right-handed male, seen in refer by  my colleague Dr. Jaynee Eagles to continue follow-up for his myasthenia gravis.  He was seen by Dr. Jaynee Eagles on February 21 2017 as a referral from [name], MD for left upper eyelid droop.  He has past medical history of type 2 diabetes, hypertension, hyperlipidemia.   He noted left eyelid droopy around February 08 2017, intermittent, worse at nighttime, fatigue, at its worst, left upper eyelid would cover half of the left eye, with mild blocking of his left vision. he denies double vision, no swallowing difficulty, no limb muscle weakness noted.  Since initial visit, laboratory evaluations in July 2018 showed positive acetylcholine modulating antibody, binding antibody, normal TSH, CPK, CBC, CMP with exception of mild elevated glucose 147,   He was also getting a prescription of Mestinon 60 mg 3 times a day, he tolerated the medication well, with Mestinon treatment, he barely notice any significant droopy eyelid. Personally reviewed MRI of the brain August 2018, moderate atrophy, supratentorium small vessel disease. 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  Repetitive nerve stimulation March 06 2017 of right abductor digital minimum show no significant abnormality  On today's examination, he was noted to have mild double vision, bulbar, proximal upper and lower extremity weakness,   UPDATE May 07 2017:  He was admitted to the hospital in September for 2018, complains of generalized weakness, gait abnormality, was diagnosed with positive rhinovirus by respiratory PCR, received IVIG treatment, showed moderate improvement,  I reviewed the laboratory evaluation in 2018, negative RPR, glucose was elevated 160, fasting lipid profile showed LDL of 87, cholesterol 151, A1c was 7.4, normal TSH,  Laboratory evaluation in October 2018 from his primary care doctor, normal WBC 5, hemoglobin was 12.5, CMP showed elevated glucose of 198, creatinine of 0.8, AST was mildly elevated 89, alkaline phosphate was 128, TSH was mildly elevated 3.4, urine analysis was negative  Chest x-ray in April 01 2017 showed no significant abnormality,  Since hospital admission on April 05 2017, he continued to feel weak, nauseous, difficulty walking, frequent urination about once hour, also complains of elevated glucose 160s  UPDATE Dec 6th 2018: CT chest on November 01/15/2017 showed no thymus abnormality, area of scarring and atelectasis in the inferior lingular, and left lower lobe He feel weaker, all over, no double vision, no droopy eye lid, word finding difficulty, no dysphagia, no SOB,   He is taking Mestinon 60 mg 3 times a day, complains of frequent diarrhea, feeling fatigued,  He received IVIG 2 g/kg on November 12, 13, 14, with mild improvement, planning on to receive it again on December 11, and 12, he hopes to transfer his IVIG treatment locally.  UPDATE October 02 2017: He was admitted to [facility] on August 04, 2017, at his worst, Bob Warren stated he could not move, mild dysphagia, per hospital admission, motor strength  was 4 out of 5 throughout, he was treated with IVIG, which did help his symptoms,  Last ivig on Feb 12, 13th 2019, will have it monthly, he is no longer taking Mestinon due to GI side effect, Imuran was stopped in December 2018 due to abnormal liver functional test,  Repeat  laboratory evaluation on August 05, 2017 showed normalization of AST, ALT, alkaline phosphate, continue mild elevated bilirubin,  His diabetes is under suboptimal control,A1C 7.7  He responded very well to IVIG treatment.  UPDATE January 08 2018: He is accompanied by his wife at today's clinical visit, complaining of not feeling well, generalized weakness, chest area burning sensation, he does have acid reflux, has taking a lot of time, was seen by GI yesterday, was given a different prescription, he also noticed mild swallowing difficulties, denies breathing difficulties,  This morning his glucose level was only 56, only eat cornflakes cereal his dinner and breakfast, he was insulin shots, last IVIG was on January 06, 2018, tolerated infusion well, will change to home infusion later  He denies double vision, no limb muscle weakness  REVIEW OF SYSTEMS: Full 14 system review of systems performed and notable only for: Activity change, appetite change, trouble swallowing, eye itching, light sensitivity, shortness of breath, constipation, vomiting, frequent awakening, bladder incontinence, frequent urination, back pain  ALLERGIES: Allergies  Allergen Reactions  . Lisinopril     Other reaction(s): Other (See Comments) Cough    HOME MEDICATIONS: Current Outpatient Medications  Medication Sig Dispense Refill  . amLODipine (NORVASC) 10 MG tablet Take 10 mg by mouth.    Marland Kitchen aspirin 81 MG chewable tablet Chew 81 mg by mouth.    Marland Kitchen atorvastatin (LIPITOR) 20 MG tablet Take 20 mg by mouth.    . azaTHIOprine (IMURAN) 50 MG tablet Take 2 tablets (100 mg total) by mouth 2 (two) times daily. One tab po twice a day for one week, then 2 tabs twice a day 120 tablet 11  . azaTHIOprine (IMURAN) 50 MG tablet One tab twice a day for 2 weeks, then 2 tabs twice a day 120 tablet 11  . Cholecalciferol (VITAMIN D3) 1000 units CAPS Take by mouth.    . hydrochlorothiazide (HYDRODIURIL) 25 MG tablet Take 25 mg by mouth  daily.    . Insulin Glargine (LANTUS [place]) Inject 46 Units into the skin daily.     Marland Kitchen losartan (COZAAR) 25 MG tablet Take 25 mg by mouth 2 (two) times daily.     . metFORMIN (GLUCOPHAGE) 850 MG tablet Take 850 mg by mouth.    . Omega-3 Fatty Acids (FISH OIL) 1000 MG CAPS Take 2 capsules by mouth daily.    . ondansetron (ZOFRAN ODT) 4 MG disintegrating tablet Take 1 tablet (4 mg total) by mouth every 8 (eight) hours as needed. 30 tablet 6  . oxybutynin (DITROPAN-XL) 5 MG 24 hr tablet Take 1 tablet (5 mg total) by mouth at bedtime. 30 tablet 11  . polyethylene glycol (MIRALAX / GLYCOLAX) packet Take 17 g by mouth daily.    . ranitidine (ZANTAC) 150 MG tablet Take 150 mg by mouth 2 (two) times daily.     No current facility-administered medications for this visit.     PAST MEDICAL HISTORY: Past Medical History:  Diagnosis Date  . Diabetes ([place])   . High cholesterol   . Hypertension   . Myasthenia gravis ([place])     PAST SURGICAL HISTORY: Past Surgical History:  Procedure Laterality Date  . SKIN CANCER EXCISION  Right 2016   Arm    FAMILY HISTORY: Family History  Problem Relation Age of Onset  . Cancer Mother   . Other Father        Gunshot wound  . Diabetes Maternal Grandfather     SOCIAL HISTORY:  Social History   Socioeconomic History  . Marital status: Married    Spouse name: Not on file  . Number of children: 2  . Years of education: 38  . Highest education level: Not on file  Occupational History  . Occupation: Retired  Scientific laboratory technician  . Financial resource strain: Not on file  . Food insecurity:    Worry: Not on file    Inability: Not on file  . Transportation needs:    Medical: Not on file    Non-medical: Not on file  Tobacco Use  . Smoking status: Former Research scientist (life sciences)  . Smokeless tobacco: Never Used  . Tobacco comment: Quit 25 yrs ago  Substance and Sexual Activity  . Alcohol use: No    Comment: Quit 25 yrs ago  . Drug use: No  . Sexual activity: Not on file    Lifestyle  . Physical activity:    Days per week: Not on file    Minutes per session: Not on file  . Stress: Not on file  Relationships  . Social connections:    Talks on phone: Not on file    Gets together: Not on file    Attends religious service: Not on file    Active member of club or organization: Not on file    Attends meetings of clubs or organizations: Not on file    Relationship status: Not on file  . Intimate partner violence:    Fear of current or ex partner: Not on file    Emotionally abused: Not on file    Physically abused: Not on file    Forced sexual activity: Not on file  Other Topics Concern  . Not on file  Social History Narrative   Lives at home w/ his wife   Right-handed   Caffeine: 3-6 cups of coffee per day     PHYSICAL EXAM   Vitals:   01/08/18 1031  BP: (!) 149/74  Pulse: 96  Weight: 204 lb 8 oz (92.8 kg)  Height: 5\' 11"  (1.803 m)    Not recorded      Body mass index is 28.52 kg/m.  PHYSICAL EXAMNIATION:  Gen: NAD, conversant, well nourised, obese, well groomed                     Cardiovascular: Regular rate rhythm, no peripheral edema, warm, nontender. Eyes: Conjunctivae clear without exudates or hemorrhage Neck: Supple, no carotid bruits. Pulmonary: Clear to auscultation bilaterally   NEUROLOGICAL EXAM:  MENTAL STATUS: Speech:    Speech is normal; fluent and spontaneous with normal comprehension.  Cognition:     Orientation to time, place and person     Normal recent and remote memory     Normal Attention span and concentration     Normal Language, naming, repeating,spontaneous speech     Fund of knowledge   CRANIAL NERVES: CN II: Visual fields are full to confrontation.   Pupils are round equal and briskly reactive to light. CN III, IV, VI: extraocular movement are normal. Cover and uncover testing showed mild bilateral exophoria,  CN V: Facial sensation is intact to pinprick in all 3 divisions bilaterally. Corneal  responses are intact.  CN VII:  Face is symmetric, he has NO bilateral eye closure cheek puff muscle weakness.  CN VIII: Hearing is normal to rubbing fingers CN IX, X: Palate elevates symmetrically. Phonation is normal. CN XI: Head turning and shoulder shrug are intact CN XII: Tongue is midline with normal movements and no atrophy.  MOTOR: There was no motor weakness noted, in specific, no evidence of neck flexion or limb muscle weakness  REFLEXES: Reflexes are 2+ and symmetric at the biceps, triceps, knees, and ankles. Plantar responses are flexor.  SENSORY: Intact to light touch, pinprick, positional sensation and vibratory sensation are intact in fingers and toes.  COORDINATION: Rapid alternating movements and fine finger movements are intact. There is no dysmetria on finger-to-nose and heel-knee-shin.    GAIT/STANCE: He is able to get up from seated position arm crossed   DIAGNOSTIC DATA (LABS, IMAGING, TESTING) - I reviewed patient records, labs, notes, testing and imaging myself where available.   ASSESSMENT AND PLAN  Bob Warren is a 72 y.o. male   Seropositive generalized myasthenia gravis,  There was no significant bulbar or limb muscle weakness noted  Positive acetylcholine binding antibody in the past  Complains of frequent diarrhea, stop Mestinon 60 mg 3 times a day  Not a good candidate for long-term prednisone treatment due to diabetes,A1C 7.7, insulin-dependent diabetes,  Insurance will not cover CellCept 500 mg twice a day, he was taking Imuran 50 mg 2 tablets twice a day since April 07 2017, developed mild abnormal liver functional test, Imuran was stopped since December 2018, he previously had abnormal liver functional test, negative hepatitis panel, repeat laboratory evaluations in January 2019, a month after stopping Imuran, liver functional test was normalized  Continue IVIG treatment  (First IVIG treatment was on November 12, 13, 14, 2018 for total of 2  g/kg, second infusion was on July 14, 2017 2018, 1 g/kg; February 12, 13 2019, 1 g/kg; preauthorization is good until May 20, 2018 through intra-fusion, last infusion was on January 06, 2018, he will continue IVIG infusion for home health specialty pharmacy)  Today his complains of generalized fatigue, subjective swallowing difficulty is most consistent with his acid reflux, no evidence of flareup of his myasthenia gravis  Marcial Pacas, M.D. Ph.D.  [facility] [address] [place], [place] [address] Ph: [phone] Fax: [phone]  CC: Referring Provider

## 2018-01-12 ENCOUNTER — Inpatient Hospital Stay
Admission: AD | Admit: 2018-01-12 | Payer: Self-pay | Source: Other Acute Inpatient Hospital | Admitting: Family Medicine

## 2018-01-13 ENCOUNTER — Other Ambulatory Visit: Payer: Self-pay

## 2018-01-13 ENCOUNTER — Inpatient Hospital Stay
Admission: EM | Admit: 2018-01-13 | Discharge: 2018-01-15 | DRG: 391 | Disposition: A | Discharge status: Home / Self Care | Payer: Medicare Other | Source: Other Acute Inpatient Hospital | Attending: Internal Medicine | Admitting: Internal Medicine

## 2018-01-13 DIAGNOSIS — I10 Essential (primary) hypertension: Secondary | ICD-10-CM | POA: Diagnosis present

## 2018-01-13 DIAGNOSIS — K7581 Nonalcoholic steatohepatitis (NASH): Secondary | ICD-10-CM | POA: Diagnosis present

## 2018-01-13 DIAGNOSIS — R040 Epistaxis: Secondary | ICD-10-CM | POA: Diagnosis not present

## 2018-01-13 DIAGNOSIS — Z87891 Personal history of nicotine dependence: Secondary | ICD-10-CM | POA: Diagnosis not present

## 2018-01-13 DIAGNOSIS — G7000 Myasthenia gravis without (acute) exacerbation: Secondary | ICD-10-CM | POA: Diagnosis present

## 2018-01-13 DIAGNOSIS — K920 Hematemesis: Secondary | ICD-10-CM | POA: Diagnosis present

## 2018-01-13 DIAGNOSIS — E119 Type 2 diabetes mellitus without complications: Secondary | ICD-10-CM | POA: Diagnosis present

## 2018-01-13 DIAGNOSIS — T451X5A Adverse effect of antineoplastic and immunosuppressive drugs, initial encounter: Secondary | ICD-10-CM | POA: Diagnosis present

## 2018-01-13 DIAGNOSIS — Z809 Family history of malignant neoplasm, unspecified: Secondary | ICD-10-CM | POA: Diagnosis not present

## 2018-01-13 DIAGNOSIS — E7800 Pure hypercholesterolemia, unspecified: Secondary | ICD-10-CM | POA: Diagnosis present

## 2018-01-13 DIAGNOSIS — K922 Gastrointestinal hemorrhage, unspecified: Secondary | ICD-10-CM | POA: Diagnosis not present

## 2018-01-13 DIAGNOSIS — D61818 Other pancytopenia: Secondary | ICD-10-CM

## 2018-01-13 DIAGNOSIS — Z79899 Other long term (current) drug therapy: Secondary | ICD-10-CM

## 2018-01-13 DIAGNOSIS — D62 Acute posthemorrhagic anemia: Secondary | ICD-10-CM | POA: Diagnosis present

## 2018-01-13 DIAGNOSIS — Z7982 Long term (current) use of aspirin: Secondary | ICD-10-CM

## 2018-01-13 DIAGNOSIS — R188 Other ascites: Secondary | ICD-10-CM | POA: Diagnosis not present

## 2018-01-13 DIAGNOSIS — D61811 Other drug-induced pancytopenia: Secondary | ICD-10-CM | POA: Diagnosis present

## 2018-01-13 DIAGNOSIS — K7460 Unspecified cirrhosis of liver: Secondary | ICD-10-CM | POA: Diagnosis not present

## 2018-01-13 DIAGNOSIS — K3189 Other diseases of stomach and duodenum: Secondary | ICD-10-CM | POA: Diagnosis present

## 2018-01-13 DIAGNOSIS — K210 Gastro-esophageal reflux disease with esophagitis: Secondary | ICD-10-CM | POA: Diagnosis present

## 2018-01-13 DIAGNOSIS — K766 Portal hypertension: Secondary | ICD-10-CM | POA: Diagnosis present

## 2018-01-13 DIAGNOSIS — R740 Nonspecific elevation of levels of transaminase and lactic acid dehydrogenase [LDH]: Secondary | ICD-10-CM

## 2018-01-13 DIAGNOSIS — Z888 Allergy status to other drugs, medicaments and biological substances status: Secondary | ICD-10-CM | POA: Diagnosis not present

## 2018-01-13 DIAGNOSIS — Z794 Long term (current) use of insulin: Secondary | ICD-10-CM

## 2018-01-13 DIAGNOSIS — Z833 Family history of diabetes mellitus: Secondary | ICD-10-CM | POA: Diagnosis not present

## 2018-01-13 DIAGNOSIS — Z66 Do not resuscitate: Secondary | ICD-10-CM | POA: Diagnosis present

## 2018-01-13 DIAGNOSIS — Z85828 Personal history of other malignant neoplasm of skin: Secondary | ICD-10-CM

## 2018-01-13 DIAGNOSIS — I820 Budd-Chiari syndrome: Secondary | ICD-10-CM

## 2018-01-13 DIAGNOSIS — R748 Abnormal levels of other serum enzymes: Secondary | ICD-10-CM | POA: Diagnosis not present

## 2018-01-13 DIAGNOSIS — K921 Melena: Secondary | ICD-10-CM

## 2018-01-13 LAB — COMPREHENSIVE METABOLIC PANEL
ALK PHOS: 72 U/L (ref 38–126)
ALT: 196 U/L — AB (ref 17–63)
AST: 298 U/L — ABNORMAL HIGH (ref 15–41)
Albumin: 2 g/dL — ABNORMAL LOW (ref 3.5–5.0)
Anion gap: 19 — ABNORMAL HIGH (ref 5–15)
BUN: 57 mg/dL — ABNORMAL HIGH (ref 6–20)
CALCIUM: 7.6 mg/dL — AB (ref 8.9–10.3)
CO2: 16 mmol/L — ABNORMAL LOW (ref 22–32)
CREATININE: 2.09 mg/dL — AB (ref 0.61–1.24)
Chloride: 102 mmol/L (ref 101–111)
GFR, EST AFRICAN AMERICAN: 35 mL/min — AB (ref >60)
GFR, EST NON AFRICAN AMERICAN: 30 mL/min — AB (ref >60)
Glucose, Bld: 123 mg/dL — ABNORMAL HIGH (ref 65–99)
Potassium: 4.6 mmol/L (ref 3.5–5.1)
Sodium: 137 mmol/L (ref 135–145)
Total Bilirubin: 7.5 mg/dL — ABNORMAL HIGH (ref 0.3–1.2)
Total Protein: 6 g/dL — ABNORMAL LOW (ref 6.5–8.1)

## 2018-01-13 LAB — GLUCOSE, CAPILLARY
GLUCOSE-CAPILLARY: 274 mg/dL — AB (ref 65–99)
GLUCOSE-CAPILLARY: 251 mg/dL — AB (ref 65–99)
Glucose-Capillary: 104 mg/dL — ABNORMAL HIGH (ref 65–99)
Glucose-Capillary: 114 mg/dL — ABNORMAL HIGH (ref 65–99)
Glucose-Capillary: 177 mg/dL — ABNORMAL HIGH (ref 65–99)

## 2018-01-13 LAB — CBC
HEMATOCRIT: 26.3 % — AB (ref 40.0–52.0)
HEMOGLOBIN: 9 g/dL — AB (ref 13.0–18.0)
MCH: 38.2 pg — ABNORMAL HIGH (ref 26.0–34.0)
MCHC: 34.2 g/dL (ref 32.0–36.0)
MCV: 111.7 fL — AB (ref 80.0–100.0)
Platelets: 58 10*3/uL — ABNORMAL LOW (ref 150–440)
RBC: 2.35 MIL/uL — AB (ref 4.40–5.90)
RDW: 26.9 % — ABNORMAL HIGH (ref 11.5–14.5)
WBC: 3 10*3/uL — ABNORMAL LOW (ref 3.8–10.6)

## 2018-01-13 LAB — FOLATE: Folate: 22 ng/mL (ref >5.9)

## 2018-01-13 LAB — TECHNOLOGIST SMEAR REVIEW

## 2018-01-13 LAB — FERRITIN: Ferritin: 220 ng/mL (ref 24–336)

## 2018-01-13 LAB — PROTIME-INR
INR: 1.53
Prothrombin Time: 18.3 s — ABNORMAL HIGH (ref 11.4–15.2)

## 2018-01-13 LAB — TSH: TSH: 2.607 u[IU]/mL (ref 0.350–4.500)

## 2018-01-13 LAB — LACTATE DEHYDROGENASE: LDH: 289 U/L — ABNORMAL HIGH (ref 98–192)

## 2018-01-13 MED ORDER — PANTOPRAZOLE SODIUM 40 MG IV SOLR
40.0000 mg | Freq: Two times a day (BID) | INTRAVENOUS | Status: DC
  Administered 2018-01-13 – 2018-01-15 (×5): 40 mg via INTRAVENOUS
  Filled 2018-01-13 (×5): qty 40

## 2018-01-13 MED ORDER — SODIUM CHLORIDE 0.9 % IV SOLN
INTRAVENOUS | Status: DC
  Administered 2018-01-14: 14:00:00 via INTRAVENOUS

## 2018-01-13 MED ORDER — HYDROCHLOROTHIAZIDE 25 MG PO TABS: 25.0000 mg | ORAL_TABLET | Freq: Every day | ORAL | Status: DC

## 2018-01-13 MED ORDER — ONDANSETRON HCL 4 MG/2ML IJ SOLN: 4.0000 mg | Freq: Four times a day (QID) | INTRAMUSCULAR | Status: DC | PRN

## 2018-01-13 MED ORDER — GENERIC EXTERNAL MEDICATION
8.00 | Status: DC
Start: ? — End: 2018-01-13

## 2018-01-13 MED ORDER — FAMOTIDINE 20 MG PO TABS: 20.0000 mg | ORAL_TABLET | Freq: Two times a day (BID) | ORAL | Status: DC

## 2018-01-13 MED ORDER — LOSARTAN POTASSIUM 25 MG PO TABS
25.0000 mg | ORAL_TABLET | Freq: Two times a day (BID) | ORAL | Status: DC
  Administered 2018-01-13 – 2018-01-14 (×2): 25 mg via ORAL
  Filled 2018-01-13 (×2): qty 1

## 2018-01-13 MED ORDER — ACETAMINOPHEN 650 MG RE SUPP: 650.0000 mg | Freq: Four times a day (QID) | RECTAL | Status: DC | PRN

## 2018-01-13 MED ORDER — AZATHIOPRINE 50 MG PO TABS
100.0000 mg | ORAL_TABLET | Freq: Two times a day (BID) | ORAL | Status: DC
  Administered 2018-01-13: 100 mg via ORAL
  Filled 2018-01-13 (×2): qty 2

## 2018-01-13 MED ORDER — INSULIN GLARGINE 100 UNIT/ML ~~LOC~~ SOLN
15.0000 [IU] | Freq: Every day | SUBCUTANEOUS | Status: DC
  Administered 2018-01-13: 15 [IU] via SUBCUTANEOUS
  Filled 2018-01-13 (×2): qty 0.15

## 2018-01-13 MED ORDER — INSULIN ASPART 100 UNIT/ML ~~LOC~~ SOLN
0.0000 [IU] | SUBCUTANEOUS | Status: DC
  Administered 2018-01-13: 5 [IU] via SUBCUTANEOUS
  Administered 2018-01-13 – 2018-01-14 (×2): 2 [IU] via SUBCUTANEOUS
  Administered 2018-01-14: 3 [IU] via SUBCUTANEOUS
  Administered 2018-01-14: 2 [IU] via SUBCUTANEOUS
  Administered 2018-01-14: 3 [IU] via SUBCUTANEOUS
  Administered 2018-01-14: 2 [IU] via SUBCUTANEOUS
  Administered 2018-01-15: 3 [IU] via SUBCUTANEOUS
  Administered 2018-01-15: 1 [IU] via SUBCUTANEOUS
  Administered 2018-01-15: 2 [IU] via SUBCUTANEOUS
  Filled 2018-01-13 (×10): qty 1

## 2018-01-13 MED ORDER — OMEGA-3-ACID ETHYL ESTERS 1 G PO CAPS
1.0000 g | ORAL_CAPSULE | Freq: Two times a day (BID) | ORAL | Status: DC
  Administered 2018-01-13 – 2018-01-15 (×4): 1 g via ORAL
  Filled 2018-01-13 (×4): qty 1

## 2018-01-13 MED ORDER — ATORVASTATIN CALCIUM 20 MG PO TABS: 20.0000 mg | ORAL_TABLET | Freq: Every day | ORAL | Status: DC

## 2018-01-13 MED ORDER — AMLODIPINE BESYLATE 10 MG PO TABS
10.0000 mg | ORAL_TABLET | Freq: Every day | ORAL | Status: DC
  Administered 2018-01-15: 10 mg via ORAL
  Filled 2018-01-13: qty 1

## 2018-01-13 MED ORDER — ONDANSETRON HCL 4 MG PO TABS: 4.0000 mg | ORAL_TABLET | Freq: Four times a day (QID) | ORAL | Status: DC | PRN

## 2018-01-13 MED ORDER — SODIUM CHLORIDE 0.9 % IV SOLN
INTRAVENOUS | Status: DC
  Administered 2018-01-13 – 2018-01-14 (×4): via INTRAVENOUS

## 2018-01-13 MED ORDER — POLYETHYLENE GLYCOL 3350 17 G PO PACK
17.0000 g | PACK | Freq: Every day | ORAL | Status: DC
  Administered 2018-01-15: 17 g via ORAL
  Filled 2018-01-13 (×2): qty 1

## 2018-01-13 MED ORDER — ACETAMINOPHEN 325 MG PO TABS: 650.0000 mg | ORAL_TABLET | Freq: Four times a day (QID) | ORAL | Status: DC | PRN

## 2018-01-13 MED ORDER — OXYBUTYNIN CHLORIDE ER 5 MG PO TB24
5.0000 mg | ORAL_TABLET | Freq: Every day | ORAL | Status: DC
  Administered 2018-01-13 – 2018-01-14 (×2): 5 mg via ORAL
  Filled 2018-01-13 (×3): qty 1

## 2018-01-13 NOTE — Consult Note (Signed)
Hematology/Oncology Consult note [facility] Telephone:([phone] [phone]  Patient Care Team: [name], MD as PCP - General (Internal Medicine) [facility], Arletha Pili as Referring Physician (General Practice)   Name of the patient: Bob Warren  [id number]  1946/02/12   Date of visit: 01/13/18 REASON FOR COSULTATION:  Pancytopenia  history of presenting illness-  72 y.o. male with PMH listed at below who presents to ER for evaluation of hematemesis.  Reports vomiting dark blood.  Recently also had a history of epistaxis as well. Admit NSAIDs use recently. He initially presented to [facility] emergency room and later transferred to [facility]. Reviewed his medical history and the lab work-up in care everywhere He has a history of myasthenia gravis for which he takes Imuran  twice daily and IVIG monthly, follows up with Dr. Krista Blue at [facility]   neurologic associate.  Per neurology's note, Imuran was stopped in December 2018 and restarted in March 2019,  IVIG monthly. Patient reports he has been taking an oral medication for his MG  Patient reports fatigue, lack of energy, heartburn started on ranitidine..  Denies any night sweats, weight loss.  Review of Systems  Constitutional: Positive for malaise/fatigue. Negative for chills, fever and weight loss.  HENT: Positive for nosebleeds. Negative for congestion, ear discharge, ear pain, sinus pain and sore throat.   Eyes: Negative for double vision, photophobia, pain, discharge and redness.  Respiratory: Negative for cough, hemoptysis, sputum production, shortness of breath and wheezing.   Cardiovascular: Negative for chest pain, palpitations, orthopnea, claudication and leg swelling.  Gastrointestinal: Positive for heartburn. Negative for abdominal pain, blood in stool, constipation, diarrhea, melena, nausea and vomiting.  Genitourinary: Negative for dysuria, flank pain, frequency and hematuria.    Musculoskeletal: Negative for back pain, myalgias and neck pain.  Skin: Negative for itching and rash.  Neurological: Negative for dizziness, tingling, tremors, focal weakness, weakness and headaches.  Endo/Heme/Allergies: Negative for environmental allergies. Does not bruise/bleed easily.  Psychiatric/Behavioral: Negative for depression and hallucinations. The patient is not nervous/anxious.     Allergies  Allergen Reactions  . Lisinopril     Other reaction(s): Other (See Comments) Cough    Patient Active Problem List   Diagnosis Date Noted  . GIB (gastrointestinal bleeding) 01/13/2018  . Weakness 05/07/2017  . Myasthenia gravis with acute exacerbation ([place]) 05/07/2017  . Muscle weakness (generalized) 05/07/2017  . HLD (hyperlipidemia) 04/01/2017  . URI (upper respiratory infection) 04/01/2017  . Chronic back pain 04/01/2017  . Hypertension   . Myasthenia gravis ([place]) 03/27/2017  . DM (diabetes mellitus) ([place]) 03/27/2017     Past Medical History:  Diagnosis Date  . Diabetes ([place])   . High cholesterol   . Hypertension   . Myasthenia gravis [facility])      Past Surgical History:  Procedure Laterality Date  . SKIN CANCER EXCISION Right 2016   Arm    Social History   Socioeconomic History  . Marital status: Married    Spouse name: Not on file  . Number of children: 2  . Years of education: 75  . Highest education level: Not on file  Occupational History  . Occupation: Retired  Scientific laboratory technician  . Financial resource strain: Not on file  . Food insecurity:    Worry: Not on file    Inability: Not on file  . Transportation needs:    Medical: Not on file    Non-medical: Not on file  Tobacco Use  . Smoking status: Former Research scientist (life sciences)  .  Smokeless tobacco: Never Used  . Tobacco comment: Quit 25 yrs ago  Substance and Sexual Activity  . Alcohol use: No    Comment: Quit 25 yrs ago  . Drug use: No  . Sexual activity: Not on file  Lifestyle  . Physical activity:    Days  per week: Not on file    Minutes per session: Not on file  . Stress: Not on file  Relationships  . Social connections:    Talks on phone: Not on file    Gets together: Not on file    Attends religious service: Not on file    Active member of club or organization: Not on file    Attends meetings of clubs or organizations: Not on file    Relationship status: Not on file  . Intimate partner violence:    Fear of current or ex partner: Not on file    Emotionally abused: Not on file    Physically abused: Not on file    Forced sexual activity: Not on file  Other Topics Concern  . Not on file  Social History Narrative   Lives at home w/ his wife   Right-handed   Caffeine: 3-6 cups of coffee per day     Family History  Problem Relation Age of Onset  . Cancer Mother   . Other Father        Gunshot wound  . Diabetes Maternal Grandfather      Current Facility-Administered Medications:  .  0.9 %  sodium chloride infusion, , Intravenous, Continuous, Harrie Foreman, MD, Last Rate: 125 mL/hr at 01/13/18 2246 .  0.9 %  sodium chloride infusion, , Intravenous, Continuous, Tahiliani, Varnita B, MD .  amLODipine (NORVASC) tablet 10 mg, 10 mg, Oral, Daily, Harrie Foreman, MD .  hydrochlorothiazide (HYDRODIURIL) tablet 25 mg, 25 mg, Oral, Daily, Harrie Foreman, MD .  insulin aspart (novoLOG) injection 0-9 Units, 0-9 Units, Subcutaneous, Q4H, Harrie Foreman, MD, 5 Units at 01/13/18 2034 .  insulin glargine (LANTUS) injection 15 Units, 15 Units, Subcutaneous, QHS, Harrie Foreman, MD, 15 Units at 01/13/18 2245 .  losartan (COZAAR) tablet 25 mg, 25 mg, Oral, BID, Harrie Foreman, MD, 25 mg at 01/13/18 2245 .  omega-3 acid ethyl esters (LOVAZA) capsule 1 g, 1 g, Oral, BID, Harrie Foreman, MD, 1 g at 01/13/18 2245 .  ondansetron (ZOFRAN) tablet 4 mg, 4 mg, Oral, Q6H PRN **OR** ondansetron (ZOFRAN) injection 4 mg, 4 mg, Intravenous, Q6H PRN, Harrie Foreman, MD .   oxybutynin (DITROPAN-XL) 24 hr tablet 5 mg, 5 mg, Oral, QHS, Harrie Foreman, MD, 5 mg at 01/13/18 2245 .  pantoprazole (PROTONIX) injection 40 mg, 40 mg, Intravenous, Q12H, Tahiliani, Varnita B, MD, 40 mg at 01/13/18 2244 .  polyethylene glycol (MIRALAX / GLYCOLAX) packet 17 g, 17 g, Oral, Daily, Harrie Foreman, MD   Physical exam:  Vitals:   01/13/18 0755 01/13/18 1627 01/13/18 1628 01/13/18 2000  BP: (!) 125/49 (!) 123/55 (!) 128/51 (!) 133/54  Pulse: 78 75 73 84  Resp:  16  16  Temp:  97.8 F (36.6 C)  (!) 97.3 F (36.3 C)  TempSrc:  Oral  Oral  SpO2: 97% 98% 98% 99%  Weight:      Height:       Physical Exam  Constitutional: He is oriented to person, place, and time and well-developed, well-nourished, and in no distress. No distress.  HENT:  Head: Normocephalic and  atraumatic.  Nose: Nose normal.  Mouth/Throat: Oropharynx is clear and moist. No oropharyngeal exudate.  Eyes: Pupils are equal, round, and reactive to light. EOM are normal. Left eye exhibits no discharge. No scleral icterus.  Pale conjunctivae  Neck: Normal range of motion. Neck supple. No JVD present.  Cardiovascular: Normal rate, regular rhythm and normal heart sounds.  No murmur heard. Pulmonary/Chest: Effort normal and breath sounds normal. No respiratory distress. He has no wheezes. He has no rales.  Abdominal: Soft. He exhibits no distension and no mass. There is no tenderness. There is no rebound.  Musculoskeletal: Normal range of motion. He exhibits no edema or tenderness.  Lymphadenopathy:    He has no cervical adenopathy.  Neurological: He is alert and oriented to person, place, and time. No cranial nerve deficit. He exhibits normal muscle tone. Coordination normal.  Skin: Skin is warm and dry. No rash noted. He is not diaphoretic. No erythema.  Psychiatric: Affect and judgment normal.        CMP Latest Ref Rng & Units 01/13/2018  Glucose 65 - 99 mg/dL 123(H)  BUN 6 - 20 mg/dL 57(H)    Creatinine 0.61 - 1.24 mg/dL 2.09(H)  Sodium 135 - 145 mmol/L 137  Potassium 3.5 - 5.1 mmol/L 4.6  Chloride 101 - 111 mmol/L 102  CO2 22 - 32 mmol/L 16(L)  Calcium 8.9 - 10.3 mg/dL 7.6(L)  Total Protein 6.5 - 8.1 g/dL 6.0(L)  Total Bilirubin 0.3 - 1.2 mg/dL 7.5(H)  Alkaline Phos 38 - 126 U/L 72  AST 15 - 41 U/L 298(H)  ALT 17 - 63 U/L 196(H)   CBC Latest Ref Rng & Units 01/13/2018  WBC 3.8 - 10.6 K/uL 3.0(L)  Hemoglobin 13.0 - 18.0 g/dL 9.0(L)  Hematocrit 40.0 - 52.0 % 26.3(L)  Platelets 150 - 440 K/uL 58(L)   No results found.  Assessment and plan- Patient is a 72 y.o. male present with hematemesis and pancytopenia.  #Upper GI bleed, possible combination of peptic ulcer disease from NSAID use/esophagitis/in combination with thrombocytopenia. GI on board.  Patient is boarded for EGD tomorrow.  #Pancytopenia: Etiology unknown. Agree with transfuse PRBC to keep hemoglobin above 7, transfuse platelets if active bleeding or platelets less than 10,000. Reviewed patient's recent 6 months CBC in care everywhere, appears that he started to have pancytopenia since the end of 2008, gradually getting worse.  I am wondering if this is related to his myasthenia gravis medication Imuran. Patient reports that he was recently started back on Imuran.  Neurology note says that Imuran has been stopped since end of 2018.  Need to clarify with patient if he is really taking.  #I ordered differential with smear review. Discussed with hematology lab technician, there is no immature blast or schistocytes on the smear.  #Elevated liver enzymes I suspect this is also secondary to Imuran.  Patient actually developed hepatic toxicity earlier this year which improved after Imuran was discontinued.  Recently Imuran was restarted.  Agree with checking right upper quadrant ultrasound for further evaluation.    Thank you for allowing me to participate in the care of this patient.  Total face to face encounter  time for this patient visit was 70 min. >50% of the time was  spent in counseling and coordination of care.    Earlie Server, MD, PhD Hematology Oncology [facility] Pager- [phone] 01/13/2018

## 2018-01-13 NOTE — Progress Notes (Addendum)
Patient seen today Will repeat labs here Keep patient NPO Has pancytopenia Will get hematology consult Appreciate GI consult Has elevated bilirubin and LFT EGD and Colonoscopy in AM Avoid hepatotoxic drugs Hold Imuran now Discuss with patients neurologist regarding alternative for Imuran as it might be responsible for abnormal lft and pancytopenia Agree with history and physical exam and treatment plan by Dr Marcille Blanco.

## 2018-01-13 NOTE — Progress Notes (Signed)
Advanced care plan. Purpose of the Encounter: CODE STATUS Parties in Attendance:Patient Patient's Decision Capacity:Good Subjective/Patient's story: Presented for vomiting dark blood Objective/Medical story Admitted for GI bleed and abnormal LFT Goals of care determination:  Advance care directives and goals of care discussed Patient wants everything done which includes cardiac resuscitation, intubation and ventilator if need arises. CODE STATUS: Full code Time spent discussing advanced care planning: 16 minutes

## 2018-01-13 NOTE — Consult Note (Addendum)
Vonda Antigua, MD [address], [place], [place], [place], [address] [address], [place], [place], [place], [address] Phone: [phone]  Fax: [phone]  Consultation  Referring Provider:     Dr. Estanislado Pandy Primary Care Physician:  [name], MD Reason for Consultation:     GI bleed  Date of Admission:  01/13/2018 Date of Consultation:  01/13/2018         HPI:   Bob Warren is a 72 y.o. male who presents with hematemesis.  Patient was at his primary care physician's office yesterday, and had an episode of hematemesis around 2:30 to 3 PM yesterday.  No further episodes since then.  No abdominal pain.  Also reports melanotic stool about an hour ago today.  Also had another melanotic stool x1 yesterday.  Initially he went to the [facility] ER, which was close to his [facility] primary care physician office, and then was transferred to [facility].  H&P was done this morning, and there is no repeat lab work since admission.  Last hemoglobin available is yesterday around 11 PM at [facility], and hemoglobin was 7.5.  Patient states he received 2 unit packed red blood cells at [facility].  Patient reports daily ibuprofen use, takes 2 pills every morning.  Also is on aspirin 81 mg daily.  No other antiplatelets or anticoagulants.  No previous history of GI bleed.  No previous history of EGD. States last colonoscopy was in 2009 in [place], states he was told it was normal and that he does not need another one for 10 years at that time.  Also reports 1 week history of dysphagia and heartburn, for which he was put on ranitidine. Also reports constipation about 2 weeks ago, and was placed on MiraLAX which relieved the constipation.  Past Medical History:  Diagnosis Date  . Diabetes ([place])   . High cholesterol   . Hypertension   . Myasthenia gravis [facility])     Past Surgical History:  Procedure Laterality Date  . SKIN CANCER EXCISION Right 2016   Arm    Prior to Admission medications   Medication  Sig Start Date End Date Taking? Authorizing Provider  amLODipine (NORVASC) 10 MG tablet Take 10 mg by mouth.    [provider]  aspirin 81 MG chewable tablet Chew 81 mg by mouth.    [provider]  atorvastatin (LIPITOR) 20 MG tablet Take 20 mg by mouth.    [provider]  azaTHIOprine (IMURAN) 50 MG tablet Take 2 tablets (100 mg total) by mouth 2 (two) times daily. One tab po twice a day for one week, then 2 tabs twice a day 04/07/17   Marcial Pacas, MD  Cholecalciferol (VITAMIN D3) 1000 units CAPS Take by mouth.    [provider]  hydrochlorothiazide (HYDRODIURIL) 25 MG tablet Take 25 mg by mouth daily. 04/03/17   [provider]  Insulin Glargine (LANTUS [place]) Inject 46 Units into the skin daily.     [provider]  losartan (COZAAR) 25 MG tablet Take 25 mg by mouth 2 (two) times daily.  05/22/15   [provider]  metFORMIN (GLUCOPHAGE) 850 MG tablet Take 850 mg by mouth.    [provider]  Omega-3 Fatty Acids (FISH OIL) 1000 MG CAPS Take 2 capsules by mouth daily.    [provider]  ondansetron (ZOFRAN ODT) 4 MG disintegrating tablet Take 1 tablet (4 mg total) by mouth every 8 (eight) hours as needed. 05/07/17   Krista Blue,  Yijun, MD  oxybutynin (DITROPAN-XL) 5 MG 24 hr tablet Take 1 tablet (5 mg total) by mouth at bedtime. 05/07/17   Marcial Pacas, MD  polyethylene glycol (MIRALAX / GLYCOLAX) packet Take 17 g by mouth daily.    [provider]  ranitidine (ZANTAC) 150 MG tablet Take 150 mg by mouth 2 (two) times daily.    [provider]    Family History  Problem Relation Age of Onset  . Cancer Mother   . Other Father        Gunshot wound  . Diabetes Maternal Grandfather      Social History   Tobacco Use  . Smoking status: Former Research scientist (life sciences)  . Smokeless tobacco: Never Used  . Tobacco comment: Quit 25 yrs ago  Substance Use Topics  . Alcohol use: No    Comment: Quit 25 yrs ago  . Drug use: No      Allergies as of 01/13/2018 - Review Complete 01/13/2018  Allergen Reaction Noted  . Lisinopril  03/15/2013    Review of Systems:    All systems reviewed and negative except where noted in HPI.   Physical Exam:  Vital signs in last 24 hours: Vitals:   01/13/18 0524 01/13/18 0754 01/13/18 0755  BP: (!) 124/24 (!) 123/48 (!) 125/49  Pulse: 79 78 78  Resp: 18 16   Temp: 98.1 F (36.7 C) 97.9 F (36.6 C)   TempSrc: Oral Oral   SpO2: 100% 96% 97%  Weight: 205 lb 4.8 oz (93.1 kg)    Height: '5\' 11"'  (1.803 m)       General:   Pleasant, cooperative in NAD Head:  Normocephalic and atraumatic. Eyes:   No icterus.   Conjunctiva pink. PERRLA. Ears:  Normal auditory acuity. Neck:  Supple; no masses or thyroidomegaly Lungs: Respirations even and unlabored. Lungs clear to auscultation bilaterally.   No wheezes, crackles, or rhonchi.  Abdomen:  Soft, nondistended, nontender. Normal bowel sounds. No appreciable masses or hepatomegaly.  No rebound or guarding.  Neurologic:  Alert and oriented x3;  grossly normal neurologically. Skin:  Intact without significant lesions or rashes. Cervical Nodes:  No significant cervical adenopathy. Psych:  Alert and cooperative. Normal affect.  LAB RESULTS: Labs reviewed in care everywhere and last hemoglobin was 7.5 yesterday around 11 PM  STUDIES: No results found.    Impression / Plan:   Bob Warren is a 72 y.o. y/o male with hematemesis, and NSAID use with 1 week history of dysphagia  -Upper GI bleed Possible upper GI bleed from peptic ulcer disease from NSAID use versus esophagitis Given his dysphagia, esophagitis versus esophageal malignancy is also in the differential I have called the pharmacy, and changed his Pepcid to Protonix 40 mg IV twice daily  Please obtain labs on this admission, as I do not see them since transfer from [facility].   Please obtain CBC, CMP, INR and transfuse as needed Will need medical optimization prior to  EGD Patient reports receiving 2 units of packed red blood cells at [facility], repeat CBC will need to be done to determine if further transfusions are needed here.  Avoid NSAIDs Serial CBCs and transfuse PRN Maintain 2 large-bore IV lines EGD today or tomorrow depending on medical optimization, and clinical status   -Elevated liver enzymes His CMP at [facility] also shows elevated liver enzymes, with total bilirubin of 4.9, AST 270, ALT 207, normal alk phos at 84. Patient denies any abdominal pain He denies any alcohol use.  Stopped drinking  in the 1970s, and was not a heavy drinker prior to that. Denies any previous history of cirrhosis.  His platelets are also low at 56 at [facility].  Elevated liver enzymes are not consistent with biliary obstruction clinically Possibly drug-induced liver injury.  His Imuran was started recently for myasthenia gravis, and can cause hepatotoxicity.   Would recommend contacting his primary care provider or neurology who started the medication, to see if it is safe to hold it. Would also recommend holding any other hepatotoxic drugs at this time.  Such as Tylenol that I see in his medication list. Would also recommend holding Lipitor as an inpatient Obtain right upper quadrant ultrasound for elevated liver enzymes.  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 Estanislado Pandy   Thank you for involving me in the care of this patient.      LOS: 0 days   Virgel Manifold, MD  01/13/2018, 9:22 AM

## 2018-01-13 NOTE — H&P (Signed)
Bob Warren is an 72 y.o. male.   Chief Complaint: GI bleed HPI: The patient with past medical history of myasthenia gravis, hypertension and diabetes presents to the emergency department from [facility] for further evaluation of GI bleeding.  He reported vomiting dark blood.  He has a history of epistaxis as well.  The patient received blood transfusion in route and arrived in stable condition.  He denies pain.  Hospitalist service was asked to admit for medical management.  Past Medical History:  Diagnosis Date  . Diabetes ([place])   . High cholesterol   . Hypertension   . Myasthenia gravis [facility])     Past Surgical History:  Procedure Laterality Date  . SKIN CANCER EXCISION Right 2016   Arm    Family History  Problem Relation Age of Onset  . Cancer Mother   . Other Father        Gunshot wound  . Diabetes Maternal Grandfather    Social History:  reports that he has quit smoking. He has never used smokeless tobacco. He reports that he does not drink alcohol or use drugs.  Allergies:  Allergies  Allergen Reactions  . Lisinopril     Other reaction(s): Other (See Comments) Cough    Medications Prior to Admission  Medication Sig Dispense Refill  . amLODipine (NORVASC) 10 MG tablet Take 10 mg by mouth.    Marland Kitchen aspirin 81 MG chewable tablet Chew 81 mg by mouth.    Marland Kitchen atorvastatin (LIPITOR) 20 MG tablet Take 20 mg by mouth.    . azaTHIOprine (IMURAN) 50 MG tablet Take 2 tablets (100 mg total) by mouth 2 (two) times daily. One tab po twice a day for one week, then 2 tabs twice a day 120 tablet 11  . Cholecalciferol (VITAMIN D3) 1000 units CAPS Take by mouth.    . hydrochlorothiazide (HYDRODIURIL) 25 MG tablet Take 25 mg by mouth daily.    . Insulin Glargine (LANTUS [place]) Inject 46 Units into the skin daily.     Marland Kitchen losartan (COZAAR) 25 MG tablet Take 25 mg by mouth 2 (two) times daily.     . metFORMIN (GLUCOPHAGE) 850 MG tablet Take 850 mg by mouth.    . Omega-3 Fatty Acids  (FISH OIL) 1000 MG CAPS Take 2 capsules by mouth daily.    . ondansetron (ZOFRAN ODT) 4 MG disintegrating tablet Take 1 tablet (4 mg total) by mouth every 8 (eight) hours as needed. 30 tablet 6  . oxybutynin (DITROPAN-XL) 5 MG 24 hr tablet Take 1 tablet (5 mg total) by mouth at bedtime. 30 tablet 11  . polyethylene glycol (MIRALAX / GLYCOLAX) packet Take 17 g by mouth daily.    . ranitidine (ZANTAC) 150 MG tablet Take 150 mg by mouth 2 (two) times daily.      No results found for this or any previous visit (from the past 48 hour(s)). No results found.  Review of Systems  Constitutional: Negative for chills and fever.  HENT: Negative for sore throat and tinnitus.   Eyes: Negative for blurred vision and redness.  Respiratory: Negative for cough and shortness of breath.   Cardiovascular: Negative for chest pain, palpitations, orthopnea and PND.  Gastrointestinal: Negative for abdominal pain, diarrhea, nausea and vomiting.  Genitourinary: Negative for dysuria, frequency and urgency.  Musculoskeletal: Negative for joint pain and myalgias.  Skin: Negative for rash.       No lesions  Neurological: Negative for speech change, focal weakness and weakness.  Endo/Heme/Allergies: Does not bruise/bleed easily.       No temperature intolerance  Psychiatric/Behavioral: Negative for depression and suicidal ideas.    There were no vitals taken for this visit. Physical Exam  Vitals reviewed. Constitutional: He is oriented to person, place, and time. He appears well-developed and well-nourished.  HENT:  Head: Normocephalic and atraumatic.  Mouth/Throat: Oropharynx is clear and moist.  Eyes: Pupils are equal, round, and reactive to light. Conjunctivae and EOM are normal. No scleral icterus.  Neck: Normal range of motion. Neck supple. No JVD present. No tracheal deviation present. No thyromegaly present.  Cardiovascular: Normal rate, regular rhythm and normal heart sounds. Exam reveals no gallop and  no friction rub.  No murmur heard. Respiratory: Effort normal and breath sounds normal. No respiratory distress. He has no wheezes.  GI: Soft. Bowel sounds are normal. He exhibits no distension. There is no tenderness.  Genitourinary:  Genitourinary Comments: Deferred  Musculoskeletal: Normal range of motion. He exhibits no edema.  Lymphadenopathy:    He has no cervical adenopathy.  Neurological: He is alert and oriented to person, place, and time. No cranial nerve deficit.  Skin: Skin is warm and dry. No erythema.  Psychiatric: He has a normal mood and affect. His behavior is normal. Judgment and thought content normal.     Assessment/Plan This is a 72 year old male admitted for GI bleeding. 1.  GI bleed: Upper GI bleed; continue Protonix IV twice daily.  The patient is n.p.o.; gastroenterology consulted. 2.  Hypertension: Controlled; continue amlodipine, hydrochlorothiazide and losartan. 3.  Diabetes mellitus type 2: Continue basal insulin therapy at significantly reduced dose due to n.p.o. status.  Also continue sliding scale insulin. 4.  Myasthenia gravis: Stable; continue azathioprine 5.  DVT prophylaxis: SCD's next 6.  GI prophylaxis: As above The patient is a DNR.  Time spent on admission orders and patient care approximately 45 minutes  Harrie Foreman, MD 01/13/2018, 5:27 AM

## 2018-01-14 ENCOUNTER — Encounter: Payer: Self-pay | Admitting: *Deleted

## 2018-01-14 ENCOUNTER — Inpatient Hospital Stay: Payer: Medicare Other

## 2018-01-14 ENCOUNTER — Inpatient Hospital Stay: Payer: Medicare Other | Admitting: Anesthesiology

## 2018-01-14 ENCOUNTER — Encounter
Admission: EM | Disposition: A | Discharge status: Home / Self Care | Payer: Self-pay | Source: Other Acute Inpatient Hospital | Attending: Internal Medicine

## 2018-01-14 DIAGNOSIS — K210 Gastro-esophageal reflux disease with esophagitis: Principal | ICD-10-CM

## 2018-01-14 DIAGNOSIS — K3189 Other diseases of stomach and duodenum: Secondary | ICD-10-CM

## 2018-01-14 DIAGNOSIS — K766 Portal hypertension: Secondary | ICD-10-CM

## 2018-01-14 DIAGNOSIS — R188 Other ascites: Secondary | ICD-10-CM

## 2018-01-14 DIAGNOSIS — D61811 Other drug-induced pancytopenia: Secondary | ICD-10-CM

## 2018-01-14 DIAGNOSIS — R933 Abnormal findings on diagnostic imaging of other parts of digestive tract: Secondary | ICD-10-CM

## 2018-01-14 DIAGNOSIS — K7460 Unspecified cirrhosis of liver: Secondary | ICD-10-CM

## 2018-01-14 HISTORY — PX: ESOPHAGOGASTRODUODENOSCOPY: SHX5428

## 2018-01-14 LAB — GLUCOSE, CAPILLARY
GLUCOSE-CAPILLARY: 191 mg/dL — AB (ref 65–99)
GLUCOSE-CAPILLARY: 182 mg/dL — AB (ref 65–99)
GLUCOSE-CAPILLARY: 248 mg/dL — AB (ref 65–99)
Glucose-Capillary: 211 mg/dL — ABNORMAL HIGH (ref 65–99)
Glucose-Capillary: 167 mg/dL — ABNORMAL HIGH (ref 65–99)

## 2018-01-14 LAB — VITAMIN B12: VITAMIN B 12: 951 pg/mL — AB (ref 180–914)

## 2018-01-14 LAB — CBC WITH DIFFERENTIAL/PLATELET
BASOS ABS: 0 10*3/uL (ref 0–0.1)
BASOS PCT: 1 %
Eosinophils Absolute: 0 10*3/uL (ref 0–0.7)
Eosinophils Relative: 2 %
HCT: 24.6 % — ABNORMAL LOW (ref 40.0–52.0)
Hemoglobin: 8.7 g/dL — ABNORMAL LOW (ref 13.0–18.0)
Lymphocytes Relative: 29 %
Lymphs Abs: 0.4 10*3/uL — ABNORMAL LOW (ref 1.0–3.6)
MCH: 39.1 pg — ABNORMAL HIGH (ref 26.0–34.0)
MCHC: 35.5 g/dL (ref 32.0–36.0)
MCV: 110 fL — ABNORMAL HIGH (ref 80.0–100.0)
MONO ABS: 0.1 10*3/uL — AB (ref 0.2–1.0)
Monocytes Relative: 5 %
NEUTROS ABS: 1 10*3/uL — AB (ref 1.4–6.5)
Neutrophils Relative %: 63 %
PLATELETS: 40 10*3/uL — AB (ref 150–440)
RBC: 2.24 MIL/uL — AB (ref 4.40–5.90)
RDW: 25.5 % — AB (ref 11.5–14.5)
WBC: 1.5 10*3/uL — AB (ref 3.8–10.6)

## 2018-01-14 LAB — HEPATITIS B DNA, ULTRAQUANTITATIVE, PCR
HBV DNA SERPL PCR-ACNC: NOT DETECTED [IU]/mL
HBV DNA SERPL PCR-LOG IU: UNDETERMINED {Log_IU}/mL

## 2018-01-14 LAB — HEPATITIS PANEL, ACUTE
Hep A IgM: NEGATIVE
Hep B C IgM: NEGATIVE
Hepatitis B Surface Ag: NEGATIVE

## 2018-01-14 LAB — HAPTOGLOBIN

## 2018-01-14 LAB — CERULOPLASMIN: CERULOPLASMIN: 23.7 mg/dL (ref 16.0–31.0)

## 2018-01-14 LAB — HEPATITIS C VRS RNA DETECT BY PCR-QUAL: Hepatitis C Vrs RNA by PCR-Qual: NEGATIVE

## 2018-01-14 LAB — ANTI-SMOOTH MUSCLE ANTIBODY, IGG: F-ACTIN AB IGG: 16 U (ref 0–19)

## 2018-01-14 LAB — MITOCHONDRIAL ANTIBODIES

## 2018-01-14 SURGERY — EGD (ESOPHAGOGASTRODUODENOSCOPY)
Anesthesia: General

## 2018-01-14 MED ORDER — PROPOFOL 500 MG/50ML IV EMUL
INTRAVENOUS | Status: DC | PRN
  Administered 2018-01-14: 130 ug/kg/min via INTRAVENOUS

## 2018-01-14 MED ORDER — INSULIN GLARGINE 100 UNIT/ML ~~LOC~~ SOLN
20.0000 [IU] | Freq: Every day | SUBCUTANEOUS | Status: DC
  Administered 2018-01-14: 20 [IU] via SUBCUTANEOUS
  Filled 2018-01-14 (×2): qty 0.2

## 2018-01-14 MED ORDER — PROPOFOL 10 MG/ML IV BOLUS
INTRAVENOUS | Status: AC
  Filled 2018-01-14: qty 20

## 2018-01-14 MED ORDER — SODIUM CHLORIDE 0.9 % IV SOLN: INTRAVENOUS | Status: DC

## 2018-01-14 MED ORDER — PROPOFOL 10 MG/ML IV BOLUS
INTRAVENOUS | Status: DC | PRN
  Administered 2018-01-14: 50 mg via INTRAVENOUS

## 2018-01-14 MED ORDER — PROPOFOL 500 MG/50ML IV EMUL
INTRAVENOUS | Status: AC
Start: 2018-01-14 — End: ?
  Filled 2018-01-14: qty 50

## 2018-01-14 NOTE — Anesthesia Preprocedure Evaluation (Signed)
Anesthesia Evaluation  Patient identified by MRN, date of birth, ID band Patient awake    Reviewed: Allergy & Precautions, H&P , NPO status , Patient's Chart, lab work & pertinent test results  History of Anesthesia Complications Negative for: history of anesthetic complications  Airway Mallampati: III  TM Distance: <3 FB Neck ROM: limited    Dental  (+) Chipped, Poor Dentition, Missing   Pulmonary neg shortness of breath, former smoker,           Cardiovascular Exercise Tolerance: Poor hypertension, (-) angina(-) Past MI and (-) DOE      Neuro/Psych  Neuromuscular disease negative psych ROS   GI/Hepatic negative GI ROS, Neg liver ROS,   Endo/Other  diabetes, Type 2, Insulin Dependent  Renal/GU negative Renal ROS  negative genitourinary   Musculoskeletal   Abdominal   Peds  Hematology  (+) Blood dyscrasia, ,   Anesthesia Other Findings Patient is NPO appropriate and reports no nausea or vomiting today.    Past Medical History: No date: Diabetes ([place]) No date: High cholesterol No date: Hypertension No date: Myasthenia gravis [facility])  Past Surgical History: No date: COLONOSCOPY 2016: SKIN CANCER EXCISION; Right     Comment:  Arm  BMI    Body Mass Index:  29.15 kg/m      Reproductive/Obstetrics negative OB ROS                             Anesthesia Physical Anesthesia Plan  ASA: IV  Anesthesia Plan: General   Post-op Pain Management:    Induction: Intravenous  PONV Risk Score and Plan: Propofol infusion and TIVA  Airway Management Planned: Natural Airway and Nasal Cannula  Additional Equipment:   Intra-op Plan:   Post-operative Plan:   Informed Consent: I have reviewed the patients History and Physical, chart, labs and discussed the procedure including the risks, benefits and alternatives for the proposed anesthesia with the patient or authorized representative who  has indicated his/her understanding and acceptance.   Dental Advisory Given  Plan Discussed with: Anesthesiologist, CRNA and Surgeon  Anesthesia Plan Comments: (Plan to suspend DNR for procedure.  Patient voiced understanding.   Patient consented for risks of anesthesia including but not limited to:  - adverse reactions to medications - risk of intubation if required - damage to teeth, lips or other oral mucosa - sore throat or hoarseness - Damage to heart, brain, lungs or loss of life  Patient voiced understanding.)        Anesthesia Quick Evaluation

## 2018-01-14 NOTE — Anesthesia Post-op Follow-up Note (Signed)
Anesthesia QCDR form completed.        

## 2018-01-14 NOTE — Progress Notes (Signed)
Vonda Antigua, MD [address], [place], [place], [place], [address] [address], [place], [place], [place], [address] Phone: [phone]  Fax: [phone]   Subjective: Pt reports one black BM today. No Nausea/vomiting or abdominal pain   Objective: Exam: Vital signs in last 24 hours: Vitals:   01/14/18 0418 01/14/18 0424 01/14/18 0726 01/14/18 1339  BP:  (!) 142/59 (!) 127/55 116/60  Pulse:  86 81 79  Resp:  18 16 18   Temp:  97.8 F (36.6 C) 97.8 F (36.6 C) (!) 96.6 F (35.9 C)  TempSrc:  Oral Oral Tympanic  SpO2:  99% 96% 98%  Weight: 208 lb 15.9 oz (94.8 kg)     Height: 5\' 11"  (1.803 m)      Weight change: 3 lb 11.1 oz (1.677 kg)  Intake/Output Summary (Last 24 hours) at 01/14/2018 1350 Last data filed at 01/13/2018 1521 Gross per 24 hour  Intake 1168.75 ml  Output -  Net 1168.75 ml    General: No acute distress, AAO x3 Abd: Soft, NT/ND, No HSM Skin: Warm, no rashes Neck: Supple, Trachea midline   Lab Results: Lab Results  Component Value Date   WBC 1.5 (L) 01/14/2018   HGB 8.7 (L) 01/14/2018   HCT 24.6 (L) 01/14/2018   MCV 110.0 (H) 01/14/2018   PLT 40 (L) 01/14/2018   Micro Results: No results found for this or any previous visit (from the past 240 hour(s)). Studies/Results: US Abdomen Limited Ruq  Result Date: 01/14/2018 CLINICAL DATA:  Elevated LFTs. EXAM: ULTRASOUND ABDOMEN LIMITED RIGHT UPPER QUADRANT COMPARISON:  None. FINDINGS: Gallbladder: Diffuse gallbladder wall thickening. No gallstones. No sonographic Murphy sign noted by sonographer. Common bile duct: Diameter: 3 mm. Liver: No focal lesion identified. Small in size with nodular contour and increased parenchymal echogenicity. Portal vein is patent on color Doppler imaging with normal direction of blood flow towards the liver. Small ascites. IMPRESSION: 1. Cirrhosis with small ascites. 2. Diffuse gallbladder wall thickening without cholelithiasis. Findings are favored to reflect  underlying liver disease. If there is clinical concern for acalculous cholecystitis, further evaluation may be obtained with HIDA scan. Electronically Signed   By: Titus Dubin M.D.   On: 01/14/2018 11:12   Medications:  Scheduled Meds: . [MAR Hold] amLODipine  10 mg Oral Daily  . [MAR Hold] hydrochlorothiazide  25 mg Oral Daily  . [MAR Hold] insulin aspart  0-9 Units Subcutaneous Q4H  . [MAR Hold] insulin glargine  15 Units Subcutaneous QHS  . [MAR Hold] losartan  25 mg Oral BID  . [MAR Hold] omega-3 acid ethyl esters  1 g Oral BID  . [MAR Hold] oxybutynin  5 mg Oral QHS  . [MAR Hold] pantoprazole (PROTONIX) IV  40 mg Intravenous Q12H  . [MAR Hold] polyethylene glycol  17 g Oral Daily   Continuous Infusions: . sodium chloride 20 mL/hr at 01/14/18 1342  . sodium chloride     PRN Meds:.[MAR Hold] ondansetron **OR** [MAR Hold] ondansetron (ZOFRAN) IV   Assessment: Active Problems:   GIB (gastrointestinal bleeding)   Elevated liver enzymes   Other pancytopenia (HCC)    Plan: Proceed with EGD as planned today to evaluate for source of GI bleed PPI IV twice daily  Continue serial CBCs and transfuse PRN Avoid NSAIDs Maintain 2 large-bore IV lines Please page GI with any acute hemodynamic changes, or signs of active GI bleeding  Liver work-up ordered  Continue to hold Imuran as that is likely source of his pancytopenia and  elevated liver enzymes Continue daily CMP. Follow-up pending liver work-up Ultrasound shows cirrhosis with small ascites.  Will await liver work-up to determine any further testing that may need to be needed for cirrhosis. Avoid hepatotoxic drugs Diffuse gallbladder wall thickening without cholelithiasis is also present.  Patient does not have any right upper quadrant pain at all.   Would recommend surgery consult for their opinion on the gallbladder wall thickening seen on ultrasound.   LOS: 1 day   Vonda Antigua, MD 01/14/2018, 1:50 PM

## 2018-01-14 NOTE — Transfer of Care (Addendum)
Immediate Anesthesia Transfer of Care Note  Patient: Bob Warren  Procedure(s) Performed: ESOPHAGOGASTRODUODENOSCOPY (EGD) (N/A )  Patient Location: PACU  Anesthesia Type:General  Level of Consciousness: awake, alert  and oriented  Airway & Oxygen Therapy: Patient Spontanous Breathing and Patient connected to nasal cannula oxygen  Post-op Assessment: Report given to RN and Post -op Vital signs reviewed and stable  Post vital signs: Reviewed and stable  Last Vitals:  Vitals Value Taken Time  BP 95/43 01/14/2018  2:13 PM  Temp    Pulse 72 01/14/2018  2:13 PM  Resp 12 01/14/2018  2:13 PM  SpO2 99 % 01/14/2018  2:13 PM    Last Pain:  Vitals:   01/14/18 1412  TempSrc:   PainSc: 0-No pain         Complications: No apparent anesthesia complications

## 2018-01-14 NOTE — Anesthesia Postprocedure Evaluation (Signed)
Anesthesia Post Note  Patient: Bob Warren  Procedure(s) Performed: ESOPHAGOGASTRODUODENOSCOPY (EGD) (N/A )  Patient location during evaluation: PACU Anesthesia Type: General Level of consciousness: sedated Pain management: pain level controlled Vital Signs Assessment: post-procedure vital signs reviewed and stable Respiratory status: spontaneous breathing and patient connected to nasal cannula oxygen Cardiovascular status: blood pressure returned to baseline and stable Anesthetic complications: no     Last Vitals:  Vitals:   01/14/18 1339 01/14/18 1413  BP: 116/60 (!) 95/43  Pulse: 79 72  Resp: 18 12  Temp: (!) 35.9 C (!) 35.6 C  SpO2: 98% 99%    Last Pain:  Vitals:   01/14/18 1412  TempSrc:   PainSc: 0-No pain                 Makinsey Pepitone Marilynn Rail

## 2018-01-14 NOTE — Progress Notes (Signed)
Inpatient Diabetes Program Recommendations  AACE/ADA: New Consensus Statement on Inpatient Glycemic Control (2015)  Target Ranges:  Prepandial:   less than 140 mg/dL      Peak postprandial:   less than 180 mg/dL (1-2 hours)      Critically ill patients:  140 - 180 mg/dL   Results for Bob Warren, Bob Warren (MRN [id number]) as of 01/14/2018 12:17  Ref. Range 01/13/2018 20:14 01/13/2018 23:17 01/14/2018 04:22 01/14/2018 07:29 01/14/2018 11:19  Glucose-Capillary Latest Ref Range: 65 - 99 mg/dL 274 (H) 251 (H) 211 (H) 191 (H) 182 (H)    Admit: GIB  History: DM  Home DM Meds: Lantus 46 units daily        Metformin 850 mg daily  Current Orders: Lantus 15 units QHS       Novolog Sensitive Correction Scale/ SSI (0-9 units) Q4 hours     Note Lantus 15 units QHS started last PM.  CBGs still mildly elevated today.    MD- Please consider increasing Lantus to 20 units QHS (~50% total home dose)     --Will follow patient during hospitalization--  Wyn Quaker RN, MSN, CDE Diabetes Coordinator Inpatient Glycemic Control Team Team Pager: [phone] (8a-5p)

## 2018-01-14 NOTE — Anesthesia Postprocedure Evaluation (Signed)
Anesthesia Post Note  Patient: Bob Warren  Procedure(s) Performed: ESOPHAGOGASTRODUODENOSCOPY (EGD) (N/A )  Patient location during evaluation: PACU Anesthesia Type: General Level of consciousness: sedated Pain management: pain level controlled Respiratory status: spontaneous breathing and patient connected to nasal cannula oxygen Cardiovascular status: blood pressure returned to baseline Anesthetic complications: no     Last Vitals:  Vitals:   01/14/18 1339 01/14/18 1413  BP: 116/60 (!) 95/43  Pulse: 79 72  Resp: 18 12  Temp: (!) 35.9 C (!) 35.6 C  SpO2: 98% 99%    Last Pain:  Vitals:   01/14/18 1412  TempSrc:   PainSc: 0-No pain                 [name]

## 2018-01-14 NOTE — Progress Notes (Signed)
Pt states that he is a DNR and has the paperwork and form at home with his wife. He states that he will get his wife to bring in the paperwork.

## 2018-01-14 NOTE — Progress Notes (Signed)
[place] at [facility]                                                                                                                                                                                  Patient Demographics   Derrion Tritz, is a 72 y.o. male, DOB - [birth year], VOP:[id number]  Admit date - 01/13/2018   Admitting Physician Lance Coon, MD  Outpatient Primary MD for the patient is [name], MD   LOS - 1  Subjective:  Patient awaiting endoscopy no further bleeding from above Dark tarry stools   Review of Systems:   CONSTITUTIONAL: No documented fever. No fatigue, weakness. No weight gain, no weight loss.  EYES: No blurry or double vision.  ENT: No tinnitus. No postnasal drip. No redness of the oropharynx.  RESPIRATORY: No cough, no wheeze, no hemoptysis. No dyspnea.  CARDIOVASCULAR: No chest pain. No orthopnea. No palpitations. No syncope.  GASTROINTESTINAL: No nausea, no vomiting or diarrhea. No abdominal pain. No melena or positive hematochezia.  GENITOURINARY: No dysuria or hematuria.  ENDOCRINE: No polyuria or nocturia. No heat or cold intolerance.  HEMATOLOGY: No anemia. No bruising. No bleeding.  INTEGUMENTARY: No rashes. No lesions.  MUSCULOSKELETAL: No arthritis. No swelling. No gout.  NEUROLOGIC: No numbness, tingling, or ataxia. No seizure-type activity.  PSYCHIATRIC: No anxiety. No insomnia. No ADD.    Vitals:   Vitals:   01/13/18 2000 01/14/18 0418 01/14/18 0424 01/14/18 0726  BP: (!) 133/54  (!) 142/59 (!) 127/55  Pulse: 84  86 81  Resp: 16  18 16   Temp: (!) 97.3 F (36.3 C)  97.8 F (36.6 C) 97.8 F (36.6 C)  TempSrc: Oral  Oral Oral  SpO2: 99%  99% 96%  Weight:  94.8 kg (208 lb 15.9 oz)    Height:  5' 11"  (1.803 m)      Wt Readings from Last 3 Encounters:  01/14/18 94.8 kg (208 lb 15.9 oz)  01/08/18 92.8 kg (204 lb 8 oz)  10/02/17 91.2 kg (201 lb)     Intake/Output Summary (Last 24 hours) at  01/14/2018 1315 Last data filed at 01/13/2018 1521 Gross per 24 hour  Intake 1168.75 ml  Output -  Net 1168.75 ml    Physical Exam:   GENERAL: Pleasant-appearing in no apparent distress.  HEAD, EYES, EARS, NOSE AND THROAT: Atraumatic, normocephalic. Extraocular muscles are intact. Pupils equal and reactive to light. Sclerae anicteric. No conjunctival injection. No oro-pharyngeal erythema.  NECK: Supple. There is no jugular venous distention. No bruits, no lymphadenopathy, no thyromegaly.  HEART: Regular rate and rhythm,. No murmurs, no rubs, no clicks.  LUNGS: Clear to auscultation bilaterally. No  rales or rhonchi. No wheezes.  ABDOMEN: Soft, flat, nontender, nondistended. Has good bowel sounds. No hepatosplenomegaly appreciated.  EXTREMITIES: No evidence of any cyanosis, clubbing, or peripheral edema.  +2 pedal and radial pulses bilaterally.  NEUROLOGIC: The patient is alert, awake, and oriented x3 with no focal motor or sensory deficits appreciated bilaterally.  SKIN: Moist and warm with no rashes appreciated.  Psych: Not anxious, depressed LN: No inguinal LN enlargement    Antibiotics   Anti-infectives (From admission, onward)   None      Medications   Scheduled Meds: . amLODipine  10 mg Oral Daily  . hydrochlorothiazide  25 mg Oral Daily  . insulin aspart  0-9 Units Subcutaneous Q4H  . insulin glargine  15 Units Subcutaneous QHS  . losartan  25 mg Oral BID  . omega-3 acid ethyl esters  1 g Oral BID  . oxybutynin  5 mg Oral QHS  . pantoprazole (PROTONIX) IV  40 mg Intravenous Q12H  . polyethylene glycol  17 g Oral Daily   Continuous Infusions: . sodium chloride     PRN Meds:.ondansetron **OR** ondansetron (ZOFRAN) IV   Data Review:   Micro Results No results found for this or any previous visit (from the past 240 hour(s)).  Radiology Reports US Abdomen Limited Ruq  Result Date: 01/14/2018 CLINICAL DATA:  Elevated LFTs. EXAM: ULTRASOUND ABDOMEN LIMITED RIGHT  UPPER QUADRANT COMPARISON:  None. FINDINGS: Gallbladder: Diffuse gallbladder wall thickening. No gallstones. No sonographic Murphy sign noted by sonographer. Common bile duct: Diameter: 3 mm. Liver: No focal lesion identified. Small in size with nodular contour and increased parenchymal echogenicity. Portal vein is patent on color Doppler imaging with normal direction of blood flow towards the liver. Small ascites. IMPRESSION: 1. Cirrhosis with small ascites. 2. Diffuse gallbladder wall thickening without cholelithiasis. Findings are favored to reflect underlying liver disease. If there is clinical concern for acalculous cholecystitis, further evaluation may be obtained with HIDA scan. Electronically Signed   By: Titus Dubin M.D.   On: 01/14/2018 11:12     CBC Recent Labs  Lab 01/13/18 0955 01/14/18 0457  WBC 3.0* 1.5*  HGB 9.0* 8.7*  HCT 26.3* 24.6*  PLT 58* 40*  MCV 111.7* 110.0*  MCH 38.2* 39.1*  MCHC 34.2 35.5  RDW 26.9* 25.5*  LYMPHSABS  --  0.4*  MONOABS  --  0.1*  EOSABS  --  0.0  BASOSABS  --  0.0    Chemistries  Recent Labs  Lab 01/13/18 0955  NA 137  K 4.6  CL 102  CO2 16*  GLUCOSE 123*  BUN 57*  CREATININE 2.09*  CALCIUM 7.6*  AST 298*  ALT 196*  ALKPHOS 72  BILITOT 7.5*   ------------------------------------------------------------------------------------------------------------------ estimated creatinine clearance is 38.1 mL/min (A) (by C-G formula based on SCr of 2.09 mg/dL (H)). ------------------------------------------------------------------------------------------------------------------ No results for input(s): HGBA1C in the last 72 hours. ------------------------------------------------------------------------------------------------------------------ No results for input(s): CHOL, HDL, LDLCALC, TRIG, CHOLHDL, LDLDIRECT in the last 72  hours. ------------------------------------------------------------------------------------------------------------------ Recent Labs    01/13/18 0617  TSH 2.607   ------------------------------------------------------------------------------------------------------------------ Recent Labs    01/13/18 1339 01/13/18 1721  VITAMINB12  --  951*  FOLATE  --  22.0  FERRITIN 220  --     Coagulation profile Recent Labs  Lab 01/13/18 0955  INR 1.53    No results for input(s): DDIMER in the last 72 hours.  Cardiac Enzymes No results for input(s): CKMB, TROPONINI, MYOGLOBIN in the last 168 hours.  Invalid input(s): CK ------------------------------------------------------------------------------------------------------------------  Invalid input(s): [place]   This is a 72 year old male admitted for GI bleeding. 1.    Upper GI bleed: Continue Protonix Continue IV Protonix twice daily Follow hemoglobin 2.  Elevated liver function likely related to Imuran therapy Patient receives Imuran every 28 days he received his Imuran last week.  He will need to follow-up with his neurologist to discuss other options.  3.  Pancytopenia likely related to Imuran recommended to hold as outpatient again patient is to discuss with his primary neurologist 4. Hypertension: Controlled; continue amlodipine, hydrochlorothiazide and losartan. 5.  Diabetes mellitus type 2: Continue basal insulin therapy at significantly reduced dose due to n.p.o. status.  Also continue sliding scale insulin. 6.  Myasthenia gravis: Stable; continue azathioprine 7.  DVT prophylaxis: SCD's next 8.  GI prophylaxis: As above       Code Status Orders  (From admission, onward)        Start     Ordered   01/13/18 0528  Do not attempt resuscitation (DNR)  Continuous    Question Answer Comment  In the event of cardiac or respiratory ARREST Do not call a "code blue"   In the event of cardiac or  respiratory ARREST Do not perform Intubation, CPR, defibrillation or ACLS   In the event of cardiac or respiratory ARREST Use medication by any route, position, wound care, and other measures to relive pain and suffering. May use oxygen, suction and manual treatment of airway obstruction as needed for comfort.      01/13/18 0527    Code Status History    Date Active Date Inactive Code Status Order ID Comments User Context   01/13/2018 0527 01/13/2018 0527 Full Code [id number]  Harrie Foreman, MD Inpatient   04/01/2017 0335 04/05/2017 1951 Full Code [id number]  Ivor Costa, MD Inpatient    Advance Directive Documentation     Most Recent Value  Type of Advance Directive  Living will  Pre-existing out of facility DNR order (yellow form or pink MOST form)  -  "MOST" Form in Place?  -           Consults gi, oncology  DVT Prophylaxis  scd's  Lab Results  Component Value Date   PLT 40 (L) 01/14/2018     Time Spent in minutes  57mn Greater than 50% of time spent in care coordination and counseling patient regarding the condition and plan of care.   SDustin FlockM.D on 01/14/2018 at 1:15 PM  Between 7am to 6pm - Pager - [phone]  After 6pm go to [email or web address] - [email or web address]   Office  [phone]

## 2018-01-14 NOTE — Op Note (Addendum)
[facility] Gastroenterology Patient Name: Bob Warren Procedure Date: 01/14/2018 1:36 PM MRN: [id number] Account #: [id number] Date of Birth: 09-Nov-1945 Admit Type: Inpatient Age: 72 Room: [facility] ENDO ROOM 2 Gender: Male Note Status: Finalized Procedure:            Upper GI endoscopy Indications:          Melena Providers:            Varnita B. Bonna Gains MD, MD Referring MD:         Pamala Hurry. Heber , MD (Referring MD) Medicines:            Monitored Anesthesia Care Complications:        No immediate complications. Procedure:            Pre-Anesthesia Assessment:                       - The risks and benefits of the procedure and the                        sedation options and risks were discussed with the                        patient. All questions were answered and informed                        consent was obtained.                       - Patient identification and proposed procedure were                        verified prior to the procedure.                       - ASA Grade Assessment: IV - A patient with severe                        systemic disease that is a constant threat to life.                       After obtaining informed consent, the endoscope was                        passed under direct vision. Throughout the procedure,                        the patient's blood pressure, pulse, and oxygen                        saturations were monitored continuously. The Endoscope                        was introduced through the mouth, and advanced to the                        second part of duodenum. The upper GI endoscopy was                        accomplished with ease. The patient tolerated the  procedure well. Findings:      LA Grade D (one or more mucosal breaks involving at least 75% of       esophageal circumference) esophagitis with no bleeding was found in the       distal esophagus.      There is no endoscopic  evidence of varices in the entire esophagus.      Moderate portal hypertensive gastropathy was found in the gastric fundus.      The examined duodenum was normal. Impression:           - LA Grade D reflux esophagitis.                       - Portal hypertensive gastropathy. This was noted to be                        friable to touch during the procedure, likely due to                        patient's low platelets.                       - Normal examined duodenum.                       - No specimens collected.                       - Esophagitis and Portal Hypertensive Gastropathy in                        the setting of pancytopenia caused patient's melena                        which has syptomatically improved. Recommendation:       - Follow an antireflux regimen.                       - Keep head of bed elevated to 30-45 degrees at all                        times                       - Use Protonix (pantoprazole) 40 mg IV BID.                       - Observe patient's clinical course.                       - Continue Serial CBCs and transfuse PRN                       - Stop NSAID use (like Ibuprofen, Aleeve, Motrin,                        Advil, Meloxicam, Goodie Powder, BC powder etc.) except                        for Aspirin if medically indicated by PCP or cardiology                       -  Clear liquid diet today.                       - Resume regular diet after today if no further active                        bleeding, and drop in Hemoglobin.                       - If patient has further active GI Bleeding, transfuse                        platelets due to low platelets Procedure Code(s):    --- Professional ---                       [phone], Esophagogastroduodenoscopy, flexible, transoral;                        diagnostic, including collection of specimen(s) by                        brushing or washing, when performed (separate procedure) Diagnosis Code(s):    ---  Professional ---                       K21.0, Gastro-esophageal reflux disease with esophagitis                       K76.6, Portal hypertension                       K31.89, Other diseases of stomach and duodenum                       K92.1, Melena (includes Hematochezia) CPT copyright 2017 American Medical Association. All rights reserved. The codes documented in this report are preliminary and upon coder review may  be revised to meet current compliance requirements.  Vonda Antigua, MD Margretta Sidle B. Tahiliani MD, MD 01/14/2018 2:15:04 PM This report has been signed electronically. Number of Addenda: 0 Note Initiated On: 01/14/2018 1:36 PM Estimated Blood Loss: Estimated blood loss: none.      [facility]

## 2018-01-14 NOTE — Progress Notes (Signed)
Hematology/Oncology Progress Note [facility] [phone] [phone]  Patient Care Team: [name], MD as PCP - General (Internal Medicine) [facility], Arletha Pili as Referring Physician (General Practice)   Name of the patient: Bob Warren  [id number]  [birth year]  Date of visit: 01/14/18   INTERVAL HISTORY-  No acute overnight.  Status post ultrasound of the abdomen.  Which showed cirrhosis with small ascites. Status post EGD which showed esophagitis, portal hypertensive gastropathy, friable. Confirmed with patient that he is taking Imuran 2 tablets twice a day.  Review of systems- Review of Systems  Constitutional: Positive for malaise/fatigue. Negative for chills, fever and weight loss.  HENT: Negative for congestion, ear discharge, ear pain, nosebleeds, sinus pain and sore throat.   Eyes: Negative for double vision, photophobia, pain, discharge and redness.  Respiratory: Negative for cough.   Cardiovascular: Negative for chest pain, palpitations, orthopnea, claudication and leg swelling.  Gastrointestinal: Negative for heartburn, nausea and vomiting.  Genitourinary: Negative for dysuria, frequency and hematuria.  Musculoskeletal: Negative for back pain, myalgias and neck pain.  Skin: Negative for itching and rash.  Neurological: Negative for dizziness, tremors, focal weakness and weakness.  Endo/Heme/Allergies: Negative for environmental allergies. Does not bruise/bleed easily.  Psychiatric/Behavioral: Negative for depression and hallucinations.    Allergies  Allergen Reactions  . Lisinopril     Other reaction(s): Other (See Comments) Cough    Patient Active Problem List   Diagnosis Date Noted  . GIB (gastrointestinal bleeding) 01/13/2018  . Elevated liver enzymes   . Other pancytopenia ([place])   . Weakness 05/07/2017  . Myasthenia gravis with acute exacerbation ([place]) 05/07/2017  . Muscle weakness (generalized)  05/07/2017  . HLD (hyperlipidemia) 04/01/2017  . URI (upper respiratory infection) 04/01/2017  . Chronic back pain 04/01/2017  . Hypertension   . Myasthenia gravis ([place]) 03/27/2017  . DM (diabetes mellitus) ([place]) 03/27/2017     Past Medical History:  Diagnosis Date  . Diabetes ([place])   . High cholesterol   . Hypertension   . Myasthenia gravis [facility])      Past Surgical History:  Procedure Laterality Date  . COLONOSCOPY    . SKIN CANCER EXCISION Right 2016   Arm    Social History   Socioeconomic History  . Marital status: Married    Spouse name: Not on file  . Number of children: 2  . Years of education: 82  . Highest education level: Not on file  Occupational History  . Occupation: Retired  Scientific laboratory technician  . Financial resource strain: Not on file  . Food insecurity:    Worry: Not on file    Inability: Not on file  . Transportation needs:    Medical: Not on file    Non-medical: Not on file  Tobacco Use  . Smoking status: Former Research scientist (life sciences)  . Smokeless tobacco: Never Used  . Tobacco comment: Quit 25 yrs ago  Substance and Sexual Activity  . Alcohol use: No    Comment: Quit 25 yrs ago  . Drug use: No  . Sexual activity: Not on file  Lifestyle  . Physical activity:    Days per week: Not on file    Minutes per session: Not on file  . Stress: Not on file  Relationships  . Social connections:    Talks on phone: Not on file    Gets together: Not on file    Attends religious service: Not on file    Active member of club or organization:  Not on file    Attends meetings of clubs or organizations: Not on file    Relationship status: Not on file  . Intimate partner violence:    Fear of current or ex partner: Not on file    Emotionally abused: Not on file    Physically abused: Not on file    Forced sexual activity: Not on file  Other Topics Concern  . Not on file  Social History Narrative   Lives at home w/ his wife   Right-handed   Caffeine: 3-6 cups of coffee  per day     Family History  Problem Relation Age of Onset  . Cancer Mother   . Other Father        Gunshot wound  . Diabetes Maternal Grandfather      Current Facility-Administered Medications:  .  amLODipine (NORVASC) tablet 10 mg, 10 mg, Oral, Daily, Harrie Foreman, MD .  hydrochlorothiazide (HYDRODIURIL) tablet 25 mg, 25 mg, Oral, Daily, Harrie Foreman, MD .  insulin aspart (novoLOG) injection 0-9 Units, 0-9 Units, Subcutaneous, Q4H, Harrie Foreman, MD, 3 Units at 01/14/18 2008 .  insulin glargine (LANTUS) injection 20 Units, 20 Units, Subcutaneous, QHS, Dustin Flock, MD .  losartan (COZAAR) tablet 25 mg, 25 mg, Oral, BID, Harrie Foreman, MD, 25 mg at 01/13/18 2245 .  omega-3 acid ethyl esters (LOVAZA) capsule 1 g, 1 g, Oral, BID, Harrie Foreman, MD, 1 g at 01/13/18 2245 .  ondansetron (ZOFRAN) tablet 4 mg, 4 mg, Oral, Q6H PRN **OR** ondansetron (ZOFRAN) injection 4 mg, 4 mg, Intravenous, Q6H PRN, Harrie Foreman, MD .  oxybutynin (DITROPAN-XL) 24 hr tablet 5 mg, 5 mg, Oral, QHS, Harrie Foreman, MD, 5 mg at 01/13/18 2245 .  pantoprazole (PROTONIX) injection 40 mg, 40 mg, Intravenous, Q12H, Tahiliani, Varnita B, MD, 40 mg at 01/14/18 0808 .  polyethylene glycol (MIRALAX / GLYCOLAX) packet 17 g, 17 g, Oral, Daily, Harrie Foreman, MD   Physical exam:  Vitals:   01/14/18 1420 01/14/18 1430 01/14/18 1443 01/14/18 1531  BP: (!) 110/54 (!) 109/51  125/61  Pulse: 84 75 72 80  Resp: _0 Temp:    97.9 F (36.6 C)  TempSrc:    Oral  SpO2: 98% 97% 97% 99%  Weight:      Height:       Physical Exam  Constitutional: He is oriented to person, place, and time and well-developed, well-nourished, and in no distress.  HENT:  Head: Normocephalic and atraumatic.  Nose: Nose normal.  Mouth/Throat: Oropharynx is clear and moist. No oropharyngeal exudate.  Eyes: Pupils are equal, round, and reactive to light. EOM are normal. Left eye exhibits no  discharge. No scleral icterus.  Pale conjunctivae  Neck: Normal range of motion. Neck supple. No JVD present.  Cardiovascular: Normal rate, regular rhythm and normal heart sounds.  No murmur heard. Pulmonary/Chest: Effort normal and breath sounds normal. No respiratory distress. He has no wheezes. He has no rales.  Abdominal: Soft. He exhibits no distension and no mass. There is no tenderness.  Musculoskeletal: Normal range of motion. He exhibits no edema or tenderness.  Lymphadenopathy:    He has no cervical adenopathy.  Neurological: He is alert and oriented to person, place, and time. No cranial nerve deficit. He exhibits normal muscle tone. Coordination normal.  Skin: Skin is warm and dry. No rash noted. He is not diaphoretic. No erythema.  Psychiatric: Affect and judgment normal.  CMP Latest Ref Rng & Units 01/13/2018  Glucose 65 - 99 mg/dL 123(H)  BUN 6 - 20 mg/dL 57(H)  Creatinine 0.61 - 1.24 mg/dL 2.09(H)  Sodium 135 - 145 mmol/L 137  Potassium 3.5 - 5.1 mmol/L 4.6  Chloride 101 - 111 mmol/L 102  CO2 22 - 32 mmol/L 16(L)  Calcium 8.9 - 10.3 mg/dL 7.6(L)  Total Protein 6.5 - 8.1 g/dL 6.0(L)  Total Bilirubin 0.3 - 1.2 mg/dL 7.5(H)  Alkaline Phos 38 - 126 U/L 72  AST 15 - 41 U/L 298(H)  ALT 17 - 63 U/L 196(H)   CBC Latest Ref Rng & Units 01/14/2018  WBC 3.8 - 10.6 K/uL 1.5(L)  Hemoglobin 13.0 - 18.0 g/dL 8.7(L)  Hematocrit 40.0 - 52.0 % 24.6(L)  Platelets 150 - 440 K/uL 40(L)   RADIOGRAPHIC STUDIES: I have personally reviewed the radiological images as listed and agreed with the findings in the report.  US Abdomen Limited Ruq  Result Date: 01/14/2018 CLINICAL DATA:  Elevated LFTs. EXAM: ULTRASOUND ABDOMEN LIMITED RIGHT UPPER QUADRANT COMPARISON:  None. FINDINGS: Gallbladder: Diffuse gallbladder wall thickening. No gallstones. No sonographic Murphy sign noted by sonographer. Common bile duct: Diameter: 3 mm. Liver: No focal lesion identified. Small in size with  nodular contour and increased parenchymal echogenicity. Portal vein is patent on color Doppler imaging with normal direction of blood flow towards the liver. Small ascites. IMPRESSION: 1. Cirrhosis with small ascites. 2. Diffuse gallbladder wall thickening without cholelithiasis. Findings are favored to reflect underlying liver disease. If there is clinical concern for acalculous cholecystitis, further evaluation may be obtained with HIDA scan. Electronically Signed   By: Titus Dubin M.D.   On: 01/14/2018 11:12    Assessment and plan-  Patient is a 72 y.o. male presented with hematemesis and the pancytopenia.  #Upper GI bleed, status post EGD.  Bleeding source is likely secondary to friable portal hypertension/esophagitis.  Per GI management. #Pancytopenia, most likely drug-induced.  Imuran has been discontinued since admission.  Continue supportive care.  Transfuse PRBC to keep hemoglobin above 7, transfuse platelets if count is less than 10,000 or active bleeding. Hold inpatient bone marrow biopsy since his pancytopenia is likely secondary to.  #Elevated liver enzymes, ultrasound showed cirrhosis with ascites.  For GI management. Thank you for allowing me to participate in the care of this patient.   Earlie Server, MD, PhD Hematology Oncology [place] [facility] Pager- [phone] 01/14/2018

## 2018-01-15 ENCOUNTER — Telehealth: Payer: Self-pay | Admitting: Gastroenterology

## 2018-01-15 ENCOUNTER — Encounter: Payer: Self-pay | Admitting: Gastroenterology

## 2018-01-15 LAB — CBC
HEMATOCRIT: 26 % — AB (ref 40.0–52.0)
Hemoglobin: 9 g/dL — ABNORMAL LOW (ref 13.0–18.0)
MCH: 39 pg — AB (ref 26.0–34.0)
MCHC: 34.7 g/dL (ref 32.0–36.0)
MCV: 112.6 fL — AB (ref 80.0–100.0)
PLATELETS: 43 10*3/uL — AB (ref 150–440)
RBC: 2.31 MIL/uL — ABNORMAL LOW (ref 4.40–5.90)
RDW: 26.3 % — ABNORMAL HIGH (ref 11.5–14.5)
WBC: 1.4 10*3/uL — AB (ref 3.8–10.6)

## 2018-01-15 LAB — GLUCOSE, CAPILLARY
GLUCOSE-CAPILLARY: 117 mg/dL — AB (ref 65–99)
GLUCOSE-CAPILLARY: 210 mg/dL — AB (ref 65–99)
Glucose-Capillary: 129 mg/dL — ABNORMAL HIGH (ref 65–99)
Glucose-Capillary: 151 mg/dL — ABNORMAL HIGH (ref 65–99)

## 2018-01-15 LAB — COMPREHENSIVE METABOLIC PANEL
ALK PHOS: 77 U/L (ref 38–126)
ALT: 210 U/L — ABNORMAL HIGH (ref 17–63)
AST: 229 U/L — AB (ref 15–41)
Albumin: 1.9 g/dL — ABNORMAL LOW (ref 3.5–5.0)
Anion gap: 8 (ref 5–15)
BUN: 52 mg/dL — AB (ref 6–20)
CALCIUM: 7.3 mg/dL — AB (ref 8.9–10.3)
CHLORIDE: 104 mmol/L (ref 101–111)
CO2: 23 mmol/L (ref 22–32)
Creatinine, Ser: 1.53 mg/dL — ABNORMAL HIGH (ref 0.61–1.24)
GFR calc Af Amer: 51 mL/min — ABNORMAL LOW (ref >60)
GFR, EST NON AFRICAN AMERICAN: 44 mL/min — AB (ref >60)
Glucose, Bld: 262 mg/dL — ABNORMAL HIGH (ref 65–99)
Potassium: 3.5 mmol/L (ref 3.5–5.1)
Sodium: 135 mmol/L (ref 135–145)
Total Bilirubin: 8 mg/dL — ABNORMAL HIGH (ref 0.3–1.2)
Total Protein: 5.9 g/dL — ABNORMAL LOW (ref 6.5–8.1)

## 2018-01-15 MED ORDER — SODIUM CHLORIDE 0.9% FLUSH
3.0000 mL | Freq: Two times a day (BID) | INTRAVENOUS | Status: DC
  Administered 2018-01-15: 3 mL via INTRAVENOUS

## 2018-01-15 MED ORDER — NADOLOL 20 MG PO TABS: 20.0000 mg | ORAL_TABLET | Freq: Every day | ORAL | 11 refills | Status: DC

## 2018-01-15 MED ORDER — PANTOPRAZOLE SODIUM 40 MG PO TBEC: 40.0000 mg | DELAYED_RELEASE_TABLET | Freq: Every day | ORAL | 1 refills | Status: DC

## 2018-01-15 NOTE — Progress Notes (Addendum)
Bob Warren to be D/C'd Home per MD order.  Discussed prescriptions and follow up appointments with the patient. Prescriptions were e-prescribed to patient, medication list explained in detail. Pt verbalized understanding. Wife at  bedside  Allergies as of 01/15/2018      Reactions   Lisinopril    Other reaction(s): Other (See Comments) Cough      Medication List    STOP taking these medications   amLODipine 10 MG tablet Commonly known as:  NORVASC   azaTHIOprine 50 MG tablet Commonly known as:  IMURAN   hydrochlorothiazide 25 MG tablet Commonly known as:  HYDRODIURIL     TAKE these medications   aspirin 81 MG chewable tablet Chew 81 mg by mouth daily.   atorvastatin 20 MG tablet Commonly known as:  LIPITOR Take 20 mg by mouth.   diltiazem 120 MG 24 hr capsule Commonly known as:  DILACOR XR Take 120 mg by mouth daily.   Fish Oil 1000 MG Caps Take 2 capsules by mouth daily.   LANTUS [place] Inject 46 Units into the skin daily.   losartan 25 MG tablet Commonly known as:  COZAAR Take 25 mg by mouth daily.   metFORMIN 850 MG tablet Commonly known as:  GLUCOPHAGE Take 850 mg by mouth daily with breakfast.   nadolol 20 MG tablet Commonly known as:  CORGARD Take 1 tablet (20 mg total) by mouth daily.   ondansetron 4 MG disintegrating tablet Commonly known as:  ZOFRAN ODT Take 1 tablet (4 mg total) by mouth every 8 (eight) hours as needed.   oxybutynin 5 MG 24 hr tablet Commonly known as:  DITROPAN-XL Take 1 tablet (5 mg total) by mouth at bedtime.   pantoprazole 40 MG tablet Commonly known as:  PROTONIX Take 1 tablet (40 mg total) by mouth daily.   polyethylene glycol powder powder Commonly known as:  GLYCOLAX/MIRALAX Take 17 g by mouth daily as needed.   ranitidine 150 MG tablet Commonly known as:  ZANTAC Take 150 mg by mouth 2 (two) times daily.   Vitamin D3 1000 units Caps Take 1 capsule by mouth daily.       Vitals:   01/15/18 0417 01/15/18 0748   BP: 112/62 115/68  Pulse: 82 85  Resp: 18 14  Temp: 98.4 F (36.9 C) (!) 97.5 F (36.4 C)  SpO2: 98% 97%    Tele box removed and returned. Skin clean, dry and intact without evidence of skin break down, no evidence of skin tears noted. IV catheter discontinued intact. Site without signs and symptoms of complications. Dressing and pressure applied. Pt denies pain at this time. No complaints noted.  An After Visit Summary was printed and given to the patient. Patient escorted via [place], and D/C home via private auto.  Rolley Sims

## 2018-01-15 NOTE — Telephone Encounter (Signed)
LVM for patient to call and schedule a hospital follow up with one of our dr. Fabienne Bruns Bleed

## 2018-01-15 NOTE — Discharge Summary (Signed)
[name] at [facility], 72 y.o., DOB [birth year], MRN [id number]. 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  He reported vomiting dark blood.  Patient was thought to have upper GI bleed.  He was seen by GI initially started on Protonix underwent EGD which showed esophagitis.  Patient has not had any further bleeding.  Hemoglobin is stable.  It is recommended patient follow-up with the GI physician   Pancytopenia felt to be due to Imuran therapy patient strongly recommended to stop the Imuran he was taking this despite being told in December to stop taking this.  He was seen by hematology he will need outpatient follow-up for his pancytopenia may need a bone marrow biopsy  Elevated liver function test felt to be due to medication induced, patient also noticed to have liver cirrhosis likely due to [place].  He needs a repeat of the LFTs when he sees his primary care provider and the GI physician.  Hepatitis is panel is negative.           Consults  GI, hemetology  Significant Tests:  See full reports for all details     US Abdomen Limited Ruq  Result Date: 01/14/2018 CLINICAL DATA:  Elevated LFTs. EXAM: ULTRASOUND ABDOMEN LIMITED RIGHT UPPER QUADRANT COMPARISON:  None. FINDINGS: Gallbladder: Diffuse gallbladder wall thickening. No gallstones. No sonographic Murphy sign noted by  sonographer. Common bile duct: Diameter: 3 mm. Liver: No focal lesion identified. Small in size with nodular contour and increased parenchymal echogenicity. Portal vein is patent on color Doppler imaging with normal direction of blood flow towards the liver. Small ascites. IMPRESSION: 1. Cirrhosis with small ascites. 2. Diffuse gallbladder wall thickening without cholelithiasis. Findings are favored to reflect underlying liver disease. If there is clinical concern for acalculous cholecystitis, further evaluation may be obtained with HIDA scan. Electronically Signed   By: Titus Dubin M.D.   On: 01/14/2018 11:12       Today   Subjective:   Bob Warren patient feeling much better no further bleeding noted  Objective:   Blood pressure 115/68, pulse 85, temperature (!) 97.5 F (36.4 C), resp. rate 14, height _0  (1.803 m), weight 96 kg (211 lb 11.2 oz), SpO2 97 %.  . No intake or output data in the 24 hours ending 01/15/18 1504  Exam VITAL SIGNS: Blood pressure 115/68, pulse 85, temperature (!) 97.5 F (36.4 C), resp. rate 14, height _1  (1.803 m), weight 96 kg (211 lb 11.2 oz), SpO2 97 %.  GENERAL:  72 y.o.-year-old patient lying in the bed with no acute distress.  EYES: Pupils equal, round, reactive to light and accommodation. No scleral icterus. Extraocular muscles intact.  HEENT: Head atraumatic, normocephalic. Oropharynx and nasopharynx clear.  NECK:  Supple, no jugular venous distention. No thyroid enlargement, no tenderness.  LUNGS: Normal breath sounds bilaterally, no wheezing, rales,rhonchi or crepitation. No use of accessory muscles of respiration.  CARDIOVASCULAR: S1, S2 normal. No murmurs, rubs, or gallops.  ABDOMEN:  Soft, nontender, nondistended. Bowel sounds present. No organomegaly or mass.  EXTREMITIES: No pedal edema, cyanosis, or clubbing.  NEUROLOGIC: Cranial nerves II through XII are intact. Muscle strength 5/5 in all extremities. Sensation intact. Gait not  checked.  PSYCHIATRIC: The patient is alert and oriented x 3.  SKIN: No obvious rash, lesion, or ulcer.   Data Review     CBC w Diff:  Lab Results  Component Value Date   WBC 1.4 (LL) 01/15/2018   HGB 9.0 (L) 01/15/2018   HGB 12.1 (L) 07/03/2017   HCT 26.0 (L) 01/15/2018   HCT 35.7 (L) 07/03/2017   PLT 43 (L) 01/15/2018   PLT 167 07/03/2017   LYMPHOPCT 29 01/14/2018   MONOPCT 5 01/14/2018   EOSPCT 2 01/14/2018   BASOPCT 1 01/14/2018   CMP:  Lab Results  Component Value Date   NA 135 01/15/2018   NA 140 07/03/2017   K 3.5 01/15/2018   CL 104 01/15/2018   CO2 23 01/15/2018   BUN 52 (H) 01/15/2018   BUN 22 07/03/2017   CREATININE 1.53 (H) 01/15/2018   PROT 5.9 (L) 01/15/2018   PROT 6.2 07/03/2017   ALBUMIN 1.9 (L) 01/15/2018   ALBUMIN 2.8 (L) 07/03/2017   BILITOT 8.0 (H) 01/15/2018   BILITOT 5.9 (H) 07/03/2017   ALKPHOS 77 01/15/2018   AST 229 (H) 01/15/2018   ALT 210 (H) 01/15/2018  .  Micro Results No results found for this or any previous visit (from the past 240 hour(s)).      Code Status Orders  (From admission, onward)        Start     Ordered   01/13/18 0528  Do not attempt resuscitation (DNR)  Continuous    Question Answer Comment  In the event of cardiac or respiratory ARREST Do not call a "code blue"   In the event of cardiac or respiratory ARREST Do not perform Intubation, CPR, defibrillation or ACLS   In the event of cardiac or respiratory ARREST Use medication by any route, position, wound care, and other measures to relive pain and suffering. May use oxygen, suction and manual treatment of airway obstruction as needed for comfort.      01/13/18 0527    Code Status History    Date Active Date Inactive Code Status Order ID Comments User Context   01/13/2018 0527 01/13/2018 0527 Full Code [id number]  Harrie Foreman, MD Inpatient   04/01/2017 0335 04/05/2017 1951 Full Code [id number]  Ivor Costa, MD Inpatient    Advance Directive Documentation      Most Recent Value  Type of Advance Directive  Healthcare Power of Attorney, Living will, Out of facility DNR (pink MOST or yellow form)  Pre-existing out of facility DNR order (yellow form or pink MOST form)  -  "MOST" Form in Place?  -          Follow-up Information    [name], MD. Go on 01/20/2018.   Specialty:  Internal Medicine Why:  Appointment Time: 2:00pm Contact information: [place] [address] [phone]        Earlie Server, MD In 1 week.   Specialty:  Oncology Why:  pancytopenia; The office will call you with an appointment. Thanks! Contact information: [place] [address] [phone]        Virgel Manifold, MD In 1 week.   Specialty:  Gastroenterology Why:  hosp f/u; The office will call you  with an appointment. Thanks! Contact information: [place] [address] [phone]           Discharge Medications   Allergies as of 01/15/2018      Reactions   Lisinopril    Other reaction(s): Other (See Comments) Cough      Medication List    STOP taking these medications   amLODipine 10 MG tablet Commonly known as:  NORVASC   azaTHIOprine 50 MG tablet Commonly known as:  IMURAN   hydrochlorothiazide 25 MG tablet Commonly known as:  HYDRODIURIL     TAKE these medications   aspirin 81 MG chewable tablet Chew 81 mg by mouth daily.   atorvastatin 20 MG tablet Commonly known as:  LIPITOR Take 20 mg by mouth.   diltiazem 120 MG 24 hr capsule Commonly known as:  DILACOR XR Take 120 mg by mouth daily.   Fish Oil 1000 MG Caps Take 2 capsules by mouth daily.   LANTUS [place] Inject 46 Units into the skin daily.   losartan 25 MG tablet Commonly known as:  COZAAR Take 25 mg by mouth daily.   metFORMIN 850 MG tablet Commonly known as:  GLUCOPHAGE Take 850 mg by mouth daily with breakfast.   nadolol 20 MG tablet Commonly known as:  CORGARD Take 1  tablet (20 mg total) by mouth daily.   ondansetron 4 MG disintegrating tablet Commonly known as:  ZOFRAN ODT Take 1 tablet (4 mg total) by mouth every 8 (eight) hours as needed.   oxybutynin 5 MG 24 hr tablet Commonly known as:  DITROPAN-XL Take 1 tablet (5 mg total) by mouth at bedtime.   pantoprazole 40 MG tablet Commonly known as:  PROTONIX Take 1 tablet (40 mg total) by mouth daily.   polyethylene glycol powder powder Commonly known as:  GLYCOLAX/MIRALAX Take 17 g by mouth daily as needed.   ranitidine 150 MG tablet Commonly known as:  ZANTAC Take 150 mg by mouth 2 (two) times daily.   Vitamin D3 1000 units Caps Take 1 capsule by mouth daily.          Total Time in preparing paper work, data evaluation and todays exam - 63 minutes  Dustin Flock M.D on 01/15/2018 at 3:04 PM [place]  [phone]

## 2018-01-15 NOTE — Progress Notes (Signed)
Bob Antigua, MD [address] [place], [place], [place], [address] [address], [place], [place], [place], [address] Phone: [phone]  Fax: [phone]   Subjective:  Patient sitting up in bed comfortably.  No further nausea or vomiting since presentation.  Hemoglobin stable.  No abdominal pain.  Objective: Exam: Vital signs in last 24 hours: Vitals:   01/14/18 1531 01/15/18 0417 01/15/18 0500 01/15/18 0748  BP: 125/61 112/62  115/68  Pulse: 80 82  85  Resp: '16 18  14  ' Temp: 97.9 F (36.6 C) 98.4 F (36.9 C)  (!) 97.5 F (36.4 C)  TempSrc: Oral Oral    SpO2: 99% 98%  97%  Weight:   211 lb 11.2 oz (96 kg)   Height:       Weight change: 2 lb 11.3 oz (1.226 kg)  Intake/Output Summary (Last 24 hours) at 01/15/2018 1105 Last data filed at 01/14/2018 1405 Gross per 24 hour  Intake 500 ml  Output -  Net 500 ml    General: No acute distress, AAO x3 Abd: Soft, NT/ND, No HSM Skin: Warm, no rashes Neck: Supple, Trachea midline   Lab Results: Lab Results  Component Value Date   WBC 1.4 (LL) 01/15/2018   HGB 9.0 (L) 01/15/2018   HCT 26.0 (L) 01/15/2018   MCV 112.6 (H) 01/15/2018   PLT 43 (L) 01/15/2018   Micro Results: No results found for this or any previous visit (from the past 240 hour(s)). Studies/Results: US Abdomen Limited Ruq  Result Date: 01/14/2018 CLINICAL DATA:  Elevated LFTs. EXAM: ULTRASOUND ABDOMEN LIMITED RIGHT UPPER QUADRANT COMPARISON:  None. FINDINGS: Gallbladder: Diffuse gallbladder wall thickening. No gallstones. No sonographic Murphy sign noted by sonographer. Common bile duct: Diameter: 3 mm. Liver: No focal lesion identified. Small in size with nodular contour and increased parenchymal echogenicity. Portal vein is patent on color Doppler imaging with normal direction of blood flow towards the liver. Small ascites. IMPRESSION: 1. Cirrhosis with small ascites. 2. Diffuse gallbladder wall thickening without cholelithiasis. Findings are  favored to reflect underlying liver disease. If there is clinical concern for acalculous cholecystitis, further evaluation may be obtained with HIDA scan. Electronically Signed   By: Titus Dubin M.D.   On: 01/14/2018 11:12   Medications:  Scheduled Meds: . amLODipine  10 mg Oral Daily  . insulin aspart  0-9 Units Subcutaneous Q4H  . insulin glargine  20 Units Subcutaneous QHS  . omega-3 acid ethyl esters  1 g Oral BID  . oxybutynin  5 mg Oral QHS  . pantoprazole (PROTONIX) IV  40 mg Intravenous Q12H  . polyethylene glycol  17 g Oral Daily  . sodium chloride flush  3 mL Intravenous Q12H  . sodium chloride flush  3 mL Intravenous Q12H   Continuous Infusions: PRN Meds:.ondansetron **OR** ondansetron (ZOFRAN) IV   Assessment: Active Problems:   GIB (gastrointestinal bleeding)   Elevated liver enzymes   Drug-induced pancytopenia (HCC)    Plan: GI bleeding resolved at this time as evidenced by no further hematemesis, stable hemoglobin GI bleeding was due to grade D esophagitis in the setting of low platelets due to patient's pancytopenia Continue PPI IV twice daily Continue serial CBCs If patient has active GI bleeding, transfuse platelets in addition to packed red blood cells Appreciate hematology consultation Continue to hold Imuran as that potentially lead to his pancytopenia and elevated liver enzymes  Ultrasound shows cirrhosis Source of underlying cirrhosis is possibly [name] Liver enzymes are likely acutely elevated due to  drug-induced liver injury from imuran Clinically no evidence of biliary obstruction given normal alk phos, no abdominal pain, and ultrasound showing normal common bile duct and no choledocholithiasis.  Continue to avoid hepatotoxic drugs Autoimmune and viral hepatitis liver work-up has been unrevealing Patient denies any hepatotoxic drug use at home, including Tylenol, alcohol, herbal products or supplements. Underlying cirrhosis due to [place], with  acutely elevated liver enzymes from drug-induced liver injury If liver enzymes do not improve, consider ultrasound liver Doppler Continue daily CMP    LOS: 2 days   Bob Antigua, MD 01/15/2018, 11:05 AM

## 2018-01-16 ENCOUNTER — Telehealth: Payer: Self-pay | Admitting: Gastroenterology

## 2018-01-16 NOTE — Telephone Encounter (Signed)
Left message for patient to call back and schedule a hospital follow up with [place]

## 2018-01-19 ENCOUNTER — Telehealth: Payer: Self-pay | Admitting: Neurology

## 2018-01-19 NOTE — Telephone Encounter (Signed)
Pt was hospitalized on 6/17 for increased liver enzymes. He states imuran was d/c. He is wanting to know what will be prescribed to take the place of it. Please call to advise

## 2018-01-19 NOTE — Telephone Encounter (Signed)
Please call patient, will keep him on IVIG treatment for MG alone,   He couldn Not tolerate imuran, insurance would not cover cellcept based on previous process.  Sharyn Lull, please give him a followup in 2-3 months

## 2018-01-26 ENCOUNTER — Inpatient Hospital Stay (HOSPITAL_COMMUNITY)
Admission: EM | Admit: 2018-01-26 | Discharge: 2018-02-03 | DRG: 871 | Disposition: A | Discharge status: Skilled Nursing Facility | Payer: Medicare Other | Attending: Internal Medicine | Admitting: Internal Medicine

## 2018-01-26 ENCOUNTER — Other Ambulatory Visit: Payer: Self-pay | Admitting: *Deleted

## 2018-01-26 ENCOUNTER — Other Ambulatory Visit: Payer: Self-pay

## 2018-01-26 ENCOUNTER — Encounter (HOSPITAL_COMMUNITY): Payer: Self-pay

## 2018-01-26 ENCOUNTER — Emergency Department (HOSPITAL_COMMUNITY): Payer: Medicare Other

## 2018-01-26 DIAGNOSIS — L03115 Cellulitis of right lower limb: Secondary | ICD-10-CM | POA: Diagnosis present

## 2018-01-26 DIAGNOSIS — N182 Chronic kidney disease, stage 2 (mild): Secondary | ICD-10-CM | POA: Diagnosis present

## 2018-01-26 DIAGNOSIS — E1122 Type 2 diabetes mellitus with diabetic chronic kidney disease: Secondary | ICD-10-CM | POA: Diagnosis present

## 2018-01-26 DIAGNOSIS — R6510 Systemic inflammatory response syndrome (SIRS) of non-infectious origin without acute organ dysfunction: Secondary | ICD-10-CM

## 2018-01-26 DIAGNOSIS — R410 Disorientation, unspecified: Secondary | ICD-10-CM | POA: Diagnosis present

## 2018-01-26 DIAGNOSIS — R262 Difficulty in walking, not elsewhere classified: Secondary | ICD-10-CM | POA: Diagnosis present

## 2018-01-26 DIAGNOSIS — I130 Hypertensive heart and chronic kidney disease with heart failure and stage 1 through stage 4 chronic kidney disease, or unspecified chronic kidney disease: Secondary | ICD-10-CM | POA: Diagnosis present

## 2018-01-26 DIAGNOSIS — G7000 Myasthenia gravis without (acute) exacerbation: Secondary | ICD-10-CM | POA: Diagnosis present

## 2018-01-26 DIAGNOSIS — L0390 Cellulitis, unspecified: Secondary | ICD-10-CM | POA: Diagnosis not present

## 2018-01-26 DIAGNOSIS — I129 Hypertensive chronic kidney disease with stage 1 through stage 4 chronic kidney disease, or unspecified chronic kidney disease: Secondary | ICD-10-CM | POA: Diagnosis present

## 2018-01-26 DIAGNOSIS — K7469 Other cirrhosis of liver: Secondary | ICD-10-CM | POA: Diagnosis not present

## 2018-01-26 DIAGNOSIS — A419 Sepsis, unspecified organism: Secondary | ICD-10-CM | POA: Diagnosis present

## 2018-01-26 DIAGNOSIS — K7460 Unspecified cirrhosis of liver: Secondary | ICD-10-CM | POA: Diagnosis present

## 2018-01-26 DIAGNOSIS — K7290 Hepatic failure, unspecified without coma: Secondary | ICD-10-CM

## 2018-01-26 DIAGNOSIS — R188 Other ascites: Secondary | ICD-10-CM | POA: Diagnosis not present

## 2018-01-26 DIAGNOSIS — Z7982 Long term (current) use of aspirin: Secondary | ICD-10-CM

## 2018-01-26 DIAGNOSIS — K7200 Acute and subacute hepatic failure without coma: Secondary | ICD-10-CM | POA: Diagnosis not present

## 2018-01-26 DIAGNOSIS — D61811 Other drug-induced pancytopenia: Secondary | ICD-10-CM

## 2018-01-26 DIAGNOSIS — E669 Obesity, unspecified: Secondary | ICD-10-CM | POA: Diagnosis present

## 2018-01-26 DIAGNOSIS — R600 Localized edema: Secondary | ICD-10-CM | POA: Diagnosis present

## 2018-01-26 DIAGNOSIS — E1169 Type 2 diabetes mellitus with other specified complication: Secondary | ICD-10-CM | POA: Diagnosis present

## 2018-01-26 DIAGNOSIS — L97519 Non-pressure chronic ulcer of other part of right foot with unspecified severity: Secondary | ICD-10-CM | POA: Diagnosis present

## 2018-01-26 DIAGNOSIS — Z794 Long term (current) use of insulin: Secondary | ICD-10-CM

## 2018-01-26 DIAGNOSIS — N170 Acute kidney failure with tubular necrosis: Secondary | ICD-10-CM | POA: Diagnosis not present

## 2018-01-26 DIAGNOSIS — E7800 Pure hypercholesterolemia, unspecified: Secondary | ICD-10-CM | POA: Diagnosis present

## 2018-01-26 DIAGNOSIS — I5033 Acute on chronic diastolic (congestive) heart failure: Secondary | ICD-10-CM | POA: Diagnosis present

## 2018-01-26 DIAGNOSIS — R69 Illness, unspecified: Secondary | ICD-10-CM | POA: Diagnosis not present

## 2018-01-26 DIAGNOSIS — E785 Hyperlipidemia, unspecified: Secondary | ICD-10-CM | POA: Diagnosis present

## 2018-01-26 DIAGNOSIS — Z79899 Other long term (current) drug therapy: Secondary | ICD-10-CM

## 2018-01-26 DIAGNOSIS — E11621 Type 2 diabetes mellitus with foot ulcer: Secondary | ICD-10-CM | POA: Diagnosis present

## 2018-01-26 DIAGNOSIS — Z66 Do not resuscitate: Secondary | ICD-10-CM | POA: Diagnosis present

## 2018-01-26 DIAGNOSIS — Z87891 Personal history of nicotine dependence: Secondary | ICD-10-CM | POA: Diagnosis not present

## 2018-01-26 DIAGNOSIS — Z6829 Body mass index (BMI) 29.0-29.9, adult: Secondary | ICD-10-CM

## 2018-01-26 DIAGNOSIS — I5030 Unspecified diastolic (congestive) heart failure: Secondary | ICD-10-CM | POA: Diagnosis not present

## 2018-01-26 DIAGNOSIS — G7001 Myasthenia gravis with (acute) exacerbation: Secondary | ICD-10-CM | POA: Diagnosis present

## 2018-01-26 DIAGNOSIS — M79609 Pain in unspecified limb: Secondary | ICD-10-CM | POA: Diagnosis not present

## 2018-01-26 DIAGNOSIS — Z888 Allergy status to other drugs, medicaments and biological substances status: Secondary | ICD-10-CM

## 2018-01-26 DIAGNOSIS — R609 Edema, unspecified: Secondary | ICD-10-CM | POA: Diagnosis not present

## 2018-01-26 DIAGNOSIS — I5031 Acute diastolic (congestive) heart failure: Secondary | ICD-10-CM | POA: Diagnosis not present

## 2018-01-26 DIAGNOSIS — I4891 Unspecified atrial fibrillation: Secondary | ICD-10-CM | POA: Diagnosis not present

## 2018-01-26 DIAGNOSIS — I10 Essential (primary) hypertension: Secondary | ICD-10-CM | POA: Diagnosis present

## 2018-01-26 DIAGNOSIS — E876 Hypokalemia: Secondary | ICD-10-CM | POA: Diagnosis not present

## 2018-01-26 LAB — COMPREHENSIVE METABOLIC PANEL
ALK PHOS: 99 U/L (ref 38–126)
ALT: 61 U/L — ABNORMAL HIGH (ref 0–44)
AST: 65 U/L — AB (ref 15–41)
Albumin: 1.9 g/dL — ABNORMAL LOW (ref 3.5–5.0)
Anion gap: 10 (ref 5–15)
BUN: 21 mg/dL (ref 8–23)
CALCIUM: 7.9 mg/dL — AB (ref 8.9–10.3)
CHLORIDE: 105 mmol/L (ref 98–111)
CO2: 24 mmol/L (ref 22–32)
CREATININE: 1.54 mg/dL — AB (ref 0.61–1.24)
GFR calc Af Amer: 51 mL/min — ABNORMAL LOW (ref >60)
GFR calc non Af Amer: 44 mL/min — ABNORMAL LOW (ref >60)
GLUCOSE: 196 mg/dL — AB (ref 70–99)
Potassium: 3.4 mmol/L — ABNORMAL LOW (ref 3.5–5.1)
SODIUM: 139 mmol/L (ref 135–145)
Total Bilirubin: 2.2 mg/dL — ABNORMAL HIGH (ref 0.3–1.2)
Total Protein: 6.8 g/dL (ref 6.5–8.1)

## 2018-01-26 LAB — URINALYSIS, ROUTINE W REFLEX MICROSCOPIC
Bacteria, UA: NONE SEEN
GLUCOSE, UA: 50 mg/dL — AB
Hgb urine dipstick: NEGATIVE
Ketones, ur: NEGATIVE mg/dL
LEUKOCYTES UA: NEGATIVE
Nitrite: NEGATIVE
PH: 5 (ref 5.0–8.0)
PROTEIN: 30 mg/dL — AB
SPECIFIC GRAVITY, URINE: 1.023 (ref 1.005–1.030)

## 2018-01-26 LAB — PROTIME-INR
INR: 1.25
Prothrombin Time: 15.6 seconds — ABNORMAL HIGH (ref 11.4–15.2)

## 2018-01-26 LAB — I-STAT CG4 LACTIC ACID, ED: Lactic Acid, Venous: 3.04 mmol/L (ref 0.5–1.9)

## 2018-01-26 MED ORDER — SODIUM CHLORIDE 0.9 % IV SOLN
1000.0000 mL | INTRAVENOUS | Status: DC
  Administered 2018-01-26: 1000 mL via INTRAVENOUS

## 2018-01-26 MED ORDER — CEFAZOLIN SODIUM-DEXTROSE 1-4 GM/50ML-% IV SOLN
1.0000 g | Freq: Once | INTRAVENOUS | Status: AC
  Administered 2018-01-26: 1 g via INTRAVENOUS
  Filled 2018-01-26: qty 50

## 2018-01-26 MED ORDER — ACETAMINOPHEN 325 MG PO TABS
650.0000 mg | ORAL_TABLET | Freq: Once | ORAL | Status: AC | PRN
  Administered 2018-01-26: 650 mg via ORAL
  Filled 2018-01-26: qty 2

## 2018-01-26 MED ORDER — SODIUM CHLORIDE 0.9 % IV BOLUS (SEPSIS)
1000.0000 mL | Freq: Once | INTRAVENOUS | Status: AC
  Administered 2018-01-26: 1000 mL via INTRAVENOUS

## 2018-01-26 NOTE — ED Notes (Signed)
Report attempted 

## 2018-01-26 NOTE — ED Provider Notes (Signed)
[facility] EMERGENCY DEPARTMENT Provider Note   CSN: [id number] Arrival date & time: 01/26/18  2003     History   Chief Complaint Chief Complaint  Patient presents with  . Weakness    HPI Bob Warren is a 72 y.o. male.  HPI Patient has complex medical history including myasthenia gravis, hypertension and diabetes.  He was just discharged from the hospital 10 days ago for GI bleed and pancytopenia thought to be due to Imuran.  Patient and family members feel that he has had a slow decline since his discharge.  He got very weak over the past 2 days.  At baseline he has not very active but can usually transition around the house for normal ADLs.  He is a general weakness and difficult getting out of his recliner.  He started to get chills this afternoon.  He apparently went to see his primary care doctor today and did not think he was feeling too badly at that time.  He did point out a wound on his right foot that earlier today did not suggest infection per evaluation.  The wound is a result of a blister that developed due to edema of the lower legs.  The blister subsequently ruptured and now has a eschar on it.  Patient was due to see hematology tomorrow regarding his pancytopenia.  He reports he has not taken Imuran for several weeks. Past Medical History:  Diagnosis Date  . Diabetes ([place])   . High cholesterol   . Hypertension   . Myasthenia gravis [facility])     Patient Active Problem List   Diagnosis Date Noted  . GIB (gastrointestinal bleeding) 01/13/2018  . Elevated liver enzymes   . Drug-induced pancytopenia ([place])   . Weakness 05/07/2017  . Myasthenia gravis with acute exacerbation ([place]) 05/07/2017  . Muscle weakness (generalized) 05/07/2017  . HLD (hyperlipidemia) 04/01/2017  . URI (upper respiratory infection) 04/01/2017  . Chronic back pain 04/01/2017  . Hypertension   . Myasthenia gravis ([place]) 03/27/2017  . DM (diabetes mellitus) ([place]) 03/27/2017     Past Surgical History:  Procedure Laterality Date  . COLONOSCOPY    . ESOPHAGOGASTRODUODENOSCOPY N/A 01/14/2018   Procedure: ESOPHAGOGASTRODUODENOSCOPY (EGD);  Surgeon: Virgel Manifold, MD;  Location: [facility] ENDOSCOPY;  Service: Endoscopy;  Laterality: N/A;  . SKIN CANCER EXCISION Right 2016   Arm        Home Medications    Prior to Admission medications   Medication Sig Start Date End Date Taking? Authorizing Provider  aspirin EC 81 MG tablet Take 81 mg by mouth daily.   Yes [provider]  atorvastatin (LIPITOR) 20 MG tablet Take 20 mg by mouth daily.    Yes [provider]  Cholecalciferol (VITAMIN D3) 1000 units CAPS Take 1,000 Units by mouth daily.    Yes [provider]  diltiazem (DILACOR XR) 120 MG 24 hr capsule Take 120 mg by mouth daily.   Yes [provider]  furosemide (LASIX) 40 MG tablet Take 40 mg by mouth 2 (two) times daily. 01/23/18  Yes [provider]  insulin glargine (LANTUS) 100 UNIT/ML injection Inject 20 Units into the skin at bedtime.   Yes [provider]  losartan (COZAAR) 25 MG tablet Take 25 mg by mouth daily.  05/22/15  Yes [provider]  metFORMIN (GLUCOPHAGE) 850 MG tablet Take 850 mg by mouth 2 (two) times daily with a meal.    Yes [provider]  nadolol (CORGARD) 20  MG tablet Take 1 tablet (20 mg total) by mouth daily. 01/15/18 01/15/19 Yes Dustin Flock, MD  oxybutynin (DITROPAN-XL) 5 MG 24 hr tablet Take 1 tablet (5 mg total) by mouth at bedtime. 05/07/17  Yes Marcial Pacas, MD  pantoprazole (PROTONIX) 40 MG tablet Take 1 tablet (40 mg total) by mouth daily. 01/15/18 01/15/19 Yes Dustin Flock, MD  PRESCRIPTION MEDICATION Inject into the vein every 30 (thirty) days. Infusion at [facility] for myasthenia gravis   Yes [provider]  ranitidine (ZANTAC) 150 MG tablet Take 150 mg by mouth 2 (two) times daily.   Yes [provider]  silver  sulfADIAZINE (SILVADENE) 1 % cream Apply 1 application topically 2 (two) times daily.   Yes [provider]  spironolactone (ALDACTONE) 25 MG tablet Take 25 mg by mouth 2 (two) times daily as needed (fluid/swelling).  01/23/18  Yes [provider]  ondansetron (ZOFRAN ODT) 4 MG disintegrating tablet Take 1 tablet (4 mg total) by mouth every 8 (eight) hours as needed. Patient not taking: Reported on 01/13/2018 05/07/17   Marcial Pacas, MD    Family History Family History  Problem Relation Age of Onset  . Cancer Mother   . Other Father        Gunshot wound  . Diabetes Maternal Grandfather     Social History Social History   Tobacco Use  . Smoking status: Former Research scientist (life sciences)  . Smokeless tobacco: Never Used  . Tobacco comment: Quit 25 yrs ago  Substance Use Topics  . Alcohol use: No    Comment: Quit 25 yrs ago  . Drug use: No     Allergies   Imuran [azathioprine] and Lisinopril   Review of Systems Review of Systems 10 Systems reviewed and are negative for acute change except as noted in the HPI.   Physical Exam Updated Vital Signs BP (!) 109/54   Pulse 79   Temp (S) (!) 102.5 F (39.2 C) (Oral)   Resp (!) 24   Ht 5' 11"  (1.803 m)   Wt 95.3 kg (210 lb)   SpO2 100%   BMI 29.29 kg/m   Physical Exam  Constitutional: He is oriented to person, place, and time. He appears well-developed and well-nourished.  Patient is alert and nontoxic without respiratory distress.  He does have general pale appearance.  HENT:  Head: Normocephalic and atraumatic.  Mouth/Throat: Oropharynx is clear and moist.  Eyes: Pupils are equal, round, and reactive to light. EOM are normal.  Neck: Neck supple.  Cardiovascular: Normal rate, regular rhythm, normal heart sounds and intact distal pulses.  Pulmonary/Chest: Effort normal and breath sounds normal.  Abdominal: Soft. Bowel sounds are normal. He exhibits no distension. There is no tenderness. There is no guarding.    Musculoskeletal: Normal range of motion. He exhibits edema.  2+ edema bilateral lower extremities.  eschar and diffuse erythema as pictured.  Neurological: He is alert and oriented to person, place, and time. He has normal strength. He exhibits normal muscle tone. Coordination normal. GCS eye subscore is 4. GCS verbal subscore is 5. GCS motor subscore is 6.  Skin: Skin is warm, dry and intact.  Psychiatric: He has a normal mood and affect.           ED Treatments / Results  Labs (all labs ordered are listed, but only abnormal results are displayed) Labs Reviewed  COMPREHENSIVE METABOLIC PANEL - Abnormal; Notable for the following components:      Result Value   Potassium 3.4 (*)  Glucose, Bld 196 (*)    Creatinine, Ser 1.54 (*)    Calcium 7.9 (*)    Albumin 1.9 (*)    AST 65 (*)    ALT 61 (*)    Total Bilirubin 2.2 (*)    GFR calc non Af Amer 44 (*)    GFR calc Af Amer 51 (*)    All other components within normal limits  CBC WITH DIFFERENTIAL/PLATELET - Abnormal; Notable for the following components:   WBC 2.3 (*)    RBC 2.32 (*)    Hemoglobin 8.8 (*)    HCT 26.7 (*)    MCV 115.1 (*)    MCH 37.9 (*)    RDW 21.3 (*)    Platelets 113 (*)    Neutro Abs 1.4 (*)    Lymphs Abs 0.5 (*)    All other components within normal limits  PROTIME-INR - Abnormal; Notable for the following components:   Prothrombin Time 15.6 (*)    All other components within normal limits  URINALYSIS, ROUTINE W REFLEX MICROSCOPIC - Abnormal; Notable for the following components:   Color, Urine AMBER (*)    Glucose, UA 50 (*)    Bilirubin Urine SMALL (*)    Protein, ur 30 (*)    All other components within normal limits  I-STAT CG4 LACTIC ACID, ED - Abnormal; Notable for the following components:   Lactic Acid, Venous 3.04 (*)    All other components within normal limits  CULTURE, BLOOD (ROUTINE X 2)  CULTURE, BLOOD (ROUTINE X 2)  URINE CULTURE  I-STAT CG4 LACTIC ACID, ED    EKG EKG  Interpretation  Date/Time:  Monday January 26 2018 20:09:01 EDT Ventricular Rate:  81 PR Interval:    QRS Duration: 85 QT Interval:  385 QTC Calculation: 447 R Axis:   41 Text Interpretation:  Sinus rhythm Atrial premature complex Borderline prolonged PR interval Low voltage, precordial leads Repol abnrm suggests ischemia, diffuse leads no change from previous Confirmed by Charlesetta Shanks [id number]) on 01/26/2018 9:14:42 PM   Radiology Dg Chest 2 View  Result Date: 01/26/2018 CLINICAL DATA:  Sepsis EXAM: CHEST - 2 VIEW COMPARISON:  05/09/2017 FINDINGS: The heart size and mediastinal contours are within normal limits. Both lungs are clear. Mild aortic atherosclerosis. The visualized skeletal structures are unremarkable. IMPRESSION: No active cardiopulmonary disease. Electronically Signed   By: Donavan Foil M.D.   On: 01/26/2018 21:33    Procedures Procedures (including critical care time) CRITICAL CARE Performed by: Charlesetta Shanks   Total critical care time: 30 minutes  Critical care time was exclusive of separately billable procedures and treating other patients.  Critical care was necessary to treat or prevent imminent or life-threatening deterioration.  Critical care was time spent personally by me on the following activities: development of treatment plan with patient and/or surrogate as well as nursing, discussions with consultants, evaluation of patient's response to treatment, examination of patient, obtaining history from patient or surrogate, ordering and performing treatments and interventions, ordering and review of laboratory studies, ordering and review of radiographic studies, pulse oximetry and re-evaluation of patient's condition. Medications Ordered in ED Medications  sodium chloride 0.9 % bolus 1,000 mL (1,000 mLs Intravenous New Bag/Given 01/26/18 2155)    Followed by  0.9 %  sodium chloride infusion (1,000 mLs Intravenous New Bag/Given 01/26/18 2156)  acetaminophen  (TYLENOL) tablet 650 mg (650 mg Oral Given 01/26/18 2040)  ceFAZolin (ANCEF) IVPB 1 g/50 mL premix (1 g Intravenous New Bag/Given 01/26/18 2156)  Initial Impression / Assessment and Plan / ED Course  I have reviewed the triage vital signs and the nursing notes.  Pertinent labs & imaging results that were available during my care of the patient were reviewed by me and considered in my medical decision making (see chart for details).      Final Clinical Impressions(s) / ED Diagnoses   Final diagnoses:  SIRS (systemic inflammatory response syndrome) (HCC)  Cellulitis of right lower extremity  Severe comorbid illness   Patient has complex medical history.  He had pre-existing pancytopenia thought to be secondary to Imuran.  He also has history of myasthenia gravis.  He does not currently have symptoms of an acute myasthenia crisis. no Difficulty breathing or facial weakness.  Weakness is more generalized and lower extremities with transferring.  No focal neurologic deficits.  Mental status is clear.  Patient is alert.  Symptoms became worse fairly abruptly today.  He developed chills and has fever to 102 here in the emergency department.  Likely source appears to be wound on the foot.  There is now diffuse erythema of the right foot and lower leg as compared to the left.  Review of systems otherwise does not suggest other etiology nor does diagnostic evaluation to this point.  Plan will be for admission on IV antibiotics and continued monitoring. ED Discharge Orders    None       Charlesetta Shanks, MD 01/26/18 2252

## 2018-01-26 NOTE — ED Triage Notes (Addendum)
Pt BIB [facility] for eval of gradually worsening generalized weakness throughout the day. Pt reports hx of cirrhosis, seen at PCP this AM for well visit w/ no problems at that time. Pt reports throughout the day he has felt gradually weaker, no focal neuro deficits on triage. Reports mild SOB, denies CP, N/V. Pt w/ obvious open would to R foot w/ drainage and surrounding erythema. Reports wound present for 2-3 weeks.

## 2018-01-27 ENCOUNTER — Inpatient Hospital Stay (HOSPITAL_COMMUNITY): Payer: Medicare Other

## 2018-01-27 ENCOUNTER — Inpatient Hospital Stay: Payer: Medicare Other | Admitting: Oncology

## 2018-01-27 ENCOUNTER — Inpatient Hospital Stay: Payer: Medicare Other

## 2018-01-27 ENCOUNTER — Encounter (HOSPITAL_COMMUNITY): Payer: Medicare Other

## 2018-01-27 ENCOUNTER — Other Ambulatory Visit: Payer: Self-pay

## 2018-01-27 DIAGNOSIS — G7000 Myasthenia gravis without (acute) exacerbation: Secondary | ICD-10-CM

## 2018-01-27 DIAGNOSIS — M79609 Pain in unspecified limb: Secondary | ICD-10-CM

## 2018-01-27 DIAGNOSIS — L0390 Cellulitis, unspecified: Secondary | ICD-10-CM

## 2018-01-27 DIAGNOSIS — L03115 Cellulitis of right lower limb: Secondary | ICD-10-CM | POA: Insufficient documentation

## 2018-01-27 DIAGNOSIS — E1169 Type 2 diabetes mellitus with other specified complication: Secondary | ICD-10-CM

## 2018-01-27 DIAGNOSIS — D61811 Other drug-induced pancytopenia: Secondary | ICD-10-CM

## 2018-01-27 DIAGNOSIS — A419 Sepsis, unspecified organism: Principal | ICD-10-CM

## 2018-01-27 DIAGNOSIS — I5030 Unspecified diastolic (congestive) heart failure: Secondary | ICD-10-CM

## 2018-01-27 DIAGNOSIS — E669 Obesity, unspecified: Secondary | ICD-10-CM

## 2018-01-27 LAB — CREATININE, SERUM
Creatinine, Ser: 1.46 mg/dL — ABNORMAL HIGH (ref 0.61–1.24)
GFR calc Af Amer: 54 mL/min — ABNORMAL LOW (ref >60)
GFR calc non Af Amer: 47 mL/min — ABNORMAL LOW (ref >60)

## 2018-01-27 LAB — MRSA PCR SCREENING: MRSA by PCR: NEGATIVE

## 2018-01-27 LAB — COMPREHENSIVE METABOLIC PANEL
ALT: 53 U/L — AB (ref 0–44)
AST: 58 U/L — AB (ref 15–41)
Albumin: 1.8 g/dL — ABNORMAL LOW (ref 3.5–5.0)
Alkaline Phosphatase: 84 U/L (ref 38–126)
Anion gap: 10 (ref 5–15)
BUN: 23 mg/dL (ref 8–23)
CHLORIDE: 108 mmol/L (ref 98–111)
CO2: 20 mmol/L — AB (ref 22–32)
Calcium: 7.4 mg/dL — ABNORMAL LOW (ref 8.9–10.3)
Creatinine, Ser: 1.43 mg/dL — ABNORMAL HIGH (ref 0.61–1.24)
GFR calc Af Amer: 55 mL/min — ABNORMAL LOW (ref >60)
GFR, EST NON AFRICAN AMERICAN: 48 mL/min — AB (ref >60)
Glucose, Bld: 188 mg/dL — ABNORMAL HIGH (ref 70–99)
POTASSIUM: 3.6 mmol/L (ref 3.5–5.1)
SODIUM: 138 mmol/L (ref 135–145)
Total Bilirubin: 2.2 mg/dL — ABNORMAL HIGH (ref 0.3–1.2)
Total Protein: 6.1 g/dL — ABNORMAL LOW (ref 6.5–8.1)

## 2018-01-27 LAB — CBC WITH DIFFERENTIAL/PLATELET
BASOS ABS: 0 10*3/uL (ref 0.0–0.1)
BASOS ABS: 0 10*3/uL (ref 0.0–0.1)
BASOS PCT: 1 %
Basophils Relative: 1 %
EOS PCT: 2 %
Eosinophils Absolute: 0 10*3/uL (ref 0.0–0.7)
Eosinophils Absolute: 0.1 10*3/uL (ref 0.0–0.7)
Eosinophils Relative: 4 %
HCT: 26.5 % — ABNORMAL LOW (ref 39.0–52.0)
HEMATOCRIT: 26.7 % — AB (ref 39.0–52.0)
HEMOGLOBIN: 8.8 g/dL — AB (ref 13.0–17.0)
Hemoglobin: 8.6 g/dL — ABNORMAL LOW (ref 13.0–17.0)
LYMPHS ABS: 0.5 10*3/uL — AB (ref 0.7–4.0)
LYMPHS ABS: 0.6 10*3/uL — AB (ref 0.7–4.0)
LYMPHS PCT: 27 %
Lymphocytes Relative: 22 %
MCH: 37.9 pg — ABNORMAL HIGH (ref 26.0–34.0)
MCH: 38.1 pg — ABNORMAL HIGH (ref 26.0–34.0)
MCHC: 33 g/dL (ref 30.0–36.0)
MCHC: 32.5 g/dL (ref 30.0–36.0)
MCV: 115.1 fL — AB (ref 78.0–100.0)
MCV: 117.3 fL — ABNORMAL HIGH (ref 78.0–100.0)
MONO ABS: 0.5 10*3/uL (ref 0.1–1.0)
MONOS PCT: 16 %
Monocytes Absolute: 0.4 10*3/uL (ref 0.1–1.0)
Monocytes Relative: 21 %
NEUTROS PCT: 47 %
Neutro Abs: 1.4 10*3/uL — ABNORMAL LOW (ref 1.7–7.7)
Neutro Abs: 1 10*3/uL — ABNORMAL LOW (ref 1.7–7.7)
Neutrophils Relative %: 59 %
Platelets: 113 10*3/uL — ABNORMAL LOW (ref 150–400)
Platelets: 87 10*3/uL — ABNORMAL LOW (ref 150–400)
RBC: 2.32 MIL/uL — ABNORMAL LOW (ref 4.22–5.81)
RBC: 2.26 MIL/uL — AB (ref 4.22–5.81)
RDW: 21.3 % — ABNORMAL HIGH (ref 11.5–15.5)
RDW: 21.2 % — AB (ref 11.5–15.5)
WBC: 2.3 10*3/uL — ABNORMAL LOW (ref 4.0–10.5)
WBC: 2.2 10*3/uL — AB (ref 4.0–10.5)

## 2018-01-27 LAB — TROPONIN I

## 2018-01-27 LAB — PROCALCITONIN: PROCALCITONIN: 0.5 ng/mL

## 2018-01-27 LAB — LACTIC ACID, PLASMA
LACTIC ACID, VENOUS: 2.5 mmol/L — AB (ref 0.5–1.9)
Lactic Acid, Venous: 2.8 mmol/L (ref 0.5–1.9)

## 2018-01-27 LAB — I-STAT CG4 LACTIC ACID, ED: LACTIC ACID, VENOUS: 2.37 mmol/L — AB (ref 0.5–1.9)

## 2018-01-27 LAB — CBC
HEMATOCRIT: 24.8 % — AB (ref 39.0–52.0)
Hemoglobin: 8.2 g/dL — ABNORMAL LOW (ref 13.0–17.0)
MCH: 37.8 pg — AB (ref 26.0–34.0)
MCHC: 33.1 g/dL (ref 30.0–36.0)
MCV: 114.3 fL — AB (ref 78.0–100.0)
Platelets: 91 10*3/uL — ABNORMAL LOW (ref 150–400)
RBC: 2.17 MIL/uL — ABNORMAL LOW (ref 4.22–5.81)
RDW: 21.2 % — AB (ref 11.5–15.5)
WBC: 2 10*3/uL — ABNORMAL LOW (ref 4.0–10.5)

## 2018-01-27 LAB — CK: CK TOTAL: 65 U/L (ref 49–397)

## 2018-01-27 LAB — LACTATE DEHYDROGENASE: LDH: 251 U/L — ABNORMAL HIGH (ref 98–192)

## 2018-01-27 LAB — ECHOCARDIOGRAM COMPLETE
HEIGHTINCHES: 71 in
Weight: 3668.45 oz

## 2018-01-27 LAB — PATHOLOGIST SMEAR REVIEW

## 2018-01-27 LAB — ABO/RH: ABO/RH(D): A POS

## 2018-01-27 MED ORDER — NADOLOL 20 MG PO TABS
20.0000 mg | ORAL_TABLET | Freq: Every day | ORAL | Status: DC
  Administered 2018-01-27 – 2018-01-30 (×4): 20 mg via ORAL
  Filled 2018-01-27 (×5): qty 1

## 2018-01-27 MED ORDER — ENOXAPARIN SODIUM 40 MG/0.4ML ~~LOC~~ SOLN: 40.0000 mg | SUBCUTANEOUS | Status: DC

## 2018-01-27 MED ORDER — LOSARTAN POTASSIUM 25 MG PO TABS
25.0000 mg | ORAL_TABLET | Freq: Every day | ORAL | Status: DC
  Administered 2018-01-27 – 2018-01-31 (×5): 25 mg via ORAL
  Filled 2018-01-27 (×5): qty 1

## 2018-01-27 MED ORDER — ACETAMINOPHEN 325 MG PO TABS
650.0000 mg | ORAL_TABLET | Freq: Four times a day (QID) | ORAL | Status: DC | PRN
  Administered 2018-01-27 – 2018-01-28 (×3): 650 mg via ORAL
  Filled 2018-01-27 (×3): qty 2

## 2018-01-27 MED ORDER — ATORVASTATIN CALCIUM 20 MG PO TABS
20.0000 mg | ORAL_TABLET | Freq: Every day | ORAL | Status: DC
Start: 2018-01-27 — End: 2018-01-30
  Administered 2018-01-27 – 2018-01-30 (×4): 20 mg via ORAL
  Filled 2018-01-27 (×4): qty 1

## 2018-01-27 MED ORDER — SODIUM CHLORIDE 0.9 % IV SOLN
1.0000 g | Freq: Three times a day (TID) | INTRAVENOUS | Status: DC
  Administered 2018-01-27 – 2018-01-28 (×5): 1 g via INTRAVENOUS
  Filled 2018-01-27 (×6): qty 1

## 2018-01-27 MED ORDER — ASPIRIN EC 81 MG PO TBEC
81.0000 mg | DELAYED_RELEASE_TABLET | Freq: Every day | ORAL | Status: DC
  Administered 2018-01-27: 81 mg via ORAL
  Filled 2018-01-27: qty 1

## 2018-01-27 MED ORDER — ACETAMINOPHEN 650 MG RE SUPP: 650.0000 mg | Freq: Four times a day (QID) | RECTAL | Status: DC | PRN

## 2018-01-27 MED ORDER — OXYBUTYNIN CHLORIDE ER 5 MG PO TB24
5.0000 mg | ORAL_TABLET | Freq: Every day | ORAL | Status: DC
  Administered 2018-01-27 – 2018-02-02 (×8): 5 mg via ORAL
  Filled 2018-01-27 (×8): qty 1

## 2018-01-27 MED ORDER — DILTIAZEM HCL ER COATED BEADS 120 MG PO CP24
120.0000 mg | ORAL_CAPSULE | Freq: Every day | ORAL | Status: DC
  Administered 2018-01-27 – 2018-02-03 (×8): 120 mg via ORAL
  Filled 2018-01-27 (×8): qty 1

## 2018-01-27 MED ORDER — FAMOTIDINE 20 MG PO TABS
20.0000 mg | ORAL_TABLET | Freq: Two times a day (BID) | ORAL | Status: DC
  Administered 2018-01-27 (×2): 20 mg via ORAL
  Filled 2018-01-27 (×2): qty 1

## 2018-01-27 MED ORDER — SODIUM CHLORIDE 0.9 % IV SOLN
INTRAVENOUS | Status: DC
  Administered 2018-01-27: 1000 mL via INTRAVENOUS
  Administered 2018-01-27 – 2018-01-29 (×4): via INTRAVENOUS
  Administered 2018-01-29: 1000 mL via INTRAVENOUS
  Administered 2018-01-30 (×2): via INTRAVENOUS

## 2018-01-27 MED ORDER — PANTOPRAZOLE SODIUM 40 MG PO TBEC
40.0000 mg | DELAYED_RELEASE_TABLET | Freq: Every day | ORAL | Status: DC
  Administered 2018-01-27: 40 mg via ORAL
  Filled 2018-01-27: qty 1

## 2018-01-27 MED ORDER — LINEZOLID 600 MG/300ML IV SOLN
600.0000 mg | Freq: Once | INTRAVENOUS | Status: AC
  Administered 2018-01-27: 600 mg via INTRAVENOUS
  Filled 2018-01-27: qty 300

## 2018-01-27 MED ORDER — VITAMIN D 1000 UNITS PO TABS
1000.0000 [IU] | ORAL_TABLET | Freq: Every day | ORAL | Status: DC
  Administered 2018-01-27 – 2018-02-03 (×8): 1000 [IU] via ORAL
  Filled 2018-01-27 (×10): qty 1

## 2018-01-27 MED ORDER — PANTOPRAZOLE SODIUM 40 MG PO TBEC
40.0000 mg | DELAYED_RELEASE_TABLET | Freq: Two times a day (BID) | ORAL | Status: DC
  Administered 2018-01-27 – 2018-02-02 (×13): 40 mg via ORAL
  Filled 2018-01-27 (×13): qty 1

## 2018-01-27 MED ORDER — INSULIN GLARGINE 100 UNIT/ML ~~LOC~~ SOLN
10.0000 [IU] | Freq: Every day | SUBCUTANEOUS | Status: DC
Start: 2018-01-27 — End: 2018-02-02
  Administered 2018-01-27 – 2018-02-01 (×7): 10 [IU] via SUBCUTANEOUS
  Filled 2018-01-27 (×8): qty 0.1

## 2018-01-27 MED ORDER — DILTIAZEM HCL ER 120 MG PO CP24: 120.0000 mg | ORAL_CAPSULE | Freq: Every day | ORAL | Status: DC

## 2018-01-27 MED ORDER — SODIUM CHLORIDE 0.9 % IV SOLN
INTRAVENOUS | Status: DC
  Administered 2018-01-27: 01:00:00 via INTRAVENOUS

## 2018-01-27 NOTE — Progress Notes (Signed)
[facility] TEAM 1 - Stepdown/ICU TEAM  Bernhard Koskinen  TIR:[id number] DOB: 25-Jan-1946 DOA: 01/26/2018 PCP: [name], MD    Brief Narrative:  72yo male with a history of myasthenia gravis (IVIG every month - due next week) who was discharged from [facility] 2 days prior after tx for an acute GI bleed w/ pancytopenia.  He was instructed to discontinue Imuran.  He presented to the ER with weakness and difficulty ambulating.    In the ER patient was found to be tachycardic and febrile to 102 F.  CXR was unremarkable. UA was unremarkable.  Patient was noted to have an ulceration on his right foot which was red in color.  Labs revealed creatinine at baseline with elevated lactate level and pancytopenia.   Significant Events: 7/2 admit   Subjective: Pt was seen for a f/u visit.    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 rate 20, height 5' 11"  (1.803 m), weight 104 kg (229 lb 4.5 oz), SpO2 97 %.  Intake/Output Summary (Last 24 hours) at 01/27/2018 1219 Last data  filed at 01/27/2018 1215 Gross per 24 hour  Intake 1636.36 ml  Output 200 ml  Net 1436.36 ml   Filed Weights   01/26/18 2014 01/27/18 0500  Weight: 95.3 kg (210 lb) 104 kg (229 lb 4.5 oz)    Examination: Pt was seen for a f/u visit.    CBC: Recent Labs  Lab 01/26/18 2033 01/27/18 0028 01/27/18 0332  WBC 2.3* 2.0* 2.2*  NEUTROABS 1.4*  --  1.0*  HGB 8.8* 8.2* 8.6*  HCT 26.7* 24.8* 26.5*  MCV 115.1* 114.3* 117.3*  PLT 113* 91* 87*   Basic Metabolic Panel: Recent Labs  Lab 01/26/18 2033 01/27/18 0028 01/27/18 0332  NA 139  --  138  K 3.4*  --  3.6  CL 105  --  108  CO2 24  --  20*  GLUCOSE 196*  --  188*  BUN 21  --  23  CREATININE 1.54* 1.46* 1.43*  CALCIUM 7.9*  --  7.4*   GFR: Estimated Creatinine Clearance: 58.2 mL/min (A) (by C-G formula based on SCr of 1.43 mg/dL (H)).  Liver Function Tests: Recent Labs  Lab 01/26/18 2033 01/27/18 0332  AST 65* 58*  ALT 61* 53*  ALKPHOS 99 84  BILITOT 2.2* 2.2*  PROT 6.8 6.1*  ALBUMIN 1.9* 1.8*    Coagulation Profile: Recent Labs  Lab 01/26/18 2033  INR 1.25    Cardiac Enzymes: Recent Labs  Lab 01/27/18 0402 01/27/18 0957  CKTOTAL 65  --   TROPONINI <0.03 <0.03    HbA1C:  Hgb A1c MFr Bld  Date/Time Value Ref Range Status  07/03/2017 12:53 PM 7.7 (H) 4.8 - 5.6 % Final    Comment:             Prediabetes: 5.7 - 6.4          Diabetes: >6.4          Glycemic control for adults with diabetes: <7.0   04/01/2017 04:41 AM 7.4 (H) 4.8 - 5.6 % Final    Comment:    (NOTE) Pre diabetes:          5.7%-6.4% Diabetes:              >6.4% Glycemic control for   <7.0% adults with diabetes     Recent Results (from the past 240 hour(s))  MRSA PCR Screening     Status: None   Collection Time: 01/27/18  8:52 AM  Result Value Ref Range Status   MRSA by PCR NEGATIVE NEGATIVE Final    Comment:        The GeneXpert MRSA Assay (FDA approved for NASAL specimens only), is one component of a comprehensive MRSA  colonization surveillance program. It is not intended to diagnose MRSA infection nor to guide or monitor treatment for MRSA infections. Performed at [facility], [place] [address], [place], [place] [address]      Scheduled Meds: . aspirin EC  81 mg Oral Daily  . atorvastatin  20 mg Oral Daily  . diltiazem  120 mg Oral Daily  . famotidine  20 mg Oral BID  . insulin glargine  10 Units Subcutaneous QHS  . losartan  25 mg Oral Daily  . nadolol  20 mg Oral Daily  . oxybutynin  5 mg Oral QHS  . pantoprazole  40 mg Oral Daily   Continuous Infusions: . ceFEPime (MAXIPIME) IV 1 g (01/27/18 0510)     LOS: 1 day   Cherene Altes, MD [facility] Office  [phone] Pager - Text Page per Amion as per below:  On-Call/Text Page:      [email or web address]      password [email or web address]  If 7PM-7AM, please contact night-coverage [email or web address] Password [facility] 01/27/2018, 12:19 PM

## 2018-01-27 NOTE — Progress Notes (Signed)
Bilateral ABIs and TBIs completed. Right ABI 1.24 TBI 1.07 Left ABI 1.46 TBI 1.10 Doppler waveforms were normal bilaterally at rest. [facility] 01/27/2018 3:27 PM

## 2018-01-27 NOTE — Consult Note (Signed)
Neurology Consultation Reason for Consult: myasthenia gravis, generalized weakness Referring Physician: Hal Hope    History is obtained from: Patient   HPI: Bob Warren is a Hal Hope, diabetes, hypertension, hyperlipidemia presents to the emergency room with generalized weakness.  He was found to be febrile in the emergency room and 102F and tachycardic.  He was diagnosed to have pneumonia.  Patient was recently admitted in [facility] for pancytopenia due to Imuran and has subsequently discontinued it.  He is currently on monthly IVIG for myasthenia gravis.  He is not taking Mestinon due to diarrhea.  He has last seen Dr. Krista Blue in January 08, 2018 and received IVIG most recently on June 11.  Neurology was consulted for concern for myasthenia exacerbation.  Patient states that he is doing better since receiving IV fluids and no longer as tired.  He denies any shortness of breath, double vision.   ROS: A 14 point ROS was performed and is negative except as noted in the HPI.   Past Medical History:  Diagnosis Date  . Diabetes ([place])   . High cholesterol   . Hypertension   . Myasthenia gravis ([place])      Family History  Problem Relation Age of Onset  . Cancer Mother   . Other Father        Gunshot wound  . Diabetes Maternal Grandfather      Social History:  reports that he has quit smoking. He has never used smokeless tobacco. He reports that he does not drink alcohol or use drugs.   Exam: Current vital signs: BP 121/60   Pulse 81   Temp (!) 102.5 F (39.2 C) (Oral)   Resp 18   Ht 5' 11"  (1.803 m)   Wt 95.3 kg (210 lb)   SpO2 99%   BMI 29.29 kg/m  Vital signs in last 24 hours: Temp:  [99.4 F (37.4 C)-102.5 F (39.2 C)] 102.5 F (39.2 C) (07/02 0510) Pulse Rate:  [69-87] 81 (07/02 0444) Resp:  [13-24] 18 (07/02 0444) BP: (101-160)/(46-70) 121/60 (07/02 0444) SpO2:  [98 %-100 %] 99 % (07/02 0444) Weight:  [95.3 kg (210 lb)] 95.3 kg (210 lb) (07/01  2014)   Physical Exam  Constitutional: Appears well-developed and well-nourished.  Psych: Affect appropriate to situation Eyes: No scleral injection HENT: No OP obstrucion Head: Normocephalic.  Cardiovascular: Normal rate and regular rhythm.  Respiratory: Effort normal, non-labored breathing.  Single breath count greater than 20. GI: Soft.  No distension. There is no tenderness.  Skin: WDI  Neuro: Mental Status: Patient is awake, alert, oriented to person, place, month, year, and situation. Patient is able to give a clear and coherent history. No signs of aphasia or neglect Cranial Nerves: II: Visual Fields are full. Pupils are equal, round, and reactive to light.   III,IV, VI: EOMI without ptosis or diploplia.  V: Facial sensation is symmetric to temperature VII: Facial movement is symmetric.  VIII: hearing is intact to voice X: Uvula elevates symmetrically XI: Shoulder shrug is symmetric. XII: tongue is midline without atrophy or fasciculations.  Motor: Tone is normal. Bulk is normal. 5/5 strength was present in all four extremities.  Neck flexor strength was strong Sensory: Sensation is symmetric to light touch and temperature in the arms and legs. Deep Tendon Reflexes: 2+ and symmetric in the biceps and patellae.  Plantars: Toes are downgoing bilaterally.  Cerebellar: FNF and HKS are intact bilaterally      I have reviewed labs in epic and the results  pertinent to this consultation are:  I have reviewed the images obtained: No neuroimages   ASSESSMENT AND PLAN  72 year old male with myasthenia gravis presents with fever secondary to pneumonia and has been feeling fatigued and difficulty walking since last few days.  Myasthenia Gravis  Patient does not appear to be in crisis/exacerbation.  States he is feeling better after receiving IV fluids.  Patient to be reassessed tomorrow, if he still is feeling fatigued then we can arrange for earlier infusion. The  patient feels back to himself, then he can be discharged with IVIG as scheduled.     Karena Addison Aroor MD [facility] [phone]   If 7pm to 7am, please call on call as listed on AMION.

## 2018-01-27 NOTE — Progress Notes (Signed)
NIF/VC done after three attempts these are the average of the test.   VC: 1.4L  NIF: -37  Patient in no distress at this time, RCP will continue to follow.

## 2018-01-27 NOTE — Progress Notes (Signed)
CRITICAL VALUE ALERT  Critical Value:  Lactic acid 2.8  Date & Time Notied:  01/27/18 0411  Provider Notified: Glenis Smoker, MD  Orders Received/Actions taken: No new orders

## 2018-01-27 NOTE — H&P (Signed)
History and Physical    Bob Warren VEL:[id number] DOB: July 27, 1946 DOA: 01/26/2018  PCP: [name], MD  Patient coming from: Home.  Chief Complaint: Weakness.  HPI: Bob Warren is a 72 y.o. male with history of myasthenia gravis gets IVIG every month due for one next week was recently admitted and discharged 2 days ago at [facility] at the time patient was admitted for acute GI bleed and was found to have pancytopenia and was instructed to discontinue Imuran and has not been taking his Imuran since discharge presents to the ER with complaints of weakness and difficulty to ambulate because of weakness.  Denies any difficulty breathing or swallowing.  Had no visual symptoms.  Has not lost function of his extremities but has been generalized weakness.  Symptoms started yesterday morning.  Had gone to his PCP and had to go in on wheelchair.  After returning home patient felt more weak and decided to come to the ER at [facility].  ED Course: In the ER patient is found to be tachycardic febrile with temperatures around 102 F.  Chest x-ray was unremarkable UA is unremarkable.  Patient's right foot shows an ulceration on the dorsal aspect which is red in color.  No active discharge.  Patient states last week he developed a blister which eventually became discolored.  Patient has been developing peripheral edema last 1 week.  Which has been gradually worsening.  Labs revealed creatinine at baseline with elevated lactate level and pancytopenia.  Patient started on empiric antibiotics.  Since patient has weakness on-call neurologist has been consulted.  Review of Systems: As per HPI, rest all negative.   Past Medical History:  Diagnosis Date  . Diabetes ([place])   . High cholesterol   . Hypertension   . Myasthenia gravis [employer])     Past Surgical History:  Procedure Laterality Date  . COLONOSCOPY    . ESOPHAGOGASTRODUODENOSCOPY N/A 01/14/2018   Procedure:  ESOPHAGOGASTRODUODENOSCOPY (EGD);  Surgeon: Virgel Manifold, MD;  Location: [facility] ENDOSCOPY;  Service: Endoscopy;  Laterality: N/A;  . SKIN CANCER EXCISION Right 2016   Arm     reports that he has quit smoking. He has never used smokeless tobacco. He reports that he does not drink alcohol or use drugs.  Allergies  Allergen Reactions  . Imuran [Azathioprine] Other (See Comments)    Abnormal LIver functional   . Lisinopril Cough    Family History  Problem Relation Age of Onset  . Cancer Mother   . Other Father        Gunshot wound  . Diabetes Maternal Grandfather     Prior to Admission medications   Medication Sig Start Date End Date Taking? Authorizing Provider  aspirin EC 81 MG tablet Take 81 mg by mouth daily.   Yes [provider]  atorvastatin (LIPITOR) 20 MG tablet Take 20 mg by mouth daily.    Yes [provider]  Cholecalciferol (VITAMIN D3) 1000 units CAPS Take 1,000 Units by mouth daily.    Yes [provider]  diltiazem (DILACOR XR) 120 MG 24 hr capsule Take 120 mg by mouth daily.   Yes [provider]  furosemide (LASIX) 40 MG tablet Take 40 mg by mouth 2 (two) times daily. 01/23/18  Yes [provider]  insulin glargine (LANTUS) 100 UNIT/ML injection Inject 20 Units into the skin at bedtime.   Yes [provider]  losartan (COZAAR) 25 MG tablet Take 25 mg by mouth daily.  05/22/15  Yes [provider]  metFORMIN (GLUCOPHAGE) 850 MG tablet Take 850 mg by mouth 2 (two) times daily with a meal.    Yes [provider]  nadolol (CORGARD) 20 MG tablet Take 1 tablet (20 mg total) by mouth daily. 01/15/18 01/15/19 Yes Dustin Flock, MD  oxybutynin (DITROPAN-XL) 5 MG 24 hr tablet Take 1 tablet (5 mg total) by mouth at bedtime. 05/07/17  Yes Marcial Pacas, MD  pantoprazole (PROTONIX) 40 MG tablet Take 1 tablet (40 mg total) by mouth daily. 01/15/18 01/15/19 Yes Dustin Flock, MD  PRESCRIPTION MEDICATION Inject  into the vein every 30 (thirty) days. Infusion at [facility] for myasthenia gravis   Yes [provider]  ranitidine (ZANTAC) 150 MG tablet Take 150 mg by mouth 2 (two) times daily.   Yes [provider]  silver sulfADIAZINE (SILVADENE) 1 % cream Apply 1 application topically 2 (two) times daily.   Yes [provider]  spironolactone (ALDACTONE) 25 MG tablet Take 25 mg by mouth 2 (two) times daily as needed (fluid/swelling).  01/23/18  Yes [provider]  ondansetron (ZOFRAN ODT) 4 MG disintegrating tablet Take 1 tablet (4 mg total) by mouth every 8 (eight) hours as needed. Patient not taking: Reported on 01/13/2018 05/07/17   Marcial Pacas, MD    Physical Exam: Vitals:   01/26/18 2249 01/26/18 2300 01/26/18 2315 01/26/18 2350  BP: (!) 104/58 (!) 102/54 (!) 101/53   Pulse: 73 72 78   Resp: 18 18 19    Temp:    (S) 99.4 F (37.4 C)  TempSrc:    Oral  SpO2: 99% 98% 100%   Weight:      Height:          Constitutional: Moderately built and nourished. Vitals:   01/26/18 2249 01/26/18 2300 01/26/18 2315 01/26/18 2350  BP: (!) 104/58 (!) 102/54 (!) 101/53   Pulse: 73 72 78   Resp: 18 18 19    Temp:    (S) 99.4 F (37.4 C)  TempSrc:    Oral  SpO2: 99% 98% 100%   Weight:      Height:       Eyes: Anicteric no pallor. ENMT: No discharge from the ears eyes nose or mouth. Neck: No mass felt.  No neck rigidity. Respiratory: No rhonchi or crepitations. Cardiovascular: S1-S2 heard no murmurs appreciated. Abdomen: Soft nontender bowel sounds present.  Musculoskeletal: Bilateral lower examinee edema. Skin: Right foot on the dorsal aspect has an erythematous ulceration with no active discharge. Neurologic: Alert awake oriented to time place and person.  Moves all extremities. Psychiatric: Appears normal.  Normal affect.   Labs on Admission: I have personally reviewed following labs and imaging studies  CBC: Recent Labs  Lab 01/26/18 2033    WBC 2.3*  NEUTROABS 1.4*  HGB 8.8*  HCT 26.7*  MCV 115.1*  PLT 324*   Basic Metabolic Panel: Recent Labs  Lab 01/26/18 2033  NA 139  K 3.4*  CL 105  CO2 24  GLUCOSE 196*  BUN 21  CREATININE 1.54*  CALCIUM 7.9*   GFR: Estimated Creatinine Clearance: 51.8 mL/min (A) (by C-G formula based on SCr of 1.54 mg/dL (H)). Liver Function Tests: Recent Labs  Lab 01/26/18 2033  AST 65*  ALT 61*  ALKPHOS 99  BILITOT 2.2*  PROT 6.8  ALBUMIN 1.9*   No results for input(s): LIPASE, AMYLASE in the last 168 hours. No results for input(s): AMMONIA in the last 168 hours. Coagulation Profile: Recent  Labs  Lab 01/26/18 2033  INR 1.25   Cardiac Enzymes: No results for input(s): CKTOTAL, CKMB, CKMBINDEX, TROPONINI in the last 168 hours. BNP (last 3 results) No results for input(s): PROBNP in the last 8760 hours. HbA1C: No results for input(s): HGBA1C in the last 72 hours. CBG: No results for input(s): GLUCAP in the last 168 hours. Lipid Profile: No results for input(s): CHOL, HDL, LDLCALC, TRIG, CHOLHDL, LDLDIRECT in the last 72 hours. Thyroid Function Tests: No results for input(s): TSH, T4TOTAL, FREET4, T3FREE, THYROIDAB in the last 72 hours. Anemia Panel: No results for input(s): VITAMINB12, FOLATE, FERRITIN, TIBC, IRON, RETICCTPCT in the last 72 hours. Urine analysis:    Component Value Date/Time   COLORURINE AMBER (A) 01/26/2018 2043   APPEARANCEUR CLEAR 01/26/2018 2043   LABSPEC 1.023 01/26/2018 2043   PHURINE 5.0 01/26/2018 2043   GLUCOSEU 50 (A) 01/26/2018 2043   HGBUR NEGATIVE 01/26/2018 2043   BILIRUBINUR SMALL (A) 01/26/2018 2043   [place] NEGATIVE 01/26/2018 2043   PROTEINUR 30 (A) 01/26/2018 2043   NITRITE NEGATIVE 01/26/2018 2043   LEUKOCYTESUR NEGATIVE 01/26/2018 2043   Sepsis Labs: @LABRCNTIP (procalcitonin:4,lacticidven:4) )No results found for this or any previous visit (from the past 240 hour(s)).   Radiological Exams on Admission: Dg Chest 2  View  Result Date: 01/26/2018 CLINICAL DATA:  Sepsis EXAM: CHEST - 2 VIEW COMPARISON:  05/09/2017 FINDINGS: The heart size and mediastinal contours are within normal limits. Both lungs are clear. Mild aortic atherosclerosis. The visualized skeletal structures are unremarkable. IMPRESSION: No active cardiopulmonary disease. Electronically Signed   By: Donavan Foil M.D.   On: 01/26/2018 21:33    EKG: Independently reviewed.  Normal sinus rhythm.  Assessment/Plan Principal Problem:   Sepsis ([place]) Active Problems:   Hypertension   Myasthenia gravis with acute exacerbation (HCC)   Drug-induced pancytopenia ([place])   Diabetes mellitus type 2 in obese ([place])    1. Sepsis likely from right foot cellulitis -patient placed on cefepime and Zyvox.  Patient has received only 1 dose of Zyvox and will need infectious disease approval for further doses.  Vancomycin was avoided due to history of myasthenia gravis.  Follow cultures.  Follow procalcitonin levels and lactate levels. 2. Generalized weakness with history of myasthenia gravis concerning for exacerbation.  I have discussed with on-call neurologist Dr. Lorraine Lax will be seeing patient in consult.  Patient's Imuran was recently discontinued due to pancytopenia.  Will check NIF and vital capacity every 6 only.  Further recommendations per neurologist. 3. Pancytopenia -was attributed to Imuran.  Will check LDH and smear review and may need to get hematologist consult while inpatient.  Patient is originally scheduled to see oncologist tomorrow. 4. Chronic kidney disease stage II creatinine appears to be at baseline. 5. Cirrhosis of the liver with elevated LFTs appears to be improving from recent past 2 weeks ago.  Holding patient's Lasix and spironolactone for now but will need to resume soon after septic which improves.  Patient is also on nadolol. 6. Diabetes mellitus type 2 -we will decrease insulin from 20 units to 10 units.  Closely follow with  CBGs. 7. Hypertension on Cardizem and Cozaar and nadolol.  Which will continue. 8. Peripheral edema secondary to third spacing likely from cirrhosis.  Albumin is significantly low.  For now holding Lasix and spironolactone with need to be restarted soon.   DVT prophylaxis: Lovenox but will change to SCDs if platelets drop. Code Status: DNR. Family Communication: Patient's wife. Disposition Plan: Home. Consults called: Neurology.  Admission status: Inpatient.   Rise Patience MD [facility] Pager [phone].  If 7PM-7AM, please contact night-coverage [email or web address] Password [email or web address]  01/27/2018, 12:06 AM

## 2018-01-27 NOTE — Progress Notes (Signed)
  Echocardiogram 2D Echocardiogram has been performed.  Jennette Dubin 01/27/2018, 2:50 PM

## 2018-01-27 NOTE — Progress Notes (Signed)
Pharmacy Antibiotic Note  Bob Warren is a 72 y.o. male admitted on 01/26/2018 with sepsis.  Pharmacy has been consulted for cefepime dosing.  Plan: Cefepime 1gm IV q8 hours F/u renal function, cultures and clinical course  Height: 5\' 11"  (180.3 cm) Weight: 210 lb (95.3 kg) IBW/kg (Calculated) : 75.3  Temp (24hrs), Avg:101 F (38.3 C), Min:99.4 F (37.4 C), Max:102.5 F (39.2 C)  Recent Labs  Lab 01/26/18 2033 01/26/18 2058 01/27/18 0001  WBC 2.3*  --   --   CREATININE 1.54*  --   --   LATICACIDVEN  --  3.04* 2.37*    Estimated Creatinine Clearance: 51.8 mL/min (A) (by C-G formula based on SCr of 1.54 mg/dL (H)).    Allergies  Allergen Reactions  . Imuran [Azathioprine] Other (See Comments)    Abnormal LIver functional   . Lisinopril Cough     Thank you for allowing pharmacy to be a part of this patient's care.  Excell Seltzer Poteet 01/27/2018 12:16 AM

## 2018-01-27 NOTE — Progress Notes (Signed)
RT NOTE: NIF: -20  VC 1.15L. Pt with good effort.

## 2018-01-27 NOTE — Progress Notes (Signed)
RT Note: NIF -20   VC 1.16L with good patient effort.

## 2018-01-28 DIAGNOSIS — I4891 Unspecified atrial fibrillation: Secondary | ICD-10-CM

## 2018-01-28 DIAGNOSIS — I10 Essential (primary) hypertension: Secondary | ICD-10-CM

## 2018-01-28 DIAGNOSIS — R188 Other ascites: Secondary | ICD-10-CM

## 2018-01-28 DIAGNOSIS — N182 Chronic kidney disease, stage 2 (mild): Secondary | ICD-10-CM

## 2018-01-28 DIAGNOSIS — N170 Acute kidney failure with tubular necrosis: Secondary | ICD-10-CM

## 2018-01-28 DIAGNOSIS — R609 Edema, unspecified: Secondary | ICD-10-CM

## 2018-01-28 DIAGNOSIS — K7469 Other cirrhosis of liver: Secondary | ICD-10-CM

## 2018-01-28 DIAGNOSIS — I5031 Acute diastolic (congestive) heart failure: Secondary | ICD-10-CM

## 2018-01-28 LAB — CBC WITH DIFFERENTIAL/PLATELET
Band Neutrophils: 8 %
Basophils Absolute: 0.1 10*3/uL (ref 0.0–0.1)
Basophils Relative: 4 %
Blasts: 0 %
EOS ABS: 0.1 10*3/uL (ref 0.0–0.7)
Eosinophils Relative: 4 %
HCT: 23.6 % — ABNORMAL LOW (ref 39.0–52.0)
Hemoglobin: 7.8 g/dL — ABNORMAL LOW (ref 13.0–17.0)
Lymphocytes Relative: 32 %
Lymphs Abs: 0.6 10*3/uL — ABNORMAL LOW (ref 0.7–4.0)
MCH: 38 pg — AB (ref 26.0–34.0)
MCHC: 33.1 g/dL (ref 30.0–36.0)
MCV: 115.1 fL — AB (ref 78.0–100.0)
METAMYELOCYTES PCT: 0 %
MONO ABS: 0.2 10*3/uL (ref 0.1–1.0)
Monocytes Relative: 12 %
Myelocytes: 0 %
NEUTROS ABS: 0.8 10*3/uL — AB (ref 1.7–7.7)
NRBC: 0 /100{WBCs}
Neutrophils Relative %: 40 %
OTHER: 0 %
PLATELETS: 88 10*3/uL — AB (ref 150–400)
Promyelocytes Relative: 0 %
RBC: 2.05 MIL/uL — ABNORMAL LOW (ref 4.22–5.81)
RDW: 20.4 % — ABNORMAL HIGH (ref 11.5–15.5)
SMEAR REVIEW: DECREASED
WBC: 1.8 10*3/uL — AB (ref 4.0–10.5)

## 2018-01-28 LAB — COMPREHENSIVE METABOLIC PANEL
ALBUMIN: 1.6 g/dL — AB (ref 3.5–5.0)
ALK PHOS: 78 U/L (ref 38–126)
ALT: 43 U/L (ref 0–44)
AST: 53 U/L — AB (ref 15–41)
Anion gap: 7 (ref 5–15)
BUN: 21 mg/dL (ref 8–23)
CHLORIDE: 110 mmol/L (ref 98–111)
CO2: 23 mmol/L (ref 22–32)
CREATININE: 1.45 mg/dL — AB (ref 0.61–1.24)
Calcium: 7.2 mg/dL — ABNORMAL LOW (ref 8.9–10.3)
GFR calc Af Amer: 54 mL/min — ABNORMAL LOW (ref >60)
GFR calc non Af Amer: 47 mL/min — ABNORMAL LOW (ref >60)
GLUCOSE: 142 mg/dL — AB (ref 70–99)
Potassium: 3.5 mmol/L (ref 3.5–5.1)
SODIUM: 140 mmol/L (ref 135–145)
Total Bilirubin: 1.9 mg/dL — ABNORMAL HIGH (ref 0.3–1.2)
Total Protein: 5.8 g/dL — ABNORMAL LOW (ref 6.5–8.1)

## 2018-01-28 LAB — GLUCOSE, CAPILLARY: GLUCOSE-CAPILLARY: 184 mg/dL — AB (ref 70–99)

## 2018-01-28 LAB — URINE CULTURE: Culture: <10000 — AB

## 2018-01-28 LAB — RETICULOCYTES
RBC.: 2.05 MIL/uL — AB (ref 4.22–5.81)
RETIC CT PCT: 4.4 % — AB (ref 0.4–3.1)
Retic Count, Absolute: 90.2 10*3/uL (ref 19.0–186.0)

## 2018-01-28 LAB — LACTIC ACID, PLASMA
Lactic Acid, Venous: 2.5 mmol/L (ref 0.5–1.9)
Lactic Acid, Venous: 2 mmol/L (ref 0.5–1.9)

## 2018-01-28 LAB — VITAMIN B12: VITAMIN B 12: 444 pg/mL (ref 180–914)

## 2018-01-28 LAB — IRON AND TIBC
Iron: 24 ug/dL — ABNORMAL LOW (ref 45–182)
SATURATION RATIOS: 12 % — AB (ref 17.9–39.5)
TIBC: 209 ug/dL — AB (ref 250–450)
UIBC: 185 ug/dL

## 2018-01-28 LAB — FERRITIN: FERRITIN: 138 ng/mL (ref 24–336)

## 2018-01-28 LAB — FOLATE: Folate: 19.7 ng/mL (ref >5.9)

## 2018-01-28 MED ORDER — ALBUMIN HUMAN 25 % IV SOLN
50.0000 g | Freq: Once | INTRAVENOUS | Status: AC
  Administered 2018-01-28: 50 g via INTRAVENOUS
  Filled 2018-01-28: qty 50

## 2018-01-28 MED ORDER — FUROSEMIDE 10 MG/ML IJ SOLN
40.0000 mg | Freq: Two times a day (BID) | INTRAMUSCULAR | Status: DC
  Administered 2018-01-28 – 2018-01-29 (×2): 40 mg via INTRAVENOUS
  Filled 2018-01-28 (×2): qty 4

## 2018-01-28 MED ORDER — CEFEPIME HCL 1 G IJ SOLR
1.0000 g | Freq: Two times a day (BID) | INTRAMUSCULAR | Status: AC
  Administered 2018-01-28 – 2018-02-01 (×9): 1 g via INTRAVENOUS
  Filled 2018-01-28 (×9): qty 1

## 2018-01-28 NOTE — Progress Notes (Signed)
RN paged NP because pt was having difficulty finding his words this am which was a change from earlier. NP to bedside.  S: says since he had myasthenia gravis, sometimes it takes him a minute to think of a word. Says he is "slower" than he used to be.  O: well appearing WM in NAD. He is alert and oriented. Speech is fluent. Able to say "no ifs, ands, or buts". Able to name objects immediately. Neuro exam is without focal deficit.  A/P: ? Word finding issue r/o stroke-do not see any signs of stroke on exam. I think this is just his MG, but not an exacerbation. He was seen by neuro yesterday. He gets IVIG weekly. Neuro said if he still feels fatigued today, we can give him IVIG before he goes home.  KJKG, NP [facility]  Total critical care time: 30 minutes Critical care time was exclusive of separately billable procedures and treating other patients. Critical care was necessary to treat or prevent imminent or life-threatening deterioration. Critical care was time spent personally by me on the following activities: development of treatment plan with patient and/or surrogate as well as nursing, discussions with consultants, evaluation of patient's response to treatment, examination of patient, obtaining history from patient or surrogate, ordering and performing treatments and interventions, ordering and review of laboratory studies, ordering and review of radiographic studies, pulse oximetry and re-evaluation of patient's condition.

## 2018-01-28 NOTE — Progress Notes (Signed)
CRITICAL VALUE ALERT  Critical Value:  latic 2.0  Date & Time Notied:  10:30pm, 01/28/2018  Provider Notified: Baltazar Najjar, NP  Orders Received/Actions taken: awaiting any further orders

## 2018-01-28 NOTE — Progress Notes (Signed)
PROGRESS NOTE    Bob Warren  ZYS:[id number] DOB: 04-23-1946 DOA: 01/26/2018 PCP: [name], MD   Brief Narrative:  72yo WM PMHx patient seen at [facility] Diabetes Type 2 Uncontrolled with complication,Diabetic neuropathy, HLD, HTN,Paroxysmal atrial fibrillation, Acute Diastolic CHF, Liver Cirrhosis, GI bleed, CKD stage II, myasthenia Gravis (IVIG every month - due next week), Drug-induced pancytopenia    Discharged from [facility] 2 days prior after tx for an acute GI bleed w/ pancytopenia.  He was instructed to discontinue Imuran.  He presented to the ER with weakness and difficulty ambulating.     In the ER patient was found to be tachycardic and febrile to 102 F.  CXR was unremarkable. UA was unremarkable.  Patient was noted to have an ulceration on his right foot which was red in color.  Labs revealed creatinine at baseline with elevated lactate level and pancytopenia.     Subjective: 7/3/ AO x4, negative CP, positive mild S OB, positive mild abdominal pain secondary to increased girth, positive RLE pain.   Assessment & Plan:   Principal Problem:   Sepsis ([place]) Active Problems:   Hypertension   Myasthenia gravis with acute exacerbation (HCC)   Drug-induced pancytopenia (HCC)   Diabetes mellitus type 2 in obese (HCC)  Sepsis/RIGHT foot cellulitis - Vancomycin avoided secondary to history of myasthenia gravis - Continue current antibiotic regimen -Wound care nurse consulted.  Patient is a diabetic  Generalized Weakness/Myasthenia Gravis -Seen by [facility] -Per Dr. Thereasa Solo note spoke with neurology they do not feel MG exacerbation -Imuran recently discontinued due to pancytopenia  Other liver cirrhosis with elevated LFT -Previous abdominal ultrasound showed cirrhosis with small ascites: Ultrasound abdomen 01/14/2018 - Previous admission acute hepatitis negative (01/13/2018) -Cause? -Ascites unknown if patient has had  previous paracentesis.  Acute diastolic CHF (unknown based weight) -Strict in and out -Daily weight - Patient grossly fluid overloaded, secondary to his liver failure more so than CHF. -Transfuse for hemoglobin<8 - Albumin 50 g x 1 -Diltiazem 120 mg daily - Lasix 40 mg IV BID: Administer after albumin - Losartan 25 mg daily -Nadolol 20 mg daily  Atrial fibrillation unspecified - Currently NSR - Not on anticoagulation at home  Acute on CKD stage II (baseline Cr 1.1 at [facility] 01/23/2018) - Renal syndrome? -Hold all nephrotoxic medication -  See CHF   Acute delirium? -Patient answers all questions appropriately.  Interacting appropriately with family members.   Pancytopenia - Attributed to Imuran   Ascites/peripheral edema -Secondary to liver cirrhosis      DVT prophylaxis: None Code Status: DNR Family Communication: Wife at bedside for discussion plan of care Disposition Plan: TBD    Consultants:  WOC pending      Procedures/Significant Events:     I have personally reviewed and interpreted all radiology studies and my findings are as above.  VENTILATOR SETTINGS:    Cultures   Antimicrobials: Anti-infectives (From admission, onward)   Start     Stop   01/28/18 2200  ceFEPIme (MAXIPIME) 1 g in sodium chloride 0.9 % 100 mL IVPB         01/27/18 0030  ceFEPIme (MAXIPIME) 1 g in sodium chloride 0.9 % 100 mL IVPB  Status:  Discontinued     01/28/18 0904   01/27/18 0015  linezolid (ZYVOX) IVPB 600 mg     01/27/18 0201   01/26/18 2145  ceFAZolin (ANCEF) IVPB 1 g/50 mL premix     01/26/18 2226  Devices    LINES / TUBES:       Continuous Infusions: . sodium chloride 100 mL/hr at 01/27/18 2344  . ceFEPime (MAXIPIME) IV 1 g (01/28/18 0518)     Objective: Vitals:   01/28/18 0514 01/28/18 0515 01/28/18 0631 01/28/18 0715  BP:  136/77    Pulse:  71    Resp:  (!) 21    Temp: (!) 100.8 F (38.2 C)  99.1 F (37.3 C) 99 F (37.2  C)  TempSrc: Oral  Oral Oral  SpO2:  98%    Weight:      Height:        Intake/Output Summary (Last 24 hours) at 01/28/2018 0850 Last data filed at 01/27/2018 1825 Gross per 24 hour  Intake 1546.73 ml  Output 300 ml  Net 1246.73 ml   Filed Weights   01/26/18 2014 01/27/18 0500 01/28/18 0500  Weight: 210 lb (95.3 kg) 229 lb 4.5 oz (104 kg) 231 lb 11.3 oz (105.1 kg)    Examination:  General: A/O x4, positive mild acute respiratory distress Neck:  Negative scars, masses, torticollis, lymphadenopathy, JVD Lungs: Clear to auscultation bilaterally without wheezes or crackles Cardiovascular: Regular rate and rhythm without murmur gallop or rub normal S1 and S2 Abdomen: negative abdominal pain, positive distention , positive soft, bowel sounds, no rebound, no ascites, no appreciable mass, positive ascites Extremities: No significant cyanosis, clubbing.,  Positive bilateral lower extremity edema 2-3+ to the hips Skin: Positive RIGHT foot ulcer see picture below Psychiatric:  Negative depression, negative anxiety, negative fatigue, negative mania  Central nervous system:  Cranial nerves II through XII intact, tongue/uvula midline, all extremities muscle strength 5/5, sensation intact throughout,  negative dysarthria, negative expressive aphasia, negative receptive aphasia.      .     Data Reviewed: Care during the described time interval was provided by me .  I have reviewed this patient's available data, including medical history, events of note, physical examination, and all test results as part of my evaluation.   CBC: Recent Labs  Lab 01/26/18 2033 01/27/18 0028 01/27/18 0332 01/28/18 0511  WBC 2.3* 2.0* 2.2* 1.8*  NEUTROABS 1.4*  --  1.0* 0.8*  HGB 8.8* 8.2* 8.6* 7.8*  HCT 26.7* 24.8* 26.5* 23.6*  MCV 115.1* 114.3* 117.3* 115.1*  PLT 113* 91* 87* 88*   Basic Metabolic Panel: Recent Labs  Lab 01/26/18 2033 01/27/18 0028 01/27/18 0332 01/28/18 0511  NA 139  --  138  140  K 3.4*  --  3.6 3.5  CL 105  --  108 110  CO2 24  --  20* 23  GLUCOSE 196*  --  188* 142*  BUN 21  --  23 21  CREATININE 1.54* 1.46* 1.43* 1.45*  CALCIUM 7.9*  --  7.4* 7.2*   GFR: Estimated Creatinine Clearance: 57.6 mL/min (A) (by C-G formula based on SCr of 1.45 mg/dL (H)). Liver Function Tests: Recent Labs  Lab 01/26/18 2033 01/27/18 0332 01/28/18 0511  AST 65* 58* 53*  ALT 61* 53* 43  ALKPHOS 99 84 78  BILITOT 2.2* 2.2* 1.9*  PROT 6.8 6.1* 5.8*  ALBUMIN 1.9* 1.8* 1.6*   No results for input(s): LIPASE, AMYLASE in the last 168 hours. No results for input(s): AMMONIA in the last 168 hours. Coagulation Profile: Recent Labs  Lab 01/26/18 2033  INR 1.25   Cardiac Enzymes: Recent Labs  Lab 01/27/18 0402 01/27/18 0957  CKTOTAL 65  --   TROPONINI <0.03 <0.03   BNP (  last 3 results) No results for input(s): PROBNP in the last 8760 hours. HbA1C: No results for input(s): HGBA1C in the last 72 hours. CBG: No results for input(s): GLUCAP in the last 168 hours. Lipid Profile: No results for input(s): CHOL, HDL, LDLCALC, TRIG, CHOLHDL, LDLDIRECT in the last 72 hours. Thyroid Function Tests: No results for input(s): TSH, T4TOTAL, FREET4, T3FREE, THYROIDAB in the last 72 hours. Anemia Panel: Recent Labs    01/28/18 0511  VITAMINB12 444  FOLATE 19.7  FERRITIN 138  TIBC 209*  IRON 24*  RETICCTPCT 4.4*   Urine analysis:    Component Value Date/Time   COLORURINE AMBER (A) 01/26/2018 2043   APPEARANCEUR CLEAR 01/26/2018 2043   LABSPEC 1.023 01/26/2018 2043   PHURINE 5.0 01/26/2018 2043   GLUCOSEU 50 (A) 01/26/2018 2043   HGBUR NEGATIVE 01/26/2018 2043   BILIRUBINUR SMALL (A) 01/26/2018 2043   [place] NEGATIVE 01/26/2018 2043   PROTEINUR 30 (A) 01/26/2018 2043   NITRITE NEGATIVE 01/26/2018 2043   LEUKOCYTESUR NEGATIVE 01/26/2018 2043   Sepsis Labs: @LABRCNTIP (procalcitonin:4,lacticidven:4)  ) Recent Results (from the past 240 hour(s))  Culture,  blood (Routine x 2)     Status: None (Preliminary result)   Collection Time: 01/26/18  8:33 PM  Result Value Ref Range Status   Specimen Description BLOOD LEFT ANTECUBITAL  Final   Special Requests   Final    BOTTLES DRAWN AEROBIC AND ANAEROBIC Blood Culture adequate volume   Culture   Final    NO GROWTH < 24 HOURS Performed at [facility], [place] [address] [place], [place] [address]    Report Status PENDING  Incomplete  Culture, blood (Routine x 2)     Status: None (Preliminary result)   Collection Time: 01/26/18  8:33 PM  Result Value Ref Range Status   Specimen Description BLOOD RIGHT FOREARM  Final   Special Requests   Final    BOTTLES DRAWN AEROBIC AND ANAEROBIC Blood Culture adequate volume   Culture   Final    NO GROWTH < 24 HOURS Performed at [facility], [place] [address] [place], [place] [address]    Report Status PENDING  Incomplete  MRSA PCR Screening     Status: None   Collection Time: 01/27/18  8:52 AM  Result Value Ref Range Status   MRSA by PCR NEGATIVE NEGATIVE Final    Comment:        The GeneXpert MRSA Assay (FDA approved for NASAL specimens only), is one component of a comprehensive MRSA colonization surveillance program. It is not intended to diagnose MRSA infection nor to guide or monitor treatment for MRSA infections. Performed at [facility], [place] [address] [place], [place] [address]          Radiology Studies: Dg Chest 2 View  Result Date: 01/26/2018 CLINICAL DATA:  Sepsis EXAM: CHEST - 2 VIEW COMPARISON:  05/09/2017 FINDINGS: The heart size and mediastinal contours are within normal limits. Both lungs are clear. Mild aortic atherosclerosis. The visualized skeletal structures are unremarkable. IMPRESSION: No active cardiopulmonary disease. Electronically Signed   By: Donavan Foil M.D.   On: 01/26/2018 21:33        Scheduled Meds: . atorvastatin  20 mg Oral Daily  . cholecalciferol  1,000 Units Oral Daily  . diltiazem   120 mg Oral Daily  . insulin glargine  10 Units Subcutaneous QHS  . losartan  25 mg Oral Daily  . nadolol  20 mg Oral Daily  .  oxybutynin  5 mg Oral QHS  . pantoprazole  40 mg Oral BID   Continuous Infusions: . sodium chloride 100 mL/hr at 01/27/18 2344  . ceFEPime (MAXIPIME) IV 1 g (01/28/18 0518)     LOS: 2 days    Time spent: 40 minutes    Jalyah Weinheimer, Geraldo Docker, MD [facility] Pager [phone]   If 7PM-7AM, please contact night-coverage [email or web address] Password [email or web address] 01/28/2018, 8:50 AM

## 2018-01-28 NOTE — Progress Notes (Signed)
NIF -40 and VC 1.1L great pt effort noted

## 2018-01-28 NOTE — Care Management Note (Addendum)
Case Management Note  Patient Details  Name: Bob Warren MRN: [id number] Date of Birth: [birth year]  Subjective/Objective:   Admitted with sepsis/ R foot cellulitis, hx of  myasthenia gravis / IVIG every month. Recently admitted  @ [facility] for acute GI bleed and was found to have pancytopenia. From home with wife, [name]. Pt states independent with ADL's PTA,  and used cane , walker intermittently with ambulation.       Felton Buczynski (Spouse)     [phone]        PCP: Vilma Prader  Action/Plan: Transition to home when medically stable. PT  Evaluation pending.... NCM following fro disposition needs.  Expected Discharge Date:                  Expected Discharge Plan:  Home/Self Care  In-House Referral:     Discharge planning Services  CM Consult  Post Acute Care Choice:    Choice offered to:     DME Arranged:  (owns cane and walker) DME Agency:     HH Arranged:    HH Agency:     Status of Service:  In process, will continue to follow  If discussed at Long Length of Stay Meetings, dates discussed:    Additional Comments:  Sharin Mons, RN 01/28/2018, 2:21 PM

## 2018-01-28 NOTE — Progress Notes (Signed)
RT Note: Rt performed respiratory mechanics with the patient. He gave a good effort. Results are as noted below:  Vc- .9 of a Liter Nif- -71  Rt will continue to monitor and perform respiratory mechanics.

## 2018-01-29 ENCOUNTER — Inpatient Hospital Stay (HOSPITAL_COMMUNITY): Payer: Medicare Other

## 2018-01-29 LAB — CBC
HCT: 20.8 % — ABNORMAL LOW (ref 39.0–52.0)
HEMOGLOBIN: 6.8 g/dL — AB (ref 13.0–17.0)
MCH: 37.6 pg — AB (ref 26.0–34.0)
MCHC: 32.7 g/dL (ref 30.0–36.0)
MCV: 114.9 fL — AB (ref 78.0–100.0)
Platelets: 80 10*3/uL — ABNORMAL LOW (ref 150–400)
RBC: 1.81 MIL/uL — ABNORMAL LOW (ref 4.22–5.81)
RDW: 20.5 % — ABNORMAL HIGH (ref 11.5–15.5)
WBC: 1.8 10*3/uL — AB (ref 4.0–10.5)

## 2018-01-29 LAB — PROTIME-INR
INR: 1.33
Prothrombin Time: 16.3 seconds — ABNORMAL HIGH (ref 11.4–15.2)

## 2018-01-29 LAB — COMPREHENSIVE METABOLIC PANEL
ALBUMIN: 2.1 g/dL — AB (ref 3.5–5.0)
ALK PHOS: 65 U/L (ref 38–126)
ALT: 31 U/L (ref 0–44)
AST: 46 U/L — ABNORMAL HIGH (ref 15–41)
Anion gap: 8 (ref 5–15)
BILIRUBIN TOTAL: 1.7 mg/dL — AB (ref 0.3–1.2)
BUN: 23 mg/dL (ref 8–23)
CALCIUM: 7.4 mg/dL — AB (ref 8.9–10.3)
CO2: 21 mmol/L — ABNORMAL LOW (ref 22–32)
CREATININE: 1.57 mg/dL — AB (ref 0.61–1.24)
Chloride: 111 mmol/L (ref 98–111)
GFR calc Af Amer: 49 mL/min — ABNORMAL LOW (ref >60)
GFR calc non Af Amer: 43 mL/min — ABNORMAL LOW (ref >60)
GLUCOSE: 170 mg/dL — AB (ref 70–99)
Potassium: 3.1 mmol/L — ABNORMAL LOW (ref 3.5–5.1)
Sodium: 140 mmol/L (ref 135–145)
TOTAL PROTEIN: 5.5 g/dL — AB (ref 6.5–8.1)

## 2018-01-29 LAB — HEMOGLOBIN AND HEMATOCRIT, BLOOD
HCT: 25.5 % — ABNORMAL LOW (ref 39.0–52.0)
HEMOGLOBIN: 8.4 g/dL — AB (ref 13.0–17.0)

## 2018-01-29 LAB — MAGNESIUM: Magnesium: 1.7 mg/dL (ref 1.7–2.4)

## 2018-01-29 LAB — AMMONIA: AMMONIA: 61 umol/L — AB (ref 9–35)

## 2018-01-29 LAB — LACTIC ACID, PLASMA
LACTIC ACID, VENOUS: 2.2 mmol/L — AB (ref 0.5–1.9)
LACTIC ACID, VENOUS: 2.2 mmol/L — AB (ref 0.5–1.9)
Lactic Acid, Venous: 1.3 mmol/L (ref 0.5–1.9)

## 2018-01-29 LAB — PREPARE RBC (CROSSMATCH)

## 2018-01-29 MED ORDER — ALBUMIN HUMAN 25 % IV SOLN
50.0000 g | Freq: Once | INTRAVENOUS | Status: AC
  Administered 2018-01-29: 25 g via INTRAVENOUS
  Filled 2018-01-29: qty 100

## 2018-01-29 MED ORDER — ONDANSETRON HCL 4 MG/2ML IJ SOLN
4.0000 mg | Freq: Four times a day (QID) | INTRAMUSCULAR | Status: DC | PRN
  Administered 2018-01-29: 4 mg via INTRAVENOUS
  Filled 2018-01-29: qty 2

## 2018-01-29 MED ORDER — POTASSIUM CHLORIDE CRYS ER 20 MEQ PO TBCR
50.0000 meq | EXTENDED_RELEASE_TABLET | Freq: Once | ORAL | Status: AC
  Administered 2018-01-29: 50 meq via ORAL
  Filled 2018-01-29: qty 1

## 2018-01-29 MED ORDER — ACETAMINOPHEN 325 MG PO TABS
650.0000 mg | ORAL_TABLET | Freq: Once | ORAL | Status: AC
  Administered 2018-01-29: 650 mg via ORAL
  Filled 2018-01-29: qty 2

## 2018-01-29 MED ORDER — DIPHENHYDRAMINE HCL 25 MG PO CAPS
25.0000 mg | ORAL_CAPSULE | Freq: Once | ORAL | Status: AC
  Administered 2018-01-29: 25 mg via ORAL
  Filled 2018-01-29: qty 1

## 2018-01-29 MED ORDER — FUROSEMIDE 10 MG/ML IJ SOLN
60.0000 mg | Freq: Two times a day (BID) | INTRAMUSCULAR | Status: DC
  Administered 2018-01-29 – 2018-01-30 (×3): 60 mg via INTRAVENOUS
  Filled 2018-01-29 (×3): qty 6

## 2018-01-29 MED ORDER — ALBUMIN HUMAN 25 % IV SOLN
25.0000 g | Freq: Once | INTRAVENOUS | Status: AC
  Administered 2018-01-29: 25 g via INTRAVENOUS
  Filled 2018-01-29: qty 100

## 2018-01-29 MED ORDER — SODIUM CHLORIDE 0.9% IV SOLUTION: Freq: Once | INTRAVENOUS | Status: DC

## 2018-01-29 MED ORDER — COLLAGENASE 250 UNIT/GM EX OINT
TOPICAL_OINTMENT | Freq: Every day | CUTANEOUS | Status: DC
  Administered 2018-01-29: 1 via TOPICAL
  Administered 2018-01-30 – 2018-02-03 (×5): via TOPICAL
  Filled 2018-01-29: qty 30

## 2018-01-29 NOTE — Consult Note (Signed)
[facility] Nurse wound consult note Reason for Consult: right foot wound Present per patient report "about 2 weeks". Began with blister when patient had severe LE edema. Attempted diuresis as outpatient. Now admitted with generalized weakness from SNF Wound type: partial thickness ulcer related to volume overload  Pressure Injury POA: NA Measurement:4cm x 3cm x 0.1cm  Wound bed:10% pink along wound perimeter/ 90% yellow [place] thin slough Drainage (amount, consistency, odor) minimal, no odor Periwound:2+ pitting edema, pulse weak but present Dressing procedure/placement/frequency: Add enzymatic debridement ointment, top with saline moist gauze. Top with dry dressing. Change daily.  Maintain BS control. Follow up PCP or facility MD to monitor for wound healing in the presence of DM  Discussed POC with patient and bedside nurse.  Re consult if needed, will not follow at this time. Thanks  Kenon Delashmit [employer], RN,CWOCN, CNS, [place] [phone])

## 2018-01-29 NOTE — Progress Notes (Signed)
Patient just reported to RN that he has had a reaction to blood transfusions before and is concerned about it. Reaction included, per patient, "chills, fever, some n/v- I was just plain ill." NP on call has been paged about concern- awaiting call return.

## 2018-01-29 NOTE — Progress Notes (Signed)
PROGRESS NOTE    Bob Warren  CNO:[id number] DOB: Dec 12, 1945 DOA: 01/26/2018 PCP: [name], MD   Brief Narrative:  72yo WM PMHx patient seen at [facility] Diabetes Type 2 Uncontrolled with complication,Diabetic neuropathy, HLD, HTN,Paroxysmal atrial fibrillation, Acute Diastolic CHF, Liver Cirrhosis, GI bleed, CKD stage II, myasthenia Gravis (IVIG every month - due next week), Drug-induced pancytopenia  Discharged from [facility] 2 days prior after tx for an acute GI bleed w/ pancytopenia.  He was instructed to discontinue Imuran.  He presented to the ER with weakness and difficulty ambulating.     In the ER patient was found to be tachycardic and febrile to 102 F.  CXR was unremarkable. UA was unremarkable.  Patient was noted to have an ulceration on his right foot which was red in color.  Labs revealed creatinine at baseline with elevated lactate level and pancytopenia.     Subjective: 7/4/ ast 24 hours afebrile.    / AO x4, negative CP, positive mild S OB, positive mild abdominal pain secondary to increased girth, positive RLE pain.   Assessment & Plan:   Principal Problem:   Sepsis ([place]) Active Problems:   Hypertension   Myasthenia gravis with acute exacerbation (HCC)   Drug-induced pancytopenia (HCC)   Diabetes mellitus type 2 in obese (HCC)  Sepsis/RIGHT foot cellulitis - Vancomycin avoided secondary to history of myasthenia gravis - Continue current antibiotic regimen -Wound care nurse consulted.  Patient is a diabetic  Generalized Weakness/Myasthenia Gravis -Seen by [facility] -Per Dr. Thereasa Solo note spoke with neurology they do not feel MG exacerbation -Imuran recently discontinued due to pancytopenia  Other liver cirrhosis with elevated LFT -Previous abdominal ultrasound showed cirrhosis with small ascites: Ultrasound abdomen 01/14/2018 - Previous admission acute hepatitis negative (01/13/2018) -Per patient and  wife never worked up.  Had appointment with GI physician at [facility] but has been hospitalized - Bilirubin fractions, ANA, anti-smooth muscle antibody, anti-KLM antibody, immunoglobulin level, HSV, GGT pending - 7/5 consult GI - Although patient states abdomen distention decreased on exam appears to have significant distention and ascites.  Abdominal US ascites study pending - If significant ascites apparent paracentesis at appropriate time when sepsis resolved. -Imuran hepatotoxic.  Single cause of liver failure?  Acute diastolic CHF (unknown based weight) -Strict in and out since admission +4.1 L -Daily weight Filed Weights   01/27/18 0500 01/28/18 0500 01/29/18 0446  Weight: 229 lb 4.5 oz (104 kg) 231 lb 11.3 oz (105.1 kg) 235 lb 7.2 oz (106.8 kg)  - Patient grossly fluid overloaded, secondary to his liver failure more so than CHF. -Transfuse for hemoglobin<8 - 7/4 repeat Albumin 50 g x 1  -Diltiazem 120 mg daily - 7/4 increase Lasix 60 mg IV BID  - Losartan 25 mg daily -Nadolol 20 mg daily  Atrial fibrillation unspecified - Currently NSR - Not on anticoagulation at home  Acute on CKD stage II (baseline Cr 1.1 at [facility] 01/23/2018) - Hepatorenal syndrome? -Hold all nephrotoxic medication -  See CHF Recent Labs  Lab 01/26/18 2033 01/27/18 0028 01/27/18 0332 01/28/18 0511 01/29/18 0343  CREATININE 1.54* 1.46* 1.43* 1.45* 1.57*    Hypokalemia -Potassium goal> 4 -K-Dur 50 mEq   Acute delirium? -Patient answers all questions appropriately.  Interacting appropriately with family members.   Pancytopenia - Attributed to Imuran   Ascites/peripheral edema -Secondary to liver cirrhosis      DVT prophylaxis: None Code Status: DNR Family Communication: Wife at bedside for discussion  plan of care Disposition Plan: TBD    Consultants:  WOC pending      Procedures/Significant Events:     I have personally reviewed and interpreted all radiology studies  and my findings are as above.  VENTILATOR SETTINGS:    Cultures   Antimicrobials: Anti-infectives (From admission, onward)   Start     Stop   01/28/18 2200  ceFEPIme (MAXIPIME) 1 g in sodium chloride 0.9 % 100 mL IVPB         01/27/18 0030  ceFEPIme (MAXIPIME) 1 g in sodium chloride 0.9 % 100 mL IVPB  Status:  Discontinued     01/28/18 0904   01/27/18 0015  linezolid (ZYVOX) IVPB 600 mg     01/27/18 0201   01/26/18 2145  ceFAZolin (ANCEF) IVPB 1 g/50 mL premix     01/26/18 2226       Devices    LINES / TUBES:       Continuous Infusions: . sodium chloride 100 mL/hr at 01/29/18 0635  . ceFEPime (MAXIPIME) IV 1 g (01/28/18 2140)     Objective: Vitals:   01/28/18 2049 01/28/18 2337 01/29/18 0340 01/29/18 0446  BP:  (!) 126/56 128/62   Pulse:  68 66   Resp:  (!) 26 (!) 25   Temp: 99.1 F (37.3 C) 99 F (37.2 C) 98.9 F (37.2 C)   TempSrc: Oral Oral Oral   SpO2:  96% 95%   Weight:    235 lb 7.2 oz (106.8 kg)  Height:        Intake/Output Summary (Last 24 hours) at 01/29/2018 0735 Last data filed at 01/29/2018 0400 Gross per 24 hour  Intake 2978.49 ml  Output 1750 ml  Net 1228.49 ml   Filed Weights   01/27/18 0500 01/28/18 0500 01/29/18 0446  Weight: 229 lb 4.5 oz (104 kg) 231 lb 11.3 oz (105.1 kg) 235 lb 7.2 oz (106.8 kg)    Physical Exam:  General: A/O x4+ mild acute respiratory distress Neck:  Negative scars, masses, torticollis, lymphadenopathy, JVD Lungs: Clear to auscultation bilaterally without wheezes or crackles Cardiovascular: Regular rate and rhythm without murmur gallop or rub normal S1 and S2 Abdomen: negative abdominal pain, positive distention, positive soft, bowel sounds, no rebound, positive ascites, no appreciable mass Extremities: No significant cyanosis, clubbing, positive bilateral lower extremity edema 2-3+ to the hips, Skin: Positive RIGHT foot ulcer see picture below Psychiatric:  Negative depression, negative anxiety, negative  fatigue, negative mania  Central nervous system:  Cranial nerves II through XII intact, tongue/uvula midline, all extremities muscle strength 5/5, sensation intact throughout,  negative dysarthria, negative expressive aphasia, negative receptive aphasia.       .     Data Reviewed: Care during the described time interval was provided by me .  I have reviewed this patient's available data, including medical history, events of note, physical examination, and all test results as part of my evaluation.   CBC: Recent Labs  Lab 01/26/18 2033 01/27/18 0028 01/27/18 0332 01/28/18 0511 01/29/18 0343  WBC 2.3* 2.0* 2.2* 1.8* 1.8*  NEUTROABS 1.4*  --  1.0* 0.8*  --   HGB 8.8* 8.2* 8.6* 7.8* 6.8*  HCT 26.7* 24.8* 26.5* 23.6* 20.8*  MCV 115.1* 114.3* 117.3* 115.1* 114.9*  PLT 113* 91* 87* 88* 80*   Basic Metabolic Panel: Recent Labs  Lab 01/26/18 2033 01/27/18 0028 01/27/18 0332 01/28/18 0511 01/29/18 0343  NA 139  --  138 140 140  K 3.4*  --  3.6 3.5 3.1*  CL 105  --  108 110 111  CO2 24  --  20* 23 21*  GLUCOSE 196*  --  188* 142* 170*  BUN 21  --  23 21 23   CREATININE 1.54* 1.46* 1.43* 1.45* 1.57*  CALCIUM 7.9*  --  7.4* 7.2* 7.4*   GFR: Estimated Creatinine Clearance: 53.7 mL/min (A) (by C-G formula based on SCr of 1.57 mg/dL (H)). Liver Function Tests: Recent Labs  Lab 01/26/18 2033 01/27/18 0332 01/28/18 0511 01/29/18 0343  AST 65* 58* 53* 46*  ALT 61* 53* 43 31  ALKPHOS 99 84 78 65  BILITOT 2.2* 2.2* 1.9* 1.7*  PROT 6.8 6.1* 5.8* 5.5*  ALBUMIN 1.9* 1.8* 1.6* 2.1*   No results for input(s): LIPASE, AMYLASE in the last 168 hours. Recent Labs  Lab 01/29/18 0343  AMMONIA 61*   Coagulation Profile: Recent Labs  Lab 01/26/18 2033 01/29/18 0343  INR 1.25 1.33   Cardiac Enzymes: Recent Labs  Lab 01/27/18 0402 01/27/18 0957  CKTOTAL 65  --   TROPONINI <0.03 <0.03   BNP (last 3 results) No results for input(s): PROBNP in the last 8760  hours. HbA1C: No results for input(s): HGBA1C in the last 72 hours. CBG: Recent Labs  Lab 01/28/18 2113  GLUCAP 184*   Lipid Profile: No results for input(s): CHOL, HDL, LDLCALC, TRIG, CHOLHDL, LDLDIRECT in the last 72 hours. Thyroid Function Tests: No results for input(s): TSH, T4TOTAL, FREET4, T3FREE, THYROIDAB in the last 72 hours. Anemia Panel: Recent Labs    01/28/18 0511  VITAMINB12 444  FOLATE 19.7  FERRITIN 138  TIBC 209*  IRON 24*  RETICCTPCT 4.4*   Urine analysis:    Component Value Date/Time   COLORURINE AMBER (A) 01/26/2018 2043   APPEARANCEUR CLEAR 01/26/2018 2043   LABSPEC 1.023 01/26/2018 2043   PHURINE 5.0 01/26/2018 2043   GLUCOSEU 50 (A) 01/26/2018 2043   HGBUR NEGATIVE 01/26/2018 2043   BILIRUBINUR SMALL (A) 01/26/2018 2043   [place] NEGATIVE 01/26/2018 2043   PROTEINUR 30 (A) 01/26/2018 2043   NITRITE NEGATIVE 01/26/2018 2043   LEUKOCYTESUR NEGATIVE 01/26/2018 2043   Sepsis Labs: @LABRCNTIP (procalcitonin:4,lacticidven:4)  ) Recent Results (from the past 240 hour(s))  Urine culture     Status: Abnormal   Collection Time: 01/26/18  8:24 PM  Result Value Ref Range Status   Specimen Description URINE, CLEAN CATCH  Final   Special Requests NONE  Final   Culture (A)  Final    <10,000 COLONIES/mL INSIGNIFICANT GROWTH Performed at [facility], [address] [place], [place] [address]    Report Status 01/28/2018 FINAL  Final  Culture, blood (Routine x 2)     Status: None (Preliminary result)   Collection Time: 01/26/18  8:33 PM  Result Value Ref Range Status   Specimen Description BLOOD LEFT ANTECUBITAL  Final   Special Requests   Final    BOTTLES DRAWN AEROBIC AND ANAEROBIC Blood Culture adequate volume   Culture   Final    NO GROWTH 2 DAYS Performed at [facility], [place] [address], [place], [place] [address]    Report Status PENDING  Incomplete  Culture, blood (Routine x 2)     Status: None (Preliminary result)    Collection Time: 01/26/18  8:33 PM  Result Value Ref Range Status   Specimen Description BLOOD RIGHT FOREARM  Final   Special Requests   Final    BOTTLES DRAWN AEROBIC AND ANAEROBIC Blood Culture adequate  volume   Culture   Final    NO GROWTH 2 DAYS Performed at [facility], [place] [address] [place], [place] [address]    Report Status PENDING  Incomplete  MRSA PCR Screening     Status: None   Collection Time: 01/27/18  8:52 AM  Result Value Ref Range Status   MRSA by PCR NEGATIVE NEGATIVE Final    Comment:        The GeneXpert MRSA Assay (FDA approved for NASAL specimens only), is one component of a comprehensive MRSA colonization surveillance program. It is not intended to diagnose MRSA infection nor to guide or monitor treatment for MRSA infections. Performed at [facility], [place] [address], [place], [place] [address]          Radiology Studies: No results found.      Scheduled Meds: . sodium chloride   Intravenous Once  . acetaminophen  650 mg Oral Once  . atorvastatin  20 mg Oral Daily  . cholecalciferol  1,000 Units Oral Daily  . diltiazem  120 mg Oral Daily  . diphenhydrAMINE  25 mg Oral Once  . furosemide  40 mg Intravenous BID  . insulin glargine  10 Units Subcutaneous QHS  . losartan  25 mg Oral Daily  . nadolol  20 mg Oral Daily  . oxybutynin  5 mg Oral QHS  . pantoprazole  40 mg Oral BID   Continuous Infusions: . sodium chloride 100 mL/hr at 01/29/18 0635  . ceFEPime (MAXIPIME) IV 1 g (01/28/18 2140)     LOS: 3 days    Time spent: 40 minutes    WOODS, Geraldo Docker, MD [facility] Pager [phone]   If 7PM-7AM, please contact night-coverage [email or web address] Password [facility] 01/29/2018, 7:35 AM

## 2018-01-29 NOTE — Progress Notes (Signed)
CRITICAL VALUE ALERT  Critical value received:  hgb 6.8  Date of notification:  7/4  Time of notification:  5:10am  Critical value read back:Yes.    Nurse who received alert:  Dora   MD notified (1st page):  NP, Baltazar Najjar  Time of first page:  5:23am  MD notified (2nd page):  Time of second page:  Responding MD:  Awaiting response   Time MD responded:

## 2018-01-29 NOTE — Progress Notes (Signed)
CRITICAL VALUE ALERT  Critical Value:  Lactic Acid 2.2  Date & Time Notied:  01/29/2018; 14:30  Provider Notified: Dr. Sherral Hammers  Orders Received/Actions taken: waiting for new order

## 2018-01-29 NOTE — Evaluation (Signed)
Physical Therapy Evaluation Patient Details Name: Bob Warren MRN: [id number] DOB: 1945-11-25 Today's Date: 01/29/2018   History of Present Illness  72yo male with a history of myasthenia gravis (IVIG every month - due next week) who was discharged from [facility] 2 days prior after tx for an acute GI bleed w/ pancytopenia.  He was instructed to discontinue Imuran.  He presented to the ER with weakness and difficulty ambulating.  Dx with Sepsis due to R foot cellulitis  Clinical Impression  Pt admitted with above diagnosis. Pt currently with functional limitations due to the deficits listed below (see PT Problem List). PTA pt independent with limited community ambulation with cane and independent with ADLs. Pt currently requires mod A for bed mobility and sit>stand and pivot transfer to recliner using RW.  Pt will benefit from skilled PT to increase their independence and safety with mobility to allow discharge to the venue listed below.       Follow Up Recommendations SNF    Equipment Recommendations  Other (comment)(TBD at next venue)    Recommendations for Other Services OT consult     Precautions / Restrictions Precautions Precautions: Fall Restrictions Weight Bearing Restrictions: No      Mobility  Bed Mobility Overal bed mobility: Needs Assistance Bed Mobility: Supine to Sit     Supine to sit: Mod assist     General bed mobility comments: modA for managment of LE off bed and trunk to upright as well as pad scoot of hips to EoB  Transfers Overall transfer level: Needs assistance Equipment used: Rolling walker (2 wheeled) Transfers: Sit to/from [facility] Sit to Stand: Mod assist;From elevated surface Stand pivot transfers: Mod assist       General transfer comment: modA for power up and steadying with RW, modA for steadying with stand step pivot to chair, vc for sequencing and for remaining upright until in front of  chair  Ambulation/Gait             General Gait Details: unable at this time        Balance Overall balance assessment: Needs assistance Sitting-balance support: Feet supported;No upper extremity supported Sitting balance-Leahy Scale: Fair     Standing balance support: Bilateral upper extremity supported Standing balance-Leahy Scale: Poor Standing balance comment: requires RW assit                             Pertinent Vitals/Pain Pain Assessment: No/denies pain    Home Living Family/patient expects to be discharged to:: Private residence Living Arrangements: Spouse/significant other Available Help at Discharge: Available 24 hours/day;Family Type of Home: House Home Access: Stairs to enter Entrance Stairs-Rails: Right Entrance Stairs-Number of Steps: 3 Home Layout: One level Home Equipment: Grab bars - toilet;Grab bars - tub/shower;Shower seat - built in;Cane - single point;Walker - 4 wheels      Prior Function Level of Independence: Independent with assistive device(s)         Comments: limited community ambulation with cane         Extremity/Trunk Assessment   Upper Extremity Assessment Upper Extremity Assessment: Generalized weakness    Lower Extremity Assessment Lower Extremity Assessment: RLE deficits/detail;LLE deficits/detail RLE Deficits / Details: ROM WFL, hip strength grossly 2+/5, knee extension 3+/5 RLE Coordination: decreased fine motor LLE Deficits / Details: ROM WFL, hip strength grossly 2+/5, knee extension 3+/5 LLE Coordination: decreased fine motor    Cervical / Trunk Assessment Cervical /  Trunk Assessment: Kyphotic  Communication   Communication: No difficulties  Cognition Arousal/Alertness: Awake/alert Behavior During Therapy: WFL for tasks assessed/performed Overall Cognitive Status: Within Functional Limits for tasks assessed                                        General Comments General  comments (skin integrity, edema, etc.): Wife present during session, VSS throughout        Assessment/Plan    PT Assessment Patient needs continued PT services  PT Problem List Decreased strength;Decreased activity tolerance;Decreased balance;Decreased mobility       PT Treatment Interventions DME instruction;Gait training;Stair training;Functional mobility training;Therapeutic activities;Therapeutic exercise;Balance training;Cognitive remediation;Patient/family education    PT Goals (Current goals can be found in the Care Plan section)  Acute Rehab PT Goals Patient Stated Goal: go home PT Goal Formulation: With patient/family Time For Goal Achievement: 02/12/18 Potential to Achieve Goals: Fair    Frequency Min 2X/week    AM-PAC PT "6 Clicks" Daily Activity  Outcome Measure Difficulty turning over in bed (including adjusting bedclothes, sheets and blankets)?: A Lot Difficulty moving from lying on back to sitting on the side of the bed? : Unable Difficulty sitting down on and standing up from a chair with arms (e.g., wheelchair, bedside commode, etc,.)?: Unable Help needed moving to and from a bed to chair (including a wheelchair)?: A Lot Help needed walking in hospital room?: Total Help needed climbing 3-5 steps with a railing? : Total 6 Click Score: 8    End of Session Equipment Utilized During Treatment: Gait belt Activity Tolerance: Patient limited by fatigue Patient left: in chair;with call bell/phone within reach;with chair alarm set;with family/visitor present Nurse Communication: Mobility status PT Visit Diagnosis: Unsteadiness on feet (R26.81);Other abnormalities of gait and mobility (R26.89);Muscle weakness (generalized) (M62.81);Difficulty in walking, not elsewhere classified (R26.2)    Time: 5093-2671 PT Time Calculation (min) (ACUTE ONLY): 43 min   Charges:   PT Evaluation $PT Eval Moderate Complexity: 1 Mod PT Treatments $Therapeutic Activity: 23-37  mins   PT G Codes:        Tranika Scholler B. Migdalia Dk PT, DPT Acute Rehabilitation  [phone] Pager [phone]    [place] 01/29/2018, 1:48 PM

## 2018-01-29 NOTE — Progress Notes (Signed)
NIF -38 and VC 1L

## 2018-01-29 NOTE — Progress Notes (Signed)
Patient has had a temp of 99 twice. RN asked patient if he felt sick or ill and he replied no. RN noticed temp gauge on high and patient under 2 blankets. RN spoke to patient about it but he stated that he liked the room like that and wanted to stay under the blankets. Will continue to monitor

## 2018-01-29 NOTE — Progress Notes (Addendum)
CRITICAL VALUE ALERT  Critical Value:  Lactic Acid 2.2  Date & Time Notied:  01/29/2018; 16:30  Provider Notified: Dr. Sherral Hammers  Orders Received/Actions taken: no new order at this time

## 2018-01-30 DIAGNOSIS — K7200 Acute and subacute hepatic failure without coma: Secondary | ICD-10-CM

## 2018-01-30 LAB — BASIC METABOLIC PANEL
ANION GAP: 6 (ref 5–15)
BUN: 24 mg/dL — ABNORMAL HIGH (ref 8–23)
CALCIUM: 7.8 mg/dL — AB (ref 8.9–10.3)
CO2: 23 mmol/L (ref 22–32)
CREATININE: 1.66 mg/dL — AB (ref 0.61–1.24)
Chloride: 113 mmol/L — ABNORMAL HIGH (ref 98–111)
GFR calc Af Amer: 46 mL/min — ABNORMAL LOW (ref >60)
GFR calc non Af Amer: 40 mL/min — ABNORMAL LOW (ref >60)
Glucose, Bld: 163 mg/dL — ABNORMAL HIGH (ref 70–99)
Potassium: 3.3 mmol/L — ABNORMAL LOW (ref 3.5–5.1)
Sodium: 142 mmol/L (ref 135–145)

## 2018-01-30 LAB — CBC
HCT: 27.8 % — ABNORMAL LOW (ref 39.0–52.0)
HEMOGLOBIN: 9.2 g/dL — AB (ref 13.0–17.0)
MCH: 35.9 pg — AB (ref 26.0–34.0)
MCHC: 33.1 g/dL (ref 30.0–36.0)
MCV: 108.6 fL — AB (ref 78.0–100.0)
PLATELETS: 81 10*3/uL — AB (ref 150–400)
RBC: 2.56 MIL/uL — ABNORMAL LOW (ref 4.22–5.81)
RDW: 22.2 % — ABNORMAL HIGH (ref 11.5–15.5)
WBC: 2.3 10*3/uL — ABNORMAL LOW (ref 4.0–10.5)

## 2018-01-30 LAB — TYPE AND SCREEN
ABO/RH(D): A POS
Antibody Screen: NEGATIVE
UNIT DIVISION: 0

## 2018-01-30 LAB — MAGNESIUM: MAGNESIUM: 1.7 mg/dL (ref 1.7–2.4)

## 2018-01-30 LAB — BILIRUBIN, FRACTIONATED(TOT/DIR/INDIR)
Bilirubin, Direct: 0.8 mg/dL — ABNORMAL HIGH (ref 0.0–0.2)
Indirect Bilirubin: 1.2 mg/dL — ABNORMAL HIGH (ref 0.3–0.9)
Total Bilirubin: 2 mg/dL — ABNORMAL HIGH (ref 0.3–1.2)

## 2018-01-30 LAB — GAMMA GT: GGT: 72 U/L — AB (ref 7–50)

## 2018-01-30 LAB — GLUCOSE, CAPILLARY: Glucose-Capillary: 257 mg/dL — ABNORMAL HIGH (ref 70–99)

## 2018-01-30 LAB — BPAM RBC
BLOOD PRODUCT EXPIRATION DATE: 201907262359
ISSUE DATE / TIME: 201907040802
UNIT TYPE AND RH: 6200

## 2018-01-30 MED ORDER — FUROSEMIDE 10 MG/ML IJ SOLN
80.0000 mg | Freq: Two times a day (BID) | INTRAMUSCULAR | Status: DC
  Administered 2018-01-31 – 2018-02-01 (×3): 80 mg via INTRAVENOUS
  Filled 2018-01-30 (×4): qty 8

## 2018-01-30 MED ORDER — FUROSEMIDE 10 MG/ML IJ SOLN: 60.0000 mg | Freq: Two times a day (BID) | INTRAMUSCULAR | Status: DC

## 2018-01-30 MED ORDER — ACETAMINOPHEN 500 MG PO TABS: 500.0000 mg | ORAL_TABLET | Freq: Four times a day (QID) | ORAL | Status: DC | PRN

## 2018-01-30 MED ORDER — TRAMADOL HCL 50 MG PO TABS
50.0000 mg | ORAL_TABLET | Freq: Four times a day (QID) | ORAL | Status: DC | PRN
  Administered 2018-01-30 – 2018-02-03 (×5): 50 mg via ORAL
  Filled 2018-01-30 (×6): qty 1

## 2018-01-30 MED ORDER — POTASSIUM CHLORIDE CRYS ER 20 MEQ PO TBCR
40.0000 meq | EXTENDED_RELEASE_TABLET | Freq: Once | ORAL | Status: AC
  Administered 2018-01-30: 40 meq via ORAL
  Filled 2018-01-30: qty 2

## 2018-01-30 MED ORDER — FUROSEMIDE 10 MG/ML IJ SOLN
20.0000 mg | Freq: Once | INTRAMUSCULAR | Status: AC
  Administered 2018-01-30: 20 mg via INTRAVENOUS
  Filled 2018-01-30: qty 2

## 2018-01-30 NOTE — Progress Notes (Signed)
Patient had nonsustained episode of bradycardia while asleep, HR 39. HR immediately back up to 50-60's, denies feeling symptomatic upon awakening. [place], NP made aware, will monitor

## 2018-01-30 NOTE — Progress Notes (Signed)
[place] TEAM 1 - Stepdown/ICU TEAM  Bob Warren  HFW:[id number] DOB: September 01, 1945 DOA: 01/26/2018 PCP: [name], MD    Brief Narrative:  72yo male with a history of myasthenia gravis (IVIG every month - due next week) who was discharged from [facility] 2 days prior after tx for an acute GI bleed w/ pancytopenia.  He was instructed to discontinue Imuran.  He presented to the ER with weakness and difficulty ambulating.    In the ER patient was found to be tachycardic and febrile to 102 F.  CXR was unremarkable. UA was unremarkable.  Patient was noted to have an ulceration on his right foot which was red in color.  Labs revealed creatinine at baseline with elevated lactate level and pancytopenia.   Significant Events: 7/2 admit   Subjective: Resting comfortable in a bedside chair.  Alert and oriented.  Denies chest pain nausea or vomiting.  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 - elevated LFTs holding Lasix and spironolactone - also on nadolol chronically - previous abdominal US showed cirrhosis with small ascites 01/14/2018 - acute hepatitis negative (01/13/2018) - had appointment with GI physician at [facility] but has been hospitalized - ANA, anti-smooth muscle antibody, anti-KLM antibody, immunoglobulin level, HSV, GGT pending - ?Imuran hepatotoxicity  Mild diastolic CHF EF preserved on TTE w/ grade 1 DD (01/27/18) - not likely clinically significant   Mild acute delirium  Resolved   Pancytopenia attributed to Imuran - now off Imuran - plt count stable   Chronic kidney disease stage II creatinine slowly climbing - follow trend w/ attempts to  diurese   Recent Labs  Lab 01/27/18 0028 01/27/18 0332 01/28/18 0511 01/29/18 0343 01/30/18 0450  CREATININE 1.46* 1.43* 1.45* 1.57* 1.66*   Diabetes mellitus type 2 CBG reasonably controlled  Hypertension Blood pressure controlled at this time  Peripheral edema secondary to 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 rate (!) 28, height 5' 11"  (1.803 m), weight 106.4 kg (234 lb 9.1 oz), SpO2 99 %.  Intake/Output Summary (Last 24 hours) at 01/30/2018 1500 Last data filed at 01/30/2018 1400 Gross per 24 hour  Intake 1794.32 ml  Output 1025 ml  Net 769.32 ml   Filed Weights   01/28/18 0500 01/29/18 0446 01/30/18 0500  Weight: 105.1 kg (231 lb 11.3 oz) 106.8 kg (235 lb 7.2 oz) 106.4 kg (234 lb 9.1 oz)    Examination: General: No acute respiratory distress Lungs: Clear to auscultation bilaterally without wheezes or crackles Cardiovascular: Regular rate and rhythm without murmur gallop or rub normal S1 and S2 Abdomen: Nontender, soft, no rebound, obvious ascites Extremities: 2+ anasarca   CBC: Recent Labs  Lab 01/26/18 2033  01/27/18 0332 01/28/18 0511 01/29/18 0343 01/29/18 1337 01/30/18 0450  WBC 2.3*   < >  2.2* 1.8* 1.8*  --  2.3*  NEUTROABS 1.4*  --  1.0* 0.8*  --   --   --   HGB 8.8*   < > 8.6* 7.8* 6.8* 8.4* 9.2*  HCT 26.7*   < > 26.5* 23.6* 20.8* 25.5* 27.8*  MCV 115.1*   < > 117.3* 115.1* 114.9*  --  108.6*  PLT 113*   < > 87* 88* 80*  --  81*   < > = values in this interval not displayed.   Basic Metabolic Panel: Recent Labs  Lab 01/28/18 0511  01/29/18 0343 01/29/18 0757 01/30/18 0450  NA 140 140  --  142  K 3.5 3.1*  --  3.3*  CL 110 111  --  113*  CO2 23 21*  --  23  GLUCOSE 142* 170*  --  163*  BUN 21 23  --  24*  CREATININE 1.45* 1.57*  --  1.66*  CALCIUM 7.2* 7.4*  --  7.8*  MG  --   --  1.7 1.7   GFR: Estimated Creatinine Clearance: 50.6 mL/min (A) (by C-G formula based on SCr of 1.66 mg/dL (H)).  Liver Function Tests: Recent Labs  Lab 01/26/18 2033 01/27/18 0332 01/28/18 0511 01/29/18 0343 01/30/18 0450  AST 65* 58* 53* 46*  --   ALT 61* 53* 43 31  --   ALKPHOS 99 84 78 65  --   BILITOT 2.2* 2.2* 1.9* 1.7* 2.0*  PROT 6.8 6.1* 5.8* 5.5*  --   ALBUMIN 1.9* 1.8* 1.6* 2.1*  --     Coagulation Profile: Recent Labs  Lab 01/26/18 2033 01/29/18 0343  INR 1.25 1.33    Cardiac Enzymes: Recent Labs  Lab 01/27/18 0402 01/27/18 0957  CKTOTAL 65  --   TROPONINI <0.03 <0.03    HbA1C: Hgb A1c MFr Bld  Date/Time Value Ref Range Status  07/03/2017 12:53 PM 7.7 (H) 4.8 - 5.6 % Final    Comment:             Prediabetes: 5.7 - 6.4          Diabetes: >6.4          Glycemic control for adults with diabetes: <7.0   04/01/2017 04:41 AM 7.4 (H) 4.8 - 5.6 % Final    Comment:    (NOTE) Pre diabetes:          5.7%-6.4% Diabetes:              >6.4% Glycemic control for   <7.0% adults with diabetes     Recent Results (from the past 240 hour(s))  Urine culture     Status: Abnormal   Collection Time: 01/26/18  8:24 PM  Result Value Ref Range Status   Specimen Description URINE, CLEAN CATCH  Final   Special Requests NONE  Final   Culture (A)  Final    <10,000 COLONIES/mL INSIGNIFICANT GROWTH Performed at [facility], [address] [place], [place] [address]    Report Status 01/28/2018 FINAL  Final  Culture, blood (Routine x 2)     Status: None (Preliminary result)   Collection Time: 01/26/18  8:33 PM  Result Value Ref Range Status   Specimen Description BLOOD LEFT ANTECUBITAL  Final    Special Requests   Final    BOTTLES DRAWN AEROBIC AND ANAEROBIC Blood Culture adequate volume   Culture   Final    NO GROWTH 4 DAYS Performed at [facility], [place]  [address] [place], [place] [address]    Report Status PENDING  Incomplete  Culture, blood (Routine x 2)     Status: None (Preliminary result)   Collection Time: 01/26/18  8:33 PM  Result Value Ref Range Status   Specimen Description BLOOD RIGHT FOREARM  Final   Special Requests   Final    BOTTLES DRAWN AEROBIC AND ANAEROBIC Blood Culture adequate volume   Culture   Final    NO GROWTH 4 DAYS Performed at [facility], [address], [place], [place] [address]    Report Status PENDING  Incomplete  MRSA PCR Screening     Status: None   Collection Time: 01/27/18  8:52 AM  Result Value Ref Range Status   MRSA by PCR NEGATIVE NEGATIVE Final    Comment:        The GeneXpert MRSA Assay (FDA approved for NASAL specimens only), is one component of a comprehensive MRSA colonization surveillance program. It is not intended to diagnose MRSA infection nor to guide or monitor treatment for MRSA infections. Performed at [facility], [place] [address] [place], [place] [address]      Scheduled Meds: . sodium chloride   Intravenous Once  . atorvastatin  20 mg Oral Daily  . cholecalciferol  1,000 Units Oral Daily  . collagenase   Topical Daily  . diltiazem  120 mg Oral Daily  . furosemide  60 mg Intravenous BID  . insulin glargine  10 Units Subcutaneous QHS  . losartan  25 mg Oral Daily  . nadolol  20 mg Oral Daily  . oxybutynin  5 mg Oral QHS  . pantoprazole  40 mg Oral BID   Continuous Infusions: . sodium chloride 100 mL/hr at 01/30/18 0316  . ceFEPime (MAXIPIME) IV 1 g (01/30/18 0959)     LOS: 4 days   Cherene Altes, MD [facility] Office  [phone] Pager - Text Page per Amion as per below:  On-Call/Text Page:      [email or web address]      password [email or web address]  If 7PM-7AM, please contact  night-coverage [email or web address] Password [email or web address] 01/30/2018, 3:00 PM

## 2018-01-30 NOTE — Progress Notes (Signed)
OT Note - Addendum    01/30/18 1257  OT Visit Information  Last OT Received On 01/30/18  OT Time Calculation  OT Start Time (ACUTE ONLY) 1141  OT Stop Time (ACUTE ONLY) 1212  OT Time Calculation (min) 31 min  OT General Charges  $OT Visit 1 Visit  OT Evaluation  $OT Eval Moderate Complexity 1 Mod  OT Treatments  $Self Care/Home Management  8-22 mins  Maurie Boettcher, OT/L  OT Clinical Specialist [phone]

## 2018-01-30 NOTE — Progress Notes (Signed)
RT NOTE: NIF: -25 VC: 0.85L

## 2018-01-30 NOTE — Care Management Important Message (Signed)
Important Message  Patient Details  Name: Bob Warren MRN: [id number] Date of Birth: Mar 19, 1946   Medicare Important Message Given:  Yes    [name] 01/30/2018, 4:27 PM

## 2018-01-30 NOTE — Progress Notes (Signed)
Occupational Therapy Evaluation Patient Details Name: Bob Warren MRN: [id number] DOB: Oct 30, 1945 Today's Date: 01/30/2018    History of Present Illness 72yo male with a history of myasthenia gravis (IVIG every month - due next week) who was discharged from [facility] 2 days prior after tx for an acute GI bleed w/ pancytopenia.  He was instructed to discontinue Imuran.  He presented to the ER with weakness and difficulty ambulating.  Dx with Sepsis due to R foot cellulitis   Clinical Impression   PTA, pt was living at home with wife, and was independent with ADLs and independent with limited functional mobility with use of cane. Pt currently requires modA for ADLs and functional mobility at RW level. During stand pivot transfer pt demonstrated increased wob and DoE 3/4 with RR briefly 33 and SpO2 98 RA. Due to decline in current level of function and deficits listed below (see OT problem list), pt would benefit from acute OT to address establish goals to facilitate safe D/C home. At this time, recommend SNF follow-up.   BP 147/69 supine SpO2 91-98 RA HR 59-63      Follow Up Recommendations  SNF;Supervision/Assistance - 24 hour    Equipment Recommendations  None recommended by OT    Recommendations for Other Services       Precautions / Restrictions Precautions Precautions: Fall Restrictions Weight Bearing Restrictions: No      Mobility Bed Mobility Overal bed mobility: Needs Assistance Bed Mobility: Supine to Sit     Supine to sit: Mod assist;HOB elevated     General bed mobility comments: modA for managment of LE off bed and trunk to upright as well as pad scoot of hips to EoB  Transfers Overall transfer level: Needs assistance Equipment used: Rolling walker (2 wheeled) Transfers: Sit to/from [facility] Sit to Stand: Mod assist;From elevated surface Stand pivot transfers: Mod assist       General transfer comment: modA for  power up and steadying with RW, modA for steadying with stand step pivot to chair, vc for sequencing and for remaining upright until in front of chair    Balance Overall balance assessment: Needs assistance Sitting-balance support: Feet supported;No upper extremity supported Sitting balance-Leahy Scale: Fair Sitting balance - Comments: pt required modA for stability initially;modA for stability as pt readjusted EOB to get feet on floor   Standing balance support: Bilateral upper extremity supported Standing balance-Leahy Scale: Poor Standing balance comment: requires RW assit and modA for stability and consistent VC for safe use of RW                           ADL either performed or assessed with clinical judgement   ADL Overall ADL's : Needs assistance/impaired Eating/Feeding: Set up   Grooming: Set up;Sitting   Upper Body Bathing: Set up;Sitting   Lower Body Bathing: Moderate assistance;Sit to/from stand   Upper Body Dressing : Set up;Sitting   Lower Body Dressing: Moderate assistance;Sit to/from stand Lower Body Dressing Details (indicate cue type and reason): pt unable to adjust socks while sitting EOB Toilet Transfer: Moderate assistance;Stand-pivot;RW Toilet Transfer Details (indicate cue type and reason): simulated with stand-pivot from EOB to recliner Toileting- Clothing Manipulation and Hygiene: Moderate assistance;+2 for physical assistance Toileting - Clothing Manipulation Details (indicate cue type and reason): 1 person for pt stability, 1 person for pericare;pt unable to let go of RW to complete pericare     Functional mobility during  ADLs: Moderate assistance;Rolling walker General ADL Comments: upon arrival pt reported codom catheter was leaking, bed significantly saturated with urine; nsg notified and removed catheter and provided pt with urinal;pt reports increased difficulty with ADLs     Vision         Perception     Praxis      Pertinent  Vitals/Pain Pain Assessment: No/denies pain(but reports tightness in BLE)     Hand Dominance Right   Extremity/Trunk Assessment Upper Extremity Assessment Upper Extremity Assessment: Generalized weakness   Lower Extremity Assessment Lower Extremity Assessment: Defer to PT evaluation   Cervical / Trunk Assessment Cervical / Trunk Assessment: Kyphotic   Communication Communication Communication: No difficulties   Cognition Arousal/Alertness: Awake/alert Behavior During Therapy: WFL for tasks assessed/performed Overall Cognitive Status: Within Functional Limits for tasks assessed                                     General Comments       Exercises     Shoulder Instructions      Home Living Family/patient expects to be discharged to:: Private residence Living Arrangements: Spouse/significant other Available Help at Discharge: Available 24 hours/day;Family Type of Home: House Home Access: Stairs to enter [employer] of Steps: 3 Entrance Stairs-Rails: Right Home Layout: One level     Bathroom Shower/Tub: Occupational psychologist: Handicapped height Bathroom Accessibility: No   Home Equipment: Grab bars - toilet;Grab bars - tub/shower;Shower seat - built in;Cane - single point;Walker - 4 wheels          Prior Functioning/Environment Level of Independence: Independent with assistive device(s)        Comments: limited community ambulation with cane;wife does majority of driving for community mobility         OT Problem List: Decreased strength;Decreased range of motion;Decreased activity tolerance;Impaired balance (sitting and/or standing);Decreased knowledge of use of DME or AE;Cardiopulmonary status limiting activity      OT Treatment/Interventions: Therapeutic exercise;Self-care/ADL training;Energy conservation;DME and/or AE instruction;Therapeutic activities;Patient/family education;Balance training    OT Goals(Current  goals can be found in the care plan section) Acute Rehab OT Goals Patient Stated Goal: to get stronger OT Goal Formulation: With patient Time For Goal Achievement: 02/13/18 Potential to Achieve Goals: Good  OT Frequency: Min 2X/week   Barriers to D/C:            Co-evaluation              AM-PAC PT "6 Clicks" Daily Activity     Outcome Measure Help from another person eating meals?: None Help from another person taking care of personal grooming?: A Little Help from another person toileting, which includes using toliet, bedpan, or urinal?: A Lot Help from another person bathing (including washing, rinsing, drying)?: A Lot Help from another person to put on and taking off regular upper body clothing?: A Little Help from another person to put on and taking off regular lower body clothing?: A Lot 6 Click Score: 16   End of Session Equipment Utilized During Treatment: Gait belt;Rolling walker Nurse Communication: Mobility status;Other (comment)(condom catheter leaking)  Activity Tolerance: Patient tolerated treatment well Patient left: in chair;with chair alarm set;with call bell/phone within reach  OT Visit Diagnosis: Unsteadiness on feet (R26.81);Other abnormalities of gait and mobility (R26.89);Muscle weakness (generalized) (M62.81)                Time: 1856-3149  OT Time Calculation (min): 31 min Charges:    G-Codes:     Dorinda Hill OTS    Dorinda Hill 01/30/2018, 12:43 PM

## 2018-01-31 LAB — HSV 2 ANTIBODY, IGG

## 2018-01-31 LAB — CULTURE, BLOOD (ROUTINE X 2)
Culture: NO GROWTH
Culture: NO GROWTH
SPECIAL REQUESTS: ADEQUATE
Special Requests: ADEQUATE

## 2018-01-31 LAB — COMPREHENSIVE METABOLIC PANEL
ALK PHOS: 71 U/L (ref 38–126)
ALT: 27 U/L (ref 0–44)
ANION GAP: 6 (ref 5–15)
AST: 41 U/L (ref 15–41)
Albumin: 2.1 g/dL — ABNORMAL LOW (ref 3.5–5.0)
BUN: 26 mg/dL — ABNORMAL HIGH (ref 8–23)
CALCIUM: 7.9 mg/dL — AB (ref 8.9–10.3)
CO2: 24 mmol/L (ref 22–32)
CREATININE: 1.68 mg/dL — AB (ref 0.61–1.24)
Chloride: 111 mmol/L (ref 98–111)
GFR, EST AFRICAN AMERICAN: 46 mL/min — AB (ref >60)
GFR, EST NON AFRICAN AMERICAN: 39 mL/min — AB (ref >60)
Glucose, Bld: 169 mg/dL — ABNORMAL HIGH (ref 70–99)
Potassium: 3.4 mmol/L — ABNORMAL LOW (ref 3.5–5.1)
SODIUM: 141 mmol/L (ref 135–145)
Total Bilirubin: 1.9 mg/dL — ABNORMAL HIGH (ref 0.3–1.2)
Total Protein: 5.7 g/dL — ABNORMAL LOW (ref 6.5–8.1)

## 2018-01-31 LAB — CBC
HCT: 27.7 % — ABNORMAL LOW (ref 39.0–52.0)
HEMOGLOBIN: 9.1 g/dL — AB (ref 13.0–17.0)
MCH: 35.7 pg — AB (ref 26.0–34.0)
MCHC: 32.9 g/dL (ref 30.0–36.0)
MCV: 108.6 fL — ABNORMAL HIGH (ref 78.0–100.0)
PLATELETS: 79 10*3/uL — AB (ref 150–400)
RBC: 2.55 MIL/uL — AB (ref 4.22–5.81)
RDW: 21.7 % — ABNORMAL HIGH (ref 11.5–15.5)
WBC: 3.5 10*3/uL — AB (ref 4.0–10.5)

## 2018-01-31 LAB — GLUCOSE, CAPILLARY
GLUCOSE-CAPILLARY: 143 mg/dL — AB (ref 70–99)
GLUCOSE-CAPILLARY: 182 mg/dL — AB (ref 70–99)
GLUCOSE-CAPILLARY: 340 mg/dL — AB (ref 70–99)
Glucose-Capillary: 293 mg/dL — ABNORMAL HIGH (ref 70–99)

## 2018-01-31 LAB — ANA: ANA: NEGATIVE

## 2018-01-31 LAB — HSV 1 ANTIBODY, IGG: HSV 1 Glycoprotein G Ab, IgG: 13.7 index — ABNORMAL HIGH (ref 0.00–0.90)

## 2018-01-31 LAB — MAGNESIUM: MAGNESIUM: 1.7 mg/dL (ref 1.7–2.4)

## 2018-01-31 MED ORDER — POTASSIUM CHLORIDE CRYS ER 20 MEQ PO TBCR
40.0000 meq | EXTENDED_RELEASE_TABLET | Freq: Two times a day (BID) | ORAL | Status: AC
  Administered 2018-01-31 – 2018-02-01 (×3): 40 meq via ORAL
  Filled 2018-01-31 (×3): qty 2

## 2018-01-31 MED ORDER — MAGNESIUM SULFATE 2 GM/50ML IV SOLN
2.0000 g | Freq: Once | INTRAVENOUS | Status: AC
  Administered 2018-01-31: 2 g via INTRAVENOUS
  Filled 2018-01-31: qty 50

## 2018-01-31 NOTE — Progress Notes (Signed)
[place] TEAM 1 - Stepdown/ICU TEAM  Bob Warren  BOF:[id number] DOB: Jan 06, 1946 DOA: 01/26/2018 PCP: [name], MD    Brief Narrative:  72yo male with a history of myasthenia gravis (IVIG every month - due next week) who was discharged from [facility] 2 days prior after tx for an acute GI bleed w/ pancytopenia.  He was instructed to discontinue Imuran.  He presented to the ER with weakness and difficulty ambulating.    In the ER patient was found to be tachycardic and febrile to 102 F.  CXR was unremarkable. UA was unremarkable.  Patient was noted to have an ulceration on his right foot which was red in color.  Labs revealed creatinine at baseline with elevated lactate level and pancytopenia.   Significant Events: 7/2 admit   Subjective: Reports frequent voluminous urination with adjustments in his diuretic.  Denies chest pain nausea or vomiting.  Feels that his abdomen is less distended and that his legs are less edematous.  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  LFTs have now returned to normal - holding spironolactone and nadolol - previous abdominal US showed cirrhosis with small ascites 01/14/2018 - acute hepatitis negative (01/13/2018) - had appointment with GI physician at [facility] but has been hospitalized - ANA negative -  anti-smooth muscle antibody, anti-mitochondrial ab both negative at [facility] 01/13/18 - anti-microsomal antibody and immunoglobulin levels pending - ?Imuran hepatotoxicity  Severe Peripheral edema / ascites secondary to third spacing from low albumin Albumin is significantly low - improving with diuresis thus  far [facility] Weights   01/29/18 0446 01/30/18 0500 01/31/18 0451  Weight: 106.8 kg (235 lb 7.2 oz) 106.4 kg (234 lb 9.1 oz) 107.5 kg (236 lb 15.9 oz)    Mild diastolic CHF EF preserved on TTE w/ grade 1 DD (01/27/18) - not likely clinically significant   Mild acute delirium  Resolved   Pancytopenia attributed to Imuran - now off Imuran - plt count stable   Chronic kidney disease stage II creatinine slowly climbing - follow trend w/ attempts to [place]  Lab 01/27/18 0332 01/28/18 0511 01/29/18 0343 01/30/18 0450 01/31/18 7782  CREATININE 1.43* 1.45* 1.57* 1.66* 1.68*   Diabetes mellitus type 2 CBG reasonably controlled  Hypertension Blood pressure controlled at this time    DVT prophylaxis: SCDs Code Status: DNR - NO CODE Family Communication: spoke w/ wife at bedside  Disposition Plan: SDU  Consultants:  Neruo  Antimicrobials:  Cefazolin 7/1  Cefepime 7/1 > Linezolid 7/1  Objective: Blood pressure 129/64, pulse 60, temperature 98.1 F (36.7 C), temperature source Oral, resp. rate (!) 22, height 5' 11"  (1.803 m), weight 107.5 kg (236 lb 15.9 oz), SpO2 100 %.  Intake/Output Summary (Last 24 hours) at 01/31/2018 1434 Last data filed at 01/31/2018 1000 Gross per 24 hour  Intake 3418.33 ml  Output 1200 ml  Net 2218.33 ml   Filed Weights   01/29/18 0446 01/30/18 0500 01/31/18 0451  Weight: 106.8 kg (235 lb 7.2 oz) 106.4 kg (234 lb 9.1 oz) 107.5 kg (236 lb 15.9 oz)    Examination: General: No acute respiratory distress at  rest in bed  Lungs: Clear to auscultation B - no wheezing  Cardiovascular: RRR w/o M  Abdomen: NT, soft, no rebound, obvious ascites but less pronounced today  Extremities: 2+ anasarca but slightly improved    CBC: Recent Labs  Lab 01/26/18 2033  01/27/18 0332 01/28/18 0511 01/29/18 0343 01/29/18 1337 01/30/18 0450 01/31/18 0608  WBC 2.3*   < > 2.2* 1.8* 1.8*  --  2.3* 3.5*  NEUTROABS 1.4*  --  1.0* 0.8*  --   --   --    --   HGB 8.8*   < > 8.6* 7.8* 6.8* 8.4* 9.2* 9.1*  HCT 26.7*   < > 26.5* 23.6* 20.8* 25.5* 27.8* 27.7*  MCV 115.1*   < > 117.3* 115.1* 114.9*  --  108.6* 108.6*  PLT 113*   < > 87* 88* 80*  --  81* 79*   < > = values in this interval not displayed.   Basic Metabolic Panel: Recent Labs  Lab 01/29/18 0343 01/29/18 0757 01/30/18 0450 01/31/18 0608  NA 140  --  142 141  K 3.1*  --  3.3* 3.4*  CL 111  --  113* 111  CO2 21*  --  23 24  GLUCOSE 170*  --  163* 169*  BUN 23  --  24* 26*  CREATININE 1.57*  --  1.66* 1.68*  CALCIUM 7.4*  --  7.8* 7.9*  MG  --  1.7 1.7 1.7   GFR: Estimated Creatinine Clearance: 50.3 mL/min (A) (by C-G formula based on SCr of 1.68 mg/dL (H)).  Liver Function Tests: Recent Labs  Lab 01/27/18 0332 01/28/18 0511 01/29/18 0343 01/30/18 0450 01/31/18 0608  AST 58* 53* 46*  --  41  ALT 53* 43 31  --  27  ALKPHOS 84 78 65  --  71  BILITOT 2.2* 1.9* 1.7* 2.0* 1.9*  PROT 6.1* 5.8* 5.5*  --  5.7*  ALBUMIN 1.8* 1.6* 2.1*  --  2.1*    Coagulation Profile: Recent Labs  Lab 01/26/18 2033 01/29/18 0343  INR 1.25 1.33    Cardiac Enzymes: Recent Labs  Lab 01/27/18 0402 01/27/18 0957  CKTOTAL 65  --   TROPONINI <0.03 <0.03    HbA1C: Hgb A1c MFr Bld  Date/Time Value Ref Range Status  07/03/2017 12:53 PM 7.7 (H) 4.8 - 5.6 % Final    Comment:             Prediabetes: 5.7 - 6.4          Diabetes: >6.4          Glycemic control for adults with diabetes: <7.0   04/01/2017 04:41 AM 7.4 (H) 4.8 - 5.6 % Final    Comment:    (NOTE) Pre diabetes:          5.7%-6.4% Diabetes:              >6.4% Glycemic control for   <7.0% adults with diabetes     Recent Results (from the past 240 hour(s))  Urine culture     Status: Abnormal   Collection Time: 01/26/18  8:24 PM  Result Value Ref Range Status   Specimen Description URINE, CLEAN CATCH  Final   Special Requests NONE  Final   Culture (A)  Final    <10,000 COLONIES/mL INSIGNIFICANT  GROWTH Performed at [facility], [address], [place], [place] [address]    Report Status 01/28/2018 FINAL  Final  Culture, blood (Routine x 2)  Status: None   Collection Time: 01/26/18  8:33 PM  Result Value Ref Range Status   Specimen Description BLOOD LEFT ANTECUBITAL  Final   Special Requests   Final    BOTTLES DRAWN AEROBIC AND ANAEROBIC Blood Culture adequate volume   Culture   Final    NO GROWTH 5 DAYS Performed at [facility], [address] [place], [place] [address]    Report Status 01/31/2018 FINAL  Final  Culture, blood (Routine x 2)     Status: None   Collection Time: 01/26/18  8:33 PM  Result Value Ref Range Status   Specimen Description BLOOD RIGHT FOREARM  Final   Special Requests   Final    BOTTLES DRAWN AEROBIC AND ANAEROBIC Blood Culture adequate volume   Culture   Final    NO GROWTH 5 DAYS Performed at [facility], [place] [address] [place], [place] [address]    Report Status 01/31/2018 FINAL  Final  MRSA PCR Screening     Status: None   Collection Time: 01/27/18  8:52 AM  Result Value Ref Range Status   MRSA by PCR NEGATIVE NEGATIVE Final    Comment:        The GeneXpert MRSA Assay (FDA approved for NASAL specimens only), is one component of a comprehensive MRSA colonization surveillance program. It is not intended to diagnose MRSA infection nor to guide or monitor treatment for MRSA infections. Performed at [facility], [place] [address], [place], [place] [address]      Scheduled Meds: . cholecalciferol  1,000 Units Oral Daily  . collagenase   Topical Daily  . diltiazem  120 mg Oral Daily  . furosemide  80 mg Intravenous Q12H  . insulin glargine  10 Units Subcutaneous QHS  . losartan  25 mg Oral Daily  . oxybutynin  5 mg Oral QHS  . pantoprazole  40 mg Oral BID   Continuous Infusions: . ceFEPime (MAXIPIME) IV 1 g (01/31/18 0923)     LOS: 5 days   Cherene Altes, MD [facility] Office   [phone] Pager - Text Page per Amion as per below:  On-Call/Text Page:      [email or web address]      password [email or web address]  If 7PM-7AM, please contact night-coverage [email or web address] Password [facility] 01/31/2018, 2:34 PM

## 2018-01-31 NOTE — Care Management Note (Signed)
Case Management Note  Patient Details  Name: Bob Warren MRN: [id number] Date of Birth: [birth year]  Subjective/Objective:        Patient DC'd a few days ago to home. PT OT recs for SNF as MD recs in note. Placed order for CSW to follow for placement. CM available for home DC planning if needed.            Action/Plan:   Expected Discharge Date:                  Expected Discharge Plan:  [place]  In-House Referral:     Discharge planning Services  CM Consult  Post Acute Care Choice:    Choice offered to:     DME Arranged:  (owns cane and walker) DME Agency:     HH Arranged:    HH Agency:     Status of Service:  In process, will continue to follow  If discussed at Long Length of Stay Meetings, dates discussed:    Additional Comments:  Carles Collet, RN 01/31/2018, 2:20 PM

## 2018-01-31 NOTE — Progress Notes (Signed)
Pt refusing bed alarm and chair alarm. Education provided by multiple staff members regarding reason for alarms in regards to pt safety but he still continues to refuse alarms to be activated. Education provided.

## 2018-01-31 NOTE — Progress Notes (Signed)
Pt remains with bradycardia (50-60's) HR as low as 39 while asleep, nonsustained. Pt arouses easily with HR returning to baseline, denies s/s. VSS. Meds reviewed, [place], NP made aware. Will monitor

## 2018-02-01 DIAGNOSIS — G7001 Myasthenia gravis with (acute) exacerbation: Secondary | ICD-10-CM

## 2018-02-01 DIAGNOSIS — R69 Illness, unspecified: Secondary | ICD-10-CM

## 2018-02-01 LAB — GLUCOSE, CAPILLARY: GLUCOSE-CAPILLARY: 228 mg/dL — AB (ref 70–99)

## 2018-02-01 LAB — CBC
HCT: 26.4 % — ABNORMAL LOW (ref 39.0–52.0)
HEMOGLOBIN: 8.8 g/dL — AB (ref 13.0–17.0)
MCH: 35.9 pg — ABNORMAL HIGH (ref 26.0–34.0)
MCHC: 33.3 g/dL (ref 30.0–36.0)
MCV: 107.8 fL — ABNORMAL HIGH (ref 78.0–100.0)
Platelets: 67 10*3/uL — ABNORMAL LOW (ref 150–400)
RBC: 2.45 MIL/uL — ABNORMAL LOW (ref 4.22–5.81)
RDW: 21 % — ABNORMAL HIGH (ref 11.5–15.5)
WBC: 3.8 10*3/uL — ABNORMAL LOW (ref 4.0–10.5)

## 2018-02-01 LAB — COMPREHENSIVE METABOLIC PANEL
ALK PHOS: 77 U/L (ref 38–126)
ALT: 23 U/L (ref 0–44)
AST: 51 U/L — ABNORMAL HIGH (ref 15–41)
Albumin: 1.9 g/dL — ABNORMAL LOW (ref 3.5–5.0)
Anion gap: 7 (ref 5–15)
BILIRUBIN TOTAL: 2 mg/dL — AB (ref 0.3–1.2)
BUN: 29 mg/dL — ABNORMAL HIGH (ref 8–23)
CO2: 26 mmol/L (ref 22–32)
CREATININE: 1.78 mg/dL — AB (ref 0.61–1.24)
Calcium: 7.9 mg/dL — ABNORMAL LOW (ref 8.9–10.3)
Chloride: 110 mmol/L (ref 98–111)
GFR calc non Af Amer: 37 mL/min — ABNORMAL LOW (ref >60)
GFR, EST AFRICAN AMERICAN: 43 mL/min — AB (ref >60)
Glucose, Bld: 198 mg/dL — ABNORMAL HIGH (ref 70–99)
Potassium: 3.2 mmol/L — ABNORMAL LOW (ref 3.5–5.1)
Sodium: 143 mmol/L (ref 135–145)
Total Protein: 6 g/dL — ABNORMAL LOW (ref 6.5–8.1)

## 2018-02-01 LAB — ANTI-MICROSOMAL ANTIBODY LIVER / KIDNEY: LKM1 Ab: 1.4 U (ref 0.0–20.0)

## 2018-02-01 LAB — ANTI-SMOOTH MUSCLE ANTIBODY, IGG: F-Actin IgG: 10 Units (ref 0–19)

## 2018-02-01 LAB — MAGNESIUM: MAGNESIUM: 2 mg/dL (ref 1.7–2.4)

## 2018-02-01 MED ORDER — POTASSIUM CHLORIDE CRYS ER 20 MEQ PO TBCR
40.0000 meq | EXTENDED_RELEASE_TABLET | Freq: Two times a day (BID) | ORAL | Status: DC
  Administered 2018-02-01 – 2018-02-02 (×2): 40 meq via ORAL
  Filled 2018-02-01 (×2): qty 2

## 2018-02-01 MED ORDER — FUROSEMIDE 40 MG PO TABS
60.0000 mg | ORAL_TABLET | Freq: Two times a day (BID) | ORAL | Status: AC
  Administered 2018-02-01: 60 mg via ORAL
  Filled 2018-02-01: qty 1

## 2018-02-01 NOTE — Progress Notes (Signed)
[place] TEAM 1 - Stepdown/ICU TEAM  Quay Simkin  FYB:[id number] DOB: 03-25-1946 DOA: 01/26/2018 PCP: [name], MD    Brief Narrative:  72yo male with a history of myasthenia gravis (IVIG every month - due next week) who was discharged from [facility] 2 days prior after tx for an acute GI bleed w/ pancytopenia.  He was instructed to discontinue Imuran.  He presented to the ER with weakness and difficulty ambulating.    In the ER patient was found to be tachycardic and febrile to 102 F.  CXR was unremarkable. UA was unremarkable.  Patient was noted to have an ulceration on his right foot which was red in color.  Labs revealed creatinine at baseline with elevated lactate level and pancytopenia.   Significant Events: 7/2 admit   Subjective: The patient's wife remains concerned about his edema.  The patient himself feels that it is slowly improving as do I.  He denies chest pain nausea vomiting or abdominal pain.  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  LFTs have now returned to normal - holding spironolactone and nadolol - previous abdominal US showed cirrhosis with small ascites 01/14/2018 - acute hepatitis negative (01/13/2018) - had appointment with GI physician at [facility] but has been hospitalized - ANA negative -  anti-smooth muscle antibody, anti-mitochondrial ab both negative at [facility] 01/13/18 - anti-microsomal antibody and immunoglobulin levels pending - ?Imuran hepatotoxicity  Severe Peripheral edema / ascites secondary to third spacing from low albumin Albumin is significantly low - improving with  diuresis thus far - must now slow diuresis given worsening renal function  Filed Weights   01/30/18 0500 01/31/18 0451 02/01/18 0404  Weight: 106.4 kg (234 lb 9.1 oz) 107.5 kg (236 lb 15.9 oz) 102.3 kg (225 lb 8.5 oz)    Mild diastolic CHF EF preserved on TTE w/ grade 1 DD (01/27/18) - not likely clinically significant   Mild acute delirium  Resolved   Pancytopenia attributed to Imuran - now off Imuran - plt count stable   Chronic kidney disease stage II creatinine slowly climbing - follow trend w/ attempts to [place]  Lab 01/28/18 0511 01/29/18 0343 01/30/18 0450 01/31/18 0608 02/01/18 0529  CREATININE 1.45* 1.57* 1.66* 1.68* 1.78*   Diabetes mellitus type 2 CBG poorly controlled - adjust treatment and follow  Hypertension Blood pressure controlled     DVT prophylaxis: SCDs Code Status: DNR - NO CODE Family Communication: spoke w/ wife at bedside  Disposition Plan: SDU  Consultants:  [place]  Antimicrobials:  Cefazolin 7/1  Cefepime 7/1 > 7/7 Linezolid 7/1  Objective: Blood pressure 125/73, pulse (!) 57, temperature 98.2 F (36.8 C), temperature source Oral, resp. rate (!) 21, height 5' 11"  (1.803 m), weight 102.3 kg (225 lb 8.5 oz), SpO2 99 %.  Intake/Output Summary (Last 24 hours) at 02/01/2018 1355 Last data filed at 02/01/2018 0839 Gross per 24 hour  Intake 575.96 ml  Output 2800 ml  Net -2224.04 ml   Filed Weights   01/30/18 0500 01/31/18 0451 02/01/18 0404  Weight: 106.4 kg (234 lb 9.1 oz) 107.5 kg (236  lb 15.9 oz) 102.3 kg (225 lb 8.5 oz)    Examination: General: No acute respiratory distress  Lungs: Clear to auscultation B without wheezing or crackles Cardiovascular: RRR w/o M  Abdomen: NT, soft, no rebound, obvious ascites but less pronounced again today  Extremities: 2+ anasarca but further improved    CBC: Recent Labs  Lab 01/26/18 2033  01/27/18 0332 01/28/18 0511  01/30/18 0450 01/31/18 0608 02/01/18 0529  WBC 2.3*    < > 2.2* 1.8*   < > 2.3* 3.5* 3.8*  NEUTROABS 1.4*  --  1.0* 0.8*  --   --   --   --   HGB 8.8*   < > 8.6* 7.8*   < > 9.2* 9.1* 8.8*  HCT 26.7*   < > 26.5* 23.6*   < > 27.8* 27.7* 26.4*  MCV 115.1*   < > 117.3* 115.1*   < > 108.6* 108.6* 107.8*  PLT 113*   < > 87* 88*   < > 81* 79* 67*   < > = values in this interval not displayed.   Basic Metabolic Panel: Recent Labs  Lab 01/30/18 0450 01/31/18 0608 02/01/18 0529  NA 142 141 143  K 3.3* 3.4* 3.2*  CL 113* 111 110  CO2 23 24 26   GLUCOSE 163* 169* 198*  BUN 24* 26* 29*  CREATININE 1.66* 1.68* 1.78*  CALCIUM 7.8* 7.9* 7.9*  MG 1.7 1.7 2.0   GFR: Estimated Creatinine Clearance: 46.4 mL/min (A) (by C-G formula based on SCr of 1.78 mg/dL (H)).  Liver Function Tests: Recent Labs  Lab 01/28/18 0511 01/29/18 0343 01/30/18 0450 01/31/18 0608 02/01/18 0529  AST 53* 46*  --  41 51*  ALT 43 31  --  27 23  ALKPHOS 78 65  --  71 77  BILITOT 1.9* 1.7* 2.0* 1.9* 2.0*  PROT 5.8* 5.5*  --  5.7* 6.0*  ALBUMIN 1.6* 2.1*  --  2.1* 1.9*    Coagulation Profile: Recent Labs  Lab 01/26/18 2033 01/29/18 0343  INR 1.25 1.33    Cardiac Enzymes: Recent Labs  Lab 01/27/18 0402 01/27/18 0957  CKTOTAL 65  --   TROPONINI <0.03 <0.03    HbA1C: Hgb A1c MFr Bld  Date/Time Value Ref Range Status  07/03/2017 12:53 PM 7.7 (H) 4.8 - 5.6 % Final    Comment:             Prediabetes: 5.7 - 6.4          Diabetes: >6.4          Glycemic control for adults with diabetes: <7.0   04/01/2017 04:41 AM 7.4 (H) 4.8 - 5.6 % Final    Comment:    (NOTE) Pre diabetes:          5.7%-6.4% Diabetes:              >6.4% Glycemic control for   <7.0% adults with diabetes     Recent Results (from the past 240 hour(s))  Urine culture     Status: Abnormal   Collection Time: 01/26/18  8:24 PM  Result Value Ref Range Status   Specimen Description URINE, CLEAN CATCH  Final   Special Requests NONE  Final   Culture (A)  Final    <10,000 COLONIES/mL  INSIGNIFICANT GROWTH Performed at [facility], [address], [place], [place] [address]    Report Status 01/28/2018 FINAL  Final  Culture, blood (Routine x 2)     Status:  None   Collection Time: 01/26/18  8:33 PM  Result Value Ref Range Status   Specimen Description BLOOD LEFT ANTECUBITAL  Final   Special Requests   Final    BOTTLES DRAWN AEROBIC AND ANAEROBIC Blood Culture adequate volume   Culture   Final    NO GROWTH 5 DAYS Performed at [facility], [address], [place], [place] [address]    Report Status 01/31/2018 FINAL  Final  Culture, blood (Routine x 2)     Status: None   Collection Time: 01/26/18  8:33 PM  Result Value Ref Range Status   Specimen Description BLOOD RIGHT FOREARM  Final   Special Requests   Final    BOTTLES DRAWN AEROBIC AND ANAEROBIC Blood Culture adequate volume   Culture   Final    NO GROWTH 5 DAYS Performed at [facility], [place] [address], [place], [place] [address]    Report Status 01/31/2018 FINAL  Final  MRSA PCR Screening     Status: None   Collection Time: 01/27/18  8:52 AM  Result Value Ref Range Status   MRSA by PCR NEGATIVE NEGATIVE Final    Comment:        The GeneXpert MRSA Assay (FDA approved for NASAL specimens only), is one component of a comprehensive MRSA colonization surveillance program. It is not intended to diagnose MRSA infection nor to guide or monitor treatment for MRSA infections. Performed at [facility], [place] [address], [place], [place] [address]      Scheduled Meds: . cholecalciferol  1,000 Units Oral Daily  . collagenase   Topical Daily  . diltiazem  120 mg Oral Daily  . furosemide  80 mg Intravenous Q12H  . insulin glargine  10 Units Subcutaneous QHS  . oxybutynin  5 mg Oral QHS  . pantoprazole  40 mg Oral BID   Continuous Infusions: . ceFEPime (MAXIPIME) IV 1 g (02/01/18 0859)     LOS: 6 days   Cherene Altes, MD [facility] Office  [phone] Pager -  Text Page per Amion as per below:  On-Call/Text Page:      [email or web address]      password [email or web address]  If 7PM-7AM, please contact night-coverage [email or web address] Password [email or web address] 02/01/2018, 1:55 PM

## 2018-02-02 LAB — BASIC METABOLIC PANEL
Anion gap: 7 (ref 5–15)
BUN: 28 mg/dL — ABNORMAL HIGH (ref 8–23)
CALCIUM: 8 mg/dL — AB (ref 8.9–10.3)
CO2: 26 mmol/L (ref 22–32)
CREATININE: 1.74 mg/dL — AB (ref 0.61–1.24)
Chloride: 110 mmol/L (ref 98–111)
GFR calc Af Amer: 44 mL/min — ABNORMAL LOW (ref >60)
GFR calc non Af Amer: 38 mL/min — ABNORMAL LOW (ref >60)
GLUCOSE: 171 mg/dL — AB (ref 70–99)
Potassium: 3 mmol/L — ABNORMAL LOW (ref 3.5–5.1)
Sodium: 143 mmol/L (ref 135–145)

## 2018-02-02 LAB — CBC
HCT: 26.4 % — ABNORMAL LOW (ref 39.0–52.0)
HEMOGLOBIN: 8.7 g/dL — AB (ref 13.0–17.0)
MCH: 35.5 pg — AB (ref 26.0–34.0)
MCHC: 33 g/dL (ref 30.0–36.0)
MCV: 107.8 fL — ABNORMAL HIGH (ref 78.0–100.0)
PLATELETS: 71 10*3/uL — AB (ref 150–400)
RBC: 2.45 MIL/uL — ABNORMAL LOW (ref 4.22–5.81)
RDW: 20.7 % — ABNORMAL HIGH (ref 11.5–15.5)
WBC: 4.6 10*3/uL (ref 4.0–10.5)

## 2018-02-02 LAB — IMMUNOGLOBULINS A/E/G/M, SERUM
IGA: 546 mg/dL — AB (ref 61–437)
IGM (IMMUNOGLOBULIN M), SRM: 54 mg/dL (ref 15–143)
IgE (Immunoglobulin E), Serum: 2560 IU/mL — ABNORMAL HIGH (ref 6–495)
IgG (Immunoglobin G), Serum: 1447 mg/dL (ref 700–1600)

## 2018-02-02 LAB — GLUCOSE, CAPILLARY: Glucose-Capillary: 279 mg/dL — ABNORMAL HIGH (ref 70–99)

## 2018-02-02 LAB — URIC ACID: URIC ACID, SERUM: 4.2 mg/dL (ref 3.7–8.6)

## 2018-02-02 MED ORDER — INSULIN GLARGINE 100 UNIT/ML ~~LOC~~ SOLN
14.0000 [IU] | Freq: Every day | SUBCUTANEOUS | Status: DC
  Administered 2018-02-02: 14 [IU] via SUBCUTANEOUS
  Filled 2018-02-02 (×2): qty 0.14

## 2018-02-02 MED ORDER — PANTOPRAZOLE SODIUM 40 MG PO TBEC
40.0000 mg | DELAYED_RELEASE_TABLET | Freq: Every day | ORAL | Status: DC
  Administered 2018-02-03: 40 mg via ORAL
  Filled 2018-02-02: qty 1

## 2018-02-02 MED ORDER — POTASSIUM CHLORIDE CRYS ER 20 MEQ PO TBCR
40.0000 meq | EXTENDED_RELEASE_TABLET | Freq: Three times a day (TID) | ORAL | Status: DC
  Administered 2018-02-02 – 2018-02-03 (×2): 40 meq via ORAL
  Filled 2018-02-02 (×2): qty 2

## 2018-02-02 MED ORDER — FUROSEMIDE 40 MG PO TABS: 60.0000 mg | ORAL_TABLET | Freq: Two times a day (BID) | ORAL | Status: DC

## 2018-02-02 MED ORDER — FUROSEMIDE 40 MG PO TABS
60.0000 mg | ORAL_TABLET | Freq: Two times a day (BID) | ORAL | Status: DC
  Administered 2018-02-02 – 2018-02-03 (×2): 60 mg via ORAL
  Filled 2018-02-02 (×2): qty 1

## 2018-02-02 NOTE — Progress Notes (Signed)
Physical Therapy Treatment Patient Details Name: Bob Warren MRN: [id number] DOB: 12-Jun-1946 Today's Date: 02/02/2018    History of Present Illness 72yo male with a history of myasthenia gravis (IVIG every month - due next week) who was discharged from [facility] 2 days prior after tx for an acute GI bleed w/ pancytopenia.  He was instructed to discontinue Imuran.  He presented to the ER with weakness and difficulty ambulating.  Dx with Sepsis due to R foot cellulitis    PT Comments    Patient seen for activity progression. Tolerated EOB activity but patient in significant pain in left ankle (non wound side). Patient unable to maintain weight or tolerate any movement/touch to left ankle due to extreme pain. Increased maximal assist for all aspects of mobility at this time. Spoke with family regarding concerns over new onset of pain in Left ankle.   Paged and spoke with MD regarding these concerns.   Educated family on ice/ elevation and avoidance of heat.   Current POC remains appropriate.  Follow Up Recommendations  SNF     Equipment Recommendations  Other (comment)(TBD at next venue)    Recommendations for Other Services OT consult     Precautions / Restrictions Precautions Precautions: Fall Restrictions Weight Bearing Restrictions: No    Mobility  Bed Mobility Overal bed mobility: Needs Assistance Bed Mobility: Supine to Sit;Sit to Supine     Supine to sit: Mod assist;HOB elevated Sit to supine: Max assist   General bed mobility comments: Moderate assist to elevate trunk  Transfers Overall transfer level: Needs assistance Equipment used: Rolling walker (2 wheeled) Transfers: Sit to/from Stand Sit to Stand: From elevated surface;Max assist         General transfer comment: Max assist with bed in highest position, max cues for positioning, placement and max physical assist to power up to standing. Extreme pain LLE (non wounded sided) and  limited function with pain in RLE. Unable to attempt pivot at this time.  Ambulation/Gait             General Gait Details: unable at this time   Stairs             Wheelchair Mobility    Modified Rankin (Stroke Patients Only)       Balance Overall balance assessment: Needs assistance Sitting-balance support: Feet supported;No upper extremity supported Sitting balance-Leahy Scale: Fair Sitting balance - Comments: able to sit EOB unsupported   Standing balance support: Bilateral upper extremity supported Standing balance-Leahy Scale: Zero                              Cognition Arousal/Alertness: Awake/alert Behavior During Therapy: WFL for tasks assessed/performed Overall Cognitive Status: Within Functional Limits for tasks assessed                                        Exercises      General Comments        Pertinent Vitals/Pain Pain Assessment: Faces Faces Pain Scale: Hurts worst Pain Location: bilateral ankles (Left >R) Pain Descriptors / Indicators: Sharp Pain Intervention(s): Monitored during session    Home Living Family/patient expects to be discharged to:: Private residence Living Arrangements: Spouse/significant other Available Help at Discharge: Available 24 hours/day;Family Type of Home: House Home Access: Stairs to enter Entrance Stairs-Rails: Right Home Layout: One  level Home Equipment: Grab bars - toilet;Grab bars - tub/shower;Shower seat - built in;Cane - single point;Walker - 4 wheels      Prior Function Level of Independence: Independent with assistive device(s)      Comments: limited community ambulation with cane;wife does majority of driving for community mobility    PT Goals (current goals can now be found in the care plan section) Acute Rehab PT Goals Patient Stated Goal: to get stronger PT Goal Formulation: With patient/family Time For Goal Achievement: 02/12/18 Potential to Achieve Goals:  Fair Progress towards PT goals: Progressing toward goals    Frequency    Min 2X/week      PT Plan Current plan remains appropriate    Co-evaluation              AM-PAC PT "6 Clicks" Daily Activity  Outcome Measure  Difficulty turning over in bed (including adjusting bedclothes, sheets and blankets)?: A Lot Difficulty moving from lying on back to sitting on the side of the bed? : Unable Difficulty sitting down on and standing up from a chair with arms (e.g., wheelchair, bedside commode, etc,.)?: Unable Help needed moving to and from a bed to chair (including a wheelchair)?: A Lot Help needed walking in hospital room?: Total Help needed climbing 3-5 steps with a railing? : Total 6 Click Score: 8    End of Session Equipment Utilized During Treatment: Gait belt Activity Tolerance: Patient limited by fatigue Patient left: in bed;with bed alarm set;with family/visitor present Nurse Communication: Mobility status PT Visit Diagnosis: Unsteadiness on feet (R26.81);Other abnormalities of gait and mobility (R26.89);Muscle weakness (generalized) (M62.81);Difficulty in walking, not elsewhere classified (R26.2)     Time: 2297-9892 PT Time Calculation (min) (ACUTE ONLY): 28 min  Charges:  $Therapeutic Activity: 23-37 mins                    G Codes:       Alben Deeds, PT DPT  Board Certified Neurologic Specialist [place] 02/02/2018, 5:15 PM

## 2018-02-02 NOTE — NC FL2 (Signed)
[place] LEVEL OF CARE SCREENING TOOL     IDENTIFICATION  Patient Name: Bob Warren Birthdate: March 30, 1946 Sex: male Admission Date (Current Location): 01/26/2018  [facility] and [place] Number:  [employer] and Address:  [facility] [place], [place] [address] [place], [place] [address]      Provider Number: [id number]  Attending Physician Name and Address:  Cherene Altes, MD  Relative Name and Phone Number:  Deatra James, [phone]    Current Level of Care: Hospital Recommended Level of Care: [place] Prior Approval Number:    Date Approved/Denied:   PASRR Number: [id number]  Discharge Plan: SNF    Current Diagnoses: Patient Active Problem List   Diagnosis Date Noted  . Cellulitis of right lower extremity   . Sepsis ([place]) 01/26/2018  . Diabetes mellitus type 2 in obese ([place]) 01/26/2018  . GIB (gastrointestinal bleeding) 01/13/2018  . Elevated liver enzymes   . Drug-induced pancytopenia ([place])   . Weakness 05/07/2017  . Myasthenia gravis with acute exacerbation ([place]) 05/07/2017  . Muscle weakness (generalized) 05/07/2017  . HLD (hyperlipidemia) 04/01/2017  . URI (upper respiratory infection) 04/01/2017  . Chronic back pain 04/01/2017  . Hypertension   . Myasthenia gravis ([place]) 03/27/2017  . DM (diabetes mellitus) ([place]) 03/27/2017    Orientation RESPIRATION BLADDER Height & Weight     Self, Time, Situation, Place  Normal Continent, External catheter Weight: 97.5 kg (214 lb 15.2 oz) Height:  5\' 11"  (180.3 cm)  BEHAVIORAL SYMPTOMS/MOOD NEUROLOGICAL BOWEL NUTRITION STATUS      Continent Diet(Please see DC Summary)  AMBULATORY STATUS COMMUNICATION OF NEEDS Skin   Limited Assist Verbally Other (Comment)(Non-pressure wound on foot)                       Personal Care Assistance Level of Assistance  Bathing, Feeding, Dressing Bathing Assistance: Limited assistance Feeding assistance:  Independent Dressing Assistance: Limited assistance     Functional Limitations Info  Sight, Hearing, Speech Sight Info: Impaired Hearing Info: Impaired Speech Info: Adequate    SPECIAL CARE FACTORS FREQUENCY  PT (By licensed PT), OT (By licensed OT)     PT Frequency: 5x/week OT Frequency: 3x/week            Contractures      Additional Factors Info  Code Status, Allergies Code Status Info: DNR Allergies Info: Imuran Azathioprine, Lisinopril           Current Medications (02/02/2018):  This is the current hospital active medication list Current Facility-Administered Medications  Medication Dose Route Frequency Provider Last Rate Last Dose  . acetaminophen (TYLENOL) tablet 500 mg  500 mg Oral Q6H PRN Cherene Altes, MD      . cholecalciferol (VITAMIN D) tablet 1,000 Units  1,000 Units Oral Daily Cherene Altes, MD   1,000 Units at 02/02/18 1046  . collagenase (SANTYL) ointment   Topical Daily Allie Bossier, MD      . diltiazem (CARDIZEM CD) 24 hr capsule 120 mg  120 mg Oral Daily Rise Patience, MD   120 mg at 02/02/18 1046  . insulin glargine (LANTUS) injection 10 Units  10 Units Subcutaneous QHS Rise Patience, MD   10 Units at 02/01/18 2208  . ondansetron (ZOFRAN) injection 4 mg  4 mg Intravenous Q6H PRN Allie Bossier, MD   4 mg at 01/29/18 0753  . oxybutynin (DITROPAN-XL) 24 hr tablet 5 mg  5 mg Oral  QHS Rise Patience, MD   5 mg at 02/01/18 2207  . pantoprazole (PROTONIX) EC tablet 40 mg  40 mg Oral BID Cherene Altes, MD   40 mg at 02/02/18 1046  . potassium chloride SA (K-DUR,KLOR-CON) CR tablet 40 mEq  40 mEq Oral BID Cherene Altes, MD   40 mEq at 02/02/18 1048  . traMADol (ULTRAM) tablet 50 mg  50 mg Oral Q6H PRN Cherene Altes, MD   50 mg at 02/01/18 1218     Discharge Medications: Please see discharge summary for a list of discharge medications.  Relevant Imaging Results:  Relevant Lab Results:   Additional  Information SSN: [place]  [place], [place]

## 2018-02-02 NOTE — Clinical Social Work Note (Signed)
Clinical Social Work Assessment  Patient Details  Name: Bob Warren MRN: [id number] Date of Birth: 1946-05-27  Date of referral:  02/02/18               Reason for consult:  Facility Placement                Permission sought to share information with:  Facility Sport and exercise psychologist, Family Supports Permission granted to share information::  Yes, Verbal Permission Granted  Name::     Medical sales representative::  SNfs  Relationship::  Spouse  Contact Information:  [phone]  Housing/Transportation Living arrangements for the past 2 months:  Single Family Home Source of Information:  Patient, Spouse Patient Interpreter Needed:  None Criminal Activity/Legal Involvement Pertinent to Current Situation/Hospitalization:  No - Comment as needed Significant Relationships:  Spouse Lives with:  Spouse Do you feel safe going back to the place where you live?  No Need for family participation in patient care:  No (Coment)  Care giving concerns:  CSW received consult for possible SNF placement at time of discharge. CSW spoke with patient and spouse regarding PT recommendation of SNF placement at time of discharge. Patient reported that patient's spouse is currently unable to care for patient at their home given patient's current physical needs and fall risk. Patient expressed understanding of PT recommendation and is agreeable to SNF placement at time of discharge. CSW to continue to follow and assist with discharge planning needs.   Social Worker assessment / plan:  CSW spoke with patient concerning possibility of rehab at [facility] before returning home.  Employment status:  Retired Nurse, adult PT Recommendations:  [place] / Referral to community resources:  [place]  Patient/Family's Response to care:  Patient recognizes need for rehab before returning home and is agreeable to a SNF. Patient reported preference for [facility], however they are currently full. Patient's spouse to select a second choice.   Patient/Family's Understanding of and Emotional Response to Diagnosis, Current Treatment, and Prognosis:  Patient/family is realistic regarding therapy needs and expressed being hopeful for SNF placement. Patient expressed understanding of CSW role and discharge process as well as medical condition. No questions/concerns about plan or treatment.    Emotional Assessment Appearance:  Appears stated age Attitude/Demeanor/Rapport:  Engaged, Gracious Affect (typically observed):  Accepting, Appropriate, Pleasant Orientation:  Oriented to Self, Oriented to Place, Oriented to  Time, Oriented to Situation Alcohol / Substance use:  Not Applicable Psych involvement (Current and /or in the community):  No (Comment)  Discharge Needs  Concerns to be addressed:  Care Coordination Readmission within the last 30 days:  Yes Current discharge risk:  None Barriers to Discharge:  Continued Medical Work up   [employer], [place] 02/02/2018, 1:22 PM

## 2018-02-02 NOTE — Progress Notes (Signed)
Bob Warren TEAM 1 - Stepdown/ICU TEAM  Asael Pann  NKN:[id number] DOB: 1946/01/24 DOA: 01/26/2018 PCP: [name], MD    Brief Narrative:  72yo male with a history of myasthenia gravis (IVIG every month - due next week) who was discharged from [facility] 2 days prior after tx for an acute GI bleed w/ pancytopenia.  He was instructed to discontinue Imuran.  He presented to the ER with weakness and difficulty ambulating.    In the ER patient was found to be tachycardic and febrile to 102 F.  CXR was unremarkable. UA was unremarkable.  Patient was noted to have an ulceration on his right foot which was red in color.  Labs revealed creatinine at baseline with elevated lactate level and pancytopenia.   Significant Events: 7/2 admit   Subjective: The patient is complaining of ongoing pain in both feet perhaps worse in the left ankle.  This is greatly exacerbated with weightbearing.  He denies fevers chills shortness of breath or chest pain.  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  LFTs have now returned to normal - holding spironolactone and nadolol - previous abdominal US showed cirrhosis with small ascites 01/14/2018 - acute hepatitis negative (01/13/2018) - had appointment with GI physician at [facility] but has been hospitalized - ANA negative -  anti-smooth muscle antibody, anti-mitochondrial ab both negative at [facility] 01/13/18 - anti-microsomal antibody negative this admission - ?Imuran hepatotoxicity -  discussed findings with patient and wife at bedside today and explained plan for outpatient follow-up - discussed possible component of NASH  Severe Peripheral edema / ascites secondary to third spacing from low albumin Albumin is significantly low - improving with diuresis thus far - net negative ~3L last 24hrs, but remains 3L+ since admit - cont diuresis as renal function will allow  Filed Weights   01/31/18 0451 02/01/18 0404 02/02/18 0500  Weight: 107.5 kg (236 lb 15.9 oz) 102.3 kg (225 lb 8.5 oz) 97.5 kg (214 lb 15.2 oz)    Mild diastolic CHF EF preserved on TTE w/ grade 1 DD (01/27/18) - not likely clinically significant   Mild acute delirium  Resolved   Pancytopenia attributed to Imuran - now off Imuran - plt count stable   Chronic kidney disease stage II creatinine stabilizing despite ongoing diuresis - cont and follow trend   Recent Labs  Lab 01/29/18 0343 01/30/18 0450 01/31/18 0608 02/01/18 0529 02/02/18 0424  CREATININE 1.57* 1.66* 1.68* 1.78* 1.74*   Diabetes mellitus type 2 CBG improved but not yet at goal - adjust tx again today and follow   Hypertension Blood pressure controlled     DVT prophylaxis: SCDs Code Status: DNR - NO CODE Family Communication: spoke w/ wife at bedside at length Disposition Plan: Transfer to [place] bed - awaiting SNF bed for discharge  Consultants:  Neruo  Antimicrobials:  Cefazolin 7/1  Cefepime 7/1 > 7/7 Linezolid 7/1  Objective: Blood pressure 114/61, pulse 70, temperature 98.7 F (37.1 C), temperature source Oral, resp. rate 18, height  5' 11"  (1.803 m), weight 97.5 kg (214 lb 15.2 oz), SpO2 98 %.  Intake/Output Summary (Last 24 hours) at 02/02/2018 1610 Last data filed at 02/02/2018 1049 Gross per 24 hour  Intake 420 ml  Output 2300 ml  Net -1880 ml   Filed Weights   01/31/18 0451 02/01/18 0404 02/02/18 0500  Weight: 107.5 kg (236 lb 15.9 oz) 102.3 kg (225 lb 8.5 oz) 97.5 kg (214 lb 15.2 oz)     Examination: General: No acute respiratory distress - alert and conversant Lungs: Clear to auscultation B without wheezing or crackles - distant breath sounds bilaterally Cardiovascular: RRR w/o M or rub Abdomen: NT, soft, no rebound, obvious ascites but again decreased in volume Extremities: 2+ anasarca but further improved again today   CBC: Recent Labs  Lab 01/26/18 2033  01/27/18 0332 01/28/18 0511  01/31/18 0608 02/01/18 0529 02/02/18 0424  WBC 2.3*   < > 2.2* 1.8*   < > 3.5* 3.8* 4.6  NEUTROABS 1.4*  --  1.0* 0.8*  --   --   --   --   HGB 8.8*   < > 8.6* 7.8*   < > 9.1* 8.8* 8.7*  HCT 26.7*   < > 26.5* 23.6*   < > 27.7* 26.4* 26.4*  MCV 115.1*   < > 117.3* 115.1*   < > 108.6* 107.8* 107.8*  PLT 113*   < > 87* 88*   < > 79* 67* 71*   < > = values in this interval not displayed.   Basic Metabolic Panel: Recent Labs  Lab 01/30/18 0450 01/31/18 0608 02/01/18 0529 02/02/18 0424  NA 142 141 143 143  K 3.3* 3.4* 3.2* 3.0*  CL 113* 111 110 110  CO2 23 24 26 26   GLUCOSE 163* 169* 198* 171*  BUN 24* 26* 29* 28*  CREATININE 1.66* 1.68* 1.78* 1.74*  CALCIUM 7.8* 7.9* 7.9* 8.0*  MG 1.7 1.7 2.0  --    GFR: Estimated Creatinine Clearance: 46.4 mL/min (A) (by C-G formula based on SCr of 1.74 mg/dL (H)).  Liver Function Tests: Recent Labs  Lab 01/28/18 0511 01/29/18 0343 01/30/18 0450 01/31/18 0608 02/01/18 0529  AST 53* 46*  --  41 51*  ALT 43 31  --  27 23  ALKPHOS 78 65  --  71 77  BILITOT 1.9* 1.7* 2.0* 1.9* 2.0*  PROT 5.8* 5.5*  --  5.7* 6.0*  ALBUMIN 1.6* 2.1*  --  2.1* 1.9*    Coagulation Profile: Recent Labs  Lab 01/26/18 2033 01/29/18 0343  INR 1.25 1.33    Cardiac Enzymes: Recent Labs  Lab 01/27/18 0402 01/27/18 0957  CKTOTAL 44  --   TROPONINI <0.03 <0.03    HbA1C: Hgb A1c MFr Bld  Date/Time Value Ref Range Status  07/03/2017 12:53 PM 7.7 (H) 4.8 - 5.6 % Final    Comment:             Prediabetes: 5.7 - 6.4          Diabetes:  >6.4          Glycemic control for adults with diabetes: <7.0   04/01/2017 04:41 AM 7.4 (H) 4.8 - 5.6 % Final    Comment:    (NOTE) Pre diabetes:          5.7%-6.4% Diabetes:              >6.4% Glycemic control for   <7.0% adults with diabetes     Recent Results (  from the past 240 hour(s))  Urine culture     Status: Abnormal   Collection Time: 01/26/18  8:24 PM  Result Value Ref Range Status   Specimen Description URINE, CLEAN CATCH  Final   Special Requests NONE  Final   Culture (A)  Final    <10,000 COLONIES/mL INSIGNIFICANT GROWTH Performed at [facility], [address] [place], [place] [address]    Report Status 01/28/2018 FINAL  Final  Culture, blood (Routine x 2)     Status: None   Collection Time: 01/26/18  8:33 PM  Result Value Ref Range Status   Specimen Description BLOOD LEFT ANTECUBITAL  Final   Special Requests   Final    BOTTLES DRAWN AEROBIC AND ANAEROBIC Blood Culture adequate volume   Culture   Final    NO GROWTH 5 DAYS Performed at [facility], [place] [address] [place], [place] [address]    Report Status 01/31/2018 FINAL  Final  Culture, blood (Routine x 2)     Status: None   Collection Time: 01/26/18  8:33 PM  Result Value Ref Range Status   Specimen Description BLOOD RIGHT FOREARM  Final   Special Requests   Final    BOTTLES DRAWN AEROBIC AND ANAEROBIC Blood Culture adequate volume   Culture   Final    NO GROWTH 5 DAYS Performed at [facility], [place] [address] [place], [place] [address]    Report Status 01/31/2018 FINAL  Final  MRSA PCR Screening     Status: None   Collection Time: 01/27/18  8:52 AM  Result Value Ref Range Status   MRSA by PCR NEGATIVE NEGATIVE Final    Comment:        The GeneXpert MRSA Assay (FDA approved for NASAL specimens only), is one component of a comprehensive MRSA colonization surveillance program. It is not intended to diagnose MRSA infection nor to guide or monitor treatment for MRSA  infections. Performed at [facility], [place] [address] [place], [place] [address]      Scheduled Meds: . cholecalciferol  1,000 Units Oral Daily  . collagenase   Topical Daily  . diltiazem  120 mg Oral Daily  . insulin glargine  10 Units Subcutaneous QHS  . oxybutynin  5 mg Oral QHS  . pantoprazole  40 mg Oral BID  . potassium chloride  40 mEq Oral BID      LOS: 7 days   Cherene Altes, MD [facility] Office  [phone] Pager - Text Page per Shea Evans as per below:  On-Call/Text Page:      [email or web address]      password [email or web address]  If 7PM-7AM, please contact night-coverage [email or web address] Password [email or web address] 02/02/2018, 4:10 PM

## 2018-02-03 LAB — CBC
HEMATOCRIT: 27.1 % — AB (ref 39.0–52.0)
HEMOGLOBIN: 8.8 g/dL — AB (ref 13.0–17.0)
MCH: 35.8 pg — ABNORMAL HIGH (ref 26.0–34.0)
MCHC: 32.5 g/dL (ref 30.0–36.0)
MCV: 110.2 fL — AB (ref 78.0–100.0)
Platelets: 69 10*3/uL — ABNORMAL LOW (ref 150–400)
RBC: 2.46 MIL/uL — ABNORMAL LOW (ref 4.22–5.81)
RDW: 20.6 % — ABNORMAL HIGH (ref 11.5–15.5)
WBC: 5.3 10*3/uL (ref 4.0–10.5)

## 2018-02-03 LAB — COMPREHENSIVE METABOLIC PANEL
ALBUMIN: 1.9 g/dL — AB (ref 3.5–5.0)
ALK PHOS: 85 U/L (ref 38–126)
ALT: 25 U/L (ref 0–44)
AST: 35 U/L (ref 15–41)
Anion gap: 9 (ref 5–15)
BUN: 26 mg/dL — ABNORMAL HIGH (ref 8–23)
CHLORIDE: 107 mmol/L (ref 98–111)
CO2: 25 mmol/L (ref 22–32)
CREATININE: 1.75 mg/dL — AB (ref 0.61–1.24)
Calcium: 8.1 mg/dL — ABNORMAL LOW (ref 8.9–10.3)
GFR calc Af Amer: 43 mL/min — ABNORMAL LOW (ref >60)
GFR calc non Af Amer: 37 mL/min — ABNORMAL LOW (ref >60)
GLUCOSE: 185 mg/dL — AB (ref 70–99)
Potassium: 3.1 mmol/L — ABNORMAL LOW (ref 3.5–5.1)
SODIUM: 141 mmol/L (ref 135–145)
Total Bilirubin: 1.8 mg/dL — ABNORMAL HIGH (ref 0.3–1.2)
Total Protein: 6.1 g/dL — ABNORMAL LOW (ref 6.5–8.1)

## 2018-02-03 LAB — PROTIME-INR
INR: 1.31
Prothrombin Time: 16.2 seconds — ABNORMAL HIGH (ref 11.4–15.2)

## 2018-02-03 LAB — GLUCOSE, CAPILLARY: GLUCOSE-CAPILLARY: 254 mg/dL — AB (ref 70–99)

## 2018-02-03 MED ORDER — INSULIN ASPART 100 UNIT/ML ~~LOC~~ SOLN: 0.0000 [IU] | Freq: Three times a day (TID) | SUBCUTANEOUS | 11 refills | Status: DC

## 2018-02-03 MED ORDER — ACETAMINOPHEN 500 MG PO TABS: 500.0000 mg | ORAL_TABLET | Freq: Four times a day (QID) | ORAL | 0 refills | Status: DC | PRN

## 2018-02-03 MED ORDER — DILTIAZEM HCL ER COATED BEADS 120 MG PO CP24: 120.0000 mg | ORAL_CAPSULE | Freq: Every day | ORAL | Status: DC

## 2018-02-03 MED ORDER — POTASSIUM CHLORIDE CRYS ER 20 MEQ PO TBCR: 40.0000 meq | EXTENDED_RELEASE_TABLET | Freq: Three times a day (TID) | ORAL | Status: DC

## 2018-02-03 MED ORDER — INSULIN ASPART 100 UNIT/ML ~~LOC~~ SOLN: 0.0000 [IU] | Freq: Every day | SUBCUTANEOUS | Status: DC

## 2018-02-03 MED ORDER — TRAMADOL HCL 50 MG PO TABS: 50.0000 mg | ORAL_TABLET | Freq: Four times a day (QID) | ORAL | 0 refills | Status: DC | PRN

## 2018-02-03 MED ORDER — INSULIN GLARGINE 100 UNIT/ML ~~LOC~~ SOLN
14.0000 [IU] | Freq: Every day | SUBCUTANEOUS | 11 refills | Status: DC
Start: 2018-02-03 — End: 2024-03-08

## 2018-02-03 MED ORDER — INSULIN ASPART 100 UNIT/ML ~~LOC~~ SOLN: 0.0000 [IU] | Freq: Every day | SUBCUTANEOUS | 11 refills | Status: DC

## 2018-02-03 MED ORDER — INSULIN ASPART 100 UNIT/ML ~~LOC~~ SOLN
0.0000 [IU] | Freq: Three times a day (TID) | SUBCUTANEOUS | Status: DC
  Administered 2018-02-03: 5 [IU] via SUBCUTANEOUS

## 2018-02-03 MED ORDER — FUROSEMIDE 20 MG PO TABS: 60.0000 mg | ORAL_TABLET | Freq: Two times a day (BID) | ORAL | Status: DC

## 2018-02-03 MED ORDER — COLLAGENASE 250 UNIT/GM EX OINT: TOPICAL_OINTMENT | Freq: Every day | CUTANEOUS | 0 refills | Status: DC

## 2018-02-03 NOTE — Progress Notes (Signed)
Bob Warren had a bed open up and will begin insurance authorization process. Patient's spouse updated.  Bob Warren [name] LCSW [phone]

## 2018-02-03 NOTE — Clinical Social Work Placement (Signed)
   CLINICAL SOCIAL WORK PLACEMENT  NOTE  Date:  02/03/2018  Patient Details  Name: Bob Warren MRN: [id number] Date of Birth: 22-Sep-1945  Clinical Social Work is seeking post-discharge placement for this patient at the [place] level of care (*CSW will initial, date and re-position this form in  chart as items are completed):  Yes   Patient/family provided with [place] Work Department's list of facilities offering this level of care within the geographic area requested by the patient (or if unable, by the patient's family).  Yes   Patient/family informed of their freedom to choose among providers that offer the needed level of care, that participate in Medicare, Medicaid or managed care program needed by the patient, have an available bed and are willing to accept the patient.  Yes   Patient/family informed of [facility]'s ownership interest in [facility] and [facility], as well as of the fact that they are under no obligation to receive care at these facilities.  PASRR submitted to EDS on 02/02/18     PASRR number received on 02/02/18     Existing PASRR number confirmed on       FL2 transmitted to all facilities in geographic area requested by pt/family on 02/02/18     FL2 transmitted to all facilities within larger geographic area on       Patient informed that his/her managed care company has contracts with or will negotiate with certain facilities, including the following:        Yes   Patient/family informed of bed offers received.  Patient chooses bed at [facility]     Physician recommends and patient chooses bed at      Patient to be transferred to [facility] on 02/03/18.  Patient to be transferred to facility by PTAR     Patient family notified on 02/03/18 of transfer.  Name of family member notified:  Spouse     PHYSICIAN       Additional Comment:     _______________________________________________ Benard Halsted, [place] 02/03/2018, 12:18 PM

## 2018-02-03 NOTE — Progress Notes (Signed)
Bob Warren to be D/C'd to [facility] per MD order. Report called to [facility] at [facility] [place]. tact without evidence of skin break down, no evidence of skin tears noted.  IV catheters discontinued intact. Sites without signs and symptoms of complications. Dressing and pressure applied.  An After Visit Summary was printed and given to the patient's wife and PTAR.  Patient escorted via stretcher.  Melonie Florida  02/03/2018 2:08 PM

## 2018-02-03 NOTE — Discharge Summary (Signed)
DISCHARGE SUMMARY  Bob Warren  MR#: [id number]  DOB:27-Feb-1946  Date of Admission: 01/26/2018 Date of Discharge: 02/03/2018  Attending Physician:Sophiah Rolin Hennie Duos, MD  Patient's MVE:HMCNOBS, [place], MD  Consults:  Neurology   Disposition: D/C to SNF for rehab stay   Follow-up Appts: Contact information for after-discharge care    [facility] .   Service:  Skilled Nursing Contact information: [address] [place] [phone]              Tests Needing Follow-up: -monitor renal function w/ ongoing diuresis  -titrate diuretic dose as renal function and volume status dictate -further titration of DM meds will be indicated  -follow K+ w/ supplementation and ongoing diuresis   Wound Care: Apply 1/4" thick layer of Santyl to the right dorsal foot wound, cover with NS moist 2x2 gauze. Secure with kerlix. Change daily.    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  He was instructed to discontinue Imuran.  He presented to the ER with weakness and difficulty ambulating.   In the ER patient was found to be tachycardic and febrile to 102 F. CXR was unremarkable. UA was unremarkable. Patient was noted to have an ulceration on his right foot which was red in color. Labs revealed creatinine at baseline with elevated lactate level and pancytopenia.   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  LFTs have returned to normal during this hospital stay - holding spironolactone and nadolol - previous abdominal US showed cirrhosis with small ascites 01/14/2018 - acute hepatitis negative (01/13/2018) - had appointment with GI physician at [facility] but has been hospitalized - ANA negative -  anti-smooth muscle antibody, anti-mitochondrial ab both negative at [facility] 01/13/18 - anti-microsomal antibody negative this admission - ?Imuran hepatotoxicity - discussed findings with patient and wife at bedside and explained need for further outpatient follow-up - discussed possible component of NASH  Severe Peripheral edema / ascites secondary to third spacing from low albumin Albumin is significantly low - improved with diuresis thus far - net negative ~1369m last 24hrs before d/c making him approx even for admit - cont diuresis as renal function will allow Filed Weights   02/01/18 0404 02/02/18 0500 02/03/18 0600  Weight: 102.3 kg (225 lb 8.5 oz) 97.5 kg (214 lb 15.2 oz) 97.6 kg (215 lb 2.7 oz)     Mild diastolic CHF EF preserved on TTE w/ grade 1 DD (01/27/18) - not clinically significant   Mild acute delirium  Resolved  Pancytopenia attributed to Imuran - now off Imuran - plt count stable   Chronic kidney disease stage II creatinine stabilizing despite ongoing diuresis - cont and follow trend   Recent Labs  Lab 01/30/18 0450 01/31/18 0608 02/01/18 0529 02/02/18 0424 02/03/18 0437  CREATININE 1.66* 1.68* 1.78*  1.74* 1.75*    Diabetes mellitus type 2 CBG improved not yet at goal at time of d/c - further titration of DM meds will be indicated   Hypertension Blood pressure controlled   Hypokalemia Due to diuretic use - cont to supplement and follow     Allergies as of 02/03/2018      Reactions   Imuran [azathioprine] Other (See Comments)   Abnormal LIver functional    Lisinopril Cough      Medication List    STOP taking these medications   aspirin EC 81 MG tablet   atorvastatin 20 MG tablet Commonly known as:  LIPITOR   diltiazem 120 MG 24 hr capsule Commonly known as:  DILACOR XR   losartan 25 MG tablet Commonly known as:  COZAAR   metFORMIN 850 MG tablet Commonly known as:  GLUCOPHAGE   nadolol 20 MG tablet Commonly known as:  CORGARD   PRESCRIPTION MEDICATION   ranitidine 150 MG tablet Commonly known as:  ZANTAC   silver sulfADIAZINE 1 % cream Commonly known as:  SILVADENE   spironolactone 25 MG tablet Commonly known as:  ALDACTONE     TAKE these medications   acetaminophen 500 MG tablet Commonly known as:  TYLENOL Take 1 tablet (500 mg total) by mouth every 6 (six) hours as needed for mild pain (or Fever >/= 101).   collagenase ointment Commonly known as:  SANTYL Apply topically daily.   diltiazem 120 MG 24 hr capsule Commonly known as:  CARDIZEM CD Take 1 capsule (120 mg total) by mouth daily. Start taking on:  02/04/2018   furosemide 20 MG tablet Commonly known as:  LASIX Take 3 tablets (60 mg total) by mouth 2 (two) times daily. What changed:    medication strength  how much to take  when to take this   insulin aspart 100 UNIT/ML injection Commonly known as:  novoLOG Inject 0-5 Units into the skin at bedtime.   insulin aspart 100 UNIT/ML injection Commonly known as:  novoLOG Inject 0-9 Units into the skin 3 (three) times daily with meals.   insulin glargine 100 UNIT/ML injection Commonly known as:  LANTUS Inject 0.14 mLs (14 Units  total) into the skin at bedtime. What changed:  how much to take   ondansetron 4 MG disintegrating tablet Commonly known as:  ZOFRAN ODT Take 1 tablet (4 mg total) by mouth every 8 (eight) hours as needed.   oxybutynin 5 MG 24 hr tablet Commonly known as:  DITROPAN-XL Take 1 tablet (5 mg total) by mouth at bedtime.   pantoprazole 40 MG tablet Commonly known as:  PROTONIX Take 1 tablet (40 mg total) by mouth daily.   potassium chloride SA 20 MEQ tablet Commonly known as:  K-DUR,KLOR-CON Take 2 tablets (40 mEq total) by mouth 3 (three) times daily.   traMADol 50 MG tablet Commonly known as:  ULTRAM Take 1 tablet (50 mg total) by mouth every 6 (six) hours as needed for moderate pain.   Vitamin D3 1000 units Caps Take 1,000 Units by mouth daily.       Day of Discharge BP 118/62 (BP Location: Right Arm)   Pulse (!) 52   Temp 97.9  F (36.6 C) (Oral)   Resp 17   Ht 5' 11"  (1.803 m)   Wt 97.6 kg (215 lb 2.7 oz)   SpO2 96%   BMI 30.01 kg/m   Physical Exam: General: No acute respiratory distress Lungs: Clear to auscultation bilaterally without wheezes or crackles Cardiovascular: Regular rate and rhythm without murmur gallop or rub normal S1 and S2 Abdomen: Nontender, nondistended, soft, bowel sounds positive, no rebound, no ascites, no appreciable mass Extremities: No significant cyanosis, clubbing, or edema bilateral lower extremities  Basic Metabolic Panel: Recent Labs  Lab 01/29/18 0757 01/30/18 0450 01/31/18 0608 02/01/18 0529 02/02/18 0424 02/03/18 0437  NA  --  142 141 143 143 141  K  --  3.3* 3.4* 3.2* 3.0* 3.1*  CL  --  113* 111 110 110 107  CO2  --  23 24 26 26 25   GLUCOSE  --  163* 169* 198* 171* 185*  BUN  --  24* 26* 29* 28* 26*  CREATININE  --  1.66* 1.68* 1.78* 1.74* 1.75*  CALCIUM  --  7.8* 7.9* 7.9* 8.0* 8.1*  MG 1.7 1.7 1.7 2.0  --   --     Liver Function Tests: Recent Labs  Lab 01/28/18 0511 01/29/18 0343 01/30/18 0450 01/31/18 0608  02/01/18 0529 02/03/18 0437  AST 53* 46*  --  41 51* 35  ALT 43 31  --  27 23 25   ALKPHOS 78 65  --  71 77 85  BILITOT 1.9* 1.7* 2.0* 1.9* 2.0* 1.8*  PROT 5.8* 5.5*  --  5.7* 6.0* 6.1*  ALBUMIN 1.6* 2.1*  --  2.1* 1.9* 1.9*    Recent Labs  Lab 01/29/18 0343  AMMONIA 61*    Coags: Recent Labs  Lab 01/29/18 0343 02/03/18 0437  INR 1.33 1.31   CBC: Recent Labs  Lab 01/28/18 0511  01/30/18 0450 01/31/18 0608 02/01/18 0529 02/02/18 0424 02/03/18 0437  WBC 1.8*   < > 2.3* 3.5* 3.8* 4.6 5.3  NEUTROABS 0.8*  --   --   --   --   --   --   HGB 7.8*   < > 9.2* 9.1* 8.8* 8.7* 8.8*  HCT 23.6*   < > 27.8* 27.7* 26.4* 26.4* 27.1*  MCV 115.1*   < > 108.6* 108.6* 107.8* 107.8* 110.2*  PLT 88*   < > 81* 79* 67* 71* 69*   < > = values in this interval not displayed.    CBG: Recent Labs  Lab 01/31/18 1211 01/31/18 1721 01/31/18 2031 02/01/18 2209 02/02/18 2104  GLUCAP 182* 293* 340* 228* 279*    Recent Results (from the past 240 hour(s))  Urine culture     Status: Abnormal   Collection Time: 01/26/18  8:24 PM  Result Value Ref Range Status   Specimen Description URINE, CLEAN CATCH  Final   Special Requests NONE  Final   Culture (A)  Final    <10,000 COLONIES/mL INSIGNIFICANT GROWTH Performed at [facility], [place] [address] [place], [place] [address]    Report Status 01/28/2018 FINAL  Final  Culture, blood (Routine x 2)     Status: None   Collection Time: 01/26/18  8:33 PM  Result Value Ref Range Status   Specimen Description BLOOD LEFT ANTECUBITAL  Final   Special Requests   Final    BOTTLES DRAWN AEROBIC AND ANAEROBIC Blood Culture adequate volume   Culture   Final    NO GROWTH 5 DAYS Performed at  [facility], [place] [address] [place], [place] [address]    Report Status 01/31/2018 FINAL  Final  Culture, blood (Routine x 2)     Status: None   Collection Time: 01/26/18  8:33 PM  Result Value Ref Range Status   Specimen Description BLOOD RIGHT  FOREARM  Final   Special Requests   Final    BOTTLES DRAWN AEROBIC AND ANAEROBIC Blood Culture adequate volume   Culture   Final    NO GROWTH 5 DAYS Performed at [facility], [place] [address], [place], [place] [address]    Report Status 01/31/2018 FINAL  Final  MRSA PCR Screening     Status: None   Collection Time: 01/27/18  8:52 AM  Result Value Ref Range Status   MRSA by PCR NEGATIVE NEGATIVE Final    Comment:        The GeneXpert MRSA Assay (FDA approved for NASAL specimens only), is one component of a comprehensive MRSA colonization surveillance program. It is not intended to diagnose MRSA infection nor to guide or monitor treatment for MRSA infections. Performed at [facility], [place] [address] [place], [place] [address]      Time spent in discharge (includes decision making & examination of pt): 35 minutes  02/03/2018, 11:51 AM   Cherene Altes, MD [facility] Office  [phone] Pager [phone]  On-Call/Text Page:      [email or web address]      password [facility]

## 2018-02-03 NOTE — Progress Notes (Signed)
Inpatient Diabetes Program Recommendations  AACE/ADA: New Consensus Statement on Inpatient Glycemic Control (2019)  Target Ranges:  Prepandial:   less than 140 mg/dL      Peak postprandial:   less than 180 mg/dL (1-2 hours)      Critically ill patients:  140 - 180 mg/dL   Results for ARBEN, PACKMAN (MRN [id number]) as of 02/03/2018 10:57  Ref. Range 01/31/2018 08:04 01/31/2018 12:11 01/31/2018 17:21 01/31/2018 20:31 02/01/2018 22:09 02/02/2018 21:04  Glucose-Capillary Latest Ref Range: 70 - 99 mg/dL 143 (H) 182 (H) 293 (H) 340 (H) 228 (H) 279 (H)   Review of Glycemic Control  Outpatient Diabetes medications: Lantus 20 units QHS, Metformin 850 mg BID Current orders for Inpatient glycemic control: Lantus 14 units QHS  Inpatient Diabetes Program Recommendations: Correction (SSI): Please consider ordering Novolog 0-9 units TID with meals and Novolog 0-5 units QHS for bedtime correction scale.  Thanks, Barnie Alderman, RN, MSN, CDE Diabetes Coordinator Inpatient Diabetes Program [phone] (Team Pager from 8am to 5pm)

## 2018-02-03 NOTE — Progress Notes (Signed)
Patient will DC to: [facility] Anticipated DC date: 02/03/18 Family notified: Spouse Transport by: PTAR 1:30pm   Per MD patient ready for DC to [facility]. RN, patient, patient's family, and facility notified of DC. Discharge Summary sent to facility. RN given number for report. DC packet on chart. Ambulance transport requested for patient.   CSW signing off.  Cedric Fishman, LCSW Clinical Social Worker [phone]

## 2018-02-03 NOTE — Progress Notes (Signed)
Occupational Therapy Treatment Patient Details Name: Bob Warren MRN: [id number] DOB: June 24, 1946 Today's Date: 02/03/2018    History of present illness 72yo male with a history of myasthenia gravis (IVIG every month - due next week) who was discharged from [facility] 2 days prior after tx for an acute GI bleed w/ pancytopenia.  He was instructed to discontinue Imuran.  He presented to the ER with weakness and difficulty ambulating.  Dx with Sepsis due to R foot cellulitis   OT comments  Patient with limited progress with OT due to limitations of pain in B feet/ankles, reporting R > L today.  Required max assist +2 to complete sit to stand, but unable to complete stand pivot transfer; minA +2 safety only to complete lateral scoot toward recliner (drop arm) towards R side.  He was educated on safety, energy conservation, and compensatory techniques for increased participation in ADLs.  Continue to recommend SNF for short term rehab in order to maximize independence with mobility and ADLs, safety prior to dc home with spouse support.    Follow Up Recommendations  SNF;Supervision/Assistance - 24 hour    Equipment Recommendations  None recommended by OT    Recommendations for Other Services      Precautions / Restrictions Precautions Precautions: Fall       Mobility Bed Mobility Overal bed mobility: Needs Assistance Bed Mobility: Supine to Sit     Supine to sit: Mod assist;HOB elevated     General bed mobility comments: assist to elevate trunk   Transfers Overall transfer level: Needs assistance Equipment used: None Transfers: Sit to/from Stand;Lateral/Scoot Transfers Sit to Stand: From elevated surface;Max assist;+2 physical assistance;+2 safety/equipment        Lateral/Scoot Transfers: Min guard;+2 safety/equipment;From elevated surface General transfer comment: sit to stand with +2 max assist bed elevated to power up to stand given max cueing for  technique, patient with increased pain and unable to pivot or complete step towards recliner; positioned to complete lateral scoot, increased time and effort but only min physical assistance +2 for safety to recliner     Balance Overall balance assessment: Needs assistance Sitting-balance support: Feet supported;No upper extremity supported Sitting balance-Leahy Scale: Fair Sitting balance - Comments: able to sit EOB unsupported   Standing balance support: Bilateral upper extremity supported Standing balance-Leahy Scale: Zero Standing balance comment: requires B UE support and external support, limited balance and tolerance due to pain B feet                           ADL either performed or assessed with clinical judgement   ADL Overall ADL's : Needs assistance/impaired                     Lower Body Dressing: Total assistance;+2 for physical assistance;+2 for safety/equipment;Sit to/from stand Lower Body Dressing Details (indicate cue type and reason): unable to reach feet, manage clothing or stand without 2+ assist Toilet Transfer: Minimal assistance;+2 for safety/equipment;Cueing for safety;Cueing for sequencing(lateral scoot, towards R side; simulated to recliner ) Toilet Transfer Details (indicate cue type and reason): unable to complete stand pivot due to pain B feet, patient completed lateral scoot with 2+ assist         Functional mobility during ADLs: Minimal assistance;+2 for safety/equipment(lateral scoot only, unable to complete stand pivot today) General ADL Comments: Patient educated on energy conservation techniques with ADL and IADL participation, provided written handout to review.  Vision       Perception     Praxis      Cognition Arousal/Alertness: Awake/alert Behavior During Therapy: WFL for tasks assessed/performed Overall Cognitive Status: Within Functional Limits for tasks assessed                                           Exercises     Shoulder Instructions       General Comments      Pertinent Vitals/ Pain       Pain Assessment: Faces Faces Pain Scale: Hurts whole lot Pain Location: B ankles (R>L) Pain Descriptors / Indicators: Sharp;Discomfort;Grimacing Pain Intervention(s): Monitored during session;Repositioned  Home Living                                          Prior Functioning/Environment              Frequency  Min 2X/week        Progress Toward Goals  OT Goals(current goals can now be found in the care plan section)  Progress towards OT goals: Progressing toward goals  Acute Rehab OT Goals Patient Stated Goal: to get stronger OT Goal Formulation: With patient Time For Goal Achievement: 02/13/18 Potential to Achieve Goals: Good  Plan Discharge plan remains appropriate;Frequency remains appropriate    Co-evaluation                 AM-PAC PT "6 Clicks" Daily Activity     Outcome Measure   Help from another person eating meals?: None Help from another person taking care of personal grooming?: A Little Help from another person toileting, which includes using toliet, bedpan, or urinal?: Total Help from another person bathing (including washing, rinsing, drying)?: A Lot Help from another person to put on and taking off regular upper body clothing?: A Little Help from another person to put on and taking off regular lower body clothing?: Total 6 Click Score: 14    End of Session Equipment Utilized During Treatment: Gait belt;Rolling walker  OT Visit Diagnosis: Unsteadiness on feet (R26.81);Other abnormalities of gait and mobility (R26.89);Muscle weakness (generalized) (M62.81);Pain Pain - Right/Left: Right Pain - part of body: Ankle and joints of foot   Activity Tolerance Patient tolerated treatment well   Patient Left in chair;with call bell/phone within reach;with chair alarm set   Nurse Communication Mobility  status;Precautions        Time: 1055-1130 OT Time Calculation (min): 35 min  Charges: OT General Charges $OT Visit: 1 Visit OT Treatments $Self Care/Home Management : 8-22 mins $Therapeutic Activity: 8-22 mins  Delight Stare, OTR/L  Pager [employer] 02/03/2018, 11:43 AM

## 2018-02-03 NOTE — Discharge Instructions (Signed)

## 2018-02-05 ENCOUNTER — Ambulatory Visit: Payer: Medicare Other | Admitting: Gastroenterology

## 2018-02-10 ENCOUNTER — Telehealth: Payer: Self-pay | Admitting: *Deleted

## 2018-02-10 DIAGNOSIS — G7000 Myasthenia gravis without (acute) exacerbation: Secondary | ICD-10-CM

## 2018-02-10 MED ORDER — OCTAGAM 10 GM/100ML IV SOLN: INTRAVENOUS | 0 refills | Status: DC

## 2018-02-10 NOTE — Telephone Encounter (Signed)
Dr. Felecia Shelling has authorized IVIG orders in Dr. Rhea Belton absence from the office.  They have been placed in Epic and the patient's information has also been faxed to [facility] at [phone].   Additionally, a copy of his insurance approval has also been provided to [facility] (approval valid through 09/24/2018).

## 2018-02-10 NOTE — Addendum Note (Signed)
Addended by: Noberto Retort C on: 02/10/2018 05:12 PM   Modules accepted: Orders

## 2018-02-10 NOTE — Telephone Encounter (Addendum)
I have spoken with his wife, Bob Warren (on [place]).  He has been in the hospital and has now been transferred to [facility] in [place] for rehab.  He is receiving physical therapy and will be there for several weeks.  He has been getting home IVIG infusions, by [facility], every 28 days.  He had a half dose, while in the hospital, on 01/29/18.  He is now coming due for his full dose repeat infusion.  Since he is in a facility, he will need to get this infusion at [facility].  [facility] is able to provide transportation.  His wife would like the appointment to be schedule through her.  Her cell # is [phone] and home # [phone].  He has been getting the following: Maintenance dose: IVIG 90 grams total IV dose administered in divided doses over two days every 28 days.  Premedicate with the following: 1) Diphenhydramine 67m IV x 1.  May repeat q6h prn. 2) Acetaminophen 6539mPO x 1.  May repeat q4h prn.

## 2018-02-10 NOTE — Telephone Encounter (Addendum)
Received fax confirmation.  I could not get the orders placed in Epic.  Signed MD orders were faxed and confirmed to [facility].

## 2018-02-12 ENCOUNTER — Telehealth: Payer: Self-pay | Admitting: Oncology

## 2018-02-12 ENCOUNTER — Other Ambulatory Visit (HOSPITAL_COMMUNITY): Payer: Self-pay | Admitting: General Practice

## 2018-02-12 ENCOUNTER — Other Ambulatory Visit: Payer: Self-pay | Admitting: *Deleted

## 2018-02-12 ENCOUNTER — Encounter: Payer: Self-pay | Admitting: *Deleted

## 2018-02-12 DIAGNOSIS — D61811 Other drug-induced pancytopenia: Secondary | ICD-10-CM

## 2018-02-12 NOTE — Telephone Encounter (Signed)
Left pt VM to contact office to schedule appt from scheduling message on 02/12/18.

## 2018-02-12 NOTE — Addendum Note (Signed)
Addended by: Noberto Retort C on: 02/12/2018 04:45 PM   Modules accepted: Orders

## 2018-02-13 ENCOUNTER — Telehealth: Payer: Self-pay | Admitting: *Deleted

## 2018-02-13 ENCOUNTER — Other Ambulatory Visit (HOSPITAL_COMMUNITY): Payer: Self-pay | Admitting: General Practice

## 2018-02-13 ENCOUNTER — Telehealth: Payer: Self-pay | Admitting: Oncology

## 2018-02-13 NOTE — Telephone Encounter (Signed)
Attempted to contact pt again this am with no answer. VM was left with instructions for pt to contact office to get lab\MD appt scheduled for next week.

## 2018-02-13 NOTE — Telephone Encounter (Signed)
Thank you :)

## 2018-02-13 NOTE — Telephone Encounter (Signed)
Voicemail received requesting to reschedule Monday's appointment to July 24th.  [facility] notified appointment with [facility].  Medical notified of request.

## 2018-02-13 NOTE — Telephone Encounter (Signed)
LMOM for Bob Warren at [place] short stay--Michelle tried for about an hour yesterday to figure out why they couldn't see IVIG orders in Epic.  She eventually faxed IVIG orders to [facility] and received fax confirmation.  I have refaxed those orders this morning and lmom for [facility] to call me if she doesn't get them/fim

## 2018-02-13 NOTE — Telephone Encounter (Signed)
Please call Dee/[facility] [phone] she said orders are not in Epic for IVIG and on order sheet rec'd they are not on there at all.

## 2018-02-16 ENCOUNTER — Ambulatory Visit (HOSPITAL_COMMUNITY)
Admission: RE | Admit: 2018-02-16 | Discharge: 2018-02-16 | Disposition: A | Discharge status: Home / Self Care | Payer: Medicare Other | Source: Ambulatory Visit | Attending: Neurology | Admitting: Neurology

## 2018-02-16 ENCOUNTER — Encounter (HOSPITAL_COMMUNITY): Payer: Self-pay

## 2018-02-16 DIAGNOSIS — G7000 Myasthenia gravis without (acute) exacerbation: Secondary | ICD-10-CM | POA: Insufficient documentation

## 2018-02-16 MED ORDER — DIPHENHYDRAMINE HCL 50 MG/ML IJ SOLN
25.0000 mg | Freq: Once | INTRAMUSCULAR | Status: AC
  Administered 2018-02-16: 25 mg via INTRAVENOUS
  Filled 2018-02-16: qty 1

## 2018-02-16 MED ORDER — DEXTROSE 5 % IV SOLN
INTRAVENOUS | Status: DC
  Administered 2018-02-16: 10:00:00 via INTRAVENOUS

## 2018-02-16 MED ORDER — IMMUNE GLOBULIN (HUMAN) 5 GM/50ML IV SOLN
45.0000 g | INTRAVENOUS | Status: DC
  Administered 2018-02-16: 45 g via INTRAVENOUS
  Filled 2018-02-16 (×2): qty 50

## 2018-02-16 MED ORDER — ACETAMINOPHEN 325 MG PO TABS
650.0000 mg | ORAL_TABLET | Freq: Once | ORAL | Status: AC
  Administered 2018-02-16: 650 mg via ORAL
  Filled 2018-02-16: qty 2

## 2018-02-16 NOTE — Discharge Instructions (Signed)
Immune Globulin Injection / Privigen What is this medicine? IMMUNE GLOBULIN (im MUNE GLOB yoo lin) helps to prevent or reduce the severity of certain infections in patients who are at risk. This medicine is collected from the pooled blood of many donors. It is used to treat immune system problems, thrombocytopenia, and Kawasaki syndrome. This medicine may be used for other purposes; ask your health care provider or pharmacist if you have questions. COMMON BRAND NAME(S): Baygam, BIVIGAM, Carimune, Carimune NF, Cuvitru, Flebogamma, Flebogamma DIF, GamaSTAN S/D, Gamimune N, Gammagard, Gammagard S/D, Gammaked, Gammaplex, Gammar-P IV, Gamunex, Gamunex-C, Hizentra, Iveegam, Iveegam EN, Octagam, Panglobulin, Panglobulin NF, Polygam S/D, Privigen, Sandoglobulin, Venoglobulin-S, Vigam, Vivaglobulin What should I tell my health care provider before I take this medicine? They need to know if you have any of these conditions: - diabetes - extremely low or no immune antibodies in the blood - heart disease - history of blood clots - hyperprolinemia - infection in the blood, sepsis - kidney disease - taking medicine that may change kidney function - ask your health care provider about your medicine - an unusual or allergic reaction to human immune globulin, albumin, maltose, sucrose, polysorbate 80, other medicines, foods, dyes, or preservatives - pregnant or trying to get pregnant - breast-feeding How should I use this medicine? This medicine is for injection into a muscle or infusion into a vein or skin. It is usually given by a health care professional in a hospital or clinic setting. In rare cases, some brands of this medicine might be given at home. You will be taught how to give this medicine. Use exactly as directed. Take your medicine at regular intervals. Do not take your medicine more often than directed. Talk to your pediatrician regarding the use of this medicine in children. Special  care may be needed. Overdosage: If you think you have taken too much of this medicine contact a poison control center or emergency room at once. NOTE: This medicine is only for you. Do not share this medicine with others. What if I miss a dose? It is important not to miss your dose. Call your doctor or health care professional if you are unable to keep an appointment. If you give yourself the medicine and you miss a dose, take it as soon as you can. If it is almost time for your next dose, take only that dose. Do not take double or extra doses. What may interact with this medicine? -aspirin and aspirin-like medicines -cisplatin -cyclosporine -medicines for infection like acyclovir, adefovir, amphotericin B, bacitracin, cidofovir, foscarnet, ganciclovir, gentamicin, pentamidine, vancomycin -NSAIDS, medicines for pain and inflammation, like ibuprofen or naproxen -pamidronate -vaccines -zoledronic acid This list may not describe all possible interactions. Give your health care provider a list of all the medicines, herbs, non-prescription drugs, or dietary supplements you use. Also tell them if you smoke, drink alcohol, or use illegal drugs. Some items may interact with your medicine. What should I watch for while using this medicine? Your condition will be monitored carefully while you are receiving this medicine. This medicine is made from pooled blood donations of many different people. It may be possible to pass an infection in this medicine. However, the donors are screened for infections and all products are tested for HIV and hepatitis. The medicine is treated to kill most or all bacteria and viruses. Talk to your doctor about the risks and benefits of this medicine. Do not have vaccinations for at least 14 days before, or until at least 3  months after receiving this medicine. What side effects may I notice from receiving this medicine? Side effects that you should report to your doctor or  health care professional as soon as possible: -allergic reactions like skin rash, itching or hives, swelling of the face, lips, or tongue -breathing problems -chest pain or tightness -fever, chills -headache with nausea, vomiting -neck pain or difficulty moving neck -pain when moving eyes -pain, swelling, warmth in the leg -problems with balance, talking, walking -sudden weight gain -swelling of the ankles, feet, hands -trouble passing urine or change in the amount of urine Side effects that usually do not require medical attention (report to your doctor or health care professional if they continue or are bothersome): -dizzy, drowsy -flushing -increased sweating -leg cramps -muscle aches and pains -pain at site where injected This list may not describe all possible side effects. Call your doctor for medical advice about side effects. You may report side effects to FDA at 1-800-FDA-1088. Where should I keep my medicine? Keep out of the reach of children. This drug is usually given in a hospital or clinic and will not be stored at home. In rare cases, some brands of this medicine may be given at home. If you are using this medicine at home, you will be instructed on how to store this medicine. Throw away any unused medicine after the expiration date on the label. NOTE: This sheet is a summary. It may not cover all possible information. If you have questions about this medicine, talk to your doctor, pharmacist, or health care provider.  2018 Elsevier/Gold Standard (2008-10-05 11:44:49)

## 2018-02-16 NOTE — Telephone Encounter (Signed)
I spoke to Jemison at [facility] this morning.  She did not receive the orders that were faxed and confirmed on 02/13/18.  They have been faxed again with a confirmation of receipt.  She is aware that this has been completed and will call me back if she does not receive them.    He originally had an appt on 02/16/18 but it was canceled by his facility with a request to reschedule to 02/18/18. She is going to reach out to get him re-scheduled.

## 2018-02-16 NOTE — Progress Notes (Signed)
Pt presented for IVIG infusion, orders sufficient, and Pt tolerated infusion without event. Paper work (from [employer]) for DNR order accompanied Pt . [facility] in [place], his place of residence, LPN Karis Juba, given report of above and need for lunch as well as blood sugar check. Also reported to her that Pt has another appointment tomorrow at 0930am.  Pt was accompanied by his son today who is taking him back to the facility.

## 2018-02-17 ENCOUNTER — Encounter (HOSPITAL_COMMUNITY)
Admission: RE | Admit: 2018-02-17 | Discharge: 2018-02-17 | Disposition: A | Discharge status: Home / Self Care | Payer: Medicare Other | Source: Ambulatory Visit | Attending: Neurology | Admitting: Neurology

## 2018-02-17 ENCOUNTER — Encounter (HOSPITAL_COMMUNITY): Payer: Self-pay

## 2018-02-17 DIAGNOSIS — G7000 Myasthenia gravis without (acute) exacerbation: Secondary | ICD-10-CM | POA: Insufficient documentation

## 2018-02-17 MED ORDER — DEXTROSE 5 % IV SOLN
INTRAVENOUS | Status: DC
  Administered 2018-02-17: 10:00:00 via INTRAVENOUS

## 2018-02-17 MED ORDER — DIPHENHYDRAMINE HCL 50 MG/ML IJ SOLN
25.0000 mg | Freq: Once | INTRAMUSCULAR | Status: AC
  Administered 2018-02-17: 25 mg via INTRAVENOUS
  Filled 2018-02-17: qty 1

## 2018-02-17 MED ORDER — IMMUNE GLOBULIN (HUMAN) 5 GM/50ML IV SOLN
45.0000 g | INTRAVENOUS | Status: AC
  Administered 2018-02-17: 45 g via INTRAVENOUS
  Filled 2018-02-17: qty 50

## 2018-02-17 MED ORDER — ACETAMINOPHEN 325 MG PO TABS
650.0000 mg | ORAL_TABLET | Freq: Once | ORAL | Status: AC
  Administered 2018-02-17: 650 mg via ORAL
  Filled 2018-02-17: qty 2

## 2018-02-17 NOTE — Progress Notes (Signed)
Patient completed his second dose of IVIG. Called [facility] [place] to inform them that patient will return to their facility in about 45 minutes and will need his lunch tray and CBG checked. Patient tolerated his infusion well.

## 2018-03-16 ENCOUNTER — Encounter (HOSPITAL_COMMUNITY): Payer: Medicare Other

## 2018-03-16 NOTE — Telephone Encounter (Signed)
I spoke to [facility] and she stated that she would need new order's stating re-start dose and frequency and fax to [phone] .

## 2018-03-16 NOTE — Telephone Encounter (Signed)
Pt wife(on [place],[phone] ) has  Called, she states she has not heard from anyone re: the infusions being set up to be done at pt's home.  Wife is asking for a call to discuss

## 2018-03-16 NOTE — Telephone Encounter (Addendum)
Per vo by Dr. Krista Blue, IVIG 1g/kg, every 28 days, infuse over two days. Orders will be sent to [facility] for home infusions. He is due for his infusion next week.

## 2018-03-17 ENCOUNTER — Encounter (HOSPITAL_COMMUNITY): Payer: Medicare Other

## 2018-04-13 ENCOUNTER — Encounter (HOSPITAL_COMMUNITY): Payer: Medicare Other

## 2018-04-14 ENCOUNTER — Encounter (HOSPITAL_COMMUNITY): Payer: Medicare Other

## 2018-04-23 ENCOUNTER — Ambulatory Visit: Payer: Medicare Other | Admitting: Neurology

## 2018-04-23 ENCOUNTER — Encounter: Payer: Self-pay | Admitting: Neurology

## 2018-04-23 VITALS — BP 124/69 | HR 74 | Ht 71.0 in | Wt 181.5 lb

## 2018-04-23 DIAGNOSIS — G7000 Myasthenia gravis without (acute) exacerbation: Secondary | ICD-10-CM | POA: Diagnosis not present

## 2018-04-23 NOTE — Progress Notes (Signed)
PATIENT: Bob Warren DOB: April 10, 1946  Chief Complaint  Patient presents with  . Myasthenia Gravis    He is here with his wife, [name].  No new concerns today.  He has continued with his home IVIG treatments.   He was in the hospital in July 2019 and then went to inpatient rehab for about one month.       HISTORICAL  Bob Warren is a 72 years old right-handed male, seen in refer by  my colleague Dr. Jaynee Eagles to continue follow-up for his myasthenia gravis.  He was seen by Dr. Jaynee Eagles on February 21 2017 as a referral from Bob Bott, MD for left upper eyelid droop.  He has past medical history of type 2 diabetes, hypertension, hyperlipidemia.   He noted left eyelid droopy around February 08 2017, intermittent, worse at nighttime, fatigue, at its worst, left upper eyelid would cover half of the left eye, with mild blocking of his left vision. he denies double vision, no swallowing difficulty, no limb muscle weakness noted.  Since initial visit, laboratory evaluations in July 2018 showed positive acetylcholine modulating antibody, binding antibody, normal TSH, CPK, CBC, CMP with exception of mild elevated glucose 147,   He was also getting a prescription of Mestinon 60 mg 3 times a day, he tolerated the medication well, with Mestinon treatment, he barely notice any significant droopy eyelid. Personally reviewed MRI of the brain August 2018, moderate atrophy, supratentorium small vessel disease. 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  Repetitive nerve stimulation March 06 2017 of right abductor digital minimum show no significant abnormality  On today's examination, he was noted to have mild double vision, bulbar, proximal upper and lower extremity weakness,   UPDATE May 07 2017:  He was admitted to the hospital in September for 2018, complains of generalized weakness, gait abnormality, was  diagnosed with positive rhinovirus by respiratory PCR, received IVIG treatment, showed moderate improvement,  I reviewed the laboratory evaluation in 2018, negative RPR, glucose was elevated 160, fasting lipid profile showed LDL of 87, cholesterol 151, A1c was 7.4, normal TSH,  Laboratory evaluation in October 2018 from his primary care doctor, normal WBC 5, hemoglobin was 12.5, CMP showed elevated glucose of 198, creatinine of 0.8, AST was mildly elevated 89, alkaline phosphate was 128, TSH was mildly elevated 3.4, urine analysis was negative  Chest x-ray in April 01 2017 showed no significant abnormality,  Since hospital admission on April 05 2017, he continued to feel weak, nauseous, difficulty walking, frequent urination about once hour, also complains of elevated glucose 160s  UPDATE Dec 6th 2018: CT chest on November 01/15/2017 showed no thymus abnormality, area of scarring and atelectasis in the inferior lingular, and left lower lobe He feel weaker, all over, no double vision, no droopy eye lid, word finding difficulty, no dysphagia, no SOB,   He is taking Mestinon 60 mg 3 times a day, complains of frequent diarrhea, feeling fatigued,  He received IVIG 2 g/kg on November 12, 13, 14, with mild improvement, planning on to receive it again on December 11, and 12, he hopes to transfer his IVIG treatment locally.  UPDATE October 02 2017: He was admitted to [facility] on August 04, 2017, at his worst, Bob Warren stated he could not move, mild dysphagia, per hospital admission, motor strength was 4 out of 5 throughout, he was treated with IVIG, which did help his symptoms,  Last ivig on Feb 12, 13th 2019, will have it monthly, he is no longer taking Mestinon due to GI side effect, Imuran was stopped in December 2018 due to abnormal liver functional test,  Repeat laboratory evaluation on August 05, 2017 showed normalization of AST, ALT, alkaline phosphate, continue mild elevated  bilirubin,  His diabetes is under suboptimal control,A1C 7.7  He responded very well to IVIG treatment.  UPDATE January 08 2018: He is accompanied by his wife at today's clinical visit, complaining of not feeling well, generalized weakness, chest area burning sensation, he does have acid reflux, has taking a lot of time, was seen by GI yesterday, was given a different prescription, he also noticed mild swallowing difficulties, denies breathing difficulties,  This morning his glucose level was only 56, only eat cornflakes cereal his dinner and breakfast, he was insulin shots, last IVIG was on January 06, 2018, tolerated infusion well, will change to home infusion later  He denies double vision, no limb muscle weakness  UPDATE Sept 26 2019: I reviewed discharge summary on February 03, 2018, he was discharged in July 2019 from [facility] for acute GI bleeding his pancytopenia, was admitted to the hospital on January 26, 2018 for weakness, difficulty ambulating, he was found to be tachycardia, febrile temperature of 102, ulceration of right foot, chest x-ray was unremarkable, elevated lactic acid level, and pancytopenia, he was treated with sepsis due to right foot cellulitis, ultrasound of abdomen showed cirrhosis with small ascites on January 14, 2018, acute hepatitis panel was negative, negative ANA, anti-smooth muscle antibody, antimitochondrial antibody, antimicrosomal antibody, severe peripheral edema, ascites secondary to third spacing from low albumin, improved with diuresis,  He later was discharged to inpatient rehabilitation, back home since August 2019, he is now back to his baseline, he has no significant flare up of his MG symptoms, he received infusion through [facility] on Sept 2, 3 (1g/kg break into two days).  He is to continue IVIG 1 g/kg divided dosing 2 days.  REVIEW OF SYSTEMS: Full 14 system review of systems performed and notable only for: Hearing loss ROS review of the  system were negative Allergies  Allergen Reactions  . Imuran [Azathioprine] Other (See Comments)    Abnormal LIver functional   . Lisinopril Cough    HOME MEDICATIONS: Current Outpatient Medications  Medication Sig Dispense Refill  . acetaminophen (TYLENOL) 500 MG tablet Take 1 tablet (500 mg total) by mouth every 6 (six) hours as needed for mild pain (or Fever >/= 101). 30 tablet 0  . collagenase (SANTYL) ointment Apply topically daily. 15 g 0  . diltiazem (CARDIZEM CD) 120 MG 24 hr capsule Take 1 capsule (120 mg total) by mouth daily.    Marland Kitchen docusate sodium (COLACE) 100 MG capsule Take 100 mg by mouth 2 (two) times daily.    . furosemide (LASIX) 20 MG tablet Take 3 tablets (60 mg total) by mouth 2 (two) times daily. 30 tablet   . insulin aspart (NOVOLOG) 100 UNIT/ML injection Inject 0-9 Units into the skin 3 (three) times daily with meals. 10 mL 11  . insulin glargine (LANTUS) 100 UNIT/ML injection Inject 0.14 mLs (14 Units total) into the skin at bedtime. 10 mL 11  . oxybutynin (DITROPAN-XL) 5 MG 24 hr tablet Take 1 tablet (5 mg total) by mouth at bedtime. 30 tablet 11  . pantoprazole (PROTONIX) 40 MG tablet Take 1 tablet (40 mg total) by mouth daily. 30 tablet 1  . potassium chloride (KLOR-CON)  20 MEQ packet Take 40 mEq by mouth daily.    . traMADol (ULTRAM) 50 MG tablet Take 1 tablet (50 mg total) by mouth every 6 (six) hours as needed for moderate pain. 30 tablet 0   No current facility-administered medications for this visit.     PAST MEDICAL HISTORY: Past Medical History:  Diagnosis Date  . Diabetes ([place])   . High cholesterol   . Hypertension   . Myasthenia gravis ([place])     PAST SURGICAL HISTORY: Past Surgical History:  Procedure Laterality Date  . COLONOSCOPY    . ESOPHAGOGASTRODUODENOSCOPY N/A 01/14/2018   Procedure: ESOPHAGOGASTRODUODENOSCOPY (EGD);  Surgeon: Virgel Manifold, MD;  Location[facility] ENDOSCOPY;  Service: Endoscopy;  Laterality: N/A;  . SKIN CANCER  EXCISION Right 2016   Arm    FAMILY HISTORY: Family History  Problem Relation Age of Onset  . Cancer Mother   . Other Father        Gunshot wound  . Diabetes Maternal Grandfather     SOCIAL HISTORY:  Social History   Socioeconomic History  . Marital status: Married    Spouse name: Not on file  . Number of children: 2  . Years of education: 1  . Highest education level: Not on file  Occupational History  . Occupation: Retired  Scientific laboratory technician  . Financial resource strain: Not on file  . Food insecurity:    Worry: Not on file    Inability: Not on file  . Transportation needs:    Medical: Not on file    Non-medical: Not on file  Tobacco Use  . Smoking status: Former Research scientist (life sciences)  . Smokeless tobacco: Never Used  . Tobacco comment: Quit 25 yrs ago  Substance and Sexual Activity  . Alcohol use: No    Comment: Quit 25 yrs ago  . Drug use: No  . Sexual activity: Not on file  Lifestyle  . Physical activity:    Days per week: Not on file    Minutes per session: Not on file  . Stress: Not on file  Relationships  . Social connections:    Talks on phone: Not on file    Gets together: Not on file    Attends religious service: Not on file    Active member of club or organization: Not on file    Attends meetings of clubs or organizations: Not on file    Relationship status: Not on file  . Intimate partner violence:    Fear of current or ex partner: Not on file    Emotionally abused: Not on file    Physically abused: Not on file    Forced sexual activity: Not on file  Other Topics Concern  . Not on file  Social History Narrative   Lives at home w/ his wife   Right-handed   Caffeine: 3-6 cups of coffee per day     PHYSICAL EXAM   Vitals:   04/23/18 1101  BP: 124/69  Pulse: 74  Weight: 181 lb 8 oz (82.3 kg)  Height: 5\' 11"  (1.803 m)    Not recorded      Body mass index is 25.31 kg/m.  PHYSICAL EXAMNIATION:  Gen: NAD, conversant, well nourised, obese, well  groomed                     Cardiovascular: Regular rate rhythm, no peripheral edema, warm, nontender. Eyes: Conjunctivae clear without exudates or hemorrhage Neck: Supple, no carotid bruits. Pulmonary: Clear to auscultation  bilaterally   NEUROLOGICAL EXAM:  MENTAL STATUS: Speech:    Speech is normal; fluent and spontaneous with normal comprehension.  Cognition:     Orientation to time, place and person     Normal recent and remote memory     Normal Attention span and concentration     Normal Language, naming, repeating,spontaneous speech     Fund of knowledge   CRANIAL NERVES: CN II: Visual fields are full to confrontation.   Pupils are round equal and briskly reactive to light. CN III, IV, VI: extraocular movement are normal. Cover and uncover testing showed mild bilateral exophoria,  CN V: Facial sensation is intact to pinprick in all 3 divisions bilaterally. Corneal responses are intact.  CN VII: Face is symmetric, he has slight bilateral eye closure cheek puff muscle weakness.  CN VIII: Hearing is normal to rubbing fingers CN IX, X: Palate elevates symmetrically. Phonation is normal. CN XI: Head turning and shoulder shrug are intact CN XII: Tongue is midline with normal movements and no atrophy.  MOTOR: There was no motor weakness noted, in specific, no evidence of neck flexion or limb muscle weakness  REFLEXES: Reflexes are 2+ and symmetric at the biceps, triceps, knees, and ankles. Plantar responses are flexor.  SENSORY: Intact to light touch, pinprick, positional sensation and vibratory sensation are intact in fingers and toes.  COORDINATION: Rapid alternating movements and fine finger movements are intact. There is no dysmetria on finger-to-nose and heel-knee-shin.    GAIT/STANCE: He is able to get up from seated position arm crossed   DIAGNOSTIC DATA (LABS, IMAGING, TESTING) - I reviewed patient records, labs, notes, testing and imaging myself where  available.   ASSESSMENT AND PLAN  Saamir Armstrong is a 72 y.o. male   Seropositive generalized myasthenia gravis,  There was no significant bulbar or limb muscle weakness noted  Positive acetylcholine binding antibody in the past  Complains of frequent diarrhea, stop Mestinon 60 mg 3 times a day  Not a good candidate for long-term prednisone treatment due to diabetes,A1C 7.7, insulin-dependent diabetes,  Insurance will not cover CellCept 500 mg twice a day, he was taking Imuran 50 mg 2 tablets twice a day since April 07 2017, developed mild abnormal liver functional test, Imuran was stopped since December 2018, he previously had abnormal liver functional test, negative hepatitis panel, repeat laboratory evaluations in January 2019, a month after stopping Imuran, liver functional test was normalized  Continue IVIG treatment  (First IVIG treatment was on November 12, 13, 14, 2018 for total of 2 g/kg, second infusion was on July 14, 2017 2018, 1 g/kg; February 12, 13 2019, 1 g/kg; preauthorization is good until May 20, 2018 through intra-fusion, last infusion was on January 06, 2018, he will continue IVIG infusion for home health specialty pharmacy, 1g/kg divided into two days)  Face to face time was 25  minutes, greater than 50% of the time was spent in counseling and coordination of care with the patient.      Marcial Pacas, M.D. Ph.D.  [facility] [address] [place], [place] [address] Ph: [phone] Fax: [phone]  CC: Referring Provider

## 2018-04-29 ENCOUNTER — Telehealth: Payer: Self-pay | Admitting: Neurology

## 2018-04-30 NOTE — Telephone Encounter (Signed)
Letter for continue IVIG 10%, 1g/k every 28 days, divide the dose in two days.

## 2018-05-04 ENCOUNTER — Telehealth: Payer: Self-pay | Admitting: Neurology

## 2018-05-04 NOTE — Telephone Encounter (Signed)
Patient calling to discuss infusions. He says [facility] [employer] will not cover them anymore. Please call.Marland Kitchen

## 2018-05-04 NOTE — Telephone Encounter (Signed)
Initially, his IVIG was denied.  We initiated an appeal and it is now approved.  Valid dates: 04/28/18 through 07/31/2018.  Member TC#[id number].  Ref#: [id number].  [facility] and patient have both been notified.

## 2018-05-26 ENCOUNTER — Encounter (HOSPITAL_COMMUNITY): Payer: Medicare Other

## 2018-05-27 ENCOUNTER — Encounter (HOSPITAL_COMMUNITY): Payer: Medicare Other

## 2018-06-10 ENCOUNTER — Telehealth: Payer: Self-pay | Admitting: Neurology

## 2018-06-10 NOTE — Telephone Encounter (Addendum)
Returned call to the patient - he has spoken with [facility] (his home infusion company) who will be taking care of getting his IVIG approved for the new calendar year.  He will call us if there is any problem.

## 2018-06-10 NOTE — Telephone Encounter (Signed)
Pt is asking for a call from RN to discuss his infusion that he takes at home.  Pt wanting to discuss his insurance and what they will be able to cover in January 2020

## 2018-06-23 ENCOUNTER — Encounter (HOSPITAL_COMMUNITY): Payer: Medicare Other

## 2018-06-24 ENCOUNTER — Encounter (HOSPITAL_COMMUNITY): Payer: Medicare Other

## 2018-07-07 ENCOUNTER — Telehealth: Payer: Self-pay

## 2018-07-07 NOTE — Telephone Encounter (Signed)
IVIG orders signed and dated by Dr. Krista Blue.  This is for nursing re certification  of infusion visits. Form fax to to [phone]. Form fax twice and receive.

## 2018-07-16 ENCOUNTER — Ambulatory Visit: Payer: Medicare Other | Admitting: Nurse Practitioner

## 2018-07-30 ENCOUNTER — Encounter (HOSPITAL_COMMUNITY): Payer: Medicare Other

## 2018-07-31 ENCOUNTER — Encounter (HOSPITAL_COMMUNITY): Payer: Medicare Other

## 2018-08-14 ENCOUNTER — Telehealth: Payer: Self-pay | Admitting: Neurology

## 2018-08-14 NOTE — Telephone Encounter (Signed)
FYI: Spoke to pt, and his insurance company has notified him they will only cover his infusions until the end of March. He req I let you know

## 2018-08-17 NOTE — Telephone Encounter (Signed)
I have spoken to Bob Warren.  She is aware of his IVIG expiration date.  States it just needs a new re-authorization.  [facility] is already working with his [employer].  I also called the patient back to update him.

## 2018-08-17 NOTE — Telephone Encounter (Signed)
Patient is getting his IVIG infusions through [facility].  I left a message for Marquis Lunch (our account manager) to please call me back to discuss this patient's insurance coverage.

## 2018-09-29 ENCOUNTER — Telehealth: Payer: Self-pay | Admitting: Neurology

## 2018-09-29 NOTE — Telephone Encounter (Signed)
Tiffany from [facility] called stating the pt is due for reauth and they are needing his most recent OV notes and labs.

## 2018-09-29 NOTE — Telephone Encounter (Signed)
Patient's records faxed to [facility] at [phone].  Receipt confirmed.

## 2018-10-09 NOTE — Telephone Encounter (Signed)
Fax received from [employer], phone# [phone].  Gamunex-c 10% solution is approved for 3 mos. through 01/07/19, under pt's Medicare Part D benefit. Reviewed by JRA, Pharm.D. Ref# [id number]

## 2018-10-21 ENCOUNTER — Telehealth: Payer: Self-pay | Admitting: Neurology

## 2018-10-21 NOTE — Telephone Encounter (Signed)
Tiffany from [facility] called statin the the pt is due for reauthorization. She is needing the pts most recent OV notes and labs faxed to [phone] attention Intake. Please advise.

## 2018-10-21 NOTE — Telephone Encounter (Signed)
Records faxed and confirmed to the number below.

## 2018-10-22 ENCOUNTER — Ambulatory Visit: Payer: Medicare Other | Admitting: Neurology

## 2018-10-22 ENCOUNTER — Ambulatory Visit: Payer: Medicare Other | Admitting: Nurse Practitioner

## 2019-02-03 ENCOUNTER — Ambulatory Visit (INDEPENDENT_AMBULATORY_CARE_PROVIDER_SITE_OTHER): Payer: Medicare Other | Admitting: Neurology

## 2019-02-03 ENCOUNTER — Other Ambulatory Visit: Payer: Self-pay

## 2019-02-03 ENCOUNTER — Encounter: Payer: Self-pay | Admitting: Neurology

## 2019-02-03 VITALS — BP 122/72 | HR 60 | Temp 98.2°F | Ht 71.0 in | Wt 181.5 lb

## 2019-02-03 DIAGNOSIS — G7000 Myasthenia gravis without (acute) exacerbation: Secondary | ICD-10-CM

## 2019-02-03 NOTE — Progress Notes (Signed)
PATIENT: Bob Warren DOB: 1946/03/31  Chief Complaint  Patient presents with   Myasthenia Gravis    He is here with his wife, [name], for his routine follow up.  He has continued getting IVIG at home through [facility].  No new concerns today.     HISTORICAL  Bob Warren is a 73 years old right-handed male, seen in refer by  my colleague Dr. Jaynee Eagles to continue follow-up for his myasthenia gravis.  He was seen by Dr. Jaynee Eagles on February 21 2017 as a referral from [name], MD for left upper eyelid droop.  He has past medical history of type 2 diabetes, hypertension, hyperlipidemia.   He noted left eyelid droopy around February 08 2017, intermittent, worse at nighttime, fatigue, at its worst, left upper eyelid would cover half of the left eye, with mild blocking of his left vision. he denies double vision, no swallowing difficulty, no limb muscle weakness noted.  Since initial visit, laboratory evaluations in July 2018 showed positive acetylcholine modulating antibody, binding antibody, normal TSH, CPK, CBC, CMP with exception of mild elevated glucose 147,   He was also getting a prescription of Mestinon 60 mg 3 times a day, he tolerated the medication well, with Mestinon treatment, he barely notice any significant droopy eyelid. Personally reviewed MRI of the brain August 2018, moderate atrophy, supratentorium small vessel disease. 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  Repetitive nerve stimulation March 06 2017 of right abductor digital minimum show no significant abnormality  On today's examination, he was noted to have mild double vision, bulbar, proximal upper and lower extremity weakness,   UPDATE May 07 2017:  He was admitted to the hospital in September for 2018, complains of generalized weakness, gait abnormality, was diagnosed with positive rhinovirus by respiratory PCR, received  IVIG treatment, showed moderate improvement,  I reviewed the laboratory evaluation in 2018, negative RPR, glucose was elevated 160, fasting lipid profile showed LDL of 87, cholesterol 151, A1c was 7.4, normal TSH,  Laboratory evaluation in October 2018 from his primary care doctor, normal WBC 5, hemoglobin was 12.5, CMP showed elevated glucose of 198, creatinine of 0.8, AST was mildly elevated 89, alkaline phosphate was 128, TSH was mildly elevated 3.4, urine analysis was negative  Chest x-ray in April 01 2017 showed no significant abnormality,  Since hospital admission on April 05 2017, he continued to feel weak, nauseous, difficulty walking, frequent urination about once hour, also complains of elevated glucose 160s  UPDATE Dec 6th 2018: CT chest on November 01/15/2017 showed no thymus abnormality, area of scarring and atelectasis in the inferior lingular, and left lower lobe He feel weaker, all over, no double vision, no droopy eye lid, word finding difficulty, no dysphagia, no SOB,   He is taking Mestinon 60 mg 3 times a day, complains of frequent diarrhea, feeling fatigued,  He received IVIG 2 g/kg on November 12, 13, 14, with mild improvement, planning on to receive it again on December 11, and 12, he hopes to transfer his IVIG treatment locally.  UPDATE October 02 2017: He was admitted to [facility] on August 04, 2017, at his worst, Mr. Malena stated he could not move, mild dysphagia, per hospital admission, motor strength was 4 out of 5 throughout, he was treated with IVIG, which did help his symptoms,  Last ivig on Feb 12, 13th 2019, will have it monthly, he is no longer  taking Mestinon due to GI side effect, Imuran was stopped in December 2018 due to abnormal liver functional test,  Repeat laboratory evaluation on August 05, 2017 showed normalization of AST, ALT, alkaline phosphate, continue mild elevated bilirubin,  His diabetes is under suboptimal control,A1C 7.7  He  responded very well to IVIG treatment.  UPDATE January 08 2018: He is accompanied by his wife at today's clinical visit, complaining of not feeling well, generalized weakness, chest area burning sensation, he does have acid reflux, has taking a lot of time, was seen by GI yesterday, was given a different prescription, he also noticed mild swallowing difficulties, denies breathing difficulties,  This morning his glucose level was only 56, only eat cornflakes cereal his dinner and breakfast, he was insulin shots, last IVIG was on January 06, 2018, tolerated infusion well, will change to home infusion later  He denies double vision, no limb muscle weakness  UPDATE Sept 26 2019: I reviewed discharge summary on February 03, 2018, he was discharged in July 2019 from [facility] for acute GI bleeding his pancytopenia, was admitted to the hospital on January 26, 2018 for weakness, difficulty ambulating, he was found to be tachycardia, febrile temperature of 102, ulceration of right foot, chest x-ray was unremarkable, elevated lactic acid level, and pancytopenia, he was treated with sepsis due to right foot cellulitis, ultrasound of abdomen showed cirrhosis with small ascites on January 14, 2018, acute hepatitis panel was negative, negative ANA, anti-smooth muscle antibody, antimitochondrial antibody, antimicrosomal antibody, severe peripheral edema, ascites secondary to third spacing from low albumin, improved with diuresis,  He later was discharged to inpatient rehabilitation, back home since August 2019, he is now back to his baseline, he has no significant flare up of his MG symptoms, he received infusion through [facility] on Sept 2, 3 (1g/kg break into two days).  He is to continue IVIG 1 g/kg divided dosing 2 days.  UPDATE February 03 2019: He is accompanied by his wife at today's visit, he has been receiving IVIG through [facility] in home infusion 1 g/kg break up in 2 days, tolerating it well, no flareup of  myasthenia gravis symptoms, he wants to continue the infusion, not a good candidate for prednisone due to history of diabetes, insurance does not cover CellCept, Imuran because abnormal liver functional test   REVIEW OF SYSTEMS: Full 14 system review of systems performed and notable only for: Hearing loss ROS review of the system were negative Allergies  Allergen Reactions   Imuran [Azathioprine] Other (See Comments)    Abnormal LIver functional    Lisinopril Cough    HOME MEDICATIONS: Current Outpatient Medications  Medication Sig Dispense Refill   collagenase (SANTYL) ointment Apply topically daily. 15 g 0   diltiazem (CARDIZEM CD) 120 MG 24 hr capsule Take 1 capsule (120 mg total) by mouth daily.     fluticasone (FLONASE) 50 MCG/ACT nasal spray Place 1 spray into both nostrils as needed for allergies or rhinitis.     furosemide (LASIX) 20 MG tablet Take 3 tablets (60 mg total) by mouth 2 (two) times daily. 30 tablet    insulin aspart (NOVOLOG) 100 UNIT/ML injection Inject 0-9 Units into the skin 3 (three) times daily with meals. 10 mL 11   insulin glargine (LANTUS) 100 UNIT/ML injection Inject 0.14 mLs (14 Units total) into the skin at bedtime. 10 mL 11   losartan (COZAAR) 25 MG tablet Take 25 mg by mouth daily.     metFORMIN (GLUCOPHAGE) 850  MG tablet Take 850 mg by mouth 2 (two) times daily with a meal.     potassium chloride (KLOR-CON) 20 MEQ packet Take 40 mEq by mouth daily.     traMADol (ULTRAM) 50 MG tablet Take 1 tablet (50 mg total) by mouth every 6 (six) hours as needed for moderate pain. 30 tablet 0   No current facility-administered medications for this visit.     PAST MEDICAL HISTORY: Past Medical History:  Diagnosis Date   Diabetes ([place])    High cholesterol    Hypertension    Myasthenia gravis ([place])     PAST SURGICAL HISTORY: Past Surgical History:  Procedure Laterality Date   COLONOSCOPY     ESOPHAGOGASTRODUODENOSCOPY N/A 01/14/2018    Procedure: ESOPHAGOGASTRODUODENOSCOPY (EGD);  Surgeon: Virgel Manifold, MD;  Location: [facility] ENDOSCOPY;  Service: Endoscopy;  Laterality: N/A;   SKIN CANCER EXCISION Right 2016   Arm    FAMILY HISTORY: Family History  Problem Relation Age of Onset   Cancer Mother    Other Father        Gunshot wound   Diabetes Maternal Grandfather     SOCIAL HISTORY:  Social History   Socioeconomic History   Marital status: Married    Spouse name: Not on file   Number of children: 2   Years of education: 12   Highest education level: Not on file  Occupational History   Occupation: Retired  Scientist, product/process development strain: Not on file   Food insecurity    Worry: Not on file    Inability: Not on Lexicographer needs    Medical: Not on file    Non-medical: Not on file  Tobacco Use   Smoking status: Former Smoker   Smokeless tobacco: Never Used   Tobacco comment: Quit 25 yrs ago  Substance and Sexual Activity   Alcohol use: No    Comment: Quit 25 yrs ago   Drug use: No   Sexual activity: Not on file  Lifestyle   Physical activity    Days per week: Not on file    Minutes per session: Not on file   Stress: Not on file  Relationships   Social connections    Talks on phone: Not on file    Gets together: Not on file    Attends religious service: Not on file    Active member of club or organization: Not on file    Attends meetings of clubs or organizations: Not on file    Relationship status: Not on file   Intimate partner violence    Fear of current or ex partner: Not on file    Emotionally abused: Not on file    Physically abused: Not on file    Forced sexual activity: Not on file  Other Topics Concern   Not on file  Social History Narrative   Lives at home w/ his wife   Right-handed   Caffeine: 3-6 cups of coffee per day     PHYSICAL EXAM   Vitals:   02/03/19 1259  BP: 122/72  Pulse: 60  Temp: 98.2 F (36.8 C)  Weight:  181 lb 8 oz (82.3 kg)  Height: 5' 11"  (1.803 m)    Not recorded      Body mass index is 25.31 kg/m.  PHYSICAL EXAMNIATION:  Gen: NAD, conversant, well nourised, obese, well groomed  Cardiovascular: Regular rate rhythm, no peripheral edema, warm, nontender. Eyes: Conjunctivae clear without exudates or hemorrhage Neck: Supple, no carotid bruits. Pulmonary: Clear to auscultation bilaterally   NEUROLOGICAL EXAM:  MENTAL STATUS: Speech:    Speech is normal; fluent and spontaneous with normal comprehension.  Cognition:     Orientation to time, place and person     Normal recent and remote memory     Normal Attention span and concentration     Normal Language, naming, repeating,spontaneous speech     Fund of knowledge   CRANIAL NERVES: CN II: Visual fields are full to confrontation.   Pupils are round equal and briskly reactive to light. CN III, IV, VI: extraocular movement are normal. Cover and uncover testing showed mild bilateral exophoria,  CN V: Facial sensation is intact to pinprick in all 3 divisions bilaterally. Corneal responses are intact.  CN VII: Face is symmetric, he has slight bilateral eye closure cheek puff muscle weakness.  CN VIII: Hearing is normal to rubbing fingers CN IX, X: Palate elevates symmetrically. Phonation is normal. CN XI: Head turning and shoulder shrug are intact CN XII: Tongue is midline with normal movements and no atrophy.  MOTOR: There was no motor weakness noted, in specific, no evidence of neck flexion or limb muscle weakness  REFLEXES: Reflexes are 2+ and symmetric at the biceps, triceps, knees, and ankles. Plantar responses are flexor.  SENSORY: Intact to light touch, pinprick, positional sensation and vibratory sensation are intact in fingers and toes.  COORDINATION: Rapid alternating movements and fine finger movements are intact. There is no dysmetria on finger-to-nose and heel-knee-shin.    GAIT/STANCE: He  is able to get up from seated position arm crossed   DIAGNOSTIC DATA (LABS, IMAGING, TESTING) - I reviewed patient records, labs, notes, testing and imaging myself where available.   ASSESSMENT AND PLAN  Badr Piedra is a 73 y.o. male   Seropositive generalized myasthenia gravis,  There was no significant bulbar or limb muscle weakness noted at today's exam  Positive acetylcholine binding antibody in the past  Complains of frequent diarrhea, stop Mestinon 60 mg 3 times a day  Not a good candidate for long-term prednisone treatment due to diabetes,A1C 7.7, insulin-dependent diabetes,  Insurance will not cover CellCept 500 mg twice a day, he was taking Imuran 50 mg 2 tablets twice a day since April 07 2017, developed mild abnormal liver functional test, Imuran was stopped since December 2018, he previously had abnormal liver functional test, negative hepatitis panel, repeat laboratory evaluations in January 2019, a month after stopping Imuran, liver functional test was normalized  Continue IVIG treatment 1g/kg ( breakup in two days, through [facility])  (First IVIG treatment was on November 12, 13, 14, 2018 for total of 2 g/kg, second infusion was on July 14, 2017 2018, 1 g/kg; February 12, 13 2019, 1 g/kg; preauthorization is good until May 20, 2018 through intra-fusion, last infusion was on January 06, 2018, he continues IVIG infusion through home health specialty pharmacy, 1g/kg divided into two days)        Marcial Pacas, M.D. Ph.D.  [facility] [address] [place], [place] [address] Ph: [phone] Fax: [phone]  CC: Referring Provider

## 2019-03-30 ENCOUNTER — Telehealth: Payer: Self-pay | Admitting: Neurology

## 2019-03-30 NOTE — Telephone Encounter (Signed)
Pt is asking if he can take the flu shot with his diagnosis of Myasthenia gravis

## 2019-03-30 NOTE — Telephone Encounter (Signed)
I returned the call to patient and he is aware of Dr. Rhea Belton advice below.

## 2019-03-30 NOTE — Telephone Encounter (Signed)
Yes, he should take flu vaccination

## 2019-06-02 IMAGING — CT CT CHEST W/O CM
2 series · 15 of 32 positions shown, 17 images · non-contrast
Comparison: Chest radiograph May 09, 2017

CLINICAL DATA: Myasthenia gravis

EXAM:
CT CHEST WITHOUT CONTRAST
TECHNIQUE: Multidetector CT imaging of the chest was performed following the
standard protocol without IV contrast.

[Series 2: chest w/(date) · axial · 0.72mm/px · z∈[-160,+54]mm · 7 of 151 slices shown, 9 images]
[im 22/151  mediastinal]
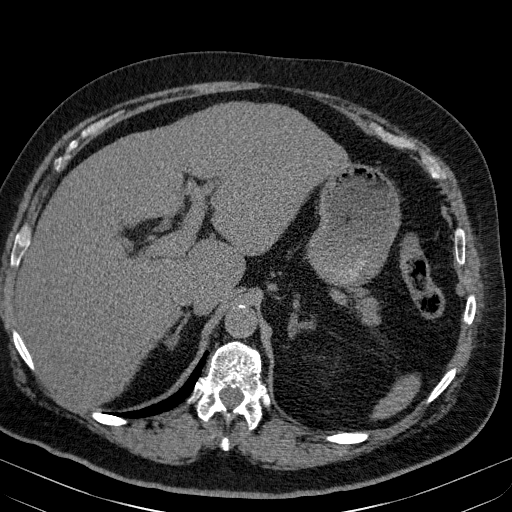
[im 22/151  lung]
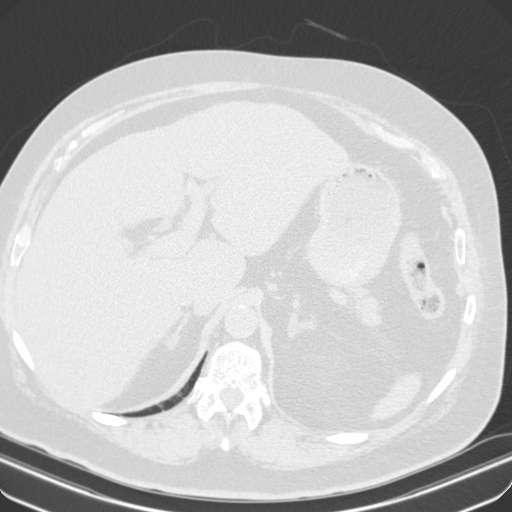
[im 43/151  lung]
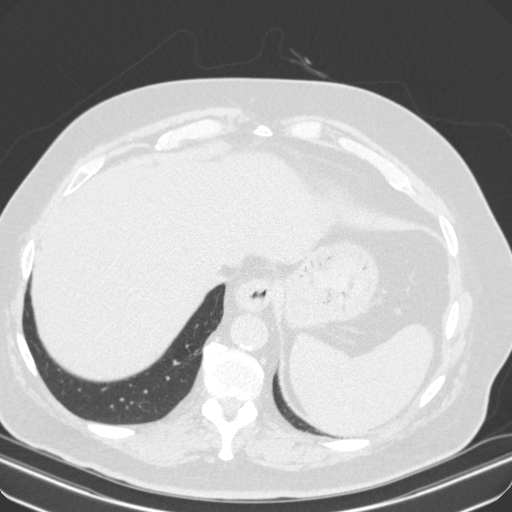
[im 65/151  lung]
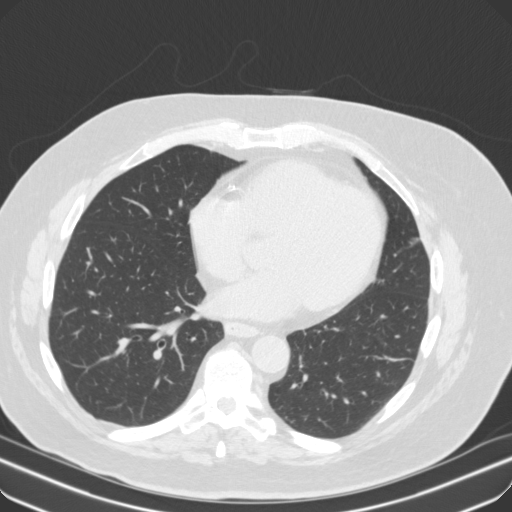
[im 72/151  lung]
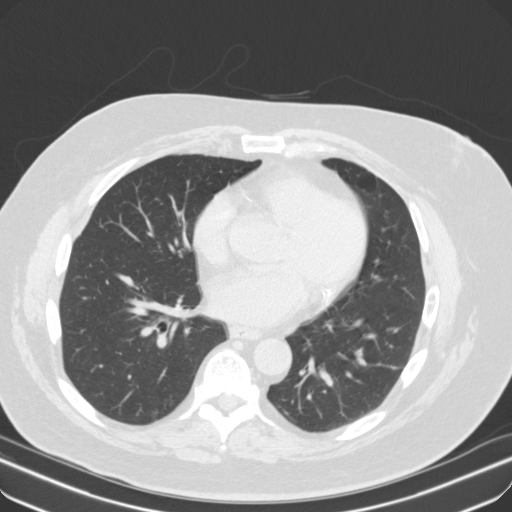
[im 86/151  mediastinal]
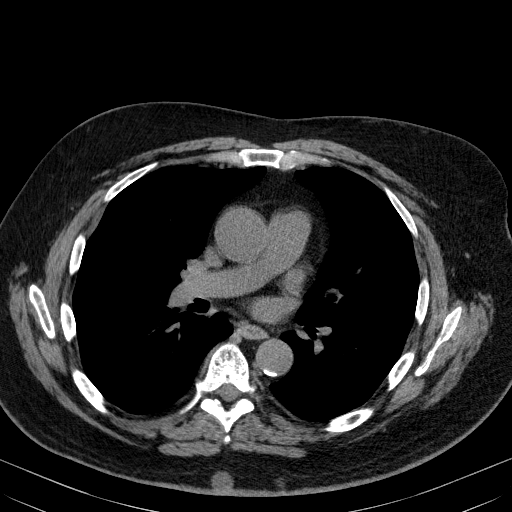
[im 86/151  lung]
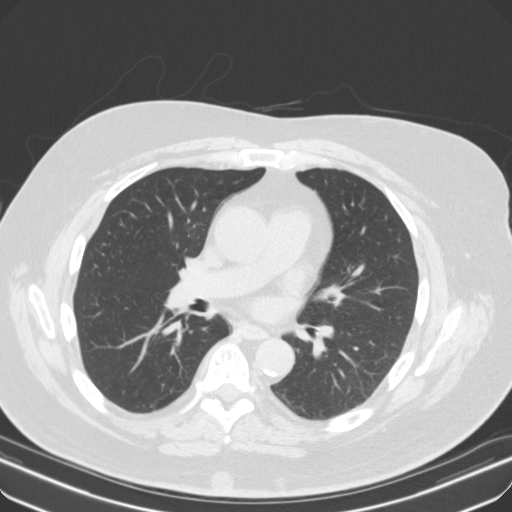
[im 108/151  lung]
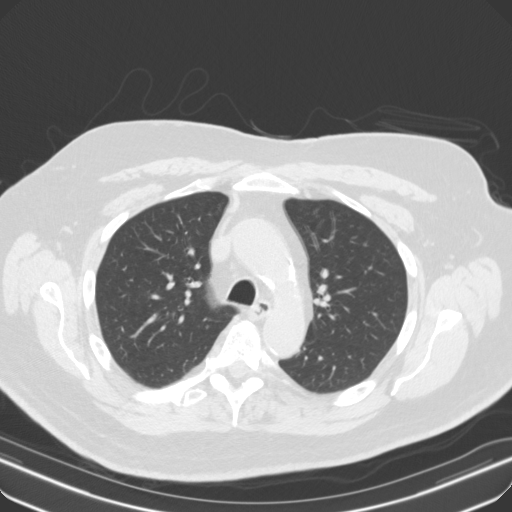
[im 129/151  lung]
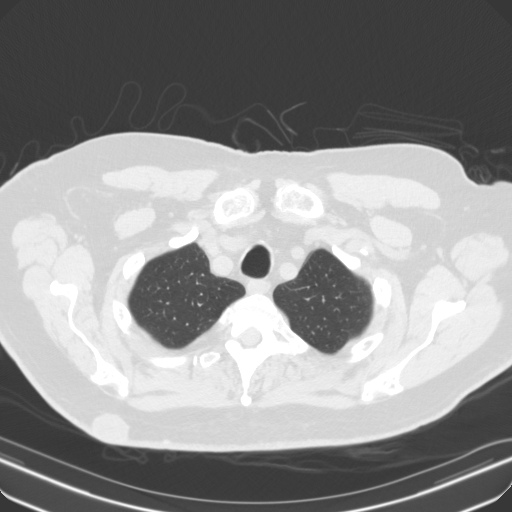

[Series 6: super d · axial · 0.72mm/px · z∈[-142,+69]mm · 8 of 384 slices shown]
[im 41/384  lung]
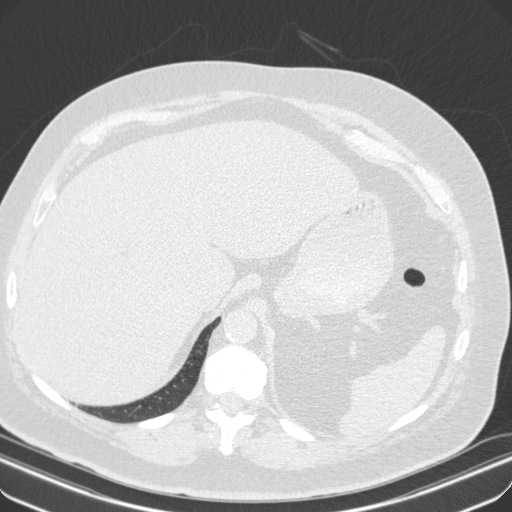
[im 81/384  lung]
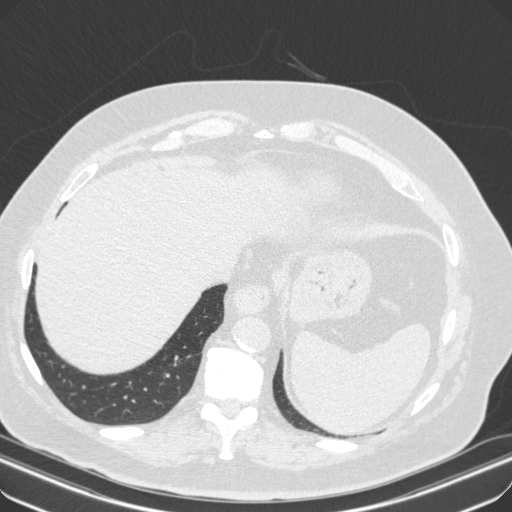
[im 128/384  lung]
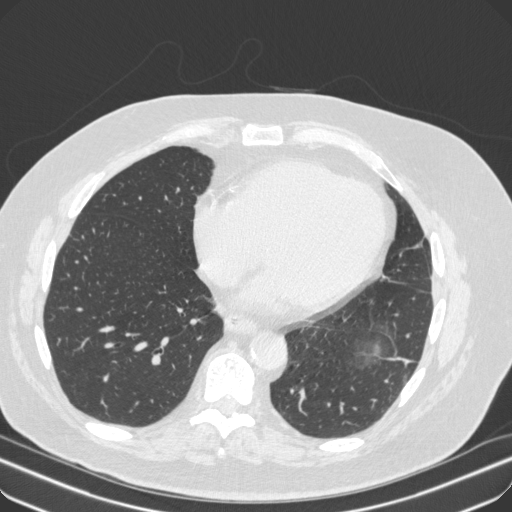
[im 162/384  lung]
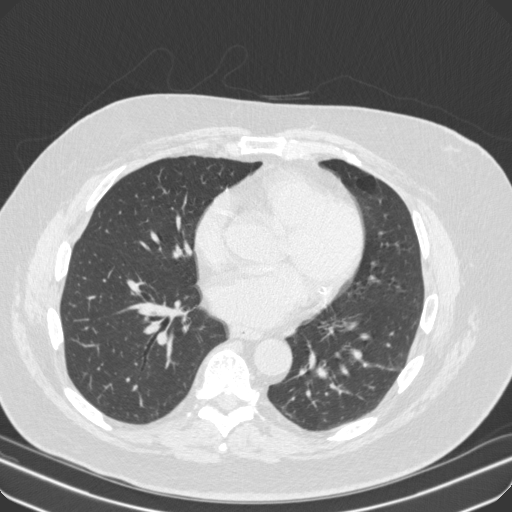
[im 202/384  lung]
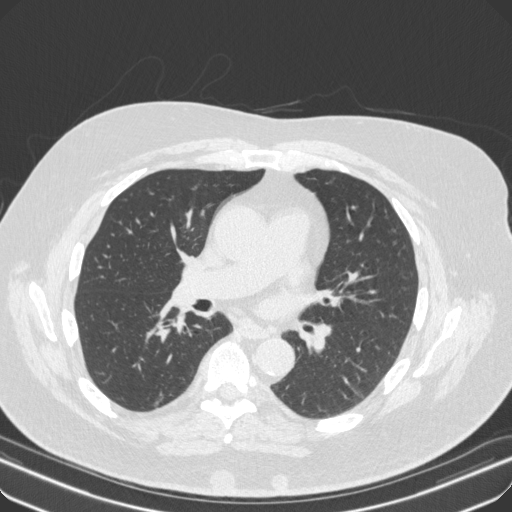
[im 256/384  lung]
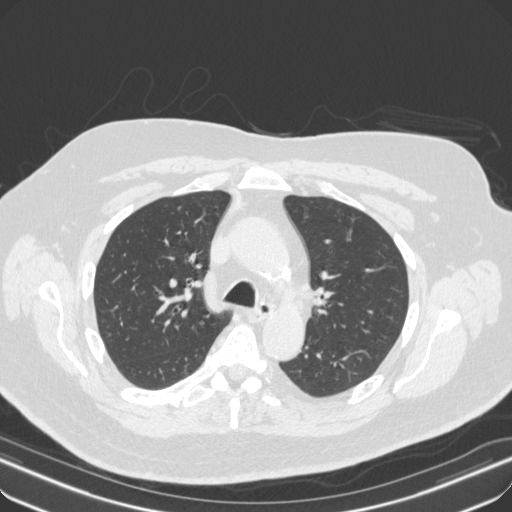
[im 303/384  lung]
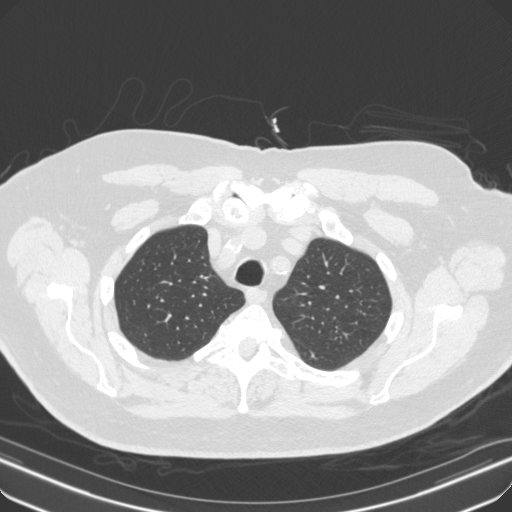
[im 343/384  lung]
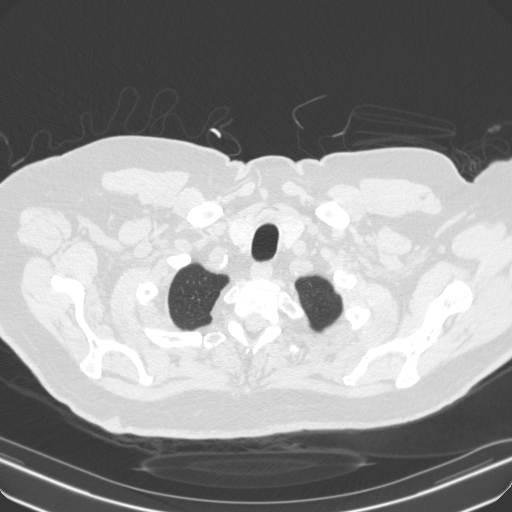

[15 of 32 positions shown; findings below may reference images not displayed]

FINDINGS: Cardiovascular: There is no thoracic aortic aneurysm. There are foci
of calcification in the proximal and mid great vessels. Note that
the right and left common carotid arteries arise as a common trunk,
an anatomic variant. There are foci of aortic atherosclerosis. There
also foci of coronary artery calcification at multiple sites.
Pericardium is not appreciably thickened.

Mediastinum/Nodes: Thyroid appears normal. No mediastinal mass is
seen. In particular, no anterior mediastinal masses evident. There
are a few rather minimal lymph nodes in the mediastinum. No
adenopathy by size criteria evident. No esophageal lesions are
appreciable.

Lungs/Pleura: There is scarring and atelectatic change in the left
base region with involvement in portions of the inferior lingula as
well as anterior and lateral segments of left lower lobe. There is
no edema or consolidation noted. No pleural effusion or pleural
thickening evident.

Upper Abdomen: There is atherosclerotic calcification in the upper
abdominal aorta. There is incomplete visualization of a cyst arising
from the lateral upper to mid right kidney measuring 4.0 x 3.5 cm.
Visualized upper abdominal structures otherwise appear normal.

Musculoskeletal: There are apparent sebaceous cysts posteriorly in
the thoracic region. Largest of these sebaceous cysts is to the
right of midline posteriorly at the upper thoracic level measuring
2.4 x 2.0 cm.

There is degenerative change in the lower thoracic spine. There are
no blastic or lytic bone lesions. There is midthoracic
dextroscoliosis.
IMPRESSION: 1.  No anterior mediastinal mass/thymic lesion.  No adenopathy.

2. Areas of scarring and atelectasis in the inferior lingula and
left lower lobe. No lung edema or consolidation. No pleural
effusion.

3. Foci of aortic atherosclerosis as well as foci of coronary artery
and great vessel calcification.

4.  Several sebaceous cysts in posterior back region.

Aortic Atherosclerosis ([id number]-OFA.A).

## 2019-07-14 ENCOUNTER — Telehealth: Payer: Self-pay | Admitting: Neurology

## 2019-07-14 NOTE — Telephone Encounter (Signed)
Pt wants to know if it will be safe for him to take the COVID 19 Vaccine . Please advise

## 2019-07-15 NOTE — Telephone Encounter (Signed)
The patient has myasthenia gravis.  States he has done well with flu vaccinations in the past.  Dr. Krista Blue researched the vaccination today.  Per her vo, he should be fine to get it.  He will not be in the first round of people to get the vaccination.  Therefore, she would like him to call her back closer to the time it will be available to him.  She is going to continue to research the data.  He verbalized understanding to call back prior to getting the medication.

## 2019-07-20 ENCOUNTER — Telehealth: Payer: Self-pay | Admitting: *Deleted

## 2019-07-20 NOTE — Telephone Encounter (Signed)
Received fax from [facility] infusion [facility] re: patient received Gamunex 40 gm infusions on 06/14/2019, 06/15/2019. For both infusions he reported no side effects or adverse reactions; no access or infusion issues to be noted; he tolerated infusions well.  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

## 2019-08-13 ENCOUNTER — Telehealth: Payer: Self-pay

## 2019-08-13 NOTE — Telephone Encounter (Signed)
Per Dr. Krista Blue, his myasthenia gravis diagnosis is not contraindicated with the COVID-19 vaccination.  He is okay to get it.  I returned the call to patient and informed him of this information.

## 2019-08-13 NOTE — Telephone Encounter (Signed)
error 

## 2019-08-13 NOTE — Telephone Encounter (Signed)
Patient called back to see if it is advised for him to get the COVID vaccine. Please follow up  CB#[phone]

## 2019-08-18 NOTE — Progress Notes (Signed)
PATIENT: Bob Warren DOB: May 04, 1946  REASON FOR VISIT: follow up HISTORY FROM: patient  HISTORY OF PRESENT ILLNESS: Today 08/19/19  HISTORY Bob Warren is a 74 years old right-handed male, seen in refer by  my colleague Dr. Jaynee Eagles to continue follow-up for his myasthenia gravis.  He was seen by Dr. Jaynee Eagles on February 21 2017 as a referral from Bob Bott, MD for left upper eyelid droop.  He has past medical history of type 2 diabetes, hypertension, hyperlipidemia.   He noted left eyelid droopy around February 08 2017, intermittent, worse at nighttime, fatigue, at its worst, left upper eyelid would cover half of the left eye, with mild blocking of his left vision. he denies double vision, no swallowing difficulty, no limb muscle weakness noted.  Since initial visit, laboratory evaluations in July 2018 showed positive acetylcholine modulating antibody, binding antibody, normal TSH, CPK, CBC, CMP with exception of mild elevated glucose 147,   He was also getting a prescription of Mestinon 60 mg 3 times a day, he tolerated the medication well, with Mestinon treatment, he barely notice any significant droopy eyelid. Personally reviewed MRI of the brain August 2018, moderate atrophy, supratentorium small vessel disease. 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  Repetitive nerve stimulation March 06 2017 of right abductor digital minimum show no significant abnormality  On today's examination, he was noted to have mild double vision, bulbar, proximal upper and lower extremity weakness,   UPDATE May 07 2017:  He was admitted to the hospital in September for 2018, complains of generalized weakness, gait abnormality, was diagnosed with positive rhinovirus by respiratory PCR, received IVIG treatment, showed moderate improvement,  I reviewed the laboratory evaluation in 2018, negative RPR,  glucose was elevated 160, fasting lipid profile showed LDL of 87, cholesterol 151, A1c was 7.4, normal TSH,  Laboratory evaluation in October 2018 from his primary care doctor, normal WBC 5, hemoglobin was 12.5, CMP showed elevated glucose of 198, creatinine of 0.8, AST was mildly elevated 89, alkaline phosphate was 128, TSH was mildly elevated 3.4, urine analysis was negative  Chest x-ray in April 01 2017 showed no significant abnormality,  Since hospital admission on April 05 2017, he continued to feel weak, nauseous, difficulty walking, frequent urination about once hour, also complains of elevated glucose 160s  UPDATE Dec 6th 2018: CT chest on November 01/15/2017 showed no thymus abnormality, area of scarring and atelectasis in the inferior lingular, and left lower lobe He feel weaker, all over, no double vision, no droopy eye lid, word finding difficulty, no dysphagia, no SOB,   He is taking Mestinon 60 mg 3 times a day, complains of frequent diarrhea, feeling fatigued,  He received IVIG 2 g/kg on November 12, 13, 14, with mild improvement, planning on to receive it again on December 11, and 12, he hopes to transfer his IVIG treatment locally.  UPDATE October 02 2017: He was admitted to [facility] on August 04, 2017, at his worst, [name] stated he could not move, mild dysphagia, per hospital admission, motor strength was 4 out of 5 throughout, he was treated with IVIG, which did help his symptoms,  Last ivig on Feb 12, 13th 2019, will have it monthly, he is no longer taking Mestinon due to GI side effect, Imuran was stopped in December 2018 due to abnormal liver functional test,  Repeat laboratory evaluation on August 05, 2017 showed normalization  of AST, ALT, alkaline phosphate, continue mild elevated bilirubin,  His diabetes is under suboptimal control,A1C 7.7  He responded very well to IVIG treatment.  UPDATE January 08 2018: He is accompanied by his wife at  today's clinical visit, complaining of not feeling well, generalized weakness, chest area burning sensation, he does have acid reflux, has taking a lot of time, was seen by GI yesterday, was given a different prescription, he also noticed mild swallowing difficulties, denies breathing difficulties,  This morning his glucose level was only 56, only eat cornflakes cereal his dinner and breakfast, he was insulin shots, last IVIG was on January 06, 2018, tolerated infusion well, will change to home infusion later  He denies double vision, no limb muscle weakness  UPDATE Sept 26 2019: I reviewed discharge summary on February 03, 2018, he was discharged in July 2019 from [facility] for acute GI bleeding his pancytopenia, was admitted to the hospital on January 26, 2018 for weakness, difficulty ambulating, he was found to be tachycardia, febrile temperature of 102, ulceration of right foot, chest x-ray was unremarkable, elevated lactic acid level, and pancytopenia, he was treated with sepsis due to right foot cellulitis, ultrasound of abdomen showed cirrhosis with small ascites on January 14, 2018, acute hepatitis panel was negative, negative ANA, anti-smooth muscle antibody, antimitochondrial antibody, antimicrosomal antibody, severe peripheral edema, ascites secondary to third spacing from low albumin, improved with diuresis,  He later was discharged to inpatient rehabilitation, back home since August 2019, he is now back to his baseline, he has no significant flare up of his MG symptoms, he received infusion through [facility] on Sept 2, 3 (1g/kg break into two days).  He is to continue IVIG 1 g/kg divided dosing 2 days.  UPDATE February 03 2019: He is accompanied by his wife at today's visit, he has been receiving IVIG through [facility] in home infusion 1 g/kg break up in 2 days, tolerating it well, no flareup of myasthenia gravis symptoms, he wants to continue the infusion, not a good candidate for  prednisone due to history of diabetes, insurance does not cover CellCept, Imuran because abnormal liver functional test  Update August 19, 2019 SS: He is accompanied by his wife today, has been receiving IVIG per [facility] home infusion, delay in infusion this month, a waiver where he was able to get IVIG for reduced cost expired, had to resubmit waiver, was last given IVIG around the second week of December.  He continues to do well, denies ptosis, diplopia, trouble chewing or swallowing, no limb weakness.  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  He is retired, loves reading.  REVIEW OF SYSTEMS: Out of a complete 14 system review of symptoms, the patient complains only of the following symptoms, and all other reviewed systems are negative.  N/A  ALLERGIES: Allergies  Allergen Reactions  . Imuran [Azathioprine] Other (See Comments)    Abnormal LIver functional   . Lisinopril Cough    HOME MEDICATIOnS: Outpatient Medications Prior to Visit  Medication Sig Dispense Refill  . diltiazem (CARDIZEM CD) 120 MG 24 hr capsule Take 1 capsule (120 mg total) by mouth daily.    . fluticasone (FLONASE) 50 MCG/ACT nasal spray Place 1 spray into both nostrils as needed for allergies or rhinitis.    . furosemide (LASIX) 20 MG tablet Take 3 tablets (60 mg total) by mouth 2 (two) times daily. 30 tablet   . insulin aspart (NOVOLOG) 100 UNIT/ML injection  Inject 0-9 Units into the skin 3 (three) times daily with meals. 10 mL 11  . insulin glargine (LANTUS) 100 UNIT/ML injection Inject 0.14 mLs (14 Units total) into the skin at bedtime. 10 mL 11  . losartan (COZAAR) 25 MG tablet Take 25 mg by mouth daily.    . metFORMIN (GLUCOPHAGE) 850 MG tablet Take 850 mg by mouth 2 (two) times daily with a meal.    . potassium chloride (KLOR-CON) 20 MEQ packet Take 40 mEq by mouth daily.    . traMADol (ULTRAM) 50 MG tablet Take 1 tablet (50 mg total) by mouth every 6 (six) hours as needed for moderate  pain. 30 tablet 0  . collagenase (SANTYL) ointment Apply topically daily. (Patient not taking: Reported on 08/19/2019) 15 g 0   No facility-administered medications prior to visit.    PAST MEDICAL HISTORY: Past Medical History:  Diagnosis Date  . Diabetes ([place])   . High cholesterol   . Hypertension   . Myasthenia gravis ([place])     PAST SURGICAL HISTORY: Past Surgical History:  Procedure Laterality Date  . COLONOSCOPY    . ESOPHAGOGASTRODUODENOSCOPY N/A 01/14/2018   Procedure: ESOPHAGOGASTRODUODENOSCOPY (EGD);  Surgeon: Virgel Manifold, MD;  Location: [facility] ENDOSCOPY;  Service: Endoscopy;  Laterality: N/A;  . SKIN CANCER EXCISION Right 2016   Arm    FAMILY HISTORY: Family History  Problem Relation Age of Onset  . Cancer Mother   . Other Father        Gunshot wound  . Diabetes Maternal Grandfather     SOCIAL HISTORY: Social History   Socioeconomic History  . Marital status: Married    Spouse name: Not on file  . Number of children: 2  . Years of education: 68  . Highest education level: Not on file  Occupational History  . Occupation: Retired  Tobacco Use  . Smoking status: Former Research scientist (life sciences)  . Smokeless tobacco: Never Used  . Tobacco comment: Quit 25 yrs ago  Substance and Sexual Activity  . Alcohol use: No    Comment: Quit 25 yrs ago  . Drug use: No  . Sexual activity: Not on file  Other Topics Concern  . Not on file  Social History Narrative   Lives at home w/ his wife   Right-handed   Caffeine: 3-6 cups of coffee per day   Social Determinants of Health   Financial Resource Strain:   . Difficulty of Paying Living Expenses: Not on file  Food Insecurity:   . Worried About Charity fundraiser in the Last Year: Not on file  . Ran Out of Food in the Last Year: Not on file  Transportation Needs:   . Lack of Transportation (Medical): Not on file  . Lack of Transportation (Non-Medical): Not on file  Physical Activity:   . Days of Exercise per Week: Not on  file  . Minutes of Exercise per Session: Not on file  Stress:   . Feeling of Stress : Not on file  Social Connections:   . Frequency of Communication with Friends and Family: Not on file  . Frequency of Social Gatherings with Friends and Family: Not on file  . Attends Religious Services: Not on file  . Active Member of Clubs or Organizations: Not on file  . Attends Archivist Meetings: Not on file  . Marital Status: Not on file  Intimate Partner Violence:   . Fear of Current or Ex-Partner: Not on file  . Emotionally Abused: Not  on file  . Physically Abused: Not on file  . Sexually Abused: Not on file   PHYSICAL EXAM  Vitals:   08/19/19 1132  BP: 124/68  Pulse: 60  Temp: 98.3 F (36.8 C)  Weight: 188 lb (85.3 kg)  Height: 5\' 11"  (1.803 m)   Body mass index is 26.22 kg/m.  Generalized: Well developed, in no acute distress   Neurological examination  Mentation: Alert oriented to time, place, history taking. Follows all commands speech and language fluent Cranial nerve II-XII: Pupils were equal round reactive to light. Extraocular movements were full, visual field were full on confrontational test. Facial sensation and strength were normal.  Head turning and shoulder shrug  were normal and symmetric.  With superior gaze for 1 minute, no ptosis or subjective diplopia was noted. Motor: The motor testing reveals 5 over 5 strength of all 4 extremities. Good symmetric motor tone is noted throughout.  Sensory: Sensory testing is intact to soft touch on all 4 extremities. No evidence of extinction is noted.  Coordination: Cerebellar testing reveals good finger-nose-finger and heel-to-shin bilaterally.  Gait and station: Able to rise from seated position without pushoff, able to ambulate independently in the room, without cane, was steady. Reflexes: Deep tendon reflexes are symmetric and normal bilaterally.   DIAGNOSTIC DATA (LABS, IMAGING, TESTING) - I reviewed patient  records, labs, notes, testing and imaging myself where available.  Lab Results  Component Value Date   WBC 5.3 02/03/2018   HGB 8.8 (L) 02/03/2018   HCT 27.1 (L) 02/03/2018   MCV 110.2 (H) 02/03/2018   PLT 69 (L) 02/03/2018      Component Value Date/Time   NA 141 02/03/2018 0437   NA 140 07/03/2017 1253   K 3.1 (L) 02/03/2018 0437   CL 107 02/03/2018 0437   CO2 25 02/03/2018 0437   GLUCOSE 185 (H) 02/03/2018 0437   BUN 26 (H) 02/03/2018 0437   BUN 22 07/03/2017 1253   CREATININE 1.75 (H) 02/03/2018 0437   CALCIUM 8.1 (L) 02/03/2018 0437   PROT 6.1 (L) 02/03/2018 0437   PROT 6.2 07/03/2017 1253   ALBUMIN 1.9 (L) 02/03/2018 0437   ALBUMIN 2.8 (L) 07/03/2017 1253   AST 35 02/03/2018 0437   ALT 25 02/03/2018 0437   ALKPHOS 85 02/03/2018 0437   BILITOT 1.8 (H) 02/03/2018 0437   BILITOT 5.9 (H) 07/03/2017 1253   GFRNONAA 37 (L) 02/03/2018 0437   GFRAA 43 (L) 02/03/2018 0437   Lab Results  Component Value Date   CHOL 151 04/01/2017   HDL 48 04/01/2017   LDLCALC 87 04/01/2017   TRIG 82 04/01/2017   CHOLHDL 3.1 04/01/2017   Lab Results  Component Value Date   HGBA1C 7.7 (H) 07/03/2017   Lab Results  Component Value Date   VITAMINB12 444 01/28/2018   Lab Results  Component Value Date   TSH 2.607 01/13/2018   ASSESSMENT AND PLAN 74 y.o. year old male  has a past medical history of Diabetes ([place]), High cholesterol, Hypertension, and Myasthenia gravis ([place]). here with:  1.  Seropositive generalized myasthenia gravis -Positive acetylcholine binding antibody in the past -Was unable to tolerate Mestinon due to diarrhea, not a good candidate for prednisone due to diabetes insulin-dependent, insurance would not cover CellCept, with Imuran, had significant liver enzyme elevation (stopped Imuran December 2018, in January 2019 after discontinuation of Imuran, liver enzymes normalized -Continue IVIG 1g/kg (break up in 2 days, through [facility]) (First IVIG treatment was on  November 12, 13,  14, 2018 for total of 2 g/kg, second infusion was on July 14, 2017 2018, 1 g/kg; February 12, 13 2019, 1 g/kg; preauthorization is good until May 20, 2018 through intra-fusion, last infusion was on January 06, 2018, he continues IVIG infusion through home health specialty pharmacy, 1g/kg divided into two days) -Waive from [facility] for IVIG at reduced cost expired, is a few days late getting his January infusion, has reapplied for waiver, waiting to hear, was last given dose in December, will have our office reach out to [facility] to ensure efficiency of getting patient restarted -Overall, seems to be doing very well, clinically, no signs of exacerbation noted -Follow-up in 6 months or sooner if needed  I spent 15 minutes with the patient. 50% of this time was spent discussing his plan of care.   Butler Denmark, AGNP-C, DNP 08/19/2019, 11:58 AM [facility] [address], [place], [place] [address] [phone]

## 2019-08-19 ENCOUNTER — Other Ambulatory Visit: Payer: Self-pay

## 2019-08-19 ENCOUNTER — Encounter: Payer: Self-pay | Admitting: Neurology

## 2019-08-19 ENCOUNTER — Ambulatory Visit: Payer: Medicare Other | Admitting: Neurology

## 2019-08-19 ENCOUNTER — Telehealth: Payer: Self-pay | Admitting: Neurology

## 2019-08-19 VITALS — BP 124/68 | HR 60 | Temp 98.3°F | Ht 71.0 in | Wt 188.0 lb

## 2019-08-19 DIAGNOSIS — G7000 Myasthenia gravis without (acute) exacerbation: Secondary | ICD-10-CM | POA: Diagnosis not present

## 2019-08-19 NOTE — Telephone Encounter (Signed)
I called tiffany with [facility] [phone], she will check on this pt when she can today and let me know.

## 2019-08-19 NOTE — Patient Instructions (Signed)
Please continue working with [facility] to get IVIG at home, please let me know once the waiver approved. See you in 6 months

## 2019-08-19 NOTE — Telephone Encounter (Signed)
I saw the patient today, he receives IVIG from [facility], his January infusion is late, he had a waiver to receive IVIG at reduced cost, the waiver expired without his knowledge, he has reapplied, is waiting to hear. Can you call and check with them, make sure he isn't delayed too long. Please call patient afterwards to give him an update. Thank you

## 2019-08-23 NOTE — Telephone Encounter (Signed)
I called pt and relayed to him after speaking to Ria Comment, [facility] that his appication for awiver was approved until 08-19-20. (100%). If he wants more details then to call his advocate jennifer.  He verbalized understanding and I relayed to SS/NP as well.

## 2019-08-31 NOTE — Progress Notes (Signed)
I have reviewed and agreed above plan. 

## 2019-11-26 ENCOUNTER — Telehealth: Payer: Self-pay | Admitting: Neurology

## 2019-11-26 NOTE — Telephone Encounter (Signed)
Pt called stating that he is needing to speak to the RN. He states that [facility] is needing a PA for his Infusion's Please advise.

## 2019-11-29 NOTE — Telephone Encounter (Signed)
Spoke to pt and he is needing PA for his IVIG. I called and LMVM for Thereasa Distance, his advocate re: to pt.

## 2019-11-30 NOTE — Telephone Encounter (Signed)
I LMVM for [place] (PA dept) that was calling re: pt needing PA for IVIG.

## 2019-11-30 NOTE — Telephone Encounter (Signed)
Pt has called for an update re: his infusion.  The message from [facility] @ 10:35 was relayed to him, pt would like a call once [place], [place] is updated

## 2019-11-30 NOTE — Telephone Encounter (Signed)
I LMVM for Clent [place] [phone] again for pts IVIG PA.

## 2019-12-01 NOTE — Telephone Encounter (Signed)
I called pt and LMVM for him that did reach someone at [facility], she was ble to speak to intake in [place], and they needed updated clinical information.  I did fax with confirmation received [id number].  (I did mention that I placed on fax cover sheet 2-3 months, when should have been 3 wk.  (just FYI).

## 2019-12-01 NOTE — Telephone Encounter (Signed)
I called [facility] [phone], I spoke to [employer].  She stated that [facility] was the office I needed to speak with, she was able to reach them and they relayed that updated clinical was what was needed and fax to [phone] attn: intake team.  I faxed last ofv note 08-19-19 and phone message relating to waiver. Iincluding last IVIG infusion was 3wk ago per pt.

## 2019-12-01 NOTE — Telephone Encounter (Signed)
Fax confirmation received.  Last dose was 3 wks past per pt.

## 2020-02-17 ENCOUNTER — Other Ambulatory Visit: Payer: Self-pay

## 2020-02-17 ENCOUNTER — Ambulatory Visit: Payer: Medicare Other | Admitting: Neurology

## 2020-02-17 ENCOUNTER — Encounter: Payer: Self-pay | Admitting: Neurology

## 2020-02-17 VITALS — BP 121/68 | HR 62 | Ht 71.0 in | Wt 186.4 lb

## 2020-02-17 DIAGNOSIS — G7000 Myasthenia gravis without (acute) exacerbation: Secondary | ICD-10-CM

## 2020-02-17 NOTE — Progress Notes (Signed)
PATIENT: Bob Warren DOB: 09-Jan-1946  REASON FOR VISIT: follow up HISTORY FROM: patient  HISTORY OF PRESENT ILLNESS: Today 02/17/20  HISTORY Ludie Pavlik a 74 years old right-handed male, seen in refer by my colleague Dr. Jaynee Eagles to continue follow-up for his myasthenia gravis.  He was seen by Dr. Jaynee Eagles on February 21 2017 as a referral from [name], MD for left upper eyelid droop.  He has past medical history of type 2 diabetes, hypertension, hyperlipidemia.   He noted left eyelid droopy around February 08 2017, intermittent, worse at nighttime, fatigue, at its worst, left upper eyelid would cover half of the left eye, with mild blocking of his left vision. he denies double vision, no swallowing difficulty, no limb muscle weakness noted.  Since initial visit, laboratory evaluations in July 2018 showed positive acetylcholine modulating antibody, binding antibody, normal TSH, CPK, CBC, CMP with exception of mild elevated glucose 147,  He was also getting a prescription of Mestinon 60 mg 3 times a day, he tolerated the medication well, with Mestinon treatment, he barely notice any significant droopy eyelid. Personally reviewed MRI of the brain August 2018, moderate atrophy, supratentorium small vessel disease. 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  Repetitive nerve stimulation March 06 2017 of right abductor digital minimum show no significant abnormality  On today's examination, he was noted to have mild double vision, bulbar, proximal upper and lower extremity weakness,   UPDATE May 07 2017:  He was admitted to the hospital in September for 2018, complains of generalized weakness, gait abnormality, was diagnosed with positive rhinovirus by respiratory PCR, received IVIG treatment, showed moderate improvement,  I reviewed the laboratory evaluation in 2018, negative RPR,  glucose was elevated 160, fasting lipid profile showed LDL of 87, cholesterol 151, A1c was 7.4, normal TSH,  Laboratory evaluation in October 2018 from his primary care doctor, normal WBC 5, hemoglobin was 12.5, CMP showed elevated glucose of 198, creatinine of 0.8, AST was mildly elevated 89, alkaline phosphate was 128, TSH was mildly elevated 3.4, urine analysis was negative  Chest x-ray in April 01 2017 showed no significant abnormality,  Since hospital admission on April 05 2017, he continued to feel weak, nauseous, difficulty walking, frequent urination about once hour, also complains of elevated glucose 160s  UPDATE Dec 6th 2018: CT chest on November 01/15/2017 showed no thymus abnormality, area of scarring and atelectasis in the inferior lingular, and left lower lobe He feel weaker, all over, no double vision, no droopy eye lid, word finding difficulty, no dysphagia, no SOB,   He is taking Mestinon 60 mg 3 times a day, complains of frequent diarrhea, feeling fatigued,  He received IVIG 2 g/kg on November 12, 13, 14, with mild improvement, planning on to receive it again on December 11, and 12, he hopes to transfer his IVIG treatment locally.  UPDATE October 02 2017: He was admitted to [facility] on August 04, 2017, at his worst, Mr. Gann stated he could not move, mild dysphagia, per hospital admission, motor strength was 4 out of 5 throughout, he was treated with IVIG, which did help his symptoms,  Last ivig on Feb 12, 13th 2019, will have it monthly, he is no longer taking Mestinon due to GI side effect, Imuran was stopped in December 2018 due to abnormal liver functional test,  Repeat laboratory evaluation on August 05, 2017 showed normalization of AST, ALT,  alkaline phosphate, continue mild elevated bilirubin,  His diabetes is under suboptimal control,A1C 7.7  He responded very well to IVIG treatment.  UPDATE January 08 2018: He is accompanied by his wife at  today's clinical visit, complaining of not feeling well, generalized weakness, chest area burning sensation, he does have acid reflux, has taking a lot of time, was seen by GI yesterday, was given a different prescription, he also noticed mild swallowing difficulties, denies breathing difficulties,  This morning his glucose level was only 56, only eat cornflakes cereal his dinner and breakfast, he was insulin shots, last IVIG was on January 06, 2018, tolerated infusion well, will change to home infusion later  He denies double vision, no limb muscle weakness  UPDATE Sept 26 2019: I reviewed discharge summary on February 03, 2018, he was discharged in July 2019 from [facility] for acute GI bleeding his pancytopenia, was admitted to the hospital on January 26, 2018 for weakness, difficulty ambulating, he was found to be tachycardia, febrile temperature of 102, ulceration of right foot, chest x-ray was unremarkable, elevated lactic acid level, and pancytopenia, he was treated with sepsis due to right foot cellulitis, ultrasound of abdomen showed cirrhosis with small ascites on January 14, 2018, acute hepatitis panel was negative, negative ANA, anti-smooth muscle antibody, antimitochondrial antibody, antimicrosomal antibody, severe peripheral edema, ascites secondary to third spacing from low albumin, improved with diuresis,  He later was discharged to inpatient rehabilitation, back home since August 2019, he is now back to his baseline, he has no significant flare up of his MG symptoms, he received infusion through [facility] on Sept 2, 3 (1g/kg break into two days). He is to continue IVIG 1 g/kg divided dosing 2 days.  UPDATE February 03 2019: He is accompanied by his wife at today's visit, he has been receiving IVIG through [facility] in home infusion 1 g/kg break up in 2 days, tolerating it well, no flareup of myasthenia gravis symptoms, he wants to continue the infusion, not a good candidate for  prednisone due to history of diabetes, insurance does not cover CellCept, Imuran because abnormal liver functional test  Update August 19, 2019 SS: He is accompanied by his wife today, has been receiving IVIG per [facility] home infusion, delay in infusion this month, a waiver where he was able to get IVIG for reduced cost expired, had to resubmit waiver, was last given IVIG around the second week of December.  He continues to do well, denies ptosis, diplopia, trouble chewing or swallowing, no limb weakness.  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  He is retired, loves reading.  Update February 17, 2020 SS: Here with his wife, overall, doing really well. No new or worsening symptoms.  Remains on IVIG with [facility] infusion, every 4 weeks 1g/kg divided over 2 days. Tolerating well, maybe slightly weaker before next infusion.  No falls, uses cane when he goes out, is driving, enjoys reading/going to [employer].  Diabetes doing well, A1c 6.5. Sees PCP for lab work.  REVIEW OF SYSTEMS: Out of a complete 14 system review of symptoms, the patient complains only of the following symptoms, and all other reviewed systems are negative.  Walking difficulty  ALLERGIES: Allergies  Allergen Reactions   Imuran [Azathioprine] Other (See Comments)    Abnormal LIver functional    Lisinopril Cough    HOME MEDICATIONS: Outpatient Medications Prior to Visit  Medication Sig Dispense Refill   atorvastatin (LIPITOR) 20 MG tablet  Take 20 mg by mouth daily.     collagenase (SANTYL) ointment Apply topically daily. 15 g 0   diltiazem (CARDIZEM CD) 120 MG 24 hr capsule Take 1 capsule (120 mg total) by mouth daily.     fluticasone (FLONASE) 50 MCG/ACT nasal spray Place 1 spray into both nostrils as needed for allergies or rhinitis.     furosemide (LASIX) 20 MG tablet Take 3 tablets (60 mg total) by mouth 2 (two) times daily. 30 tablet    insulin aspart (NOVOLOG) 100 UNIT/ML injection Inject  0-9 Units into the skin 3 (three) times daily with meals. 10 mL 11   insulin glargine (LANTUS) 100 UNIT/ML injection Inject 0.14 mLs (14 Units total) into the skin at bedtime. 10 mL 11   losartan (COZAAR) 25 MG tablet Take 25 mg by mouth daily.     metFORMIN (GLUCOPHAGE) 850 MG tablet Take 850 mg by mouth 2 (two) times daily with a meal.     potassium chloride (KLOR-CON) 20 MEQ packet Take 40 mEq by mouth daily.     traMADol (ULTRAM) 50 MG tablet Take 1 tablet (50 mg total) by mouth every 6 (six) hours as needed for moderate pain. 30 tablet 0   No facility-administered medications prior to visit.    PAST MEDICAL HISTORY: Past Medical History:  Diagnosis Date   Diabetes ([place])    High cholesterol    Hypertension    Myasthenia gravis ([place])     PAST SURGICAL HISTORY: Past Surgical History:  Procedure Laterality Date   COLONOSCOPY     ESOPHAGOGASTRODUODENOSCOPY N/A 01/14/2018   Procedure: ESOPHAGOGASTRODUODENOSCOPY (EGD);  Surgeon: Virgel Manifold, MD;  Location: [facility] ENDOSCOPY;  Service: Endoscopy;  Laterality: N/A;   SKIN CANCER EXCISION Right 2016   Arm    FAMILY HISTORY: Family History  Problem Relation Age of Onset   Cancer Mother    Other Father        Gunshot wound   Diabetes Maternal Grandfather     SOCIAL HISTORY: Social History   Socioeconomic History   Marital status: Married    Spouse name: Not on file   Number of children: 2   Years of education: 12   Highest education level: Not on file  Occupational History   Occupation: Retired  Tobacco Use   Smoking status: Former Smoker   Smokeless tobacco: Never Used   Tobacco comment: Quit 25 yrs ago  Scientific laboratory technician Use: Never used  Substance and Sexual Activity   Alcohol use: No    Comment: Quit 25 yrs ago   Drug use: No   Sexual activity: Not on file  Other Topics Concern   Not on file  Social History Narrative   Lives at home w/ his wife   Right-handed   Caffeine:  3-6 cups of coffee per day   Social Determinants of Health   Financial Resource Strain:    Difficulty of Paying Living Expenses:   Food Insecurity:    Worried About Charity fundraiser in the Last Year:    Arboriculturist in the Last Year:   Transportation Needs:    Film/video editor (Medical):    Lack of Transportation (Non-Medical):   Physical Activity:    Days of Exercise per Week:    Minutes of Exercise per Session:   Stress:    Feeling of Stress :   Social Connections:    Frequency of Communication with Friends and Family:    Frequency  of Social Gatherings with Friends and Family:    Attends Religious Services:    Active Member of Clubs or Organizations:    Attends Archivist Meetings:    Marital Status:   Intimate Partner Violence:    Fear of Current or Ex-Partner:    Emotionally Abused:    Physically Abused:    Sexually Abused:    PHYSICAL EXAM  Vitals:   02/17/20 1106  BP: 121/68  Pulse: 62  Weight: 186 lb 6.4 oz (84.6 kg)  Height: 5' 11"  (1.803 m)   Body mass index is 26 kg/m.  Generalized: Well developed, in no acute distress   Neurological examination  Mentation: Alert oriented to time, place, history taking. Follows all commands speech and language fluent Cranial nerve II-XII: Pupils were equal round reactive to light. Extraocular movements were full, visual field were full on confrontational test. Facial sensation and strength were normal. Head turning and shoulder shrug were normal and symmetric. Motor: The motor testing reveals 5 over 5 strength of all 4 extremities. Good symmetric motor tone is noted throughout. No significant muscle weakness. Sensory: Sensory testing is intact to soft touch on all 4 extremities. No evidence of extinction is noted.  Coordination: Cerebellar testing reveals good finger-nose-finger and heel-to-shin bilaterally.  Gait and station: Gait is wide-based, cautious, able to stand from seated  position without pushoff, good turns, good stride, can walk independently Reflexes: Deep tendon reflexes are symmetric and normal bilaterally.   DIAGNOSTIC DATA (LABS, IMAGING, TESTING) - I reviewed patient records, labs, notes, testing and imaging myself where available.  Lab Results  Component Value Date   WBC 5.3 02/03/2018   HGB 8.8 (L) 02/03/2018   HCT 27.1 (L) 02/03/2018   MCV 110.2 (H) 02/03/2018   PLT 69 (L) 02/03/2018      Component Value Date/Time   NA 141 02/03/2018 0437   NA 140 07/03/2017 1253   K 3.1 (L) 02/03/2018 0437   CL 107 02/03/2018 0437   CO2 25 02/03/2018 0437   GLUCOSE 185 (H) 02/03/2018 0437   BUN 26 (H) 02/03/2018 0437   BUN 22 07/03/2017 1253   CREATININE 1.75 (H) 02/03/2018 0437   CALCIUM 8.1 (L) 02/03/2018 0437   PROT 6.1 (L) 02/03/2018 0437   PROT 6.2 07/03/2017 1253   ALBUMIN 1.9 (L) 02/03/2018 0437   ALBUMIN 2.8 (L) 07/03/2017 1253   AST 35 02/03/2018 0437   ALT 25 02/03/2018 0437   ALKPHOS 85 02/03/2018 0437   BILITOT 1.8 (H) 02/03/2018 0437   BILITOT 5.9 (H) 07/03/2017 1253   GFRNONAA 37 (L) 02/03/2018 0437   GFRAA 43 (L) 02/03/2018 0437   Lab Results  Component Value Date   CHOL 151 04/01/2017   HDL 48 04/01/2017   LDLCALC 87 04/01/2017   TRIG 82 04/01/2017   CHOLHDL 3.1 04/01/2017   Lab Results  Component Value Date   HGBA1C 7.7 (H) 07/03/2017   Lab Results  Component Value Date   VITAMINB12 444 01/28/2018   Lab Results  Component Value Date   TSH 2.607 01/13/2018    ASSESSMENT AND PLAN 74 y.o. year old male  has a past medical history of Diabetes ([place]), High cholesterol, Hypertension, and Myasthenia gravis ([place]). here with:  1.  Seropositive generalized myasthenia gravis  -Positive acetylcholine binding antibody in the past  -Was unable to tolerate Mestinon due to diarrhea, not a good candidate for prednisone due to diabetes insulin-dependent, insurance would not cover CellCept, with Imuran, had significant liver  enzyme elevation (stopped Imuran December 2018, in January 2019 after discontinuation of Imuran, liver enzymes normalized  -Continue IVIG 1g/kg (break up in 2 days, through [facility]) (First IVIG treatment was on November 12, 13, 14, 2018 for total of 2 g/kg, second infusion was on July 14, 2017 2018, 1 g/kg; February 12, 13 2019, 1 g/kg; preauthorization is good until May 20, 2018 through intra-fusion, last infusion was on January 06, 2018, he [facility], 1g/kg divided into two days)  -Overall, doing well, stable, no significant muscle weakness noted on today's exam, follow-up in 6 months or sooner if needed with Dr. Krista Blue  I spent 30 minutes of face-to-face and non-face-to-face time with patient.  This included previsit chart review, lab review, study review, order entry, electronic health record documentation, patient education.  Butler Denmark, AGNP-C, DNP 02/17/2020, 11:37 AM [facility] [address], [place], [place] [address] [phone]

## 2020-02-17 NOTE — Patient Instructions (Signed)
It was great to see you today! Continue IVIG See you back in 6 months

## 2020-03-17 ENCOUNTER — Encounter: Payer: Self-pay | Admitting: Neurology

## 2020-04-19 NOTE — Progress Notes (Signed)
I have reviewed and agreed above plan. 

## 2020-06-15 ENCOUNTER — Telehealth: Payer: Self-pay | Admitting: *Deleted

## 2020-06-15 NOTE — Telephone Encounter (Signed)
Received call from phone staff, Bob Warren with [facility] he needed updated information (clinical notes) [phone].  Fax confirmation received.

## 2020-08-24 ENCOUNTER — Ambulatory Visit: Payer: Medicare Other | Admitting: Neurology

## 2020-08-24 ENCOUNTER — Telehealth: Payer: Self-pay | Admitting: Neurology

## 2020-08-24 ENCOUNTER — Encounter: Payer: Self-pay | Admitting: Neurology

## 2020-08-24 VITALS — BP 110/54 | HR 78 | Ht 71.0 in | Wt 187.5 lb

## 2020-08-24 DIAGNOSIS — G7000 Myasthenia gravis without (acute) exacerbation: Secondary | ICD-10-CM

## 2020-08-24 NOTE — Telephone Encounter (Signed)
I called [facility] at [phone]. I spoke to pharmacist, Colletta Maryland, who took the verbal order to discontinue IVIG.

## 2020-08-24 NOTE — Progress Notes (Signed)
HISTORY OF PRESENT ILLNESS: Bob Warren a 75 years old right-handed male, seen in refer by my colleague Bob Warren to continue follow-up for his myasthenia gravis.  He was seen by Bob Warren on February 21 2017 as a referral from Bob Bott, MD for left upper eyelid droop.  He has past medical history of type 2 diabetes, hypertension, hyperlipidemia.   He noted left eyelid droopy around February 08 2017, intermittent, worse at nighttime, fatigue, at its worst, left upper eyelid would cover half of the left eye, with mild blocking of his left vision. he denies double vision, no swallowing difficulty, no limb muscle weakness noted.  Since initial visit, laboratory evaluations in July 2018 showed positive acetylcholine modulating antibody, binding antibody, normal TSH, CPK, CBC, CMP with exception of mild elevated glucose 147,  He was also getting a prescription of Mestinon 60 mg 3 times a day, he tolerated the medication well, with Mestinon treatment, he barely notice any significant droopy eyelid. Personally reviewed MRI of the brain August 2018, moderate atrophy, supratentorium small vessel disease. 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  Repetitive nerve stimulation March 06 2017 of right abductor digital minimum show no significant abnormality  On today's examination, he was noted to have mild double vision, bulbar, proximal upper and lower extremity weakness,   UPDATE May 07 2017:  He was admitted to the hospital in September for 2018, complains of generalized weakness, gait abnormality, was diagnosed with positive rhinovirus by respiratory PCR, received IVIG treatment, showed moderate improvement,  I reviewed the laboratory evaluation in 2018, negative RPR, glucose was elevated 160, fasting lipid profile showed LDL of 87, cholesterol 151, A1c was 7.4, normal TSH,  Laboratory  evaluation in October 2018 from his primary care doctor, normal WBC 5, hemoglobin was 12.5, CMP showed elevated glucose of 198, creatinine of 0.8, AST was mildly elevated 89, alkaline phosphate was 128, TSH was mildly elevated 3.4, urine analysis was negative  Chest x-ray in April 01 2017 showed no significant abnormality,  Since hospital admission on April 05 2017, he continued to feel weak, nauseous, difficulty walking, frequent urination about once hour, also complains of elevated glucose 160s  UPDATE Dec 6th 2018: CT chest on November 01/15/2017 showed no thymus abnormality, area of scarring and atelectasis in the inferior lingular, and left lower lobe He feel weaker, all over, no double vision, no droopy eye lid, word finding difficulty, no dysphagia, no SOB,   He is taking Mestinon 60 mg 3 times a day, complains of frequent diarrhea, feeling fatigued,  He received IVIG 2 g/kg on November 12, 13, 14, with mild improvement, planning on to receive it again on December 11, and 12, he hopes to transfer his IVIG treatment locally.  UPDATE October 02 2017: He was admitted to Bob Warren on August 04, 2017, at his worst, Bob Warren stated he could not move, mild dysphagia, per hospital admission, motor strength was 4 out of 5 throughout, he was treated with IVIG, which did help his symptoms,  Last ivig on Feb 12, 13th 2019, will have it monthly, he is no longer taking Mestinon due to GI side effect, Imuran was stopped in December 2018 due to abnormal liver functional test,  Repeat laboratory evaluation on August 05, 2017 showed normalization of AST, ALT, alkaline phosphate, continue mild elevated bilirubin,  His diabetes is under suboptimal control,A1C 7.7  He responded very well  to IVIG treatment.  UPDATE January 08 2018: He is accompanied by his wife at today's clinical visit, complaining of not feeling well, generalized weakness, chest area burning sensation, he does have acid  reflux, has taking a lot of time, was seen by GI yesterday, was given a different prescription, he also noticed mild swallowing difficulties, denies breathing difficulties,  This morning his glucose level was only 56, only eat cornflakes cereal his dinner and breakfast, he was insulin shots, last IVIG was on January 06, 2018, tolerated infusion well, will change to home infusion later  He denies double vision, no limb muscle weakness  UPDATE Sept 26 2019: I reviewed discharge summary on February 03, 2018, he was discharged in July 2019 from [facility] for acute GI bleeding his pancytopenia, was admitted to the hospital on January 26, 2018 for weakness, difficulty ambulating, he was found to be tachycardia, febrile temperature of 102, ulceration of right foot, chest x-ray was unremarkable, elevated lactic acid level, and pancytopenia, he was treated with sepsis due to right foot cellulitis, ultrasound of abdomen showed cirrhosis with small ascites on January 14, 2018, acute hepatitis panel was negative, negative ANA, anti-smooth muscle antibody, antimitochondrial antibody, antimicrosomal antibody, severe peripheral edema, ascites secondary to third spacing from low albumin, improved with diuresis,  He later was discharged to inpatient rehabilitation, back home since August 2019, he is now back to his baseline, he has no significant flare up of his MG symptoms, he received infusion through [facility] on Sept 2, 3 (1g/kg break into two days). He is to continue IVIG 1 g/kg divided dosing 2 days.  UPDATE February 03 2019: He is accompanied by his wife at today's visit, he has been receiving [facility]IG through [facility] in home infusion 1 g/kg break up in 2 days, tolerating it well, no flareup of myasthenia gravis symptoms, he wants to continue the infusion, not a good candidate for prednisone due to history of diabetes, insurance does not cover CellCept, Imuran because abnormal liver functional test  UPDATE  Aug 24 2020: He is accompanied by his wife at today's clinical visit, overall doing very well, no longer has droopy eyelid double vision, no weakness, he has continued his IVIG 1 g/kg every 4 weeks normal home infusion agents to [facility], last infusion was on January 17, 18, denied reaction with infusion, wife reported that she noticed him has mild increased generalized fatigue at the end of each infusion cycle  REVIEW OF SYSTEMS: Out of a complete 14 Warren review of symptoms, the patient complains only of the following symptoms, and all other reviewed systems are negative.  Walking difficulty  ALLERGIES: Allergies  Allergen Reactions  . Imuran [Azathioprine] Other (See Comments)    Abnormal LIver functional   . Lisinopril Cough    HOME MEDICATIONS: Outpatient Medications Prior to Visit  Medication Sig Dispense Refill  . atorvastatin (LIPITOR) 20 MG tablet Take 20 mg by mouth daily.    . collagenase (SANTYL) ointment Apply topically daily. 15 g 0  . diltiazem (CARDIZEM CD) 120 MG 24 hr capsule Take 1 capsule (120 mg total) by mouth daily.    . fluticasone (FLONASE) 50 MCG/ACT nasal spray Place 1 spray into both nostrils as needed for allergies or rhinitis.    . furosemide (LASIX) 20 MG tablet Take 3 tablets (60 mg total) by mouth 2 (two) times daily. 30 tablet   . insulin aspart (NOVOLOG) 100 UNIT/ML injection Inject 0-9 Units into the skin 3 (three) times daily with  meals. 10 mL 11  . insulin glargine (LANTUS) 100 UNIT/ML injection Inject 0.14 mLs (14 Units total) into the skin at bedtime. 10 mL 11  . losartan (COZAAR) 25 MG tablet Take 25 mg by mouth daily.    . metFORMIN (GLUCOPHAGE) 850 MG tablet Take 850 mg by mouth 2 (two) times daily with a meal.    . potassium chloride (KLOR-CON) 20 MEQ packet Take 40 mEq by mouth daily.    . traMADol (ULTRAM) 50 MG tablet Take 1 tablet (50 mg total) by mouth every 6 (six) hours as needed for moderate pain. 30 tablet 0  . GAMUNEX-C 20 GM/200ML  SOLN as directed.     No facility-administered medications prior to visit.    PAST MEDICAL HISTORY: Past Medical History:  Diagnosis Date  . Diabetes ([place])   . High cholesterol   . Hypertension   . Myasthenia gravis ([place])     PAST SURGICAL HISTORY: Past Surgical History:  Procedure Laterality Date  . COLONOSCOPY    . ESOPHAGOGASTRODUODENOSCOPY N/A 01/14/2018   Procedure: ESOPHAGOGASTRODUODENOSCOPY (EGD);  Surgeon: Virgel Manifold, MD;  [facility] END[facility];  Service: Endoscopy;  Laterality: N/A;  . SKIN CANCER EXCISION Right 2016   Arm    FAMILY HISTORY: Family History  Problem Relation Age of Onset  . Cancer Mother   . Other Father        Gunshot wound  . Diabetes Maternal Grandfather     SOCIAL HISTORY: Social History   Socioeconomic History  . Marital status: Married    Spouse name: Not on file  . Number of children: 2  . Years of education: 74  . Highest education level: Not on file  Occupational History  . Occupation: Retired  Tobacco Use  . Smoking status: Former Research scientist (life sciences)  . Smokeless tobacco: Never Used  . Tobacco comment: Quit 25 yrs ago  Vaping Use  . Vaping Use: Never used  Substance and Sexual Activity  . Alcohol use: No    Comment: Quit 25 yrs ago  . Drug use: No  . Sexual activity: Not on file  Other Topics Concern  . Not on file  Social History Narrative   Lives at home w/ his wife   Right-handed   Caffeine: 3-6 cups of coffee per day   Social Determinants of Health   Financial Resource Strain: Not on file  Food Insecurity: Not on file  Transportation Needs: Not on file  Physical Activity: Not on file  Stress: Not on file  Social Connections: Not on file  Intimate Partner Violence: Not on file   PHYSICAL EXAM  Vitals:   08/24/20 1051  BP: (!) 110/54  Pulse: 78  Weight: 187 lb 8 oz (85 kg)  Height: 5' 11"  (1.803 m)   Body mass index is 26.15 kg/m.   PHYSICAL EXAMNIATION:  Gen: NAD, conversant, well nourised,  well groomed                     Cardiovascular: Regular rate rhythm, no peripheral edema, warm, nontender. Eyes: Conjunctivae clear without exudates or hemorrhage Neck: Supple, no carotid bruits. Pulmonary: Clear to auscultation bilaterally   NEUROLOGICAL EXAM:  MENTAL STATUS: Speech/Cognition: Awake, alert, normal speech, oriented to history taking and casual conversation.  CRANIAL NERVES: CN II: Visual fields are full to confrontation.  Pupils are round equal and briskly reactive to light. CN III, IV, VI: extraocular movement are normal. No ptosis. CN V: Facial sensation is intact to light touch.  CN VII: Face is symmetric with normal eye closure and smile. CN VIII: Hearing is normal to casual conversation CN IX, X: Palate elevates symmetrically. Phonation is normal. CN XI: Head turning and shoulder shrug are intact CN XII: Tongue is midline with normal movements and no atrophy.  MOTOR: Muscle bulk and tone are normal. Muscle strength is normal.  REFLEXES: Reflexes are 2  and symmetric at the biceps, triceps, knees and ankles. Plantar responses are flexor.  SENSORY: Intact to light touch, pinprick, positional and vibratory sensation at fingers and toes.  COORDINATION: There is no trunk or limb ataxia.    GAIT/STANCE: He can get up from seated position arm crossed with 1 attempt, mildly unsteady,   DIAGNOSTIC DATA (LABS, IMAGING, TESTING) - I reviewed patient records, labs, notes, testing and imaging myself where available.  Lab Results  Component Value Date   WBC 5.3 02/03/2018   HGB 8.8 (L) 02/03/2018   HCT 27.1 (L) 02/03/2018   MCV 110.2 (H) 02/03/2018   PLT 69 (L) 02/03/2018      Component Value Date/Time   NA 141 02/03/2018 0437   NA 140 07/03/2017 1253   K 3.1 (L) 02/03/2018 0437   CL 107 02/03/2018 0437   CO2 25 02/03/2018 0437   GLUCOSE 185 (H) 02/03/2018 0437   BUN 26 (H) 02/03/2018 0437   BUN 22 07/03/2017 1253   CREATININE 1.75 (H) 02/03/2018  0437   CALCIUM 8.1 (L) 02/03/2018 0437   PROT 6.1 (L) 02/03/2018 0437   PROT 6.2 07/03/2017 1253   ALBUMIN 1.9 (L) 02/03/2018 0437   ALBUMIN 2.8 (L) 07/03/2017 1253   AST 35 02/03/2018 0437   ALT 25 02/03/2018 0437   ALKPHOS 85 02/03/2018 0437   BILITOT 1.8 (H) 02/03/2018 0437   BILITOT 5.9 (H) 07/03/2017 1253   GFRNONAA 37 (L) 02/03/2018 0437   GFRAA 43 (L) 02/03/2018 0437   Lab Results  Component Value Date   CHOL 151 04/01/2017   HDL 48 04/01/2017   LDLCALC 87 04/01/2017   TRIG 82 04/01/2017   CHOLHDL 3.1 04/01/2017   Lab Results  Component Value Date   HGBA1C 7.7 (H) 07/03/2017   Lab Results  Component Value Date   VITAMINB12 444 01/28/2018   Lab Results  Component Value Date   TSH 2.607 01/13/2018    ASSESSMENT AND PLAN 75 y.o. year old male  Seropositive generalized myasthenia gravis  Positive acetylcholine binding antibody in 2018,.  Presented with left ptosis, generalized fatigue, at is worse, was noted to have mild bulbar weakness, and then muscle weakness,  Mestinon, could not tolerate it, cause GI side effect, diarrhea  Not a good candidate for prednisone, due to diabetes, insulin-dependent,  Insurance would not cover CellCept, develop significant liver enzyme elevation taking Imuran (stopped Imuran December 2018, in January 2019 after discontinuation of Imuran, liver enzymes normalized)  Responded very well with IVIG treatment,   (First IVIG treatment was on November 12, 13, 14, 2018 for total of 2 g/kg, second infusion was on July 14, 2017 2018, 1 g/kg; February 12, 13 2019, 1 g/kg; preauthorization is good until May 20, 2018 through intra-fusion, last infusion was on January 06, 2018, [facility], 1g/kg divided into two days)   He is overall doing so well, no symptoms or signs of myasthenia gravis,  After discussed with patient and his wife, we decided to stop IVIG infusion for a while, return to  clinic in 3 months, he is to  contact our office for worsening symptoms, can always restarted IVIG if needed  Marcial Pacas, M.D. Ph.D.  [facility] [place], [name] [address] Phone: [phone] Fax:      [phone]

## 2020-08-24 NOTE — Telephone Encounter (Signed)
Please call to stop IVIG, we will put in new order if he needs it again, he is doing well now, will stop ivig now  He is getting monthly IVIG from [facility], Numbers to call [phone]

## 2020-11-23 ENCOUNTER — Other Ambulatory Visit: Payer: Self-pay

## 2020-11-23 ENCOUNTER — Ambulatory Visit: Payer: Medicare Other | Admitting: Neurology

## 2020-11-23 ENCOUNTER — Encounter: Payer: Self-pay | Admitting: Neurology

## 2020-11-23 VITALS — BP 131/59 | HR 81 | Ht 71.0 in | Wt 180.0 lb

## 2020-11-23 DIAGNOSIS — G7000 Myasthenia gravis without (acute) exacerbation: Secondary | ICD-10-CM | POA: Diagnosis not present

## 2020-11-23 NOTE — Progress Notes (Signed)
HISTORY OF PRESENT ILLNESS: Bob Warren a 75 years old right-handed male, seen in refer by my colleague Bob Warren to continue follow-up for his myasthenia gravis.  He was seen by Bob Warren on February 21 2017 as a referral from Bob Bott, MD for left upper eyelid droop.  He has past medical history of type 2 diabetes, hypertension, hyperlipidemia.   He noted left eyelid droopy around February 08 2017, intermittent, worse at nighttime, fatigue, at its worst, left upper eyelid would cover half of the left eye, with mild blocking of his left vision. he denies double vision, no swallowing difficulty, no limb muscle weakness noted.  Since initial visit, laboratory evaluations in July 2018 showed positive acetylcholine modulating antibody, binding antibody, normal TSH, CPK, CBC, CMP with exception of mild elevated glucose 147,  He was also getting a prescription of Mestinon 60 mg 3 times a day, he tolerated the medication well, with Mestinon treatment, he barely notice any significant droopy eyelid. Personally reviewed MRI of the brain August 2018, moderate atrophy, supratentorium small vessel disease. 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  Repetitive nerve stimulation March 06 2017 of right abductor digital minimum show no significant abnormality  On today's examination, he was noted to have mild double vision, bulbar, proximal upper and lower extremity weakness,   UPDATE May 07 2017:  He was admitted to the hospital in September for 2018, complains of generalized weakness, gait abnormality, was diagnosed with positive rhinovirus by respiratory PCR, received IVIG treatment, showed moderate improvement,  I reviewed the laboratory evaluation in 2018, negative RPR, glucose was elevated 160, fasting lipid profile showed LDL of 87, cholesterol 151, A1c was 7.4, normal TSH,  Laboratory  evaluation in October 2018 from his primary care doctor, normal WBC 5, hemoglobin was 12.5, CMP showed elevated glucose of 198, creatinine of 0.8, AST was mildly elevated 89, alkaline phosphate was 128, TSH was mildly elevated 3.4, urine analysis was negative  Chest x-ray in April 01 2017 showed no significant abnormality,  Since hospital admission on April 05 2017, he continued to feel weak, nauseous, difficulty walking, frequent urination about once hour, also complains of elevated glucose 160s  UPDATE Dec 6th 2018: CT chest on November 01/15/2017 showed no thymus abnormality, area of scarring and atelectasis in the inferior lingular, and left lower lobe He feel weaker, all over, no double vision, no droopy eye lid, word finding difficulty, no dysphagia, no SOB,   He is taking Mestinon 60 mg 3 times a day, complains of frequent diarrhea, feeling fatigued,  He received IVIG 2 g/kg on November 12, 13, 14, with mild improvement, planning on to receive it again on December 11, and 12, he hopes to transfer his IVIG treatment locally.  UPDATE October 02 2017: He was admitted to [facility] on August 04, 2017, at his worst, Bob Warren stated he could not move, mild dysphagia, per hospital admission, motor strength was 4 out of 5 throughout, he was treated with IVIG, which did help his symptoms,  Last ivig on Feb 12, 13th 2019, will have it monthly, he is no longer taking Mestinon due to GI side effect, Imuran was stopped in December 2018 due to abnormal liver functional test,  Repeat laboratory evaluation on August 05, 2017 showed normalization of AST, ALT, alkaline phosphate, continue mild elevated bilirubin,  His diabetes is under suboptimal control,A1C 7.7  He responded very well  to IVIG treatment.  UPDATE January 08 2018: He is accompanied by his wife at today's clinical visit, complaining of not feeling well, generalized weakness, chest area burning sensation, he does have acid  reflux, has taking a lot of time, was seen by GI yesterday, was given a different prescription, he also noticed mild swallowing difficulties, denies breathing difficulties,  This morning his glucose level was only 56, only eat cornflakes cereal his dinner and breakfast, he was insulin shots, last IVIG was on January 06, 2018, tolerated infusion well, will change to home infusion later  He denies double vision, no limb muscle weakness  UPDATE Sept 26 2019: I reviewed discharge summary on February 03, 2018, he was discharged in July 2019 from [facility] for acute GI bleeding his pancytopenia, was admitted to the hospital on January 26, 2018 for weakness, difficulty ambulating, he was found to be tachycardia, febrile temperature of 102, ulceration of right foot, chest x-ray was unremarkable, elevated lactic acid level, and pancytopenia, he was treated with sepsis due to right foot cellulitis, ultrasound of abdomen showed cirrhosis with small ascites on January 14, 2018, acute hepatitis panel was negative, negative ANA, anti-smooth muscle antibody, antimitochondrial antibody, antimicrosomal antibody, severe peripheral edema, ascites secondary to third spacing from low albumin, improved with diuresis,  He later was discharged to inpatient rehabilitation, back home since August 2019, he is now back to his baseline, he has no significant flare up of his MG symptoms, he received infusion through [facility] on Sept 2, 3 (1g/kg break into two days). He is to continue IVIG 1 g/kg divided dosing 2 days.  UPDATE February 03 2019: He is accompanied by his wife at today's visit, he has been receiving IVIG through [facility] in home infusion 1 g/kg break up in 2 days, tolerating it well, no flareup of myasthenia gravis symptoms, he wants to continue the infusion, not a good candidate for prednisone due to history of diabetes, insurance does not cover CellCept, Imuran because abnormal liver functional test  UPDATE  Aug 24 2020: He is accompanied by his wife at today's clinical visit, overall doing very well, no longer has droopy eyelid double vision, no weakness, he has continued his IVIG 1 g/kg every 4 weeks normal home infusion agents to [facility], last infusion was on January 17, 18, denied reaction with infusion, wife reported that she noticed him has mild increased generalized fatigue at the end of each infusion cycle  Update November 23, 2020 SS: Here today with his wife, has been off IVIG since January 2022.  Fortunately, has done well in the last several months.  He actually feels better off IVIG, can't identify what specifically.  Wishes to remain off IVIG, continue to monitor.  Denies any diplopia, ptosis, trouble swallowing, weakness of the arms or legs.  No falls.  He does have general fatigue with overexertion and shortness of breath. None are overly significant or concerning. Has orthopedic issues of right shoulder, right hip.   REVIEW OF SYSTEMS: Out of a complete 14 system review of symptoms, the patient complains only of the following symptoms, and all other reviewed systems are negative.  Walking difficulty  ALLERGIES: Allergies  Allergen Reactions  . Imuran [Azathioprine] Other (See Comments)    Abnormal LIver functional   . Lisinopril Cough    HOME MEDICATIONS: Outpatient Medications Prior to Visit  Medication Sig Dispense Refill  . atorvastatin (LIPITOR) 20 MG tablet Take 20 mg by mouth daily.    Marland Kitchen diltiazem (CARDIZEM  CD) 120 MG 24 hr capsule Take 1 capsule (120 mg total) by mouth daily.    . Ferrous Sulfate (IRON) 325 (65 Fe) MG TABS Take 1 tablet by mouth daily in the afternoon.    . fluticasone (FLONASE) 50 MCG/ACT nasal spray Place 1 spray into both nostrils as needed for allergies or rhinitis.    . furosemide (LASIX) 20 MG tablet Take 3 tablets (60 mg total) by mouth 2 (two) times daily. 30 tablet   . GAMUNEX-C 20 GM/200ML SOLN as directed.    . insulin aspart (NOVOLOG) 100  UNIT/ML injection Inject 0-9 Units into the skin 3 (three) times daily with meals. 10 mL 11  . insulin glargine (LANTUS) 100 UNIT/ML injection Inject 0.14 mLs (14 Units total) into the skin at bedtime. 10 mL 11  . losartan (COZAAR) 25 MG tablet Take 25 mg by mouth daily.    . metFORMIN (GLUCOPHAGE) 850 MG tablet Take 850 mg by mouth 2 (two) times daily with a meal.    . potassium chloride (KLOR-CON) 20 MEQ packet Take 40 mEq by mouth daily.    . traMADol (ULTRAM) 50 MG tablet Take 1 tablet (50 mg total) by mouth every 6 (six) hours as needed for moderate pain. 30 tablet 0  . collagenase (SANTYL) ointment Apply topically daily. 15 g 0   No facility-administered medications prior to visit.    PAST MEDICAL HISTORY: Past Medical History:  Diagnosis Date  . Diabetes ([place])   . High cholesterol   . Hypertension   . Myasthenia gravis ([place])     PAST SURGICAL HISTORY: Past Surgical History:  Procedure Laterality Date  . COLONOSCOPY    . ESOPHAGOGASTRODUODENOSCOPY N/A 01/14/2018   Procedure: ESOPHAGOGASTRODUODENOSCOPY (EGD);  Surgeon: Virgel Manifold, MD;  Location: [facility] ENDOSCOPY;  Service: Endoscopy;  Laterality: N/A;  . SKIN CANCER EXCISION Right 2016   Arm    FAMILY HISTORY: Family History  Problem Relation Age of Onset  . Cancer Mother   . Other Father        Gunshot wound  . Diabetes Maternal Grandfather     SOCIAL HISTORY: Social History   Socioeconomic History  . Marital status: Married    Spouse name: Not on file  . Number of children: 2  . Years of education: 73  . Highest education level: Not on file  Occupational History  . Occupation: Retired  Tobacco Use  . Smoking status: Former Research scientist (life sciences)  . Smokeless tobacco: Never Used  . Tobacco comment: Quit 25 yrs ago  Vaping Use  . Vaping Use: Never used  Substance and Sexual Activity  . Alcohol use: No    Comment: Quit 25 yrs ago  . Drug use: No  . Sexual activity: Not on file  Other Topics Concern  . Not on  file  Social History Narrative   Lives at home w/ his wife   Right-handed   Caffeine: 3-6 cups of coffee per day   Social Determinants of Health   Financial Resource Strain: Not on file  Food Insecurity: Not on file  Transportation Needs: Not on file  Physical Activity: Not on file  Stress: Not on file  Social Connections: Not on file  Intimate Partner Violence: Not on file   PHYSICAL EXAM  Vitals:   11/23/20 1126  BP: (!) 131/59  Pulse: 81  Weight: 180 lb (81.6 kg)  Height: 5' 11"  (1.803 m)   Body mass index is 25.1 kg/m.   PHYSICAL EXAMNIATION:  Gen: NAD,  conversant, well nourised, well groomed                     Cardiovascular: Regular rate rhythm, no peripheral edema, warm, nontender. Eyes: Conjunctivae clear without exudates or hemorrhage Pulmonary: Clear to auscultation bilaterally   NEUROLOGICAL EXAM:  MENTAL STATUS: Speech/Cognition: Awake, alert, normal speech, oriented to history taking and casual conversation.  CRANIAL NERVES: CN II: Visual fields are full to confrontation.  Pupils are round equal and briskly reactive to light. CN III, IV, VI: extraocular movement are normal. No ptosis. CN V: Facial sensation is intact to light touch. CN VII: Face is symmetric with normal eye closure and smile. CN XI: Head turning and shoulder shrug are intact  MOTOR: Good strength all extremities, no muscle weakness was noted, no cheek puff weakness, or eye open weakness  REFLEXES: Reflexes are 2 throughout  SENSORY: Intact to light touch  COORDINATION: Finger-nose-finger and heel-to-shin is normal bilaterally  GAIT/STANCE: He can get up from seated position arm crossed with 1 attempt, gait is slightly wide-based, but steady   DIAGNOSTIC DATA (LABS, IMAGING, TESTING) - I reviewed patient records, labs, notes, testing and imaging myself where available.  Lab Results  Component Value Date   WBC 5.3 02/03/2018   HGB 8.8 (L) 02/03/2018   HCT 27.1 (L)  02/03/2018   MCV 110.2 (H) 02/03/2018   PLT 69 (L) 02/03/2018      Component Value Date/Time   NA 141 02/03/2018 0437   NA 140 07/03/2017 1253   K 3.1 (L) 02/03/2018 0437   CL 107 02/03/2018 0437   CO2 25 02/03/2018 0437   GLUCOSE 185 (H) 02/03/2018 0437   BUN 26 (H) 02/03/2018 0437   BUN 22 07/03/2017 1253   CREATININE 1.75 (H) 02/03/2018 0437   CALCIUM 8.1 (L) 02/03/2018 0437   PROT 6.1 (L) 02/03/2018 0437   PROT 6.2 07/03/2017 1253   ALBUMIN 1.9 (L) 02/03/2018 0437   ALBUMIN 2.8 (L) 07/03/2017 1253   AST 35 02/03/2018 0437   ALT 25 02/03/2018 0437   ALKPHOS 85 02/03/2018 0437   BILITOT 1.8 (H) 02/03/2018 0437   BILITOT 5.9 (H) 07/03/2017 1253   GFRNONAA 37 (L) 02/03/2018 0437   GFRAA 43 (L) 02/03/2018 0437   Lab Results  Component Value Date   CHOL 151 04/01/2017   HDL 48 04/01/2017   LDLCALC 87 04/01/2017   TRIG 82 04/01/2017   CHOLHDL 3.1 04/01/2017   Lab Results  Component Value Date   HGBA1C 7.7 (H) 07/03/2017   Lab Results  Component Value Date   VITAMINB12 444 01/28/2018   Lab Results  Component Value Date   TSH 2.607 01/13/2018    ASSESSMENT AND PLAN 75 y.o. year old male  1. Seropositive generalized myasthenia gravis  Positive acetylcholine binding antibody in 2018,.  Presented with left ptosis, generalized fatigue, at is worse, was noted to have mild bulbar weakness, and then muscle weakness,  Mestinon, could not tolerate it, cause GI side effect, diarrhea  Not a good candidate for prednisone, due to diabetes, insulin-dependent,  Insurance would not cover CellCept, develop significant liver enzyme elevation taking Imuran (stopped Imuran December 2018, in January 2019 after discontinuation of Imuran, liver enzymes normalized)  Responded very well with IVIG treatment,   (First IVIG treatment was on November 12, 13, 14, 2018 for total of 2 g/kg, second infusion was on July 14, 2017 2018, 1 g/kg; February 12, 13 2019, 1 g/kg; preauthorization is  good until May 20, 2018 through intra-fusion, last infusion was on January 06, 2018, he [facility], 1g/kg divided into two days)  -Has been off IVIG since January 2022, no return or worsening of symptoms, he will continue to remain off IVIG, contact our office at onset of any symptoms worsening, we can always restart IVIG, appears to be doing very well, stable, follow-up in 6 to 8 months or sooner if needed  I spent 30 minutes of face-to-face and non-face-to-face time with patient.  This included previsit chart review, reviewing MG history, at worst symptoms, ensuring symptoms remain stable, no worse off IVIG, educating about what to watch for as he remains off IVIG.   Evangeline Dakin, DNP  [facility] [address], [place], [place] [address] [phone]

## 2020-11-23 NOTE — Patient Instructions (Signed)
Monitor symptoms, call for worsening symptoms See you back in 6-8 months

## 2021-01-09 HISTORY — PX: ESOPHAGOGASTRODUODENOSCOPY: SHX1529

## 2021-01-09 HISTORY — PX: COLONOSCOPY: SHX174

## 2021-05-31 ENCOUNTER — Encounter: Payer: Self-pay | Admitting: Neurology

## 2021-05-31 ENCOUNTER — Ambulatory Visit: Payer: Medicare Other | Admitting: Neurology

## 2021-05-31 ENCOUNTER — Other Ambulatory Visit: Payer: Self-pay

## 2021-05-31 VITALS — BP 134/60 | HR 60 | Ht 71.0 in | Wt 182.0 lb

## 2021-05-31 DIAGNOSIS — G7000 Myasthenia gravis without (acute) exacerbation: Secondary | ICD-10-CM | POA: Diagnosis not present

## 2021-05-31 NOTE — Progress Notes (Signed)
HISTORY OF PRESENT ILLNESS: Bob Warren is a 75 years old right-handed male, seen in refer by  my colleague Dr. Jaynee Warren to continue follow-up for his myasthenia gravis.   He was seen by Dr. Jaynee Warren on February 21 2017 as a referral from Bob Bott, MD for left upper eyelid droop.   He has past medical history of type 2 diabetes, hypertension, hyperlipidemia.    He noted left eyelid droopy around February 08 2017, intermittent, worse at nighttime, fatigue, at its worst, left upper eyelid would cover half of the left eye, with mild blocking of his left vision. he denies double vision, no swallowing difficulty, no limb muscle weakness noted.   Since initial visit, laboratory evaluations in July 2018 showed positive acetylcholine modulating antibody, binding antibody, normal TSH, CPK, CBC, CMP with exception of mild elevated glucose 147,    He was also getting a prescription of Mestinon 60 mg 3 times a day, he tolerated the medication well, with Mestinon treatment, he barely notice any significant droopy eyelid. Personally reviewed MRI of the brain August 2018, moderate atrophy, supratentorium small vessel disease. 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  Repetitive nerve stimulation March 06 2017 of right abductor digital minimum show no significant abnormality   On today's examination, he was noted to have mild double vision, bulbar, proximal upper and lower extremity weakness,    UPDATE May 07 2017:  He was admitted to the hospital in September for 2018, complains of generalized weakness, gait abnormality, was diagnosed with positive rhinovirus by respiratory PCR, received IVIG treatment, showed moderate improvement,   I reviewed the laboratory evaluation in 2018, negative RPR, glucose was elevated 160, fasting lipid profile showed LDL of 87, cholesterol 151, A1c was 7.4, normal TSH,   Laboratory  evaluation in October 2018 from his primary care doctor, normal WBC 5, hemoglobin was 12.5, CMP showed elevated glucose of 198, creatinine of 0.8, AST was mildly elevated 89, alkaline phosphate was 128, TSH was mildly elevated 3.4, urine analysis was negative   Chest x-ray in April 01 2017 showed no significant abnormality,   Since hospital admission on April 05 2017, he continued to feel weak, nauseous, difficulty walking, frequent urination about once hour, also complains of elevated glucose 160s   UPDATE Dec 6th 2018: CT chest on November 01/15/2017 showed no thymus abnormality, area of scarring and atelectasis in the inferior lingular, and left lower lobe He feel weaker, all over, no double vision, no droopy eye lid, word finding difficulty, no dysphagia, no SOB,    He is taking Mestinon 60 mg 3 times a day, complains of frequent diarrhea, feeling fatigued,   He received IVIG 2 g/kg on November 12, 13, 14, with mild improvement, planning on to receive it again on December 11, and 12, he hopes to transfer his IVIG treatment locally.   UPDATE October 02 2017: He was admitted to [facility] on August 04, 2017, at his worst, Bob Warren stated he could not move, mild dysphagia, per hospital admission, motor strength was 4 out of 5 throughout, he was treated with IVIG, which did help his symptoms,   Last ivig on Feb 12, 13th 2019, will have it monthly, he is no longer taking Mestinon due to GI side effect, Imuran was stopped in December 2018 due to abnormal liver functional test,   Repeat laboratory evaluation on August 05, 2017 showed normalization of AST,  ALT, alkaline phosphate, continue mild elevated bilirubin,   His diabetes is under suboptimal control,A1C 7.7   He responded very well to IVIG treatment.   UPDATE January 08 2018: He is accompanied by his wife at today's clinical visit, complaining of not feeling well, generalized weakness, chest area burning sensation, he does have acid  reflux, has taking a lot of time, was seen by GI yesterday, was given a different prescription, he also noticed mild swallowing difficulties, denies breathing difficulties,   This morning his glucose level was only 56, only eat cornflakes cereal his dinner and breakfast, he was insulin shots, last IVIG was on January 06, 2018, tolerated infusion well, will change to home infusion later   He denies double vision, no limb muscle weakness   UPDATE Sept 26 2019: I reviewed discharge summary on February 03, 2018, he was discharged in July 2019 from [facility] for acute GI bleeding his pancytopenia, was admitted to the hospital on January 26, 2018 for weakness, difficulty ambulating, he was found to be tachycardia, febrile temperature of 102, ulceration of right foot, chest x-ray was unremarkable, elevated lactic acid level, and pancytopenia, he was treated with sepsis due to right foot cellulitis, ultrasound of abdomen showed cirrhosis with small ascites on January 14, 2018, acute hepatitis panel was negative, negative ANA, anti-smooth muscle antibody, antimitochondrial antibody, antimicrosomal antibody, severe peripheral edema, ascites secondary to third spacing from low albumin, improved with diuresis,   He later was discharged to inpatient rehabilitation, back home since August 2019, he is now back to his baseline, he has no significant flare up of his MG symptoms, he received infusion through [facility] on Sept 2, 3 (1g/kg break into two days).  He is to continue IVIG 1 g/kg divided dosing 2 days.   UPDATE February 03 2019: He is accompanied by his wife at today's visit, he has been receiving IVIG through [facility] in home infusion 1 g/kg break up in 2 days, tolerating it well, no flareup of myasthenia gravis symptoms, he wants to continue the infusion, not a good candidate for prednisone due to history of diabetes, insurance does not cover CellCept, Imuran because abnormal liver functional test  UPDATE  Aug 24 2020: He is accompanied by his wife at today's clinical visit, overall doing very well, no longer has droopy eyelid double vision, no weakness, he has continued his IVIG 1 g/kg every 4 weeks normal home infusion agents to [facility], last infusion was on January 17, 18, denied reaction with infusion, wife reported that she noticed him has mild increased generalized fatigue at the end of each infusion cycle  Update May 31, 2021: Is overall doing well, accompanied by his wife at today's visit, is off IVIG infusions since January 2022, no flareup of muscle weakness, in specific, denies double vision, ptosis, trouble swallowing, mild gait abnormality due to chronic low back pain, multiple joints pain,  REVIEW OF SYSTEMS: Out of a complete 14 system review of symptoms, the patient complains only of the following symptoms, and all other reviewed systems are negative.  Walking difficulty  ALLERGIES: Allergies  Allergen Reactions   Imuran [Azathioprine] Other (See Comments)    Abnormal LIver functional    Lisinopril Cough    HOME MEDICATIONS: Outpatient Medications Prior to Visit  Medication Sig Dispense Refill   atorvastatin (LIPITOR) 20 MG tablet Take 20 mg by mouth daily.     diltiazem (CARDIZEM CD) 120 MG 24 hr capsule Take 1 capsule (120 mg total) by mouth  daily.     Ferrous Sulfate (IRON) 325 (65 Fe) MG TABS Take 1 tablet by mouth daily in the afternoon.     fluticasone (FLONASE) 50 MCG/ACT nasal spray Place 1 spray into both nostrils as needed for allergies or rhinitis.     furosemide (LASIX) 20 MG tablet Take 3 tablets (60 mg total) by mouth 2 (two) times daily. 30 tablet    insulin aspart (NOVOLOG) 100 UNIT/ML injection Inject 0-9 Units into the skin 3 (three) times daily with meals. 10 mL 11   insulin glargine (LANTUS) 100 UNIT/ML injection Inject 0.14 mLs (14 Units total) into the skin at bedtime. 10 mL 11   losartan (COZAAR) 25 MG tablet Take 25 mg by mouth daily.      metFORMIN (GLUCOPHAGE) 850 MG tablet Take 850 mg by mouth 2 (two) times daily with a meal.     potassium chloride (KLOR-CON) 20 MEQ packet Take 40 mEq by mouth daily.     traMADol (ULTRAM) 50 MG tablet Take 1 tablet (50 mg total) by mouth every 6 (six) hours as needed for moderate pain. 30 tablet 0   GAMUNEX-C 20 GM/200ML SOLN as directed.     No facility-administered medications prior to visit.    PAST MEDICAL HISTORY: Past Medical History:  Diagnosis Date   Diabetes ([place])    High cholesterol    Hypertension    Myasthenia gravis ([place])     PAST SURGICAL HISTORY: Past Surgical History:  Procedure Laterality Date   COLONOSCOPY     ESOPHAGOGASTRODUODENOSCOPY N/A 01/14/2018   Procedure: ESOPHAGOGASTRODUODENOSCOPY (EGD);  Surgeon: Virgel Manifold, MD;  Location: [facility] ENDOSCOPY;  Service: Endoscopy;  Laterality: N/A;   SKIN CANCER EXCISION Right 2016   Arm    FAMILY HISTORY: Family History  Problem Relation Age of Onset   Cancer Mother    Other Father        Gunshot wound   Diabetes Maternal Grandfather     SOCIAL HISTORY: Social History   Socioeconomic History   Marital status: Married    Spouse name: Not on file   Number of children: 2   Years of education: 12   Highest education level: Not on file  Occupational History   Occupation: Retired  Tobacco Use   Smoking status: Former   Smokeless tobacco: Never   Tobacco comments:    Quit 25 yrs ago  Scientific laboratory technician Use: Never used  Substance and Sexual Activity   Alcohol use: No    Comment: Quit 25 yrs ago   Drug use: No   Sexual activity: Not on file  Other Topics Concern   Not on file  Social History Narrative   Lives at home w/ his wife   Right-handed   Caffeine: 3-6 cups of coffee per day   Social Determinants of Health   Financial Resource Strain: Not on file  Food Insecurity: Not on file  Transportation Needs: Not on file  Physical Activity: Not on file  Stress: Not on file  Social  Connections: Not on file  Intimate Partner Violence: Not on file   PHYSICAL EXAM  Vitals:   05/31/21 1113  BP: 134/60  Pulse: 60  Weight: 182 lb (82.6 kg)  Height: 5' 11"  (1.803 m)   Body mass index is 25.38 kg/m.   PHYSICAL EXAMNIATION:  Gen: NAD, conversant, well nourised, well groomed  Cardiovascular: Regular rate rhythm, no peripheral edema, warm, nontender. Eyes: Conjunctivae clear without exudates or hemorrhage Pulmonary: Clear to auscultation bilaterally   NEUROLOGICAL EXAM:  MENTAL STATUS: Speech/Cognition: Awake, alert, normal speech, oriented to history taking and casual conversation.  CRANIAL NERVES: CN II: Visual fields are full to confrontation.  Pupils are round equal and briskly reactive to light. CN III, IV, VI: extraocular movement are normal. No ptosis. CN V: Facial sensation is intact to light touch. CN VII: Face is symmetric with normal eye closure and smile. CN XI: Head turning and shoulder shrug are intact  MOTOR: Normal strength of bilateral upper and lower extremity proximal and distal muscles  REFLEXES: Reflexes present and symmetric  SENSORY: Intact to light touch  COORDINATION: Finger-nose-finger and heel-to-shin is normal bilaterally  GAIT/STANCE: Need push-up to get up from seated position, mildly antalgic, due to low back pain,  DIAGNOSTIC DATA (LABS, IMAGING, TESTING) - I reviewed patient records, labs, notes, testing and imaging myself where available.  Lab Results  Component Value Date   WBC 5.3 02/03/2018   HGB 8.8 (L) 02/03/2018   HCT 27.1 (L) 02/03/2018   MCV 110.2 (H) 02/03/2018   PLT 69 (L) 02/03/2018      Component Value Date/Time   NA 141 02/03/2018 0437   NA 140 07/03/2017 1253   K 3.1 (L) 02/03/2018 0437   CL 107 02/03/2018 0437   CO2 25 02/03/2018 0437   GLUCOSE 185 (H) 02/03/2018 0437   BUN 26 (H) 02/03/2018 0437   BUN 22 07/03/2017 1253   CREATININE 1.75 (H) 02/03/2018 0437   CALCIUM  8.1 (L) 02/03/2018 0437   PROT 6.1 (L) 02/03/2018 0437   PROT 6.2 07/03/2017 1253   ALBUMIN 1.9 (L) 02/03/2018 0437   ALBUMIN 2.8 (L) 07/03/2017 1253   AST 35 02/03/2018 0437   ALT 25 02/03/2018 0437   ALKPHOS 85 02/03/2018 0437   BILITOT 1.8 (H) 02/03/2018 0437   BILITOT 5.9 (H) 07/03/2017 1253   GFRNONAA 37 (L) 02/03/2018 0437   GFRAA 43 (L) 02/03/2018 0437   Lab Results  Component Value Date   CHOL 151 04/01/2017   HDL 48 04/01/2017   LDLCALC 87 04/01/2017   TRIG 82 04/01/2017   CHOLHDL 3.1 04/01/2017   Lab Results  Component Value Date   HGBA1C 7.7 (H) 07/03/2017   Lab Results  Component Value Date   VITAMINB12 444 01/28/2018   Lab Results  Component Value Date   TSH 2.607 01/13/2018    ASSESSMENT AND PLAN 75 y.o. year old male  Seropositive generalized myasthenia gravis  Positive acetylcholine binding antibody in 2018,.  Presented with left ptosis, generalized fatigue, at is worse, was noted to have mild bulbar weakness, and mild muscle weakness,  Mestinon, could not tolerate it, cause GI side effect, diarrhea  Not a good candidate for prednisone, due to diabetes, insulin-dependent,  Insurance would not cover CellCept, develop significant liver enzyme elevation taking Imuran (stopped Imuran December 2018, in January 2019 after discontinuation of Imuran, liver enzymes normalized)  Responded very well with IVIG treatment, initial treatment was in November 2018, followed by maintenance dose  He was doing very well clinically, stopped IVIG since January 2022, no flareup of his myasthenia gravis symptoms,  Repeat acetylcholine receptor antibody, thyroid functional test, will notify patient of result, only return to clinic for new issues,    Marcial Pacas, M.D. Ph.D.  [facility] [place], [place] [address] Phone: [phone] Fax:  [phone]    

## 2021-06-04 ENCOUNTER — Encounter: Payer: Self-pay | Admitting: Neurology

## 2021-06-07 LAB — ACETYLCHOLINE RECEPTOR AB, ALL
AChR Binding Ab, Serum: 0.66 nmol/L — ABNORMAL HIGH (ref 0.00–0.24)
Acetylchol Block Ab: 35 % — ABNORMAL HIGH (ref 0–25)

## 2021-06-07 LAB — TSH: TSH: 3.67 u[IU]/mL (ref 0.450–4.500)

## 2021-12-17 ENCOUNTER — Telehealth: Payer: Self-pay | Admitting: Gastroenterology

## 2021-12-17 NOTE — Telephone Encounter (Signed)
I don't see any procedures in his chart in Epic. I see a referral for severe iron deficiency anemia and Hgb of 5s. This patient needs a blood transfusion if they haven't had it already, usually with severity of anemia and Hgb this low needing transfusion this workup is often done as an inpatient.  Can you let me know who sent this referral and phone contact of referring MD, as I need to clarify the patient's status with the referring physician and if they have sent this patient to the ED for a blood transfusion or not yet and if they have had bleeding symptoms

## 2021-12-17 NOTE — Telephone Encounter (Signed)
Good Afternoon Dr. Havery Moros,  D.O.D 5/22 PM   Patient called wanting to schedule endo and colon procedures. Patient has a referral in for iron deficiency anemia. Patient stated he had his last procedures at [facility] on 6/14 of 2022. Patients records are in [place], will you please review and advise on scheduling?   Thank you.

## 2021-12-19 NOTE — Telephone Encounter (Signed)
I would need to personally speak with the referring provider about this patient for an ASAP referral.  If you can contact the patient, if he has not had a blood transfusion yet, then he needs to go to the hospital for blood transfusion and possible admission for this level of anemia - his Hgb is 5.2 based on last check in Epic, I have not seen anything otherwise. If he is not actively bleeding, he could possibly go to the hospital for a blood transfusion and have an outpatient workup done quickly, but most of the time for this level of anemia, the patients are admitted to the hospital for expedited evaluation. Without a blood transfusion he is not a candidate for an outpatient workup.   Can you please clarify if the patient has had a blood transfusion or not, and assuming not, please tell him to go the nearest ED for further evaluation and management. If he goes to [facility] hospital our service can take care of him there. Can you please update the patient's PCP.  If he has had a blood transfusion since the Hgb of 5.2 please let me know otherwise   Herbert Seta can you please help with this and contact the patient to clarify. Thanks

## 2021-12-19 NOTE — Telephone Encounter (Signed)
Dr. Havery Moros,  Patients referring provider called and wanted to check on the status of  patients referral. I advised her that I had spoke with patient and he was wanting to have a colon procedure done. She advised me that he was needing to  be seen for iron deficiency anemia and she did not say anything about patient having any transfusions.  Does patient need to be scheduled for an OV?  Please advise  Thank you.

## 2021-12-19 NOTE — Telephone Encounter (Signed)
Called and spoke with patient. He has been advised of Dr. Doyne Keel recommendations. Pt states that his PCP Dr. [name] has referred him to [place] for evaluation. Pt states that he has not had any blood transfusions. He denies any bleeding signs or symptoms. Denies any BRB, dizziness, or lightheadedness. Pt states that he has been having some SOB for the past 2 weeks. He has been advised that he will need to go to his nearest ED for evaluation and blood transfusion. I told pt that this is not something that is treated as outpatient and will likely require hospital admission. I told pt that I will contact his PCP and let them know this information as well. Pt verbalized understanding and had no concerns at the end of the call.   I attempted to reach Dr. Steva Ready office at [facility] twice. It states that their office is closed, although their business hours state 7 am - 6 pm. I was not able to leave a vm. I will attempt to reach Dr. Jodene Nam office again in the morning.

## 2021-12-20 NOTE — Telephone Encounter (Signed)
Thank you [place] appreciate the follow up on this patient

## 2021-12-20 NOTE — Telephone Encounter (Signed)
Called Dr. Jodene Nam office this morning. I spoke with Hassan Rowan, CMA. I informed her that we advised pt to go to the ED for possible blood transfusion and admission. Hassan Rowan was advised that pt would not be a candidate for outpatient evaluation with this level of anemia. Hassan Rowan states that she does not see where the pt went to the ED. She states that they also strongly advised pt to go to the ED also but he was hesitant. Hassan Rowan states that Dr. Heber [place] is out of the office today. Hassan Rowan states that she is going to contact the patient again with our recommendations. Hassan Rowan verbalized understanding and had no concerns at the end of the call.

## 2022-01-01 NOTE — Telephone Encounter (Signed)
Bob Warren from Dr. Jodene Nam office called 6/5 stating that patient had  blood transfusions done and that his levels seemed be be better than what they were before. She stated that she was going to send those records from the transfusions over to Dr. Havery Moros he could see them.

## 2022-01-01 NOTE — Telephone Encounter (Signed)
I reviewed his ED record - looks like he went there and got a few units of blood, post transfusion Hgb was 7.2. He should have had a follow up CBC since that time to make sure stable, if not please let him know he needs a repeat CBC. Also he should be on iron if he has not started it yet. He reportedly has not had any bleeding symptoms. Can be seen in the office by me, APP, or any other MD as he is not established. Can you see if any openings in the next week with anyone? Thanks

## 2022-01-01 NOTE — Telephone Encounter (Signed)
Patient is calling to follow up is asking what his next treatment will be requesting to schedule an appointment.

## 2022-01-02 NOTE — Telephone Encounter (Signed)
Called and spoke with patient. He states that he has not had any further labs since post transfusion labs. Pt has been advised to reach out to his PCP for repeat CBC prior to his appt here. Pt reports that he is taking iron daily. Pt states that he will either need an appt before 6/13 or after due to transportation. No availability prior to 6/13. Pt has been scheduled for a new patient appt with Dr. Bryan Lemma on Wednesday, 01/16/22 at 11:20 am. Patient has been advised to arrive at 11 am for check in. Pt is aware that I will mail his appt information, he confirmed address on file. Pt verbalized understanding and had no concerns at the end of the call.

## 2022-01-16 ENCOUNTER — Ambulatory Visit (INDEPENDENT_AMBULATORY_CARE_PROVIDER_SITE_OTHER): Payer: Medicare Other | Admitting: Gastroenterology

## 2022-01-16 ENCOUNTER — Encounter: Payer: Self-pay | Admitting: Gastroenterology

## 2022-01-16 ENCOUNTER — Other Ambulatory Visit (INDEPENDENT_AMBULATORY_CARE_PROVIDER_SITE_OTHER): Payer: Medicare Other

## 2022-01-16 VITALS — BP 132/70 | HR 57 | Ht 71.0 in | Wt 177.0 lb

## 2022-01-16 DIAGNOSIS — R195 Other fecal abnormalities: Secondary | ICD-10-CM | POA: Diagnosis not present

## 2022-01-16 DIAGNOSIS — K2270 Barrett's esophagus without dysplasia: Secondary | ICD-10-CM

## 2022-01-16 DIAGNOSIS — D509 Iron deficiency anemia, unspecified: Secondary | ICD-10-CM

## 2022-01-16 DIAGNOSIS — K2970 Gastritis, unspecified, without bleeding: Secondary | ICD-10-CM | POA: Diagnosis not present

## 2022-01-16 DIAGNOSIS — K2990 Gastroduodenitis, unspecified, without bleeding: Secondary | ICD-10-CM

## 2022-01-16 DIAGNOSIS — D696 Thrombocytopenia, unspecified: Secondary | ICD-10-CM

## 2022-01-16 LAB — CBC
HCT: 26.9 % — ABNORMAL LOW (ref 39.0–52.0)
Hemoglobin: 8.1 g/dL — ABNORMAL LOW (ref 13.0–17.0)
MCHC: 30 g/dL (ref 30.0–36.0)
MCV: 80.6 fl (ref 78.0–100.0)
Platelets: 76 10*3/uL — ABNORMAL LOW (ref 150.0–400.0)
RBC: 3.34 Mil/uL — ABNORMAL LOW (ref 4.22–5.81)
RDW: 26.4 % — ABNORMAL HIGH (ref 11.5–15.5)
WBC: 4.3 10*3/uL (ref 4.0–10.5)

## 2022-01-16 MED ORDER — CLENPIQ 10-3.5-12 MG-GM -GM/175ML PO SOLN: 1.0000 | Freq: Once | ORAL | 0 refills | Status: AC

## 2022-01-16 NOTE — Patient Instructions (Addendum)
Your provider has requested that you go to the basement level for lab work before leaving today. Press "B" on the elevator. The lab is located at the first door on the left as you exit the elevator.   We have sent the following medications to your pharmacy for you to pick up at your convenience: Clenpiq  You have been scheduled for an endoscopy and colonoscopy. Please follow the written instructions given to you at your visit today. Please pick up your prep supplies at the pharmacy within the next 1-3 days. If you use inhalers (even only as needed), please bring them with you on the day of your procedure.   If you are age 18 or older, your body mass index should be between 23-30. Your Body mass index is 24.69 kg/m. If this is out of the aforementioned range listed, please consider follow up with your Primary Care Provider.  ________________________________________________________  The [facility] providers would like to encourage you to use [facility] to communicate with providers for non-urgent requests or questions.  Due to long hold times on the telephone, sending your provider a message by [facility] may be a faster and more efficient way to get a response.  Please allow 48 business hours for a response.  Please remember that this is for non-urgent requests.  _______________________________________________________  Due to recent changes in healthcare laws, you may see the results of your imaging and laboratory studies on MyChart before your provider has had a chance to review them.  We understand that in some cases there may be results that are confusing or concerning to you. Not all laboratory results come back in the same time frame and the provider may be waiting for multiple results in order to interpret others.  Please give [place] 48 hours in order for your provider to thoroughly review all the results before contacting the office for clarification of your results.     Thank you for choosing me and  [facility].  [name], D.O.

## 2022-01-16 NOTE — Progress Notes (Signed)
Chief Complaint: Iron deficiency anemia   Referring Provider:     [name], MD   HPI:     Bob Warren is a 76 y.o. male with a history of diabetes, CAD, paroxysmal A-fib, hyperlipidemia, HTN, myasthenia gravis (not currently on IVIG), degenerative disc disease with chronic low back pain, referred to the Gastroenterology Clinic for evaluation of symptomatic iron deficiency anemia.   - 12/14/2021: Evaluated by [facility] for progressive DOE x1 month.  No melena, hematochezia.  Labs n/f H/H 5.1/18 with MCV 66.7.  PLT 74.  Ferritin 3.8, iron 269, TIBC 432, sat 62%.  Denies NSAIDs due to history of gastritis in 2019 - 12/20/2021: ER evaluation for IDA.  Hemodynamically stable. Hgb 5.1, MCV 67.  Normal CMP, INR 1.14.  FOBT negative.  Transfused 2 unit PRBCs with posttransfusion hemoglobin 7.2 and discharged home. - 12/21/2021: Follow-up with PCM, Dr. Heber Henning.  Was taking oral iron - 01/04/2022: H/H 8/26.6, MCV/RDW 75.8/26, PLT 102.  FOBT positive   Today, he states no hematochezia, melena. Does report improved energy and less DOE since blood transfusion.   No issue with sedation for prior procedures. No NSAIDs. No AC or antiplatelet therapy.    Hospital admission in 12/2017 for hematemesis and melena. Hgb 7.5, transfused 2 unit PRBCs --01/14/2018: EGD (Dr. Bonna Gains): LA Grade D esophagitis, moderate portal hypertensive gastropathy in the fundus with friability.  Treated with high-dose PPI.  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  Normal PV.  CBD 3 mm. - 01/29/2018: Ultrasound: Mild to moderate ascites  He was previously seen by Dr. Marian Sorrow at [facility] in [place] on 12/14/2020 for evaluation of anemia.  Hgb 6.5 in 08/2020, but 9.1 in 11/2020.  FOBT positive but no overt bleeding.  Was started on oral iron, but does not think he had a  blood transfusion or IV iron in that interim to account for the improvement.  Was scheduled for EGD/colonoscopy: - EGD (01/09/2021): Couple areas of ulcerated mucosa in distal esophagus.  Significant mucosal changes in distal esophagus that could represent Barrett's Esophagus.  1-1.5 cm mass lesion within the distal esophagus concerning for Barrett's nodular disease (path: Barrett's esophagus without dysplasia).  2 cm submucosal lesion in the gastric body, well-circumscribed with normal overlying mucosa.  Erosive gastritis in distal stomach (path: None H. pylori gastritis).  Normal duodenum.  Small hiatal hernia.  Recommended GI follow-up, repeat EGD, and referral for EUS, but these were never completed by patient. - Colonoscopy (01/09/2021): Poor prep.  Only able to advance to splenic flexure then procedure aborted    Has been taking oral iron for >6 months.   Endoscopic History: - Colonoscopy (2004): Polyps per patient. No report in EMR for review - Colonoscopy (2009): Normal per notes, but no official record in EMR for review - EGD/colonoscopy (12/2020): As above     Past Medical History:  Diagnosis Date   Arthritis    Diabetes ([place])    High cholesterol    Hypertension    Kidney stone    Myasthenia gravis [facility])      Past Surgical History:  Procedure Laterality Date   COLONOSCOPY     ESOPHAGOGASTRODUODENOSCOPY N/A 01/14/2018   Procedure: ESOPHAGOGASTRODUODENOSCOPY (EGD);  Surgeon: Virgel Manifold, MD;  Location: [facility] ENDOSCOPY;  Service: Endoscopy;  Laterality: N/A;   SKIN CANCER EXCISION Right  2016   Arm   Family History  Problem Relation Age of Onset   Breast cancer Mother    Other Father        Gunshot wound   Diabetes Maternal Grandfather    Heart disease Maternal Grandfather    Colon cancer Neg Hx    Esophageal cancer Neg Hx    Stomach cancer Neg Hx    Social History   Tobacco Use   Smoking status: Former   Smokeless tobacco: Never   Tobacco comments:     Quit 25 yrs ago  Vaping Use   Vaping Use: Never used  Substance Use Topics   Alcohol use: No    Comment: Quit 25 yrs ago   Drug use: No   Current Outpatient Medications  Medication Sig Dispense Refill   atorvastatin (LIPITOR) 20 MG tablet Take 20 mg by mouth daily.     diltiazem (CARDIZEM CD) 120 MG 24 hr capsule Take 1 capsule (120 mg total) by mouth daily.     Ferrous Sulfate (IRON) 325 (65 Fe) MG TABS Take 1 tablet by mouth daily in the afternoon.     fluticasone (FLONASE) 50 MCG/ACT nasal spray Place 1 spray into both nostrils as needed for allergies or rhinitis.     furosemide (LASIX) 20 MG tablet Take 3 tablets (60 mg total) by mouth 2 (two) times daily. 30 tablet    insulin aspart (NOVOLOG) 100 UNIT/ML injection Inject 0-9 Units into the skin 3 (three) times daily with meals. 10 mL 11   insulin glargine (LANTUS) 100 UNIT/ML injection Inject 0.14 mLs (14 Units total) into the skin at bedtime. 10 mL 11   losartan (COZAAR) 25 MG tablet Take 25 mg by mouth daily.     metFORMIN (GLUCOPHAGE) 850 MG tablet Take 850 mg by mouth 2 (two) times daily with a meal.     potassium chloride (KLOR-CON) 20 MEQ packet Take 40 mEq by mouth daily.     traMADol (ULTRAM) 50 MG tablet Take 1 tablet (50 mg total) by mouth every 6 (six) hours as needed for moderate pain. 30 tablet 0   No current facility-administered medications for this visit.   Allergies  Allergen Reactions   Imuran [Azathioprine] Other (See Comments)    Abnormal LIver functional    Lisinopril Cough     Review of Systems: All systems reviewed and negative except where noted in HPI.     Physical Exam:    Wt Readings from Last 3 Encounters:  01/16/22 177 lb (80.3 kg)  05/31/21 182 lb (82.6 kg)  11/23/20 180 lb (81.6 kg)    BP 132/70   Pulse (!) 57   Ht 5' 11"  (1.803 m)   Wt 177 lb (80.3 kg)   BMI 24.69 kg/m  Constitutional:  Pleasant, in no acute distress. Psychiatric: Normal mood and affect. Behavior is  normal. EENT: Pupils normal.  Conjunctivae are normal. No scleral icterus. Neck supple. No cervical LAD. Cardiovascular: 2/6 SEM. Normal rate, regular rhythm. 3+ pitting edema b/l LE to mid shins.  Pulmonary/chest: Effort normal and breath sounds normal. No wheezing, rales or rhonchi. Abdominal: Soft, nondistended, nontender. Bowel sounds active throughout. There are no masses palpable. No hepatomegaly. Neurological: Alert and oriented to person place and time. Skin: Skin is warm and dry. No rashes noted.   ASSESSMENT AND PLAN;   1) Iron Deficiency Anemia 2) Heme positive stool 3) History of gastritis  - Was able to review records from [place] after appointment today.  EGD with erosive esophagitis, Barrett's Esophagus with nodularity and possible masslike lesion, erosive gastritis, 2 cm submucosal gastric lesion.  Colonoscopy was aborted due to poor prep.  He has multiple potential etiologies for continued GI bleeding and IDA and given degree of symptomatic anemia, warrants expedited evaluation - Repeat CBC today. If Hgb <8, will need RBC transfusion prior to sedation - Expedited EGD/Colonoscopy on 01/21/2022 for diagnostic and potentially therapeutic intent - Extended 2-day bowel preparation due to prior poor prep - Hold iron 7 days from procedures  4) Barrett's Esophagus 5) Submucosal gastric lesion on previous EGD - EGD as above - May warrant EUS depending on EGD findings  6) History of ascites 7) Thrombocytopenia - Evaluate for portal hypertensive gastropathy, esophageal varices, rectal varices at time of EGD/colonoscopy as above - No recent abdominal imaging for review - Will warrant further evaluation to include imaging, extended labs, and possibly liver biopsy depending on endoscopic findings  The indications, risks, and benefits of EGD and colonoscopy were explained to the patient in detail. Risks include but are not limited to bleeding, perforation, adverse reaction to  medications, and cardiopulmonary compromise. Sequelae include but are not limited to the possibility of surgery, hospitalization, and mortality. The patient verbalized understanding and wished to proceed. All questions answered, referred to scheduler and bowel prep ordered. Further recommendations pending results of the exam.     Lavena Bullion, DO, FACG  01/16/2022, 11:43 AM   [name], MD

## 2022-01-21 ENCOUNTER — Encounter: Payer: Self-pay | Admitting: Gastroenterology

## 2022-01-21 ENCOUNTER — Ambulatory Visit (AMBULATORY_SURGERY_CENTER): Payer: Medicare Other | Admitting: Gastroenterology

## 2022-01-21 VITALS — BP 151/74 | HR 62 | Temp 97.8°F | Resp 13 | Ht 71.0 in | Wt 177.0 lb

## 2022-01-21 DIAGNOSIS — R195 Other fecal abnormalities: Secondary | ICD-10-CM

## 2022-01-21 DIAGNOSIS — K2980 Duodenitis without bleeding: Secondary | ICD-10-CM

## 2022-01-21 DIAGNOSIS — K2270 Barrett's esophagus without dysplasia: Secondary | ICD-10-CM | POA: Diagnosis not present

## 2022-01-21 DIAGNOSIS — D120 Benign neoplasm of cecum: Secondary | ICD-10-CM

## 2022-01-21 DIAGNOSIS — K2970 Gastritis, unspecified, without bleeding: Secondary | ICD-10-CM

## 2022-01-21 DIAGNOSIS — D509 Iron deficiency anemia, unspecified: Secondary | ICD-10-CM | POA: Diagnosis not present

## 2022-01-21 DIAGNOSIS — K635 Polyp of colon: Secondary | ICD-10-CM | POA: Diagnosis not present

## 2022-01-21 DIAGNOSIS — K219 Gastro-esophageal reflux disease without esophagitis: Secondary | ICD-10-CM

## 2022-01-21 DIAGNOSIS — D696 Thrombocytopenia, unspecified: Secondary | ICD-10-CM

## 2022-01-21 DIAGNOSIS — K317 Polyp of stomach and duodenum: Secondary | ICD-10-CM

## 2022-01-21 DIAGNOSIS — I8500 Esophageal varices without bleeding: Secondary | ICD-10-CM | POA: Diagnosis not present

## 2022-01-21 DIAGNOSIS — D124 Benign neoplasm of descending colon: Secondary | ICD-10-CM

## 2022-01-21 DIAGNOSIS — D125 Benign neoplasm of sigmoid colon: Secondary | ICD-10-CM | POA: Diagnosis not present

## 2022-01-21 DIAGNOSIS — D122 Benign neoplasm of ascending colon: Secondary | ICD-10-CM

## 2022-01-21 DIAGNOSIS — K3189 Other diseases of stomach and duodenum: Secondary | ICD-10-CM | POA: Diagnosis not present

## 2022-01-21 MED ORDER — SODIUM CHLORIDE 0.9 % IV SOLN: 500.0000 mL | Freq: Once | INTRAVENOUS | Status: DC

## 2022-01-22 ENCOUNTER — Telehealth: Payer: Self-pay | Admitting: *Deleted

## 2022-01-25 ENCOUNTER — Telehealth: Payer: Self-pay

## 2022-01-25 NOTE — Telephone Encounter (Signed)
Referral faxed to [facility] for eval and possible endoscopic intervention for Barrett's esophagus with nodularity as requested on 6/26 EGD report.

## 2022-03-01 ENCOUNTER — Ambulatory Visit: Payer: Medicare Other | Admitting: Gastroenterology

## 2022-04-22 ENCOUNTER — Ambulatory Visit: Payer: Medicare Other | Admitting: Gastroenterology

## 2022-05-28 ENCOUNTER — Encounter: Payer: Self-pay | Admitting: Gastroenterology

## 2022-05-28 ENCOUNTER — Ambulatory Visit: Payer: Medicare Other | Admitting: Gastroenterology

## 2022-05-28 ENCOUNTER — Other Ambulatory Visit (INDEPENDENT_AMBULATORY_CARE_PROVIDER_SITE_OTHER): Payer: Medicare Other

## 2022-05-28 VITALS — BP 130/70 | HR 80 | Ht 71.0 in | Wt 173.1 lb

## 2022-05-28 DIAGNOSIS — D509 Iron deficiency anemia, unspecified: Secondary | ICD-10-CM | POA: Diagnosis not present

## 2022-05-28 DIAGNOSIS — I8500 Esophageal varices without bleeding: Secondary | ICD-10-CM

## 2022-05-28 DIAGNOSIS — K766 Portal hypertension: Secondary | ICD-10-CM

## 2022-05-28 DIAGNOSIS — K219 Gastro-esophageal reflux disease without esophagitis: Secondary | ICD-10-CM

## 2022-05-28 DIAGNOSIS — K7460 Unspecified cirrhosis of liver: Secondary | ICD-10-CM

## 2022-05-28 DIAGNOSIS — K2270 Barrett's esophagus without dysplasia: Secondary | ICD-10-CM

## 2022-05-28 DIAGNOSIS — K3189 Other diseases of stomach and duodenum: Secondary | ICD-10-CM

## 2022-05-28 LAB — VITAMIN B12: Vitamin B-12: 169 pg/mL — ABNORMAL LOW (ref 211–911)

## 2022-05-28 LAB — IBC + FERRITIN
Ferritin: 13.5 ng/mL — ABNORMAL LOW (ref 22.0–322.0)
Iron: 47 ug/dL (ref 42–165)
Saturation Ratios: 12.3 % — ABNORMAL LOW (ref 20.0–50.0)
TIBC: 383.6 ug/dL (ref 250.0–450.0)
Transferrin: 274 mg/dL (ref 212.0–360.0)

## 2022-05-28 LAB — FOLATE: Folate: 17.9 ng/mL (ref >5.9)

## 2022-05-28 LAB — PROTIME-INR
INR: 1 ratio (ref 0.8–1.0)
Prothrombin Time: 11.5 s (ref 9.6–13.1)

## 2022-05-28 NOTE — Patient Instructions (Addendum)
Please follow up in 3 months. Give Bob Warren a call at [phone] to schedule an appointment.   Your provider has requested that you go to the basement level for lab work before leaving today. Press "B" on the elevator. The lab is located at the first door on the left as you exit the elevator.  You have been scheduled for an abdominal ultrasound at [facility] (1st floor of hospital) on 06/04/22 at 900 AM. Please arrive 30 minutes prior to your appointment for registration. Make certain not to have anything to eat or drink after midnight prior to your appointment. Should you need to reschedule your appointment, please contact radiology at [phone]. This test typically takes about 30 minutes to perform.   You have been scheduled for an endoscopy at [facility]. Please follow written instructions given to you at your visit today. If you use inhalers (even only as needed), please bring them with you on the day of your procedure.    If you are age 82 or older, your body mass index should be between 23-30. Your Body mass index is 24.15 kg/m. If this is out of the aforementioned range listed, please consider follow up with your Primary Care Provider.   __________________________________________________________  The [facility] providers would like to encourage you to use [facility] to communicate with providers for non-urgent requests or questions.  Due to long hold times on the telephone, sending your provider a message by [facility] may be a faster and more efficient way to get a response.  Please allow 48 business hours for a response.  Please remember that this is for non-urgent requests.   Due to recent changes in healthcare laws, you may see the results of your imaging and laboratory studies on MyChart before your provider has had a chance to review them.  We understand that in some cases there may be results that are confusing or concerning to you. Not all laboratory results come back in the same  time frame and the provider may be waiting for multiple results in order to interpret others.  Please give Bob Warren 48 hours in order for your provider to thoroughly review all the results before contacting the office for clarification of your results.   Thank you for choosing me and [facility].  [name], D.O.

## 2022-05-28 NOTE — H&P (View-Only) (Signed)
Chief Complaint:    Anemia, fatigue, GERD  GI History: 76 year old male with a history of diabetes, CAD, paroxysmal A-fib, hyperlipidemia, HTN, myasthenia gravis (not currently on IVIG), degenerative disc disease with chronic low back pain, initially seen in the GI clinic in 12/2021 for evaluation of symptomatic IDA.  - 12/14/2021: Evaluated by [facility] for progressive DOE x1 month.  No melena, hematochezia.  Labs n/f H/H 5.1/18 with MCV 66.7.  PLT 74.  Ferritin 3.8, iron 269, TIBC 432, sat 62%.  Denies NSAIDs due to history of gastritis in 2019 - 12/20/2021: ER evaluation for IDA.  Hemodynamically stable. Hgb 5.1, MCV 67.  Normal CMP, INR 1.14.  FOBT negative.  Transfused 2 unit PRBCs with posttransfusion hemoglobin 7.2 and discharged home. - 12/21/2021: Follow-up with PCM, Dr. Heber [place].  Was taking oral iron - 01/04/2022: H/H 8/26.6, MCV/RDW 75.8/26, PLT 102.  FOBT positive - 01/16/2022: Initial appointment in GI clinic.  No overt bleeding. - 01/21/2022: EGD: Grade 1 esophageal varices, C3-M6 nondysplastic Barrett's Esophagus with subtle 3-4 mm nodule in the lower esophagus.  Moderate gastritis with edema, erythema, friability (path: Benign).  5 mm antral polyp removed with forceps (path: Hyperplastic polyp).  Mild duodenitis (path: Benign).  Was referred to [facility] for further evaluation of nodular Barrett's Esophagus with underlying varices - 01/21/2022: Colonoscopy: 8 polyps ranging 2-8 mm (path: SSP x1, several tubular adenomas).  Normal TI.  Repeat 3 years - 02/28/2022: Evaluated by Dr. Mont Dutton at [facility] advanced to GI clinic. - 02/28/2022: EGD/EUS at [facility]: Nondysplastic Barrett's Esophagus without discrete nodule.   Grade 2 esophageal varices, portal hypertensive gastropathy.  Gastric antral vascular ectasia with bleeding but not treated by APC.  EUS notable for esophageal varices but no other significant pathology - 05/08/2022: H/H 10.3/31.7, MCV/RDW 89/16.5.  PLT 64.  Ferritin 12.6  Previously, has had a  following GI evaluations: - 12/2017: Hospital admission for hematemesis, melena, acute blood loss anemia.  Transfused 2 unit PRBCs - 01/14/2018: EGD (Dr. Bonna Gains): LA Grade D esophagitis, moderate portal hypertensive gastropathy in the fundus with friability.  Treated with high-dose PPI.  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  Normal PV.  CBD 3 mm. - 01/29/2018: Ultrasound: Mild to moderate ascites  - 12/14/2020: Evaluated by Dr. Marian Sorrow at [facility] in [facility] for anemia. Hgb 6.5 in 08/2020, but 9.1 in 11/2020.  FOBT positive but no overt bleeding.  Was started on oral iron, but does not think he had a blood transfusion or IV iron in that interim to account for the improvement.  Was scheduled for EGD/colonoscopy: - EGD (01/09/2021): Couple areas of ulcerated mucosa in distal esophagus.  Significant mucosal changes in distal esophagus that could represent Barrett's Esophagus.  1-1.5 cm mass lesion within the distal esophagus concerning for Barrett's nodular disease (path: Barrett's esophagus without dysplasia).  2 cm submucosal lesion in the gastric body, well-circumscribed with normal overlying mucosa.  Erosive gastritis in distal stomach (path: None H. pylori gastritis).  Normal duodenum.  Small hiatal hernia.  Recommended GI follow-up, repeat EGD, and referral for EUS, but these were never completed by patient. - Colonoscopy (01/09/2021): Poor prep.  Only able to advance to splenic flexure then procedure aborted    Endoscopic History: - Colonoscopy (2004): Polyps per patient. No report in EMR for review - Colonoscopy (2009): Normal per notes, but no official record in EMR for review - EGD/colonoscopy (12/2020):  As above - EGD (12/2021): As above - EGD/EUS (02/2022, [facility]): As above  HPI:     Patient is a 76 y.o. male presenting to the  Gastroenterology Clinic for follow-up.  Last seen by me on 01/16/2022, with subsequent EGD on 01/21/2022 and then referred to [place] clinic in August, as outlined above.  His main issue today is fatigue.  Taking oral iron daily without issue.  Has any overt GI bleeding.  GERD otherwise well controlled with Protonix.    Review of systems:     No chest pain, no SOB, no fevers, no urinary sx   Past Medical History:  Diagnosis Date   Arthritis    Diabetes ([place])    High cholesterol    Hypertension    Kidney stone    Myasthenia gravis ([place])     Patient's surgical history, family medical history, social history, medications and allergies were all reviewed in Epic    Current Outpatient Medications  Medication Sig Dispense Refill   amLODipine (NORVASC) 10 MG tablet Take 1 tablet by mouth daily.     atenolol (TENORMIN) 50 MG tablet Take 1 tablet by mouth daily.     atorvastatin (LIPITOR) 20 MG tablet Take 20 mg by mouth daily.     diltiazem (CARDIZEM CD) 120 MG 24 hr capsule Take 1 capsule (120 mg total) by mouth daily.     fluticasone (FLONASE) 50 MCG/ACT nasal spray Place 1 spray into both nostrils as needed for allergies or rhinitis.     furosemide (LASIX) 20 MG tablet Take 3 tablets (60 mg total) by mouth 2 (two) times daily. 30 tablet    insulin aspart (NOVOLOG) 100 UNIT/ML injection Inject 0-9 Units into the skin 3 (three) times daily with meals. 10 mL 11   insulin glargine (LANTUS) 100 UNIT/ML injection Inject 0.14 mLs (14 Units total) into the skin at bedtime. 10 mL 11   losartan (COZAAR) 25 MG tablet Take 25 mg by mouth daily.     metFORMIN (GLUCOPHAGE) 850 MG tablet Take 850 mg by mouth 2 (two) times daily with a meal.     oxybutynin (DITROPAN XL) 15 MG 24 hr tablet Take 15 mg by mouth daily.     pantoprazole (PROTONIX) 40 MG tablet Take 40 mg by mouth 2 (two) times daily.     potassium chloride (KLOR-CON) 20 MEQ packet Take 40 mEq by mouth daily.     traMADol (ULTRAM)  50 MG tablet Take 1 tablet (50 mg total) by mouth every 6 (six) hours as needed for moderate pain. 30 tablet 0   No current facility-administered medications for this visit.    Physical Exam:     BP 130/70   Pulse 80   Ht 5' 11"  (1.803 m)   Wt 173 lb 2 oz (78.5 kg)   BMI 24.15 kg/m   GENERAL:  Pleasant male in NAD PSYCH: : Cooperative, normal affect EENT:  conjunctiva pink, mucous membranes moist, neck supple without masses CARDIAC:  RRR, no murmur heard, no peripheral edema PULM: Normal respiratory effort, lungs CTA bilaterally, no wheezing ABDOMEN:  Nondistended, soft, nontender. No obvious masses, no hepatomegaly,  normal bowel sounds SKIN:  turgor, no lesions seen Musculoskeletal:  Normal muscle tone, normal strength NEURO: Alert and oriented x 3, no focal neurologic deficits   IMPRESSION and PLAN:    1) Gastric antral vascular ectasias 2) Normocytic Anemia with iron deficiency Recent CBC with normocytic anemia with H/H 10/31, but ferritin 12.6.  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 - We will proceed with extended serologic work-up to evaluate for concomitant liver disease - Discussed the role/utility of liver biopsy, and patient and family members do not want to proceed with that - RUQ [place] - Check AFP - Reevaluate esophageal varices times upper endoscopy as above.  If unchanged, repeat in 2 years for surveillance - Currently prescribed atenolol - Can continue Lasix  7) GERD 8) Short segment nondysplastic Barrett's  Esophagus Underwent EGD/EUS at [facility] in 02/2022.  No nodular appearance to the esophagus.  EUS portions otherwise unremarkable.  Esophageal biopsies confirmed nondysplastic Barrett's Esophagus - Continue PPI - Periodic Barrett's Esophagus surveillance can be conducted at the time of variceal surveillance studies   RTC in 3 months or sooner as needed     The indications, risks, and benefits of EGD were explained to the patient in detail. Risks include but are not limited to bleeding, perforation, adverse reaction to medications, and cardiopulmonary compromise. Sequelae include but are not limited to the possibility of surgery, hospitalization, and mortality. The patient verbalized understanding and wished to proceed. All questions answered, referred to scheduler. Further recommendations pending results of the exam.     Dominic Pea Talyn Eddie ,DO, FACG 05/28/2022, 1:42 PM

## 2022-05-28 NOTE — Progress Notes (Unsigned)
Chief Complaint:    Anemia, fatigue, GERD  GI History: 76 year old male with a history of diabetes, CAD, paroxysmal A-fib, hyperlipidemia, HTN, myasthenia gravis (not currently on IVIG), degenerative disc disease with chronic low back pain, initially seen in the GI clinic in 12/2021 for evaluation of symptomatic IDA.  - 12/14/2021: Evaluated by [facility] for progressive DOE x1 month.  No melena, hematochezia.  Labs n/f H/H 5.1/18 with MCV 66.7.  PLT 74.  Ferritin 3.8, iron 269, TIBC 432, sat 62%.  Denies NSAIDs due to history of gastritis in 2019 - 12/20/2021: ER evaluation for IDA.  Hemodynamically stable. Hgb 5.1, MCV 67.  Normal CMP, INR 1.14.  FOBT negative.  Transfused 2 unit PRBCs with posttransfusion hemoglobin 7.2 and discharged home. - 12/21/2021: Follow-up with PCM, Dr. Heber Moore.  Was taking oral iron - 01/04/2022: H/H 8/26.6, MCV/RDW 75.8/26, PLT 102.  FOBT positive - 01/16/2022: Initial appointment in GI clinic.  No overt bleeding. - 01/21/2022: EGD: Grade 1 esophageal varices, C3-M6 nondysplastic Barrett's Esophagus with subtle 3-4 mm nodule in the lower esophagus.  Moderate gastritis with edema, erythema, friability (path: Benign).  5 mm antral polyp removed with forceps (path: Hyperplastic polyp).  Mild duodenitis (path: Benign).  Was referred to [facility] for further evaluation of nodular Barrett's Esophagus with underlying varices - 01/21/2022: Colonoscopy: 8 polyps ranging 2-8 mm (path: SSP x1, several tubular adenomas).  Normal TI.  Repeat 3 years - 02/28/2022: Evaluated by Dr. Mont Dutton at [facility] advanced to GI clinic. - 02/28/2022: EGD/EUS at [facility]: Nondysplastic Barrett's Esophagus without discrete nodule.   Grade 2 esophageal varices, portal hypertensive gastropathy.  Gastric antral vascular ectasia with bleeding but not treated by APC.  EUS notable for esophageal varices but no other significant pathology - 05/08/2022: H/H 10.3/31.7, MCV/RDW 89/16.5.  PLT 64.  Ferritin 12.6  Previously, has had a  following GI evaluations: - 12/2017: Hospital admission for hematemesis, melena, acute blood loss anemia.  Transfused 2 unit PRBCs - 01/14/2018: EGD (Dr. Bonna Gains): LA Grade D esophagitis, moderate portal hypertensive gastropathy in the fundus with friability.  Treated with high-dose PPI.  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  Normal PV.  CBD 3 mm. - 01/29/2018: Ultrasound: Mild to moderate ascites  - 12/14/2020: Evaluated by Dr. Marian Sorrow at [place] in [facility] for anemia. Hgb 6.5 in 08/2020, but 9.1 in 11/2020.  FOBT positive but no overt bleeding.  Was started on oral iron, but does not think he had a blood transfusion or IV iron in that interim to account for the improvement.  Was scheduled for EGD/colonoscopy: - EGD (01/09/2021): Couple areas of ulcerated mucosa in distal esophagus.  Significant mucosal changes in distal esophagus that could represent Barrett's Esophagus.  1-1.5 cm mass lesion within the distal esophagus concerning for Barrett's nodular disease (path: Barrett's esophagus without dysplasia).  2 cm submucosal lesion in the gastric body, well-circumscribed with normal overlying mucosa.  Erosive gastritis in distal stomach (path: None H. pylori gastritis).  Normal duodenum.  Small hiatal hernia.  Recommended GI follow-up, repeat EGD, and referral for EUS, but these were never completed by patient. - Colonoscopy (01/09/2021): Poor prep.  Only able to advance to splenic flexure then procedure aborted    Endoscopic History: - Colonoscopy (2004): Polyps per patient. No report in EMR for review - Colonoscopy (2009): Normal per notes, but no official record in EMR for review - EGD/colonoscopy (12/2020):  As above - EGD (12/2021): As above - EGD/EUS (02/2022, [facility]): As above  HPI:     Patient is a 76 y.o. male presenting to the  Gastroenterology Clinic for follow-up.  Last seen by me on 01/16/2022, with subsequent EGD on 01/21/2022 and then referred to [facility] in August, as outlined above.  His main issue today is fatigue.  Taking oral iron daily without issue.  Has any overt GI bleeding.  GERD otherwise well controlled with Protonix.    Review of systems:     No chest pain, no SOB, no fevers, no urinary sx   Past Medical History:  Diagnosis Date   Arthritis    Diabetes ([place])    High cholesterol    Hypertension    Kidney stone    Myasthenia gravis ([place])     Patient's surgical history, family medical history, social history, medications and allergies were all reviewed in Epic    Current Outpatient Medications  Medication Sig Dispense Refill   amLODipine (NORVASC) 10 MG tablet Take 1 tablet by mouth daily.     atenolol (TENORMIN) 50 MG tablet Take 1 tablet by mouth daily.     atorvastatin (LIPITOR) 20 MG tablet Take 20 mg by mouth daily.     diltiazem (CARDIZEM CD) 120 MG 24 hr capsule Take 1 capsule (120 mg total) by mouth daily.     fluticasone (FLONASE) 50 MCG/ACT nasal spray Place 1 spray into both nostrils as needed for allergies or rhinitis.     furosemide (LASIX) 20 MG tablet Take 3 tablets (60 mg total) by mouth 2 (two) times daily. 30 tablet    insulin aspart (NOVOLOG) 100 UNIT/ML injection Inject 0-9 Units into the skin 3 (three) times daily with meals. 10 mL 11   insulin glargine (LANTUS) 100 UNIT/ML injection Inject 0.14 mLs (14 Units total) into the skin at bedtime. 10 mL 11   losartan (COZAAR) 25 MG tablet Take 25 mg by mouth daily.     metFORMIN (GLUCOPHAGE) 850 MG tablet Take 850 mg by mouth 2 (two) times daily with a meal.     oxybutynin (DITROPAN XL) 15 MG 24 hr tablet Take 15 mg by mouth daily.     pantoprazole (PROTONIX) 40 MG tablet Take 40 mg by mouth 2 (two) times daily.     potassium chloride (KLOR-CON) 20 MEQ packet Take 40 mEq by mouth daily.     traMADol (ULTRAM)  50 MG tablet Take 1 tablet (50 mg total) by mouth every 6 (six) hours as needed for moderate pain. 30 tablet 0   No current facility-administered medications for this visit.    Physical Exam:     BP 130/70   Pulse 80   Ht 5' 11"  (1.803 m)   Wt 173 lb 2 oz (78.5 kg)   BMI 24.15 kg/m   GENERAL:  Pleasant male in NAD PSYCH: : Cooperative, normal affect EENT:  conjunctiva pink, mucous membranes moist, neck supple without masses CARDIAC:  RRR, no murmur heard, no peripheral edema PULM: Normal respiratory effort, lungs CTA bilaterally, no wheezing ABDOMEN:  Nondistended, soft, nontender. No obvious masses, no hepatomegaly,  normal bowel sounds SKIN:  turgor, no lesions seen Musculoskeletal:  Normal muscle tone, normal strength NEURO: Alert and oriented x 3, no focal neurologic deficits   IMPRESSION and PLAN:    1) Gastric antral vascular ectasias 2) Normocytic Anemia with iron deficiency Recent CBC with normocytic anemia with H/H 10/31, but ferritin 12.6.  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 - We will proceed with extended serologic work-up to evaluate for concomitant liver disease - Discussed the role/utility of liver biopsy, and patient and family members do not want to proceed with that - RUQ [place] - Check AFP - Reevaluate esophageal varices times upper endoscopy as above.  If unchanged, repeat in 2 years for surveillance - Currently prescribed atenolol - Can continue Lasix  7) GERD 8) Short segment nondysplastic Barrett's  Esophagus Underwent EGD/EUS at [facility] in 02/2022.  No nodular appearance to the esophagus.  EUS portions otherwise unremarkable.  Esophageal biopsies confirmed nondysplastic Barrett's Esophagus - Continue PPI - Periodic Barrett's Esophagus surveillance can be conducted at the time of variceal surveillance studies   RTC in 3 months or sooner as needed     The indications, risks, and benefits of EGD were explained to the patient in detail. Risks include but are not limited to bleeding, perforation, adverse reaction to medications, and cardiopulmonary compromise. Sequelae include but are not limited to the possibility of surgery, hospitalization, and mortality. The patient verbalized understanding and wished to proceed. All questions answered, referred to scheduler. Further recommendations pending results of the exam.     Dominic Pea Axcel Horsch ,DO, FACG 05/28/2022, 1:42 PM

## 2022-05-30 ENCOUNTER — Encounter (HOSPITAL_COMMUNITY): Payer: Self-pay | Admitting: Gastroenterology

## 2022-05-30 LAB — HEPATITIS B SURFACE ANTIBODY,QUALITATIVE: Hep B S Ab: NONREACTIVE

## 2022-05-30 LAB — IGG: IgG (Immunoglobin G), Serum: 1440 mg/dL (ref 600–1540)

## 2022-05-30 LAB — AFP TUMOR MARKER: AFP-Tumor Marker: 1.7 ng/mL (ref <6.1)

## 2022-05-30 LAB — MITOCHONDRIAL ANTIBODIES: Mitochondrial M2 Ab, IgG: <=20 U (ref <=20.0)

## 2022-05-30 LAB — HEPATITIS B SURFACE ANTIGEN: Hepatitis B Surface Ag: NONREACTIVE

## 2022-05-30 LAB — ANTI-SMOOTH MUSCLE ANTIBODY, IGG: Actin (Smooth Muscle) Antibody (IGG): <20 U (ref <20)

## 2022-05-30 LAB — IGM: IgM, Serum: 60 mg/dL (ref 50–300)

## 2022-05-30 LAB — HEPATITIS A ANTIBODY, TOTAL: Hepatitis A AB,Total: REACTIVE — AB

## 2022-05-30 LAB — ALPHA-1-ANTITRYPSIN: A-1 Antitrypsin, Ser: 155 mg/dL (ref 83–199)

## 2022-05-30 LAB — CERULOPLASMIN: Ceruloplasmin: 31 mg/dL (ref 18–36)

## 2022-05-30 LAB — ANA: Anti Nuclear Antibody (ANA): NEGATIVE

## 2022-05-31 ENCOUNTER — Encounter (HOSPITAL_COMMUNITY): Payer: Self-pay | Admitting: Gastroenterology

## 2022-05-31 NOTE — Progress Notes (Signed)
Attempted to obtain medical history via telephone, unable to reach at this time. HIPAA compliant voicemail message left requesting return call to pre surgical testing department. 

## 2022-06-03 ENCOUNTER — Other Ambulatory Visit: Payer: Self-pay

## 2022-06-03 DIAGNOSIS — D509 Iron deficiency anemia, unspecified: Secondary | ICD-10-CM

## 2022-06-03 DIAGNOSIS — E538 Deficiency of other specified B group vitamins: Secondary | ICD-10-CM

## 2022-06-04 ENCOUNTER — Ambulatory Visit (HOSPITAL_COMMUNITY)
Admission: RE | Admit: 2022-06-04 | Discharge: 2022-06-04 | Disposition: A | Discharge status: Home / Self Care | Payer: Medicare Other | Source: Ambulatory Visit | Attending: Gastroenterology | Admitting: Gastroenterology

## 2022-06-04 DIAGNOSIS — K7460 Unspecified cirrhosis of liver: Secondary | ICD-10-CM | POA: Insufficient documentation

## 2022-06-04 DIAGNOSIS — D509 Iron deficiency anemia, unspecified: Secondary | ICD-10-CM | POA: Insufficient documentation

## 2022-06-04 DIAGNOSIS — I8500 Esophageal varices without bleeding: Secondary | ICD-10-CM | POA: Insufficient documentation

## 2022-06-05 ENCOUNTER — Encounter: Payer: Self-pay | Admitting: Gastroenterology

## 2022-06-05 ENCOUNTER — Other Ambulatory Visit: Payer: Self-pay

## 2022-06-05 DIAGNOSIS — K7460 Unspecified cirrhosis of liver: Secondary | ICD-10-CM

## 2022-06-05 DIAGNOSIS — D509 Iron deficiency anemia, unspecified: Secondary | ICD-10-CM

## 2022-06-05 DIAGNOSIS — I8500 Esophageal varices without bleeding: Secondary | ICD-10-CM

## 2022-06-09 NOTE — Anesthesia Preprocedure Evaluation (Signed)
Anesthesia Evaluation  Patient identified by MRN, date of birth, ID band Patient awake    Reviewed: Allergy & Precautions, NPO status , Patient's Chart, lab work & pertinent test results  History of Anesthesia Complications Negative for: history of anesthetic complications  Airway Mallampati: III  TM Distance: >3 FB Neck ROM: Limited    Dental no notable dental hx.    Pulmonary neg shortness of breath, neg sleep apnea, neg COPD, neg recent URI, former smoker   Pulmonary exam normal breath sounds clear to auscultation       Cardiovascular hypertension (amlodipine, atenolol, losartan), Pt. on home beta blockers (-) angina + CAD  (-) Past MI, (-) Cardiac Stents and (-) CABG + dysrhythmias Atrial Fibrillation  Rhythm:Regular Rate:Normal  TTE 01/27/2018: Impressions:   - Normal LV systolic function; mild diastolic dysfunction; mild    LVH; trace AI.     Neuro/Psych  Neuromuscular disease (myasthenia gravis)    GI/Hepatic ,GERD (Barrett's esophagus)  Medicated,,(+) Cirrhosis   Esophageal Varices      Endo/Other  diabetes, Type 2, Insulin Dependent    Renal/GU H/o nephrolithiasis     Musculoskeletal  (+) Arthritis ,    Abdominal  (+) - obese  Peds  Hematology  (+) Blood dyscrasia (iron deficiency), anemia   Anesthesia Other Findings 76 year old male with a history of diabetes, CAD, paroxysmal A-fib, hyperlipidemia, HTN, myasthenia gravis (not currently on IVIG), degenerative disc disease with chronic low back pain, initially seen in the GI clinic in 12/2021 for evaluation of symptomatic IDA.  Reproductive/Obstetrics                             Anesthesia Physical Anesthesia Plan  ASA: 3  Anesthesia Plan: MAC   Post-op Pain Management:    Induction: Intravenous  PONV Risk Score and Plan: 1  Airway Management Planned: Natural Airway and Nasal Cannula  Additional Equipment:   Intra-op  Plan:   Post-operative Plan:   Informed Consent: I have reviewed the patients History and Physical, chart, labs and discussed the procedure including the risks, benefits and alternatives for the proposed anesthesia with the patient or authorized representative who has indicated his/her understanding and acceptance.     Dental advisory given  Plan Discussed with: CRNA and Anesthesiologist  Anesthesia Plan Comments: (Discussed with patient risks of MAC including, but not limited to, minor pain or discomfort, hearing people in the room, and possible need for backup general anesthesia. Risks for general anesthesia also discussed including, but not limited to, sore throat, hoarse voice, chipped/damaged teeth, injury to vocal cords, nausea and vomiting, allergic reactions, lung infection, heart attack, stroke, and death. All questions answered. )       Anesthesia Quick Evaluation

## 2022-06-10 ENCOUNTER — Ambulatory Visit (HOSPITAL_COMMUNITY): Payer: Medicare Other | Admitting: Anesthesiology

## 2022-06-10 ENCOUNTER — Other Ambulatory Visit: Payer: Self-pay

## 2022-06-10 ENCOUNTER — Encounter (HOSPITAL_COMMUNITY): Payer: Self-pay | Admitting: Gastroenterology

## 2022-06-10 ENCOUNTER — Encounter (HOSPITAL_COMMUNITY)
Admission: RE | Disposition: A | Discharge status: Home / Self Care | Payer: Self-pay | Source: Home / Self Care | Attending: Gastroenterology

## 2022-06-10 ENCOUNTER — Ambulatory Visit (HOSPITAL_COMMUNITY)
Admission: RE | Admit: 2022-06-10 | Discharge: 2022-06-10 | Disposition: A | Discharge status: Home / Self Care | Payer: Medicare Other | Attending: Gastroenterology | Admitting: Gastroenterology

## 2022-06-10 ENCOUNTER — Ambulatory Visit (HOSPITAL_BASED_OUTPATIENT_CLINIC_OR_DEPARTMENT_OTHER): Payer: Medicare Other | Admitting: Anesthesiology

## 2022-06-10 DIAGNOSIS — Z7984 Long term (current) use of oral hypoglycemic drugs: Secondary | ICD-10-CM | POA: Diagnosis not present

## 2022-06-10 DIAGNOSIS — I480 Paroxysmal atrial fibrillation: Secondary | ICD-10-CM | POA: Insufficient documentation

## 2022-06-10 DIAGNOSIS — K766 Portal hypertension: Secondary | ICD-10-CM | POA: Insufficient documentation

## 2022-06-10 DIAGNOSIS — I10 Essential (primary) hypertension: Secondary | ICD-10-CM | POA: Insufficient documentation

## 2022-06-10 DIAGNOSIS — E119 Type 2 diabetes mellitus without complications: Secondary | ICD-10-CM | POA: Diagnosis not present

## 2022-06-10 DIAGNOSIS — I8510 Secondary esophageal varices without bleeding: Secondary | ICD-10-CM | POA: Diagnosis not present

## 2022-06-10 DIAGNOSIS — I2510 Atherosclerotic heart disease of native coronary artery without angina pectoris: Secondary | ICD-10-CM | POA: Diagnosis not present

## 2022-06-10 DIAGNOSIS — K219 Gastro-esophageal reflux disease without esophagitis: Secondary | ICD-10-CM

## 2022-06-10 DIAGNOSIS — M545 Low back pain, unspecified: Secondary | ICD-10-CM | POA: Insufficient documentation

## 2022-06-10 DIAGNOSIS — K3189 Other diseases of stomach and duodenum: Secondary | ICD-10-CM

## 2022-06-10 DIAGNOSIS — K31819 Angiodysplasia of stomach and duodenum without bleeding: Secondary | ICD-10-CM

## 2022-06-10 DIAGNOSIS — K7460 Unspecified cirrhosis of liver: Secondary | ICD-10-CM | POA: Insufficient documentation

## 2022-06-10 DIAGNOSIS — I8500 Esophageal varices without bleeding: Secondary | ICD-10-CM

## 2022-06-10 DIAGNOSIS — E785 Hyperlipidemia, unspecified: Secondary | ICD-10-CM | POA: Insufficient documentation

## 2022-06-10 DIAGNOSIS — D509 Iron deficiency anemia, unspecified: Secondary | ICD-10-CM

## 2022-06-10 DIAGNOSIS — G8929 Other chronic pain: Secondary | ICD-10-CM | POA: Insufficient documentation

## 2022-06-10 DIAGNOSIS — K7581 Nonalcoholic steatohepatitis (NASH): Secondary | ICD-10-CM | POA: Insufficient documentation

## 2022-06-10 DIAGNOSIS — I8511 Secondary esophageal varices with bleeding: Secondary | ICD-10-CM | POA: Diagnosis not present

## 2022-06-10 DIAGNOSIS — Z794 Long term (current) use of insulin: Secondary | ICD-10-CM

## 2022-06-10 DIAGNOSIS — K31811 Angiodysplasia of stomach and duodenum with bleeding: Secondary | ICD-10-CM | POA: Insufficient documentation

## 2022-06-10 DIAGNOSIS — K2100 Gastro-esophageal reflux disease with esophagitis, without bleeding: Secondary | ICD-10-CM | POA: Diagnosis not present

## 2022-06-10 DIAGNOSIS — Z79899 Other long term (current) drug therapy: Secondary | ICD-10-CM | POA: Insufficient documentation

## 2022-06-10 DIAGNOSIS — K2270 Barrett's esophagus without dysplasia: Secondary | ICD-10-CM

## 2022-06-10 HISTORY — PX: HOT HEMOSTASIS: SHX5433

## 2022-06-10 HISTORY — PX: ESOPHAGOGASTRODUODENOSCOPY (EGD) WITH PROPOFOL: SHX5813

## 2022-06-10 LAB — GLUCOSE, CAPILLARY: Glucose-Capillary: 93 mg/dL (ref 70–99)

## 2022-06-10 SURGERY — ESOPHAGOGASTRODUODENOSCOPY (EGD) WITH PROPOFOL
Anesthesia: Monitor Anesthesia Care

## 2022-06-10 MED ORDER — PROPOFOL 10 MG/ML IV BOLUS
INTRAVENOUS | Status: AC
  Filled 2022-06-10: qty 20

## 2022-06-10 MED ORDER — LACTATED RINGERS IV SOLN: INTRAVENOUS | Status: DC

## 2022-06-10 MED ORDER — PROPOFOL 500 MG/50ML IV EMUL
INTRAVENOUS | Status: DC | PRN
  Administered 2022-06-10: 150 ug/kg/min via INTRAVENOUS

## 2022-06-10 MED ORDER — PROPOFOL 10 MG/ML IV BOLUS
INTRAVENOUS | Status: DC | PRN
  Administered 2022-06-10 (×6): 20 mg via INTRAVENOUS

## 2022-06-10 MED ORDER — SODIUM CHLORIDE 0.9 % IV SOLN: INTRAVENOUS | Status: DC

## 2022-06-10 MED ORDER — LIDOCAINE 2% (20 MG/ML) 5 ML SYRINGE
INTRAMUSCULAR | Status: DC | PRN
  Administered 2022-06-10: 40 mg via INTRAVENOUS

## 2022-06-10 SURGICAL SUPPLY — 15 items

## 2022-06-10 NOTE — Discharge Instructions (Signed)
YOU HAD AN ENDOSCOPIC PROCEDURE TODAY: Refer to the procedure report and other information in the discharge instructions given to you for any specific questions about what was found during the examination. If this information does not answer your questions, please call [place] office at [phone] to clarify.   YOU SHOULD EXPECT: Some feelings of bloating in the abdomen. Passage of more gas than usual. Walking can help get rid of the air that was put into your GI tract during the procedure and reduce the bloating. If you had a lower endoscopy (such as a colonoscopy or flexible sigmoidoscopy) you may notice spotting of blood in your stool or on the toilet paper. Some abdominal soreness may be present for a day or two, also.  DIET: Your first meal following the procedure should be a light meal and then it is ok to progress to your normal diet. A half-sandwich or bowl of soup is an example of a good first meal. Heavy or fried foods are harder to digest and may make you feel nauseous or bloated. Drink plenty of fluids but you should avoid alcoholic beverages for 24 hours. If you had a esophageal dilation, please see attached instructions for diet.    ACTIVITY: Your care partner should take you home directly after the procedure. You should plan to take it easy, moving slowly for the rest of the day. You can resume normal activity the day after the procedure however YOU SHOULD NOT DRIVE, use power tools, machinery or perform tasks that involve climbing or major physical exertion for 24 hours (because of the sedation medicines used during the test).   SYMPTOMS TO REPORT IMMEDIATELY: A gastroenterologist can be reached at any hour. Please call [phone]  for any of the following symptoms:   Following upper endoscopy (EGD, EUS, ERCP, esophageal dilation) Vomiting of blood or coffee ground material  New, significant abdominal pain  New, significant chest pain or pain under the shoulder blades  Painful or  persistently difficult swallowing  New shortness of breath  Black, tarry-looking or red, bloody stools  FOLLOW UP:  If any biopsies were taken you will be contacted by phone or by letter within the next 1-3 weeks. Call [phone]  if you have not heard about the biopsies in 3 weeks.  Please also call with any specific questions about appointments or follow up tests.  

## 2022-06-10 NOTE — Anesthesia Postprocedure Evaluation (Signed)
Anesthesia Post Note  Patient: Bob Warren  Procedure(s) Performed: ESOPHAGOGASTRODUODENOSCOPY (EGD) WITH PROPOFOL HOT HEMOSTASIS (ARGON PLASMA COAGULATION/BICAP)     Patient location during evaluation: PACU Anesthesia Type: MAC Level of consciousness: awake Pain management: pain level controlled Vital Signs Assessment: post-procedure vital signs reviewed and stable Respiratory status: spontaneous breathing, nonlabored ventilation and respiratory function stable Cardiovascular status: stable and blood pressure returned to baseline Postop Assessment: no apparent nausea or vomiting Anesthetic complications: no   No notable events documented.  Last Vitals:  Vitals:   06/10/22 0930 06/10/22 0935  BP: 129/81 (!) 138/52  Pulse: 67 (!) 54  Resp: 17 17  Temp:    SpO2: 99% 96%    Last Pain:  Vitals:   06/10/22 0935  TempSrc:   PainSc: 0-No pain                 Nilda Simmer

## 2022-06-10 NOTE — Transfer of Care (Signed)
Immediate Anesthesia Transfer of Care Note  Patient: Bob Warren  Procedure(s) Performed: ESOPHAGOGASTRODUODENOSCOPY (EGD) WITH PROPOFOL HOT HEMOSTASIS (ARGON PLASMA COAGULATION/BICAP)  Patient Location: Endoscopy Unit  Anesthesia Type:MAC  Level of Consciousness: drowsy  Airway & Oxygen Therapy: Patient Spontanous Breathing  Post-op Assessment: Report given to RN and Post -op Vital signs reviewed and stable  Post vital signs: Reviewed and stable  Last Vitals:  Vitals Value Taken Time  BP    Temp    Pulse    Resp    SpO2      Last Pain:  Vitals:   06/10/22 0736  TempSrc: Oral  PainSc: 0-No pain      Patients Stated Pain Goal: 3 (80/22/33 6122)  Complications: No notable events documented.

## 2022-06-10 NOTE — Op Note (Signed)
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 Most recent EGD/EUS in                            02/2022 with nondysplastic Barrett's Esophagus,                            grade 2 esophageal varices, PHG, and GAVE with                            bleeding. Providers:                Bob Heck, MD, Bob Carrel, RN, Bob Warren, Technician, Bob Warren, Technician Referring MD:              Medicines:                Monitored Anesthesia Care Complications:            No immediate complications. Estimated Blood Loss:     Estimated blood loss was minimal. Procedure:                Pre-Anesthesia Assessment:                           - Prior to the procedure, a History and Physical                            was performed, and patient medications and                            allergies were reviewed. The patient's tolerance of                            previous anesthesia was also reviewed. The  risks                            and benefits of the procedure and the sedation                            options and risks were discussed with the patient.                            All questions were answered, and informed consent                            was obtained. Prior Anticoagulants: The patient has                            taken  no anticoagulant or antiplatelet agents. ASA                            Grade Assessment: III - A patient with severe                            systemic disease. After reviewing the risks and                            benefits, the patient was deemed in satisfactory                            condition to undergo the procedure.                           After obtaining informed consent, the endoscope was                            passed under direct vision. Throughout the                            procedure, the patient's blood pressure, pulse, and                            oxygen saturations were monitored continuously. The                            [id number]-H190 ([id number] Olympus endoscope was introduced                            through the mouth, and advanced to the second part                            of duodenum. The upper GI endoscopy was                            accomplished without difficulty. The patient  tolerated the procedure well. Scope In: Scope Out: Findings:      Grade II varices were found in the lower third of the esophagus. They       were 5 mm in largest diameter.      There were esophageal mucosal changes secondary to established       short-segment Barrett's disease present in the lower third of the       esophagus. The maximum longitudinal extent of these mucosal changes was       2 cm in length. As this was recently biopsied, additional biopsies not       performed today.      Moderate portal hypertensive gastropathy was found in the entire       examined stomach. There were 2 areas of active oozing in the proximal       stomach. Coagulation for hemostasis using argon plasma at 2       liters/minute and 20 watts was successful.      Moderate gastric antral vascular ectasia with bleeding was present in       the gastric antrum. There were a few areas of active oozing along with       mild mucosal friability with contact oozing.  Coagulation for hemostasis       using argon plasma at 2 liters/minute and 20 watts was successful.      The examined duodenum was normal. Impression:               - Grade II esophageal varices.                           - Esophageal mucosal changes secondary to                            established short-segment Barrett's disease.                           - Portal hypertensive gastropathy. Treated with                            argon plasma coagulation (APC).                           - Gastric antral vascular ectasia with bleeding.                            Treated with argon plasma coagulation (APC).                           - Normal examined duodenum.                           - No specimens collected. Moderate Sedation:      Not Applicable - Patient had care per Anesthesia. Recommendation:           - Patient has a contact number available for                            emergencies. The signs and symptoms of potential  delayed complications were discussed with the                            patient. Return to normal activities tomorrow.                            Written discharge instructions were provided to the                            patient.                           - Resume previous diet.                           - Continue present medications.                           - Repeat upper endoscopy PRN for retreatment.                            Otherwise, repeat EGD in 2 years for continued                            varices and Barrett's surveillance.                           - Return to GI clinic in 3 months, or sooner as                            needed.                           - Repeat CBC and iron panel in 3 months.                           - Based on endoscopic appearance, high risk for                            continued/recurrent oozing in the stomach in the                            future, which will require close monitoring  of CBC                            and iron panel with either IV iron and/or RBC                            transfusions as necessary. Procedure Code(s):        --- Professional ---                           [phone], Esophagogastroduodenoscopy, flexible,                            transoral; with  control of bleeding, any method Diagnosis Code(s):        --- Professional ---                           I85.00, Esophageal varices without bleeding                           K22.70, Barrett's esophagus without dysplasia                           K76.6, Portal hypertension                           K31.89, Other diseases of stomach and duodenum                           K31.811, Angiodysplasia of stomach and duodenum                            with bleeding                           D50.9, Iron deficiency anemia, unspecified CPT copyright 2022 American Medical Association. All rights reserved. The codes documented in this report are preliminary and upon coder review may  be revised to meet current compliance requirements. Bob Heck, MD 06/10/2022 9:19:43 AM Number of Addenda: 0

## 2022-06-10 NOTE — Anesthesia Procedure Notes (Signed)
Procedure Name: MAC Date/Time: 06/10/2022 8:46 AM  Performed by: Niel Hummer, CRNAPre-anesthesia Checklist: Patient identified, Emergency Drugs available, Suction available and Patient being monitored Oxygen Delivery Method: Simple face mask

## 2022-06-10 NOTE — Interval H&P Note (Signed)
History and Physical Interval Note:  06/10/2022 7:42 AM  Bob Warren  has presented today for surgery, with the diagnosis of Iron def. Anemia, Cirrhosis, Esophageal Varices.  The various methods of treatment have been discussed with the patient and family. After consideration of risks, benefits and other options for treatment, the patient has consented to  Procedure(s): ESOPHAGOGASTRODUODENOSCOPY (EGD) WITH PROPOFOL (N/A) as a surgical intervention.  The patient's history has been reviewed, patient examined, no change in status, stable for surgery.  I have reviewed the patient's chart and labs.  Questions were answered to the patient's satisfaction.     Dominic Pea Byrne Capek

## 2022-06-14 ENCOUNTER — Encounter (HOSPITAL_COMMUNITY): Payer: Self-pay | Admitting: Gastroenterology

## 2022-09-05 ENCOUNTER — Telehealth: Payer: Self-pay

## 2022-09-05 NOTE — Telephone Encounter (Signed)
-----   Message from Marice Potter, RN sent at 06/03/2022 10:37 AM EST ----- Pt needs Repeat iron panel, B12, and CBC. Orders in epic.

## 2022-09-05 NOTE — Telephone Encounter (Signed)
Spoke with pt and let pt know about repeat labs. Pt verbalized understanding and had no other concerns at end of call.

## 2023-01-14 ENCOUNTER — Ambulatory Visit: Payer: Medicare Other | Admitting: Gastroenterology

## 2023-01-14 NOTE — Progress Notes (Unsigned)
01/15/2023 Bob Warren [id number] 01-Jul-1946  Referring provider: Lindwood Qua, MD Primary GI doctor: Dr. Barron Alvine  ASSESSMENT AND PLAN:   Symptomatic IDA with history of cirrhosis, GAVE, portal HTN Last EGD 05/2022 s/p APC HGB 07/2022 10.3, 12/16/2022 Hgb 7.6, 12/09/2022 + FOBT 01/07/2023 Hgb 7.3 No overt GI bleeding, patient has been on iron once daily Vitals stable however patient has weakness, fatigue and shortness of breath and appears pale Most likely this represents GAVE versus portal hypertension, less likely esophageal varices with no overt GI bleeding.  Patient needs urgent endoscopy in the hospital with APC, possible IV iron as he is not responding to oral iron, potential blood product and evaluation. Discussed with stepdaughter and patient I think the best option at this time would be to go to the ER to expedite EGD and evaluation. Reached out to inpatient team at [facility] to secure endoscopy tomorrow with APC Patient driven in private vehicle to the ER for evaluation.  Hepatic cirrhosis secondary to Elita Boone, negative serological workup 2019, with complications of esophageal varices, thrombocytopenia, portal hypertension Appears to be compensated at this but needs INR, CBC, c-Met in the hospital to calculate current MELD May want to consider switching patient to nonselective beta-blocker such as Coreg or propranolol rather than atenolol. Patient is on Lasix and potassium supplement, still some swelling bilateral legs, may benefit from low-dose addition of spironolactone if patient's kidney function can tolerate it. Last AFP 05/28/2022, last right upper quadrant ultrasound 06/04/2022, patient's due for both [facility] check with AFP and ultrasound  Barrett's esophagus without dysplasia Continue Protonix 40 mg twice daily Underwent EGD/EUS at [facility] in 02/2022. No nodular appearance to the esophagus. EUS portions otherwise unremarkable. Esophageal biopsies confirmed nondysplastic  Barrett's Esophagus   Thrombocytopenia (HCC) Monitor closely, secondary to cirrhosis. Ideally want platelets above 50 prior to endoscopic evaluation, recheck today  B12 deficiency Continue B12 supplements  Drug-induced pancytopenia (HCC)  Myasthenia gravis [facility])   Patient Care Team: Lindwood Qua, MD as PCP - General (Internal Medicine) Hospital, Georges Lynch as Referring Physician (General Practice)  HISTORY OF PRESENT ILLNESS: 77 y.o. 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 Frankey Shown note or below for complete records. 01/21/2022 EGD and colonoscopy for IDA FOBT positive grade 1 esophageal varices, Barrett's esophagus no dysplasia, moderate gastritis Path benign, hyperplastic polyp, duodenitis benign.  Colonoscopy 8 polyps 2 to 8 mm normal TI recall 3 years Being followed by Dr. Shana Chute at [facility] advanced to GI clinic.  02/28/2022: EGD/EUS at [facility]: Nondysplastic Barrett's Esophagus without discrete nodule. Grade 2 esophageal varices, portal hypertensive gastropathy. Gastric antral vascular ectasia with bleeding but not treated by APC. EUS notable for esophageal varices but no other significant pathology  06/10/2022 EGD at [facility] long with Dr. Barron Alvine grade 2 esophageal varices, esophageal mucosal changes secondary to short segment Barrett's, portal hypertensive gastropathy treated with APC, GAVE with bleeding treated with APC, normal duodenum no specimens collected.   Recall EGD as needed for treatment otherwise recall 2 years. 12/16/2022 patient followed with [facility] health care primary care with worsening weakness, positive  guaiac stools  08/27/2022 HGB 10.3 platelets 68 12/16/2022 Hgb 7.6, MCV 81, WBC 4.4, platelets 77 12/09/2022 + FOBT 01/07/2023 Hgb  7.3, MCV 77.8, WBC 3.0, platelets 72  He has had fatigue in the last 3 months, weakness. He has SOB but chest pain. No dizziness, or walks. Normally uses cane for walking but he is in wheelchair. He also has had right lateral/back hip pain with no radiation down his leg, worse after walking at [place] this past Wednesday.  He denies melena or hematochezia.  No loose stools or change in bowel habits.  He is not on blood thinners, no ASA.  He has been on iron for last 8-9 months, he is on once a day OTC iron.  He is on atenolol, not on propanolol or coreg.  No confusion recently.  His legs stay swollen all the time, denies AB swelling. He is on lasix 20 mg daily with klor con 20 meq daily.  He is on protonix 40 mg twice a day, started on carafate 4 x a day in May.   He denies blood thinner use.  He denies NSAID use.  He denies ETOH use.   He denies tobacco use.  He denies drug use.    He  reports that he has quit smoking. He has never used smokeless tobacco. He reports that he does not drink alcohol and does not use drugs.   - 12/14/2021: Evaluated by [facility] for progressive DOE x1 month.  No melena, hematochezia.  Labs n/f H/H 5.1/18 with MCV 66.7.  PLT 74.  Ferritin 3.8, iron 269, TIBC 432, sat 62%.  Denies NSAIDs due to history of gastritis in 2019 - 12/20/2021: ER evaluation for IDA.  Hemodynamically stable. Hgb 5.1, MCV 67.  Normal CMP, INR 1.14.  FOBT negative.  Transfused 2 unit PRBCs with posttransfusion hemoglobin 7.2 and discharged home. - 12/21/2021: Follow-up with PCM, Dr. Mikey Bussing.  Was taking oral iron - 01/04/2022: H/H 8/26.6, MCV/RDW 75.8/26, PLT 102.  FOBT positive - 01/16/2022: Initial appointment in GI clinic.  No overt bleeding. - 01/21/2022: EGD: Grade 1 esophageal varices, C3-M6 nondysplastic Barrett's Esophagus with subtle 3-4 mm nodule in the lower  esophagus.  Moderate gastritis with edema, erythema, friability (path: Benign).  5 mm antral polyp removed with forceps (path: Hyperplastic polyp).  Mild duodenitis (path: Benign).  Was referred to [facility] for further evaluation of nodular Barrett's Esophagus with underlying varices - 01/21/2022: Colonoscopy: 8 polyps ranging 2-8 mm (path: SSP x1, several tubular adenomas).  Normal TI.  Repeat 3 years - 02/28/2022: Evaluated by Dr. Shana Chute at [facility] advanced to GI clinic. - 02/28/2022: EGD/EUS at [facility]: Nondysplastic Barrett's Esophagus without discrete nodule.   Grade 2 esophageal varices, portal hypertensive gastropathy.  Gastric antral vascular ectasia with bleeding but not treated by APC.  EUS notable for esophageal varices but no other significant pathology - 05/08/2022: H/H 10.3/31.7, MCV/RDW 89/16.5.  PLT 64.  Ferritin 12.6   Previously, has had a following GI evaluations: - 12/2017: Hospital admission for hematemesis, melena, acute blood loss anemia.  Transfused 2 unit PRBCs - 01/14/2018: EGD (Dr. Maximino Greenland): LA Grade D esophagitis, moderate portal hypertensive gastropathy in the fundus with friability.  Treated with high-dose PPI.  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  Normal PV.  CBD 3 mm. - 01/29/2018: Ultrasound: Mild to moderate ascites -05/28/2022 extensive serological workup for cirrhosis negative IgG, normal AFP, unremarkable alpha-1 antitrypsin, ANA, AMA, ASMA, ceruloplasmin, hepatitis B, patient had immunity to hepatitis A but not hepatitis B.    - 12/14/2020: Evaluated  by Dr. Judith Part at [facility] health GI in [facility] for anemia. Hgb 6.5 in 08/2020, but 9.1 in 11/2020.  FOBT positive but no overt bleeding.  Was started on oral iron, but does not think he had a blood transfusion or IV iron in that interim to account for the improvement.  Was scheduled for  EGD/colonoscopy: - EGD (01/09/2021): Couple areas of ulcerated mucosa in distal esophagus.  Significant mucosal changes in distal esophagus that could represent Barrett's Esophagus.  1-1.5 cm mass lesion within the distal esophagus concerning for Barrett's nodular disease (path: Barrett's esophagus without dysplasia).  2 cm submucosal lesion in the gastric body, well-circumscribed with normal overlying mucosa.  Erosive gastritis in distal stomach (path: None H. pylori gastritis).  Normal duodenum.  Small hiatal hernia.  Recommended GI follow-up, repeat EGD, and referral for EUS, but these were never completed by patient. - Colonoscopy (01/09/2021): Poor prep.  Only able to advance to splenic flexure then procedure aborted     RELEVANT LABS AND IMAGING: CBC    Component Value Date/Time   WBC 4.3 01/16/2022 1236   RBC 3.34 (L) 01/16/2022 1236   HGB 8.1 Repeated and verified X2. (L) 01/16/2022 1236   HGB 12.1 (L) 07/03/2017 1253   HCT 26.9 (L) 01/16/2022 1236   HCT 35.7 (L) 07/03/2017 1253   PLT 76.0 (L) 01/16/2022 1236   PLT 167 07/03/2017 1253   MCV 80.6 01/16/2022 1236   MCV 109 (H) 07/03/2017 1253   MCH 35.8 (H) 02/03/2018 0437   MCHC 30.0 01/16/2022 1236   RDW 26.4 (H) 01/16/2022 1236   RDW 23.1 (H) 07/03/2017 1253   LYMPHSABS 0.6 (L) 01/28/2018 0511   LYMPHSABS 0.9 07/03/2017 1253   MONOABS 0.2 01/28/2018 0511   EOSABS 0.1 01/28/2018 0511   EOSABS 0.1 07/03/2017 1253   BASOSABS 0.1 01/28/2018 0511   BASOSABS 0.1 07/03/2017 1253   Recent Labs    01/16/22 1236  HGB 8.1 Repeated and verified X2.*    CMP     Component Value Date/Time   NA 141 02/03/2018 0437   NA 140 07/03/2017 1253   K 3.1 (L) 02/03/2018 0437   CL 107 02/03/2018 0437   CO2 25 02/03/2018 0437   GLUCOSE 185 (H) 02/03/2018 0437   BUN 26 (H) 02/03/2018 0437   BUN 22 07/03/2017 1253   CREATININE 1.75 (H) 02/03/2018 0437   CALCIUM 8.1 (L) 02/03/2018 0437   PROT 6.1 (L) 02/03/2018 0437   PROT 6.2 07/03/2017  1253   ALBUMIN 1.9 (L) 02/03/2018 0437   ALBUMIN 2.8 (L) 07/03/2017 1253   AST 35 02/03/2018 0437   ALT 25 02/03/2018 0437   ALKPHOS 85 02/03/2018 0437   BILITOT 1.8 (H) 02/03/2018 0437   BILITOT 5.9 (H) 07/03/2017 1253   GFRNONAA 37 (L) 02/03/2018 0437   GFRAA 43 (L) 02/03/2018 0437      Latest Ref Rng & Units 02/03/2018    4:37 AM 02/01/2018    5:29 AM 01/31/2018    6:08 AM  Hepatic Function  Total Protein 6.5 - 8.1 g/dL 6.1  6.0  5.7   Albumin 3.5 - 5.0 g/dL 1.9  1.9  2.1   AST 15 - 41 U/L 35  51  41   ALT 0 - 44 U/L 25  23  27    Alk Phosphatase 38 - 126 U/L 85  77  71   Total Bilirubin 0.3 - 1.2 mg/dL 1.8  2.0  1.9       Latest  Ref Rng & Units 05/28/2022    2:46 PM  Hepatitis C  AFP <6.1 ng/mL 1.7     Current Medications:   Current Outpatient Medications (Endocrine & Metabolic):    insulin aspart (NOVOLOG) 100 UNIT/ML injection, Inject 0-9 Units into the skin 3 (three) times daily with meals.   insulin glargine (LANTUS) 100 UNIT/ML injection, Inject 0.14 mLs (14 Units total) into the skin at bedtime.   metFORMIN (GLUCOPHAGE) 850 MG tablet, Take 850 mg by mouth 2 (two) times daily with a meal.  Current Outpatient Medications (Cardiovascular):    amLODipine (NORVASC) 10 MG tablet, Take 10 mg by mouth daily.   atenolol (TENORMIN) 50 MG tablet, Take 50 mg by mouth daily.   atorvastatin (LIPITOR) 20 MG tablet, Take 20 mg by mouth daily.   diltiazem (CARDIZEM CD) 120 MG 24 hr capsule, Take 1 capsule (120 mg total) by mouth daily.   furosemide (LASIX) 20 MG tablet, Take 3 tablets (60 mg total) by mouth 2 (two) times daily.   losartan (COZAAR) 25 MG tablet, Take 25 mg by mouth daily.  Current Outpatient Medications (Respiratory):    fluticasone (FLONASE) 50 MCG/ACT nasal spray, Place 1 spray into both nostrils as needed for allergies or rhinitis.  Current Outpatient Medications (Analgesics):    traMADol (ULTRAM) 50 MG tablet, Take 1 tablet (50 mg total) by mouth every 6 (six)  hours as needed for moderate pain.  Current Outpatient Medications (Hematological):    cyanocobalamin (VITAMIN B12) 1000 MCG tablet, Take 1,000 mcg by mouth daily.  Current Outpatient Medications (Other):    oxybutynin (DITROPAN XL) 15 MG 24 hr tablet, Take 15 mg by mouth daily.   pantoprazole (PROTONIX) 40 MG tablet, Take 40 mg by mouth 2 (two) times daily.   potassium chloride SA (KLOR-CON M) 20 MEQ tablet, Take 40 mEq by mouth daily.   sucralfate (CARAFATE) 1 g tablet, Take 1 g by mouth 4 (four) times daily -  with meals and at bedtime.  Medical History:  Past Medical History:  Diagnosis Date   Arthritis    Diabetes (HCC)    High cholesterol    Hypertension    Kidney stone    Myasthenia gravis (HCC)    Allergies:  Allergies  Allergen Reactions   Imuran [Azathioprine] Other (See Comments)    Abnormal LIver functional    Lisinopril Cough     Surgical History:  He  has a past surgical history that includes Skin cancer excision (Right, 2016); Colonoscopy (01/09/2021); Esophagogastroduodenoscopy (N/A, 01/14/2018); Esophagogastroduodenoscopy (01/09/2021); Esophagogastroduodenoscopy (egd) with propofol (N/A, 06/10/2022); and Hot hemostasis (N/A, 06/10/2022). Family History:  His family history includes Breast cancer in his mother; Diabetes in his maternal grandfather; Heart disease in his maternal grandfather; Other in his father.  REVIEW OF SYSTEMS  : All other systems reviewed and negative except where noted in the History of Present Illness.  PHYSICAL EXAM: BP (!) 150/70   Pulse 72   Ht 5\' 11"  (1.803 m)   Wt 182 lb (82.6 kg)   BMI 25.38 kg/m  General : Pale, chronically ill-appearing elderly white male in wheelchair  Head:  Normocephalic and atraumatic. Eyes :  sclerae anicteric,conjunctive pale  Heart:  regular rate and rhythm, systolic murmur Pulm:  Clear anteriorly; no wheezing, no increased work of breathing Abdomen:   Soft, Obese AB, Normal bowel sounds.  no   tenderness . , . no  fluid wave, no  shifting dullness.  Extremities:   With edema. Msk:  Symmetrical without  gross deformities. Peripheral pulses intact. Patient unable to walk due to dizziness/weakness, in a wheelchair at this time. Neurologic: Alert and  oriented x4;  grossly normal neurologically. without asterixis or clonus.  Skin:   without jaundice.  Psychiatric:  Demonstrates good judgement and reason without abnormal affect or behaviors.    Doree Albee, PA-C 12:02 PM

## 2023-01-15 ENCOUNTER — Inpatient Hospital Stay (HOSPITAL_COMMUNITY)
Admission: EM | Admit: 2023-01-15 | Discharge: 2023-01-17 | DRG: 432 | Disposition: A | Discharge status: Home / Self Care | Payer: Medicare HMO | Attending: Family Medicine | Admitting: Family Medicine

## 2023-01-15 ENCOUNTER — Other Ambulatory Visit: Payer: Self-pay

## 2023-01-15 ENCOUNTER — Encounter: Payer: Self-pay | Admitting: Physician Assistant

## 2023-01-15 ENCOUNTER — Ambulatory Visit: Payer: Medicare HMO | Admitting: Physician Assistant

## 2023-01-15 VITALS — BP 150/70 | HR 72 | Ht 71.0 in | Wt 182.0 lb

## 2023-01-15 DIAGNOSIS — Z66 Do not resuscitate: Secondary | ICD-10-CM | POA: Diagnosis present

## 2023-01-15 DIAGNOSIS — D62 Acute posthemorrhagic anemia: Secondary | ICD-10-CM | POA: Diagnosis present

## 2023-01-15 DIAGNOSIS — Z833 Family history of diabetes mellitus: Secondary | ICD-10-CM

## 2023-01-15 DIAGNOSIS — G8929 Other chronic pain: Secondary | ICD-10-CM | POA: Diagnosis present

## 2023-01-15 DIAGNOSIS — K7581 Nonalcoholic steatohepatitis (NASH): Secondary | ICD-10-CM | POA: Diagnosis present

## 2023-01-15 DIAGNOSIS — I8500 Esophageal varices without bleeding: Secondary | ICD-10-CM | POA: Diagnosis not present

## 2023-01-15 DIAGNOSIS — G7000 Myasthenia gravis without (acute) exacerbation: Secondary | ICD-10-CM | POA: Diagnosis present

## 2023-01-15 DIAGNOSIS — I10 Essential (primary) hypertension: Secondary | ICD-10-CM | POA: Diagnosis present

## 2023-01-15 DIAGNOSIS — R5383 Other fatigue: Secondary | ICD-10-CM | POA: Diagnosis present

## 2023-01-15 DIAGNOSIS — Z7984 Long term (current) use of oral hypoglycemic drugs: Secondary | ICD-10-CM

## 2023-01-15 DIAGNOSIS — D509 Iron deficiency anemia, unspecified: Secondary | ICD-10-CM | POA: Diagnosis present

## 2023-01-15 DIAGNOSIS — D696 Thrombocytopenia, unspecified: Secondary | ICD-10-CM | POA: Diagnosis present

## 2023-01-15 DIAGNOSIS — M1611 Unilateral primary osteoarthritis, right hip: Secondary | ICD-10-CM | POA: Diagnosis present

## 2023-01-15 DIAGNOSIS — Z79899 Other long term (current) drug therapy: Secondary | ICD-10-CM

## 2023-01-15 DIAGNOSIS — Z8249 Family history of ischemic heart disease and other diseases of the circulatory system: Secondary | ICD-10-CM

## 2023-01-15 DIAGNOSIS — K922 Gastrointestinal hemorrhage, unspecified: Secondary | ICD-10-CM | POA: Diagnosis not present

## 2023-01-15 DIAGNOSIS — R195 Other fecal abnormalities: Secondary | ICD-10-CM

## 2023-01-15 DIAGNOSIS — Z85828 Personal history of other malignant neoplasm of skin: Secondary | ICD-10-CM

## 2023-01-15 DIAGNOSIS — K766 Portal hypertension: Secondary | ICD-10-CM | POA: Diagnosis present

## 2023-01-15 DIAGNOSIS — I480 Paroxysmal atrial fibrillation: Secondary | ICD-10-CM | POA: Diagnosis present

## 2023-01-15 DIAGNOSIS — I8511 Secondary esophageal varices with bleeding: Secondary | ICD-10-CM | POA: Diagnosis present

## 2023-01-15 DIAGNOSIS — I2510 Atherosclerotic heart disease of native coronary artery without angina pectoris: Secondary | ICD-10-CM | POA: Diagnosis present

## 2023-01-15 DIAGNOSIS — K3189 Other diseases of stomach and duodenum: Secondary | ICD-10-CM

## 2023-01-15 DIAGNOSIS — Z87442 Personal history of urinary calculi: Secondary | ICD-10-CM

## 2023-01-15 DIAGNOSIS — D61818 Other pancytopenia: Secondary | ICD-10-CM

## 2023-01-15 DIAGNOSIS — E1122 Type 2 diabetes mellitus with diabetic chronic kidney disease: Secondary | ICD-10-CM | POA: Diagnosis present

## 2023-01-15 DIAGNOSIS — K2270 Barrett's esophagus without dysplasia: Secondary | ICD-10-CM

## 2023-01-15 DIAGNOSIS — Z803 Family history of malignant neoplasm of breast: Secondary | ICD-10-CM

## 2023-01-15 DIAGNOSIS — E538 Deficiency of other specified B group vitamins: Secondary | ICD-10-CM

## 2023-01-15 DIAGNOSIS — K31811 Angiodysplasia of stomach and duodenum with bleeding: Secondary | ICD-10-CM | POA: Diagnosis present

## 2023-01-15 DIAGNOSIS — K7460 Unspecified cirrhosis of liver: Secondary | ICD-10-CM

## 2023-01-15 DIAGNOSIS — K31819 Angiodysplasia of stomach and duodenum without bleeding: Secondary | ICD-10-CM

## 2023-01-15 DIAGNOSIS — Z888 Allergy status to other drugs, medicaments and biological substances status: Secondary | ICD-10-CM

## 2023-01-15 DIAGNOSIS — E7800 Pure hypercholesterolemia, unspecified: Secondary | ICD-10-CM | POA: Diagnosis present

## 2023-01-15 DIAGNOSIS — M545 Low back pain, unspecified: Secondary | ICD-10-CM | POA: Diagnosis present

## 2023-01-15 DIAGNOSIS — Z87891 Personal history of nicotine dependence: Secondary | ICD-10-CM

## 2023-01-15 DIAGNOSIS — Z794 Long term (current) use of insulin: Secondary | ICD-10-CM

## 2023-01-15 DIAGNOSIS — D61811 Other drug-induced pancytopenia: Secondary | ICD-10-CM

## 2023-01-15 DIAGNOSIS — D649 Anemia, unspecified: Principal | ICD-10-CM

## 2023-01-15 LAB — CBC WITH DIFFERENTIAL/PLATELET
Abs Immature Granulocytes: 0.01 10*3/uL (ref 0.00–0.07)
Basophils Absolute: 0 10*3/uL (ref 0.0–0.1)
Basophils Relative: 1 %
Eosinophils Absolute: 0.1 10*3/uL (ref 0.0–0.5)
Eosinophils Relative: 3 %
HCT: 22.9 % — ABNORMAL LOW (ref 39.0–52.0)
Hemoglobin: 6.4 g/dL — CL (ref 13.0–17.0)
Immature Granulocytes: 0 %
Lymphocytes Relative: 22 %
Lymphs Abs: 0.6 10*3/uL — ABNORMAL LOW (ref 0.7–4.0)
MCH: 23 pg — ABNORMAL LOW (ref 26.0–34.0)
MCHC: 27.9 g/dL — ABNORMAL LOW (ref 30.0–36.0)
MCV: 82.4 fL (ref 80.0–100.0)
Monocytes Absolute: 0.2 10*3/uL (ref 0.1–1.0)
Monocytes Relative: 8 %
Neutro Abs: 1.8 10*3/uL (ref 1.7–7.7)
Neutrophils Relative %: 66 %
Platelets: 75 10*3/uL — ABNORMAL LOW (ref 150–400)
RBC: 2.78 MIL/uL — ABNORMAL LOW (ref 4.22–5.81)
RDW: 14.9 % (ref 11.5–15.5)
WBC: 2.8 10*3/uL — ABNORMAL LOW (ref 4.0–10.5)
nRBC: 0 % (ref 0.0–0.2)

## 2023-01-15 LAB — COMPREHENSIVE METABOLIC PANEL
ALT: 21 U/L (ref 0–44)
AST: 20 U/L (ref 15–41)
Albumin: 2.9 g/dL — ABNORMAL LOW (ref 3.5–5.0)
Alkaline Phosphatase: 70 U/L (ref 38–126)
Anion gap: 4 — ABNORMAL LOW (ref 5–15)
BUN: 19 mg/dL (ref 8–23)
CO2: 20 mmol/L — ABNORMAL LOW (ref 22–32)
Calcium: 8.2 mg/dL — ABNORMAL LOW (ref 8.9–10.3)
Chloride: 110 mmol/L (ref 98–111)
Creatinine, Ser: 1.36 mg/dL — ABNORMAL HIGH (ref 0.61–1.24)
GFR, Estimated: 54 mL/min — ABNORMAL LOW (ref >60)
Glucose, Bld: 272 mg/dL — ABNORMAL HIGH (ref 70–99)
Potassium: 4.6 mmol/L (ref 3.5–5.1)
Sodium: 134 mmol/L — ABNORMAL LOW (ref 135–145)
Total Bilirubin: 0.3 mg/dL (ref 0.3–1.2)
Total Protein: 6.6 g/dL (ref 6.5–8.1)

## 2023-01-15 LAB — HEMOGLOBIN AND HEMATOCRIT, BLOOD
HCT: 29.3 % — ABNORMAL LOW (ref 39.0–52.0)
Hemoglobin: 8.9 g/dL — ABNORMAL LOW (ref 13.0–17.0)

## 2023-01-15 LAB — TYPE AND SCREEN: Antibody Screen: NEGATIVE

## 2023-01-15 LAB — BPAM RBC
Blood Product Expiration Date: 202407102359
ISSUE DATE / TIME: 202406191439
Unit Type and Rh: 6200

## 2023-01-15 LAB — PROTIME-INR
INR: 1.1 (ref 0.8–1.2)
Prothrombin Time: 14.6 seconds (ref 11.4–15.2)

## 2023-01-15 LAB — HEMOGLOBIN A1C
Hgb A1c MFr Bld: 6.8 % — ABNORMAL HIGH (ref 4.8–5.6)
Mean Plasma Glucose: 148.46 mg/dL

## 2023-01-15 LAB — GLUCOSE, CAPILLARY: Glucose-Capillary: 153 mg/dL — ABNORMAL HIGH (ref 70–99)

## 2023-01-15 LAB — CBG MONITORING, ED: Glucose-Capillary: 232 mg/dL — ABNORMAL HIGH (ref 70–99)

## 2023-01-15 LAB — PREPARE RBC (CROSSMATCH)

## 2023-01-15 MED ORDER — INSULIN ASPART 100 UNIT/ML IJ SOLN: 0.0000 [IU] | Freq: Every day | INTRAMUSCULAR | Status: DC

## 2023-01-15 MED ORDER — TRAZODONE HCL 50 MG PO TABS: 25.0000 mg | ORAL_TABLET | Freq: Every evening | ORAL | Status: DC | PRN

## 2023-01-15 MED ORDER — PANTOPRAZOLE SODIUM 40 MG IV SOLR: 40.0000 mg | Freq: Two times a day (BID) | INTRAVENOUS | Status: DC

## 2023-01-15 MED ORDER — ONDANSETRON HCL 4 MG/2ML IJ SOLN: 4.0000 mg | Freq: Four times a day (QID) | INTRAMUSCULAR | Status: DC | PRN

## 2023-01-15 MED ORDER — ALBUTEROL SULFATE (2.5 MG/3ML) 0.083% IN NEBU: 2.5000 mg | INHALATION_SOLUTION | RESPIRATORY_TRACT | Status: DC | PRN

## 2023-01-15 MED ORDER — TRAMADOL HCL 50 MG PO TABS
50.0000 mg | ORAL_TABLET | Freq: Three times a day (TID) | ORAL | Status: DC | PRN
  Administered 2023-01-16 – 2023-01-17 (×3): 50 mg via ORAL
  Filled 2023-01-15 (×4): qty 1

## 2023-01-15 MED ORDER — ONDANSETRON HCL 4 MG PO TABS: 4.0000 mg | ORAL_TABLET | Freq: Four times a day (QID) | ORAL | Status: DC | PRN

## 2023-01-15 MED ORDER — SODIUM CHLORIDE 0.9% IV SOLUTION: Freq: Once | INTRAVENOUS | Status: AC

## 2023-01-15 MED ORDER — INSULIN ASPART 100 UNIT/ML IJ SOLN
0.0000 [IU] | Freq: Three times a day (TID) | INTRAMUSCULAR | Status: DC
  Administered 2023-01-15: 2 [IU] via SUBCUTANEOUS
  Administered 2023-01-16: 3 [IU] via SUBCUTANEOUS
  Administered 2023-01-16 (×2): 2 [IU] via SUBCUTANEOUS

## 2023-01-15 MED ORDER — INSULIN GLARGINE-YFGN 100 UNIT/ML ~~LOC~~ SOLN
7.0000 [IU] | Freq: Every day | SUBCUTANEOUS | Status: DC
  Administered 2023-01-15 – 2023-01-16 (×2): 7 [IU] via SUBCUTANEOUS
  Filled 2023-01-15 (×3): qty 0.07

## 2023-01-15 MED ORDER — PANTOPRAZOLE SODIUM 40 MG IV SOLR
40.0000 mg | Freq: Two times a day (BID) | INTRAVENOUS | Status: DC
  Administered 2023-01-15 – 2023-01-17 (×4): 40 mg via INTRAVENOUS
  Filled 2023-01-15 (×4): qty 10

## 2023-01-15 NOTE — H&P (View-Only) (Signed)
Progress Note  Primary GI: Dr. Barron Alvine  LOS: 0 days   Chief Complaint: acute on chronic IDA, cirrhosis with esophageal varices, GAVE, portal hypertensive gastropathy   Subjective   Bob Warren is a 77 y.o. male with past medical  history of diabetes, CAD, paroxysmal A-fib (not anticoagulated), hyperlipidemia, HTN, myasthenia gravis (not currently on IVIG), degenerative disc disease with chronic low back pain, IDA, cirrhosis with esophageal varices, GAVE, portal hypertensive gastropathy, ascites and others listed below presents for evaluation of acute on chronic anemia  Patient seen today in our office by Bob Mulling, PA-C.  He reported fatigue, weakness, and shortness of breath on exertion ongoing for the last 3 months.  With progressive symptoms, worsening anemia, and being FOBT + in office he was advised to go to ED for further evaluation. Patient denies overt bleeding. Having 1-2 Allers stools per day. Denies abdominal pain, nausea, vomiting.   08/27/2022 Hgb 10.3, platelets 68 12/16/2022 Hgb 7.6, MCV 81, WBC 4.4, platelets 77.  FOBT positive.   01/07/2023 Hgb 7.3, MCV 77.8, WBC 3.0, platelets 72  Patient has history of chronic anemia with baseline around 10.  Has had multiple endoscopic procedures  See Marchelle Folks Collier's office note from today for further details.   Objective   Vital signs in last 24 hours: Temp:  [97.9 F (36.6 C)] 97.9 F (36.6 C) (06/19 1240) Pulse Rate:  [70-72] 70 (06/19 1240) Resp:  [16] 16 (06/19 1240) BP: (134-150)/(56-70) 134/56 (06/19 1240) SpO2:  [100 %] 100 % (06/19 1240) Weight:  [82.6 kg-83 kg] 83 kg (06/19 1242)    General:   male in no acute distress, pale Heart:  Regular rate and rhythm; no murmurs Pulm: Clear anteriorly; no wheezing Abdomen: soft, nondistended, normal bowel sounds in all quadrants. Nontender without guarding. No organomegaly appreciated. Extremities:  bilateral pitting edema Neurologic:  Alert and  oriented x4;  No  focal deficits.  Psych:  Cooperative. Normal mood and affect.  Lab Results: Recent Labs    01/15/23 1308  WBC 2.8*  HGB 6.4*  HCT 22.9*  PLT 75*   BMET Recent Labs    01/15/23 1308  NA 134*  K 4.6  CL 110  CO2 20*  GLUCOSE 272*  BUN 19  CREATININE 1.36*  CALCIUM 8.2*   LFT Recent Labs    01/15/23 1308  PROT 6.6  ALBUMIN 2.9*  AST 20  ALT 21  ALKPHOS 70  BILITOT 0.3   PT/INR No results for input(s): "LABPROT", "INR" in the last 72 hours.   Scheduled Meds: Continuous Infusions:    Patient profile:   Bob Warren is a 77 y.o. 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 I thoroughly discussed the procedures to include nature, alternatives, benefits, and risks including but not limited to bleeding, perforation, infection, anesthesia/cardiac and pulmonary complications. Patient provides understanding and gave verbal consent to proceed. Continue Protonix IV 40mg  BID IV iron infusion Soft diet today NPO at midnight Continue daily CBC and transfuse to maintain HGB > 7  Bob Warren  01/15/2023, 1:56 PM   Attending Physician Note   I agree with the above APP note, impression and recommendations with my edits. See today's comprehensive office APP note as well.   Bob Head, MD [facility] See AMION, [facility], for our on call provider

## 2023-01-15 NOTE — ED Triage Notes (Signed)
Pt sent from pcp c/o weakness and low hgb

## 2023-01-15 NOTE — ED Provider Notes (Signed)
[place] EMERGENCY DEPARTMENT AT [facility] Ed Provider Note   CSN: [id number] Arrival date & time: 01/15/23  1235     History  Chief Complaint  Patient presents with   Weakness    Halden Masias is a 77 y.o. male. With past medical history of myasthenia gravis, DM, HTN, HLD, chronic back pain, iron deficiency anemia, gastric antral vascular ectasia (GAVE), esophageal varices and portal hypertension who presents to the emergency department with weakness.  Patient states that he has been weak and fatigued over the past 3 days or so. He believes he is having ongoing internal bleeding. He was sent here to the ER by his [facility] team due to anemia and need for admission. He states that since onset of weakness he has been short of breath intermittently. He denies having dark stools or BRBPR. Denies hematuria, abdominal pain, nausea or vomiting. He is not anticoagulated.   On chart review, appears that he was seen today at [facility]. He had previous hemoglobin last week which was 7.3 and +FOBT from May. Has been getting oral iron daily. Per provider note, likely having anemia secondary to GAVE or portal hypertension. They feel patient needs urgent endoscopy, possible IV iron or blood products. [facility] was reaching out to inpatient team here at [facility] to secure endoscopy for tomorrow.    Weakness Associated symptoms: shortness of breath        Home Medications Prior to Admission medications   Medication Sig Start Date End Date Taking? Authorizing Provider  amLODipine (NORVASC) 10 MG tablet Take 10 mg by mouth daily. 08/12/12   [provider]  atenolol (TENORMIN) 50 MG tablet Take 50 mg by mouth daily. 08/12/12   [provider]  atorvastatin (LIPITOR) 20 MG tablet Take 20 mg by mouth daily. 01/03/20   [provider]  cyanocobalamin (VITAMIN B12) 1000 MCG tablet Take 1,000 mcg by mouth daily.    [provider]  diltiazem (CARDIZEM CD)  120 MG 24 hr capsule Take 1 capsule (120 mg total) by mouth daily. 02/04/18   Lonia Blood, MD  fluticasone (FLONASE) 50 MCG/ACT nasal spray Place 1 spray into both nostrils as needed for allergies or rhinitis.    [provider]  furosemide (LASIX) 20 MG tablet Take 3 tablets (60 mg total) by mouth 2 (two) times daily. 02/03/18   Lonia Blood, MD  insulin aspart (NOVOLOG) 100 UNIT/ML injection Inject 0-9 Units into the skin 3 (three) times daily with meals. 02/03/18   Lonia Blood, MD  insulin glargine (LANTUS) 100 UNIT/ML injection Inject 0.14 mLs (14 Units total) into the skin at bedtime. 02/03/18   Lonia Blood, MD  losartan (COZAAR) 25 MG tablet Take 25 mg by mouth daily.    [provider]  metFORMIN (GLUCOPHAGE) 850 MG tablet Take 850 mg by mouth 2 (two) times daily with a meal.    [provider]  oxybutynin (DITROPAN XL) 15 MG 24 hr tablet Take 15 mg by mouth daily. 10/22/21   [provider]  pantoprazole (PROTONIX) 40 MG tablet Take 40 mg by mouth 2 (two) times daily. 12/03/21   [provider]  potassium chloride SA (KLOR-CON M) 20 MEQ tablet Take 40 mEq by mouth daily.    [provider]  sucralfate (CARAFATE) 1 g tablet Take 1 g by mouth 4 (four) times daily -  with meals and at bedtime. 12/16/22 12/16/23  [provider]  traMADol Janean Sark) 50  MG tablet Take 1 tablet (50 mg total) by mouth every 6 (six) hours as needed for moderate pain. 02/03/18   Lonia Blood, MD      Allergies    Imuran [azathioprine] and Lisinopril    Review of Systems   Review of Systems  Constitutional:  Positive for fatigue.  Respiratory:  Positive for shortness of breath.   Neurological:  Positive for weakness.  All other systems reviewed and are negative.   Physical Exam Updated Vital Signs BP (!) 134/56 (BP Location: Left Arm)   Pulse 70   Temp 97.9 F (36.6 C) (Oral)   Resp 16   Ht 5\' 11"  (1.803 m)   Wt 83 kg    SpO2 100%   BMI 25.52 kg/m  Physical Exam Vitals and nursing note reviewed.  Constitutional:      General: He is not in acute distress.    Appearance: He is ill-appearing.  HENT:     Head: Normocephalic.  Eyes:     General: No scleral icterus.    Extraocular Movements: Extraocular movements intact.  Cardiovascular:     Rate and Rhythm: Normal rate and regular rhythm.     Pulses: Normal pulses.     Heart sounds: No murmur heard. Pulmonary:     Effort: Pulmonary effort is normal. No respiratory distress.     Breath sounds: Normal breath sounds.  Abdominal:     General: Bowel sounds are normal. There is no distension.     Palpations: Abdomen is soft.     Tenderness: There is no abdominal tenderness.  Musculoskeletal:     Cervical back: Neck supple.     Right lower leg: Edema present.     Left lower leg: Edema present.  Skin:    General: Skin is warm and dry.     Capillary Refill: Capillary refill takes less than 2 seconds.  Neurological:     General: No focal deficit present.     Mental Status: He is alert and oriented to person, place, and time. Mental status is at baseline.  Psychiatric:        Mood and Affect: Mood normal.        Behavior: Behavior normal.     ED Results / Procedures / Treatments   Labs (all labs ordered are listed, but only abnormal results are displayed) Labs Reviewed  COMPREHENSIVE METABOLIC PANEL - Abnormal; Notable for the following components:      Result Value   Sodium 134 (*)    CO2 20 (*)    Glucose, Bld 272 (*)    Creatinine, Ser 1.36 (*)    Calcium 8.2 (*)    Albumin 2.9 (*)    GFR, Estimated 54 (*)    Anion gap 4 (*)    All other components within normal limits  CBC WITH DIFFERENTIAL/PLATELET - Abnormal; Notable for the following components:   WBC 2.8 (*)    RBC 2.78 (*)    Hemoglobin 6.4 (*)    HCT 22.9 (*)    MCH 23.0 (*)    MCHC 27.9 (*)    Platelets 75 (*)    Lymphs Abs 0.6 (*)    All other components within normal  limits  CBG MONITORING, ED - Abnormal; Notable for the following components:   Glucose-Capillary 232 (*)    All other components within normal limits  PROTIME-INR  TYPE AND SCREEN  PREPARE RBC (CROSSMATCH)    EKG None  Radiology No results found.  Procedures .Critical  Care  Performed by: Cristopher Peru, PA-C Authorized by: Cristopher Peru, PA-C   Critical care provider statement:    Critical care time (minutes):  40   Critical care time was exclusive of:  Separately billable procedures and treating other patients   Critical care was necessary to treat or prevent imminent or life-threatening deterioration of the following conditions:  Circulatory failure   Critical care was time spent personally by me on the following activities:  Blood draw for specimens, development of treatment plan with patient or surrogate, discussions with consultants, discussions with primary provider, examination of patient, evaluation of patient's response to treatment, interpretation of cardiac output measurements, obtaining history from patient or surrogate, review of old charts, re-evaluation of patient's condition, pulse oximetry, ordering and review of radiographic studies, ordering and review of laboratory studies and ordering and performing treatments and interventions   I assumed direction of critical care for this patient from another provider in my specialty: no     Care discussed with: admitting provider      Medications Ordered in ED Medications  0.9 %  sodium chloride infusion (Manually program via Guardrails IV Fluids) (has no administration in time range)  pantoprazole (PROTONIX) injection 40 mg (has no administration in time range)  insulin glargine-yfgn (SEMGLEE) injection 7 Units (has no administration in time range)    ED Course/ Medical Decision Making/ A&P    Medical Decision Making Amount and/or Complexity of Data Reviewed Labs: ordered.  Risk Prescription drug  management. Decision regarding hospitalization.  Initial Impression and Ddx 77 year old male who presents to the emergency department from GI clinic for anemia and admission Patient PMH that increases complexity of ED encounter:  GAVE, portal HTN, DM, MG, HLD, IDA, esophageal varices  Interpretation of Diagnostics I independent reviewed and interpreted the labs as followed: CMP with stable creatinine, no BUN elevation.  CBC with a hemoglobin of 6.4  - I independently visualized the following imaging with scope of interpretation limited to determining acute life threatening conditions related to emergency care: Not indicated  Patient Reassessment and Ultimate Disposition/Management 77 year old male who presents to the emergency department with symptomatic anemia.  He is overall well-appearing, alert and oriented.  He is hemodynamically stable without hypotension.  He is not hypoxic or short of breath.  He is not having any abdominal pain.  No signs of active hemorrhage at this time.  He is mildly pale.  Will obtain labs including type and screen and PT/INR.  CBC with hemoglobin of 6.4.  I have ordered 2 units of PRBCs to be transfused.  I have consented the patient.  I also called and spoke with [facility] who referred the patient to the ED.  Spoke with PA [facility] who recommends an IV dose of Protonix.  She has already come by to see the patient in the emergency department.  They recommend admission and they have already schedule him for endoscopy in the morning.  Consulted and spoke with Dr. Erenest Blank, hospitalist who agrees to admit the patient.  The patient agrees to be admitted.  At this time unclear etiology of his bleeding although he has a history of lower ectasia in his gastric fundus which may be the cause of his symptoms.  Again no obvious hemorrhage at this time requiring emergent intervention.   Patient management required discussion with the following services or consulting  groups:  Hospitalist Service and Gastroenterology  Complexity of Problems Addressed Chronic illness with exacerbation  Additional Data Reviewed and  Analyzed Further history obtained from: Past medical history and medications listed in the EMR, Prior ED visit notes, Recent PCP notes, Care Everywhere, and Prior labs/imaging results  Patient Encounter Risk Assessment Consideration of hospitalization  Final Clinical Impression(s) / ED Diagnoses Final diagnoses:  Symptomatic anemia    Rx / DC Orders ED Discharge Orders     None         Cristopher Peru, PA-C 01/15/23 1443    Lorre Nick, MD 01/16/23 1127

## 2023-01-15 NOTE — Progress Notes (Addendum)
  Progress Note  Primary GI: Dr. Cirigliano  LOS: 0 days   Chief Complaint: acute on chronic IDA, cirrhosis with esophageal varices, GAVE, portal hypertensive gastropathy   Subjective   Bob Warren is a 77 y.o. male with past medical  history of diabetes, CAD, paroxysmal A-fib (not anticoagulated), hyperlipidemia, HTN, myasthenia gravis (not currently on IVIG), degenerative disc disease with chronic low back pain, IDA, cirrhosis with esophageal varices, GAVE, portal hypertensive gastropathy, ascites and others listed below presents for evaluation of acute on chronic anemia  Patient seen today in our office by Amanda Collier, PA-C.  He reported fatigue, weakness, and shortness of breath on exertion ongoing for the last 3 months.  With progressive symptoms, worsening anemia, and being FOBT + in office he was advised to go to ED for further evaluation. Patient denies overt bleeding. Having 1-2 [place] stools per day. Denies abdominal pain, nausea, vomiting.   08/27/2022 Hgb 10.3, platelets 68 12/16/2022 Hgb 7.6, MCV 81, WBC 4.4, platelets 77.  FOBT positive.   01/07/2023 Hgb 7.3, MCV 77.8, WBC 3.0, platelets 72  Patient has history of chronic anemia with baseline around 10.  Has had multiple endoscopic procedures  See Amanda Collier's office note from today for further details.   Objective   Vital signs in last 24 hours: Temp:  [97.9 F (36.6 C)] 97.9 F (36.6 C) (06/19 1240) Pulse Rate:  [70-72] 70 (06/19 1240) Resp:  [16] 16 (06/19 1240) BP: (134-150)/(56-70) 134/56 (06/19 1240) SpO2:  [100 %] 100 % (06/19 1240) Weight:  [82.6 kg-83 kg] 83 kg (06/19 1242)    General:   male in no acute distress, pale Heart:  Regular rate and rhythm; no murmurs Pulm: Clear anteriorly; no wheezing Abdomen: soft, nondistended, normal bowel sounds in all quadrants. Nontender without guarding. No organomegaly appreciated. Extremities:  bilateral pitting edema Neurologic:  Alert and  oriented x4;  No  focal deficits.  Psych:  Cooperative. Normal mood and affect.  Lab Results: Recent Labs    01/15/23 1308  WBC 2.8*  HGB 6.4*  HCT 22.9*  PLT 75*   BMET Recent Labs    01/15/23 1308  NA 134*  K 4.6  CL 110  CO2 20*  GLUCOSE 272*  BUN 19  CREATININE 1.36*  CALCIUM 8.2*   LFT Recent Labs    01/15/23 1308  PROT 6.6  ALBUMIN 2.9*  AST 20  ALT 21  ALKPHOS 70  BILITOT 0.3   PT/INR No results for input(s): "LABPROT", "INR" in the last 72 hours.   Scheduled Meds: Continuous Infusions:    Patient profile:   Bob Warren is a 77 y.o. 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 I thoroughly discussed the procedures to include nature, alternatives, benefits, and risks including but not limited to bleeding, perforation, infection, anesthesia/cardiac and pulmonary complications. Patient provides understanding and gave verbal consent to proceed. Continue Protonix IV 40mg BID IV iron infusion Soft diet today NPO at midnight Continue daily CBC and transfuse to maintain HGB > 7     Bayley M McMichael  01/15/2023, 1:56 PM   Attending Physician Note   I agree with the above APP note, impression and recommendations with my edits. See today's comprehensive office APP note as well.   Zadkiel Dragan, MD FACG See [facility], [facility], for our on call provider     

## 2023-01-15 NOTE — Patient Instructions (Addendum)
Sent to the hospital _______________________________________________________  If your blood pressure at your visit was 140/90 or greater, please contact your primary care physician to follow up on this.  _______________________________________________________  If you are age 77 or older, your body mass index should be between 23-30. Your Body mass index is 25.38 kg/m. If this is out of the aforementioned range listed, please consider follow up with your Primary Care Provider.  If you are age 81 or younger, your body mass index should be between 19-25. Your Body mass index is 25.38 kg/m. If this is out of the aformentioned range listed, please consider follow up with your Primary Care Provider.   ________________________________________________________  The [facility] providers would like to encourage you to use [facility] to communicate with providers for non-urgent requests or questions.  Due to long hold times on the telephone, sending your provider a message by [facility] may be a faster and more efficient way to get a response.  Please allow 48 business hours for a response.  Please remember that this is for non-urgent requests.  _______________________________________________________  ________________________________________________________  The [facility] providers would like to encourage you to use [facility] to communicate with providers for non-urgent requests or questions.  Due to long hold times on the telephone, sending your provider a message by [facility] may be a faster and more efficient way to get a response.  Please allow 48 business hours for a response.  Please remember that this is for non-urgent requests.  _______________________________________________________ It was a pleasure to see you today!  Thank you for trusting me with your gastrointestinal care!

## 2023-01-15 NOTE — H&P (Signed)
History and Physical  Bob Warren ZOX:[id number] DOB: 03/15/1946 DOA: 01/15/2023  PCP: Bob Qua, MD   Chief Complaint: Weakness, Anemia  HPI: Bob Warren is a 77 y.o. male with medical history significant for Given a type 2 diabetes, CAD, paroxysmal atrial fibrillation not on anticoagulation, hypertension, Nash cirrhosis, GAVE, deficiency anemia on daily iron being admitted to the hospital with gradual worsening fatigue, and acute on chronic anemia.  Hemoglobin in January was 10.3, in May and earlier this month, hemoglobin was 7.6 and 7.3, he followed up today in GI clinic, where his hemoglobin was found to be 6.4.  History is provided by the patient, as well as his stepdaughter who is with him in the ER.  They state that he has not had any abdominal pain, however for the last 2 to 3 months, he has had gradual decline in terms of his energy levels, and at times he does feel a little presyncopal.  He denies any hematemesis, abdominal pain, nausea, or any melanotic stools.  ED Course: Evaluation in the ER, he is hemodynamically stable.  Lab work shows WBC 2.8, hemoglobin 6.4, platelets 75, creatinine 1.36 which is essentially his baseline.  ER provider has ordered 2 units of blood, discussed with gastroenterology who has seen the patient in consultation and plans upper endoscopy in the morning.  Review of Systems: Please see HPI for pertinent positives and negatives. A complete 10 system review of systems are otherwise negative.  Past Medical History:  Diagnosis Date   Arthritis    Diabetes (HCC)    High cholesterol    Hypertension    Kidney stone    Myasthenia gravis [facility])    Past Surgical History:  Procedure Laterality Date   COLONOSCOPY  01/09/2021   Dr.Raghid Montel [facility]. Poor prep.   ESOPHAGOGASTRODUODENOSCOPY N/A 01/14/2018   Procedure: ESOPHAGOGASTRODUODENOSCOPY (EGD);  Surgeon: Pasty Spillers, MD;  Location: [facility] ENDOSCOPY;  Service: Endoscopy;  Laterality:  N/A;   ESOPHAGOGASTRODUODENOSCOPY  01/09/2021   Dr.Raghid Judith [facility], [facility]. Hiatal Hernia. Findings concerning for nodular Barrett's disease. Biopsied. 2cm submucosal gastric body mass. Reosive gastritis, Biopsied.   ESOPHAGOGASTRODUODENOSCOPY (EGD) WITH PROPOFOL N/A 06/10/2022   Procedure: ESOPHAGOGASTRODUODENOSCOPY (EGD) WITH PROPOFOL;  Surgeon: Shellia Cleverly, DO;  Location: [facility] ENDOSCOPY;  Service: Gastroenterology;  Laterality: N/A;   HOT HEMOSTASIS N/A 06/10/2022   Procedure: HOT HEMOSTASIS (ARGON PLASMA COAGULATION/BICAP);  Surgeon: Shellia Cleverly, DO;  Location: [facility] ENDOSCOPY;  Service: Gastroenterology;  Laterality: N/A;   SKIN CANCER EXCISION Right 2016   Arm    Social History:  reports that he has quit smoking. He has never used smokeless tobacco. He reports that he does not drink alcohol and does not use drugs.   Allergies  Allergen Reactions   Imuran [Azathioprine] Other (See Comments)    Abnormal LIver functional    Lisinopril Cough    Family History  Problem Relation Age of Onset   Breast cancer Mother    Other Father        Gunshot wound   Diabetes Maternal Grandfather    Heart disease Maternal Grandfather    Colon cancer Neg Hx    Esophageal cancer Neg Hx    Stomach cancer Neg Hx      Prior to Admission medications   Medication Sig Start Date End Date Taking? Authorizing Provider  amLODipine (NORVASC) 10 MG tablet Take 10 mg by mouth daily. 08/12/12   [provider]  atenolol (TENORMIN) 50 MG tablet Take 50 mg by mouth  daily. 08/12/12   [provider]  atorvastatin (LIPITOR) 20 MG tablet Take 20 mg by mouth daily. 01/03/20   [provider]  cyanocobalamin (VITAMIN B12) 1000 MCG tablet Take 1,000 mcg by mouth daily.    [provider]  diltiazem (CARDIZEM CD) 120 MG 24 hr capsule Take 1 capsule (120 mg total) by mouth daily. 02/04/18   Lonia Blood, MD  fluticasone (FLONASE) 50 MCG/ACT nasal spray Place 1 spray  into both nostrils as needed for allergies or rhinitis.    [provider]  furosemide (LASIX) 20 MG tablet Take 3 tablets (60 mg total) by mouth 2 (two) times daily. 02/03/18   Lonia Blood, MD  insulin aspart (NOVOLOG) 100 UNIT/ML injection Inject 0-9 Units into the skin 3 (three) times daily with meals. 02/03/18   Lonia Blood, MD  insulin glargine (LANTUS) 100 UNIT/ML injection Inject 0.14 mLs (14 Units total) into the skin at bedtime. 02/03/18   Lonia Blood, MD  losartan (COZAAR) 25 MG tablet Take 25 mg by mouth daily.    [provider]  metFORMIN (GLUCOPHAGE) 850 MG tablet Take 850 mg by mouth 2 (two) times daily with a meal.    [provider]  oxybutynin (DITROPAN XL) 15 MG 24 hr tablet Take 15 mg by mouth daily. 10/22/21   [provider]  pantoprazole (PROTONIX) 40 MG tablet Take 40 mg by mouth 2 (two) times daily. 12/03/21   [provider]  potassium chloride SA (KLOR-CON M) 20 MEQ tablet Take 40 mEq by mouth daily.    [provider]  sucralfate (CARAFATE) 1 g tablet Take 1 g by mouth 4 (four) times daily -  with meals and at bedtime. 12/16/22 12/16/23  [provider]  traMADol (ULTRAM) 50 MG tablet Take 1 tablet (50 mg total) by mouth every 6 (six) hours as needed for moderate pain. 02/03/18   Lonia Blood, MD    Physical Exam: BP (!) 134/56 (BP Location: Left Arm)   Pulse 70   Temp 97.9 F (36.6 C) (Oral)   Resp 16   Ht 5\' 11"  (1.803 m)   Wt 83 kg   SpO2 100%   BMI 25.52 kg/m   General:  Alert, oriented, calm, in no acute distress, stepdaughter at the bedside, he looks comfortable, if a little pale Eyes: EOMI, clear conjuctivae, white sclerea Neck: supple, no masses, trachea mildline  Cardiovascular: RRR, no murmurs or rubs, lower extremity 1+ pitting edema which patient mentions is chronic Respiratory: clear to auscultation bilaterally, no wheezes, no crackles  Abdomen: soft, nontender, very  slightly he has chronic bilateral distended, normal bowel tones heard  Skin: dry, no rashes  Musculoskeletal: no joint effusions, normal range of motion  Psychiatric: appropriate affect, normal speech  Neurologic: extraocular muscles intact, clear speech, moving all extremities with intact sensorium          Labs on Admission:  Basic Metabolic Panel: Recent Labs  Lab 01/15/23 1308  NA 134*  K 4.6  CL 110  CO2 20*  GLUCOSE 272*  BUN 19  CREATININE 1.36*  CALCIUM 8.2*   Liver Function Tests: Recent Labs  Lab 01/15/23 1308  AST 20  ALT 21  ALKPHOS 70  BILITOT 0.3  PROT 6.6  ALBUMIN 2.9*   No results for input(s): "LIPASE", "AMYLASE" in the last 168 hours. No results for input(s): "AMMONIA" in the last 168 hours. CBC: Recent Labs  Lab 01/15/23 1308  WBC 2.8*  NEUTROABS 1.8  HGB 6.4*  HCT 22.9*  MCV 82.4  PLT 75*   Cardiac Enzymes: No results for input(s): "CKTOTAL", "CKMB", "CKMBINDEX", "TROPONINI" in the last 168 hours.  BNP (last 3 results) No results for input(s): "BNP" in the last 8760 hours.  ProBNP (last 3 results) No results for input(s): "PROBNP" in the last 8760 hours.  CBG: Recent Labs  Lab 01/15/23 1252  GLUCAP 232*    Radiological Exams on Admission: No results found.  Assessment/Plan This is a pleasant 77 year old gentleman with a history of insulin-dependent type 2 diabetes, myasthenia gravis, CAD, hypertension, paroxysmal atrial fibrillation not on anticoagulation, NASH cirrhosis, GAVE, iron deficiency anemia being admitted to the hospital with progressive weakness found to have symptomatic anemia without evidence of active blood loss.  Acute on chronic iron deficiency anemia-most likely due to slow upper GI bleed suspected, patient with no recent stigmata of active bleeding, he is hemodynamically stable. -Observation admission to progressive unit -Avoid blood thinners -Transfused 2 units of blood, posttransfusion hemoglobin to be  checked at 2100 -IV PPI twice daily -Patient has been seen by GI Dr. Russella Dar, who plans EGD tomorrow -Per GI, soft diet today, n.p.o. after midnight  Insulin-dependent type 2 diabetes -Will plan for diabetic diet when eating (soft diet tonight as above) -Home dose of Lantus 14 units nightly modified to 7 units Lantus tonight since he will be n.p.o. after midnight -Moderate dose sliding scale insulin  Hypertension-resume home amlodipine, ARB, diltiazem once reconciled  Paroxysmal atrial fibrillation-continue home diltiazem, no anticoagulation due to history of GI bleeding  Thrombocytopenia-presumably due to his known liver disease, chronic and stable  CKD stage III-renal function appears to be at baseline, follow with daily labs  DVT prophylaxis: SCDs only  CODE STATUS: Patient is DNR, confirmed with the patient at the time of admission.  Consults called: GI Dr. Russella Dar has seen the patient  Admission status: Observation  Time spent: 78 minutes  Beulah Matusek Sharlette Dense MD [facility] Pager [phone]  If 7PM-7AM, please contact night-coverage [email or web address] Password [facility]  01/15/2023, 2:41 PM

## 2023-01-15 NOTE — ED Notes (Signed)
ED TO INPATIENT HANDOFF REPORT  Name/Age/Gender Bob Warren 77 y.o. male  Code Status    Code Status Orders  (From admission, onward)           Start     Ordered   01/15/23 1450  Do not attempt resuscitation (DNR)  Continuous       Question Answer Comment  If patient has no pulse and is not breathing Do Not Attempt Resuscitation   If patient has a pulse and/or is breathing: Medical Treatment Goals LIMITED ADDITIONAL INTERVENTIONS: Use medication/IV fluids and cardiac monitoring as indicated; Do not use intubation or mechanical ventilation (DNI), also provide comfort medications.  Transfer to Progressive/Stepdown as indicated, avoid Intensive Care.   Consent: Discussion documented in EHR or advanced directives reviewed      01/15/23 1449           Code Status History     Date Active Date Inactive Code Status Order ID Comments User Context   01/15/2023 1439 01/15/2023 1449 Full Code [id number]  Maryln Gottron, MD ED   01/27/2018 0006 02/03/2018 1704 DNR [id number]  Eduard Clos, MD ED   01/13/2018 0527 01/15/2018 2101 DNR [id number]  Arnaldo Natal Inpatient   01/13/2018 0527 01/13/2018 0527 Full Code [id number]  Arnaldo Natal Inpatient   04/01/2017 0335 04/05/2017 1951 Full Code [id number]  Lorretta Harp, MD Inpatient       Home/SNF/Other Home  Chief Complaint Acute blood loss anemia [D62]  Level of Care/Admitting Diagnosis ED Disposition     ED Disposition  Admit   Condition  --   Comment  Hospital Area: [facility] [[id number]]  Level of Care: Progressive [102]  Admit to Progressive based on following criteria: GI, ENDOCRINE disease patients with GI bleeding, acute liver failure or pancreatitis, stable with diabetic ketoacidosis or thyrotoxicosis (hypothyroid) state.  May place patient in observation at [facility] or [facility] if equivalent level of care is available:: Yes  Covid Evaluation: Asymptomatic - no recent exposure (last 10  days) testing not required  Diagnosis: Acute blood loss anemia [244010]  Admitting Physician: Maryln Gottron [[id number]]  Attending Physician: Kirby Crigler, MIR [email or web address] [[id number]]          Medical History Past Medical History:  Diagnosis Date   Arthritis    Diabetes (HCC)    High cholesterol    Hypertension    Kidney stone    Myasthenia gravis (HCC)     Allergies Allergies  Allergen Reactions   Imuran [Azathioprine] Other (See Comments)    Abnormal LIver functional    Lisinopril Cough    IV Location/Drains/Wounds Patient Lines/Drains/Airways Status     Active Line/Drains/Airways     Name Placement date Placement time Site Days   Peripheral IV 01/15/23 20 G 1.25" Left Antecubital 01/15/23  1330  Antecubital  less than 1   External Urinary Catheter 02/01/18  1525  --  1809   Airway 06/10/22  0801  -- 219   Wound / Incision (Open or Dehisced) 01/26/18 Non-pressure wound Foot Right dorsal foot, started as blister, now unroofed with slough 01/26/18  0839  Foot  1815            Labs/Imaging Results for orders placed or performed during the hospital encounter of 01/15/23 (from the past 48 hour(s))  CBG monitoring, ED     Status: Abnormal   Collection Time: 01/15/23 12:52 PM  Result Value Ref Range   Glucose-Capillary 232 (H) 70 - 99 mg/dL  Comment: Glucose reference range applies only to samples taken after fasting for at least 8 hours.  Comprehensive metabolic panel     Status: Abnormal   Collection Time: 01/15/23  1:08 PM  Result Value Ref Range   Sodium 134 (L) 135 - 145 mmol/L   Potassium 4.6 3.5 - 5.1 mmol/L   Chloride 110 98 - 111 mmol/L   CO2 20 (L) 22 - 32 mmol/L   Glucose, Bld 272 (H) 70 - 99 mg/dL    Comment: Glucose reference range applies only to samples taken after fasting for at least 8 hours.   BUN 19 8 - 23 mg/dL   Creatinine, Ser 1.19 (H) 0.61 - 1.24 mg/dL   Calcium 8.2 (L) 8.9 - 10.3 mg/dL   Total Protein 6.6 6.5 - 8.1 g/dL   Albumin 2.9 (L) 3.5  - 5.0 g/dL   AST 20 15 - 41 U/L   ALT 21 0 - 44 U/L   Alkaline Phosphatase 70 38 - 126 U/L   Total Bilirubin 0.3 0.3 - 1.2 mg/dL   GFR, Estimated 54 (L) >60 mL/min    Comment: (NOTE) Calculated using the CKD-EPI Creatinine Equation (2021)    Anion gap 4 (L) 5 - 15    Comment: Performed at [facility], [address] [place], [place] [address]  CBC with Differential     Status: Abnormal   Collection Time: 01/15/23  1:08 PM  Result Value Ref Range   WBC 2.8 (L) 4.0 - 10.5 K/uL   RBC 2.78 (L) 4.22 - 5.81 MIL/uL   Hemoglobin 6.4 (LL) 13.0 - 17.0 g/dL    Comment: REPEATED TO VERIFY THIS CRITICAL RESULT HAS VERIFIED AND BEEN CALLED TO LAMB,S. RN BY NICOLE MCCOY ON 06 19 2024 AT 1347, AND HAS BEEN READ BACK.     HCT 22.9 (L) 39.0 - 52.0 %   MCV 82.4 80.0 - 100.0 fL   MCH 23.0 (L) 26.0 - 34.0 pg   MCHC 27.9 (L) 30.0 - 36.0 g/dL   RDW 95.6 21.3 - 08.6 %   Platelets 75 (L) 150 - 400 K/uL    Comment: SPECIMEN CHECKED FOR CLOTS Immature Platelet Fraction may be clinically indicated, consider ordering this additional test [id number] REPEATED TO VERIFY PLATELET COUNT CONFIRMED BY SMEAR    nRBC 0.0 0.0 - 0.2 %   Neutrophils Relative % 66 %   Neutro Abs 1.8 1.7 - 7.7 K/uL   Lymphocytes Relative 22 %   Lymphs Abs 0.6 (L) 0.7 - 4.0 K/uL   Monocytes Relative 8 %   Monocytes Absolute 0.2 0.1 - 1.0 K/uL   Eosinophils Relative 3 %   Eosinophils Absolute 0.1 0.0 - 0.5 K/uL   Basophils Relative 1 %   Basophils Absolute 0.0 0.0 - 0.1 K/uL   Immature Granulocytes 0 %   Abs Immature Granulocytes 0.01 0.00 - 0.07 K/uL    Comment: Performed at [facility], [address], [place], [place] [address]  Protime-INR     Status: None   Collection Time: 01/15/23  1:08 PM  Result Value Ref Range   Prothrombin Time 14.6 11.4 - 15.2 seconds   INR 1.1 0.8 - 1.2    Comment: (NOTE) INR goal varies based on device and disease states. Performed at [facility], [address] [place], [place] [address]   Type and screen [facility]     Status: None (Preliminary result)   Collection Time: 01/15/23  1:08 PM  Result Value Ref Range   ABO/RH(D) A POS    Antibody Screen NEG    Sample Expiration 01/18/2023,2359    Unit Number [id number]    Blood Component Type RED CELLS,LR    Unit division 00    Status of Unit ALLOCATED    Transfusion Status OK TO TRANSFUSE    Crossmatch Result Compatible    Unit Number [id number]    Blood Component Type RED CELLS,LR    Unit division 00    Status of Unit ISSUED    Transfusion Status OK TO TRANSFUSE    Crossmatch Result      Compatible Performed at [facility], [address] [place], [place] [address]   Prepare RBC (crossmatch)     Status: None   Collection Time: 01/15/23  1:56 PM  Result Value Ref Range   Order Confirmation      ORDER PROCESSED BY BLOOD BANK Performed at [facility], [address] [place], [place] [address]    No results found.  Pending Labs [employer] (From admission, onward)     Start     Ordered   Signed and Held  Hemoglobin A1c  (Glycemic Control (SSI)  Q 4 Hours / Glycemic Control (SSI)  AC +/- HS)  Once,   R       Comments: To assess prior glycemic control    Signed and Held   Signed and Held  Basic metabolic panel  Tomorrow morning,   R        Signed and Held   Signed and Held  CBC  Tomorrow morning,   R        Signed and Held   Signed and Held  Hemoglobin and hematocrit, blood  Once,   R        Signed and Held            Vitals/Pain Today's Vitals   01/15/23 1242 01/15/23 1451 01/15/23 1453 01/15/23 1515  BP:  (!) 137/58  (!) 127/57  Pulse:  79 83 69  Resp:  18 18   Temp:  97.8 F (36.6 C) 97.8 F (36.6 C) 97.8 F (36.6 C)  TempSrc:  Oral Oral Oral  SpO2:  100%  100%  Weight: 83 kg     Height: 5\' 11"  (1.803 m)     PainSc: 0-No pain       Isolation  Precautions No active isolations  Medications Medications  0.9 %  sodium chloride infusion (Manually program via Guardrails IV Fluids) (has no administration in time range)  pantoprazole (PROTONIX) injection 40 mg (has no administration in time range)  insulin glargine-yfgn (SEMGLEE) injection 7 Units (has no administration in time range)    Mobility manual wheelchair

## 2023-01-16 ENCOUNTER — Other Ambulatory Visit: Payer: Self-pay

## 2023-01-16 ENCOUNTER — Observation Stay (HOSPITAL_COMMUNITY): Payer: Medicare HMO

## 2023-01-16 ENCOUNTER — Encounter (HOSPITAL_COMMUNITY): Payer: Self-pay | Admitting: Internal Medicine

## 2023-01-16 ENCOUNTER — Observation Stay (HOSPITAL_BASED_OUTPATIENT_CLINIC_OR_DEPARTMENT_OTHER): Payer: Medicare HMO | Admitting: Anesthesiology

## 2023-01-16 ENCOUNTER — Encounter (HOSPITAL_COMMUNITY)
Admission: EM | Disposition: A | Discharge status: Home / Self Care | Payer: Self-pay | Source: Home / Self Care | Attending: Family Medicine

## 2023-01-16 ENCOUNTER — Telehealth: Payer: Self-pay

## 2023-01-16 ENCOUNTER — Observation Stay (HOSPITAL_COMMUNITY): Payer: Medicare HMO | Admitting: Anesthesiology

## 2023-01-16 DIAGNOSIS — I8500 Esophageal varices without bleeding: Secondary | ICD-10-CM

## 2023-01-16 DIAGNOSIS — M25551 Pain in right hip: Secondary | ICD-10-CM

## 2023-01-16 DIAGNOSIS — Z794 Long term (current) use of insulin: Secondary | ICD-10-CM

## 2023-01-16 DIAGNOSIS — K31819 Angiodysplasia of stomach and duodenum without bleeding: Secondary | ICD-10-CM | POA: Diagnosis not present

## 2023-01-16 DIAGNOSIS — Z66 Do not resuscitate: Secondary | ICD-10-CM | POA: Diagnosis present

## 2023-01-16 DIAGNOSIS — K766 Portal hypertension: Secondary | ICD-10-CM | POA: Diagnosis present

## 2023-01-16 DIAGNOSIS — K7460 Unspecified cirrhosis of liver: Secondary | ICD-10-CM | POA: Diagnosis present

## 2023-01-16 DIAGNOSIS — D509 Iron deficiency anemia, unspecified: Secondary | ICD-10-CM

## 2023-01-16 DIAGNOSIS — K922 Gastrointestinal hemorrhage, unspecified: Secondary | ICD-10-CM | POA: Diagnosis present

## 2023-01-16 DIAGNOSIS — I2510 Atherosclerotic heart disease of native coronary artery without angina pectoris: Secondary | ICD-10-CM | POA: Diagnosis present

## 2023-01-16 DIAGNOSIS — K31811 Angiodysplasia of stomach and duodenum with bleeding: Secondary | ICD-10-CM

## 2023-01-16 DIAGNOSIS — Z803 Family history of malignant neoplasm of breast: Secondary | ICD-10-CM | POA: Diagnosis not present

## 2023-01-16 DIAGNOSIS — Z8249 Family history of ischemic heart disease and other diseases of the circulatory system: Secondary | ICD-10-CM | POA: Diagnosis not present

## 2023-01-16 DIAGNOSIS — D649 Anemia, unspecified: Secondary | ICD-10-CM

## 2023-01-16 DIAGNOSIS — K3189 Other diseases of stomach and duodenum: Secondary | ICD-10-CM

## 2023-01-16 DIAGNOSIS — I8510 Secondary esophageal varices without bleeding: Secondary | ICD-10-CM | POA: Diagnosis not present

## 2023-01-16 DIAGNOSIS — K7581 Nonalcoholic steatohepatitis (NASH): Secondary | ICD-10-CM | POA: Diagnosis present

## 2023-01-16 DIAGNOSIS — I10 Essential (primary) hypertension: Secondary | ICD-10-CM | POA: Diagnosis present

## 2023-01-16 DIAGNOSIS — D62 Acute posthemorrhagic anemia: Secondary | ICD-10-CM | POA: Diagnosis present

## 2023-01-16 DIAGNOSIS — E7800 Pure hypercholesterolemia, unspecified: Secondary | ICD-10-CM | POA: Diagnosis present

## 2023-01-16 DIAGNOSIS — Z85828 Personal history of other malignant neoplasm of skin: Secondary | ICD-10-CM | POA: Diagnosis not present

## 2023-01-16 DIAGNOSIS — D696 Thrombocytopenia, unspecified: Secondary | ICD-10-CM | POA: Diagnosis present

## 2023-01-16 DIAGNOSIS — Z87891 Personal history of nicotine dependence: Secondary | ICD-10-CM | POA: Diagnosis not present

## 2023-01-16 DIAGNOSIS — K2270 Barrett's esophagus without dysplasia: Secondary | ICD-10-CM | POA: Diagnosis present

## 2023-01-16 DIAGNOSIS — I8511 Secondary esophageal varices with bleeding: Secondary | ICD-10-CM | POA: Diagnosis present

## 2023-01-16 DIAGNOSIS — E1122 Type 2 diabetes mellitus with diabetic chronic kidney disease: Secondary | ICD-10-CM | POA: Diagnosis present

## 2023-01-16 DIAGNOSIS — E119 Type 2 diabetes mellitus without complications: Secondary | ICD-10-CM

## 2023-01-16 DIAGNOSIS — Z7984 Long term (current) use of oral hypoglycemic drugs: Secondary | ICD-10-CM | POA: Diagnosis not present

## 2023-01-16 DIAGNOSIS — G7000 Myasthenia gravis without (acute) exacerbation: Secondary | ICD-10-CM | POA: Diagnosis present

## 2023-01-16 DIAGNOSIS — I480 Paroxysmal atrial fibrillation: Secondary | ICD-10-CM | POA: Diagnosis present

## 2023-01-16 DIAGNOSIS — M1611 Unilateral primary osteoarthritis, right hip: Secondary | ICD-10-CM | POA: Diagnosis present

## 2023-01-16 HISTORY — PX: ESOPHAGOGASTRODUODENOSCOPY: SHX5428

## 2023-01-16 HISTORY — PX: GASTRIC VARICES BANDING: SHX5519

## 2023-01-16 LAB — TYPE AND SCREEN
ABO/RH(D): A POS
Unit division: 0
Unit division: 0

## 2023-01-16 LAB — BPAM RBC
Blood Product Expiration Date: 202407102359
ISSUE DATE / TIME: 202406191735
Unit Type and Rh: 6200

## 2023-01-16 LAB — BASIC METABOLIC PANEL
Anion gap: 4 — ABNORMAL LOW (ref 5–15)
BUN: 16 mg/dL (ref 8–23)
CO2: 23 mmol/L (ref 22–32)
Calcium: 8.5 mg/dL — ABNORMAL LOW (ref 8.9–10.3)
Chloride: 111 mmol/L (ref 98–111)
Creatinine, Ser: 1.05 mg/dL (ref 0.61–1.24)
GFR, Estimated: >60 mL/min (ref >60)
Glucose, Bld: 168 mg/dL — ABNORMAL HIGH (ref 70–99)
Potassium: 4.3 mmol/L (ref 3.5–5.1)
Sodium: 138 mmol/L (ref 135–145)

## 2023-01-16 LAB — CBC
HCT: 29.9 % — ABNORMAL LOW (ref 39.0–52.0)
Hemoglobin: 9 g/dL — ABNORMAL LOW (ref 13.0–17.0)
MCH: 25 pg — ABNORMAL LOW (ref 26.0–34.0)
MCHC: 30.1 g/dL (ref 30.0–36.0)
MCV: 83.1 fL (ref 80.0–100.0)
Platelets: 77 10*3/uL — ABNORMAL LOW (ref 150–400)
RBC: 3.6 MIL/uL — ABNORMAL LOW (ref 4.22–5.81)
RDW: 15.4 % (ref 11.5–15.5)
WBC: 3.2 10*3/uL — ABNORMAL LOW (ref 4.0–10.5)
nRBC: 0 % (ref 0.0–0.2)

## 2023-01-16 LAB — GLUCOSE, CAPILLARY
Glucose-Capillary: 129 mg/dL — ABNORMAL HIGH (ref 70–99)
Glucose-Capillary: 148 mg/dL — ABNORMAL HIGH (ref 70–99)
Glucose-Capillary: 139 mg/dL — ABNORMAL HIGH (ref 70–99)
Glucose-Capillary: 126 mg/dL — ABNORMAL HIGH (ref 70–99)
Glucose-Capillary: 159 mg/dL — ABNORMAL HIGH (ref 70–99)
Glucose-Capillary: 92 mg/dL (ref 70–99)

## 2023-01-16 SURGERY — EGD (ESOPHAGOGASTRODUODENOSCOPY)
Anesthesia: Monitor Anesthesia Care

## 2023-01-16 MED ORDER — LACTATED RINGERS IV SOLN: INTRAVENOUS | Status: DC

## 2023-01-16 MED ORDER — LIDOCAINE 2% (20 MG/ML) 5 ML SYRINGE
INTRAMUSCULAR | Status: DC | PRN
  Administered 2023-01-16: 80 mg via INTRAVENOUS

## 2023-01-16 MED ORDER — HYDRALAZINE HCL 20 MG/ML IJ SOLN: 10.0000 mg | Freq: Four times a day (QID) | INTRAMUSCULAR | Status: DC | PRN

## 2023-01-16 MED ORDER — METHOCARBAMOL 1000 MG/10ML IJ SOLN
500.0000 mg | Freq: Once | INTRAVENOUS | Status: AC
  Administered 2023-01-16: 500 mg via INTRAVENOUS
  Filled 2023-01-16: qty 500

## 2023-01-16 MED ORDER — PROPOFOL 500 MG/50ML IV EMUL
INTRAVENOUS | Status: DC | PRN
  Administered 2023-01-16: 125 ug/kg/min via INTRAVENOUS

## 2023-01-16 MED ORDER — SULFAMETHOXAZOLE-TRIMETHOPRIM 400-80 MG PO TABS
2.0000 | ORAL_TABLET | Freq: Every day | ORAL | Status: DC
  Administered 2023-01-16 – 2023-01-17 (×2): 2 via ORAL
  Filled 2023-01-16 (×2): qty 2

## 2023-01-16 MED ORDER — PROPOFOL 10 MG/ML IV BOLUS
INTRAVENOUS | Status: DC | PRN
  Administered 2023-01-16: 20 mg via INTRAVENOUS
  Administered 2023-01-16: 30 mg via INTRAVENOUS

## 2023-01-16 MED ORDER — PROPOFOL 10 MG/ML IV BOLUS
INTRAVENOUS | Status: AC
  Filled 2023-01-16: qty 20

## 2023-01-16 MED ORDER — PROPOFOL 500 MG/50ML IV EMUL
INTRAVENOUS | Status: AC
  Filled 2023-01-16: qty 50

## 2023-01-16 NOTE — Progress Notes (Signed)
Transition of Care [facility]) - Inpatient Brief Assessment   Patient Details  Name: Mikaeel Berdan MRN: [id number] Date of Birth: [birth year]  [facility] CM/SW Contact:    Larrie Kass, LCSW Phone Number: 01/16/2023, 3:53 PM   Clinical Narrative: Transition of Care Department [facility]) has reviewed patient and no TOC needs have been identified at this time. We will continue to monitor patient advancement through interdisciplinary progression rounds. If new patient transition needs arise, please place a TOC consult.   Transition of Care Asessment: Insurance and Status: Insurance coverage has been reviewed Patient has primary care physician: Yes Home environment has been reviewed: yes   Prior/Current Home Services: No current home services Social Determinants of Health Reivew: SDOH reviewed no interventions necessary Readmission risk has been reviewed: Yes Transition of care needs: no transition of care needs at this time

## 2023-01-16 NOTE — Telephone Encounter (Signed)
Called and spoke with patient regarding recommendations as outlined below. Pt is aware that he will be due for repeat labs at our office in 2 weeks, no appt needed. Will remind pt closer to that time. Repeat EGD has been scheduled at [facility] on 02/20/23 at 9:15 am, arriving at 7:45 am with a care partner. Pt confirmed that he has MyChart and he is aware that I will send his EGD instructions there for review. Patient verbalized understanding and had no concerns at the end of the call.   2 week lab reminder and order in epic.  Ambulatory referral to GI in epic.  EGD instructions mailed and sent to patient via MyChart.

## 2023-01-16 NOTE — Care Management Obs Status (Signed)
MEDICARE OBSERVATION STATUS NOTIFICATION   Patient Details  Name: Bob Warren MRN: [id number] Date of Birth: 02/07/1946   Medicare Observation Status Notification Given:  Yes    Larrie Kass, LCSW 01/16/2023, 3:40 PM

## 2023-01-16 NOTE — Progress Notes (Addendum)
[facility]  PROGRESS NOTE  Bob Warren XBM:[id number] DOB: [birth year] DOA: 01/15/2023 PCP: Lindwood Qua, MD   Brief HPI:   77 year old male with history of diabetes melitis type II, CAD, paroxysmal atrial fibrillation, not on anticoagulation, hypertension, Nash cirrhosis, GAVE, iron deficiency anemia on daily iron came to hospital with worsening fatigue, acute on chronic anemia.  Hemoglobin was 10.3 in May, emoglobin was 7.6 and 7.3, he followed up today in GI clinic, where his hemoglobin was found to be 6.4.  Patient was sent to the ED for further evaluation.  Gastroenterology was consulted, plan for EGD today.    Assessment/Plan:   Acute on chronic iron deficiency anemia -Likely from upper GI bleed, plan for EGD today -S/p 2 units PRBC, hemoglobin is up to 9.0  Right hip pain -Patient said that he has had worsening of right hip pain for past few days, has been walking with a limp -Will obtain CT of right hip to rule out occult fracture -Will give 1 dose of Robaxin 500 mg IV  Diabetes mellitus type 2 -CBG well-controlled -Continue Lantus 7 units subcu nightly -Continue moderate sliding scale  Hypertension -Blood pressure is stable -Start hydralazine as needed IV -Restart home medications after EGD  Paroxysmal atrial fibrillation -continue home diltiazem, no anticoagulation due to history of GI bleeding  NASH cirrhosis -LFTs are stable   Thrombocytopenia -presumably due to his known liver disease, chronic and stable   CKD stage III -renal function appears to be at baseline, follow with daily labs    Medications     insulin aspart  0-15 Units Subcutaneous TID WC   insulin aspart  0-5 Units Subcutaneous QHS   insulin glargine-yfgn  7 Units Subcutaneous QHS   pantoprazole (PROTONIX) IV  40 mg Intravenous Q12H     Data Reviewed:   CBG:  Recent Labs  Lab 01/15/23 1650 01/15/23 1949 01/15/23 2207 01/16/23 0724 01/16/23 1132  GLUCAP 129* 148* 153*  139* 126*    SpO2: 100 %    Vitals:   01/15/23 2010 01/16/23 0055 01/16/23 0449 01/16/23 0938  BP: (!) 156/60 (!) 125/52 (!) 159/70 (!) 148/61  Pulse: 63 74 91 86  Resp: 18 18 18 20   Temp: 98.7 F (37.1 C) 98.6 F (37 C) 98.7 F (37.1 C) 98.6 F (37 C)  TempSrc: Oral Oral Oral Oral  SpO2: 100% 97% 96% 100%  Weight:      Height:          Data Reviewed:  Basic Metabolic Panel: Recent Labs  Lab 01/15/23 1308 01/16/23 0419  NA 134* 138  K 4.6 4.3  CL 110 111  CO2 20* 23  GLUCOSE 272* 168*  BUN 19 16  CREATININE 1.36* 1.05  CALCIUM 8.2* 8.5*    CBC: Recent Labs  Lab 01/15/23 1308 01/15/23 2120 01/16/23 0419  WBC 2.8*  --  3.2*  NEUTROABS 1.8  --   --   HGB 6.4* 8.9* 9.0*  HCT 22.9* 29.3* 29.9*  MCV 82.4  --  83.1  PLT 75*  --  77*    LFT Recent Labs  Lab 01/15/23 1308  AST 20  ALT 21  ALKPHOS 70  BILITOT 0.3  PROT 6.6  ALBUMIN 2.9*     Antibiotics: Anti-infectives (From admission, onward)    None        DVT prophylaxis: SCDs  Code Status: DNR  Family Communication: No family at bedside   CONSULTS gastroenterology   Subjective   Patient seen and  examined, complains of pain in the right hip especially on walking.  Patient's that pain has worsened over the past few days.   Physical Examination:   General: Appears in no acute distress Cardiovascular: S1-S2, regular, no murmur auscultated Respiratory: Lungs clear to auscultation bilaterally Abdomen: Abdomen is soft, nontender, no organomegaly Extremities: No edema in the lower extremities Musculoskeletal-tenderness in paravertebral muscles at L5-S1, straight leg raising positive at 60 degrees, tenderness noted at right hip on palpation Neurologic: Alert, oriented x 3, intact insight and judgment, no focal deficit noted   Status is: Inpatient:             Meredeth Ide   [facility] If 7PM-7AM, please contact night-coverage at [email or web address], Office   [phone]   01/16/2023, 12:28 PM  LOS: 0 days

## 2023-01-16 NOTE — Op Note (Addendum)
[facility] Patient Name: Bob Warren Procedure Date: 01/16/2023 MRN: [id number] Attending MD: Particia Lather , , [id number] Date of Birth: 1946/01/23 CSN: [id number] Age: 77 Admit Type: Outpatient Procedure:                Upper GI endoscopy Indications:              Iron deficiency anemia Providers:                Madelyn Brunner" Faythe Casa, RN, Salley Scarlet, Technician, [facility], CRNA Referring MD:             Hospitalist team Medicines:                Monitored Anesthesia Care Complications:            No immediate complications. Estimated Blood Loss:     Estimated blood loss was minimal. Procedure:                Pre-Anesthesia Assessment:                           - Prior to the procedure, a History and Physical                            was performed, and patient medications and                            allergies were reviewed. The patient's tolerance of                            previous anesthesia was also reviewed. The risks                            and benefits of the procedure and the sedation                            options and risks were discussed with the patient.                            All questions were answered, and informed consent                            was obtained. Prior Anticoagulants: The patient has                            taken no anticoagulant or antiplatelet agents. ASA                            Grade Assessment: III - A patient with severe                            systemic disease. After reviewing the risks and  benefits, the patient was deemed in satisfactory                            condition to undergo the procedure.                           After obtaining informed consent, the endoscope was                            passed under direct vision. Throughout the                            procedure, the patient's blood pressure,  pulse, and                            oxygen saturations were monitored continuously. The                            GIF-H190 ([id number]) Olympus endoscope was introduced                            through the mouth, and advanced to the second part                            of duodenum. The upper GI endoscopy was                            accomplished without difficulty. The patient                            tolerated the procedure well. Scope In: Scope Out: Findings:      Grade II varices were found in the distal esophagus.      Barrett's esophagus was present in the distal esophagus.      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 Five bands were successfully       placed over the nodular GAVE. There was no bleeding at the end of the       maneuver.      The examined duodenum was normal. Impression:               - Grade II esophageal varices.                           - Barrett's esophagus.                           - Portal hypertensive gastropathy.                           - Gastric antral vascular ectasia with oozing.                            Banded.                           -  Normal examined duodenum.                           - No specimens collected. Moderate Sedation:      Not Applicable - Patient had care per Anesthesia. Recommendation:           - Return patient to hospital ward for ongoing care.                           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                           - Continue home PPI BID                           - Originally I discussed keeping the patient here                            for another day to monitor his blood counts, but                            patient feels strongly that he would like to go                            home. Will  discuss with his hospitalist team.                           - Complete 7 days of antibiotic therapy with                            Bactrim for SBP prophylaxis in setting of GI bleed.                           - Recheck of Hb in 2 weeks.                           - Repeat upper endoscopy in 4 weeks for evaluation                            of GAVE. Asked for this to be scheduled by his                            primary GI physician Dr. Barron Alvine.                           - The findings and recommendations were discussed                            with the patient. Procedure Code(s):        --- Professional ---  43255, Esophagogastroduodenoscopy, flexible,                            transoral; with control of bleeding, any method Diagnosis Code(s):        --- Professional ---                           I85.00, Esophageal varices without bleeding                           K76.6, Portal hypertension                           K31.89, Other diseases of stomach and duodenum                           K31.811, Angiodysplasia of stomach and duodenum                            with bleeding                           D50.9, Iron deficiency anemia, unspecified CPT copyright 2022 American Medical Association. All rights reserved. The codes documented in this report are preliminary and upon coder review may  be revised to meet current compliance requirements. Dr Particia Lather "Alan Ripper" Leonides Schanz,  01/16/2023 1:48:00 PM Number of Addenda: 0

## 2023-01-16 NOTE — Anesthesia Postprocedure Evaluation (Signed)
Anesthesia Post Note  Patient: Bob Warren  Procedure(s) Performed: ESOPHAGOGASTRODUODENOSCOPY (EGD)     Patient location during evaluation: PACU Anesthesia Type: MAC Level of consciousness: awake and alert Pain management: pain level controlled Vital Signs Assessment: post-procedure vital signs reviewed and stable Respiratory status: spontaneous breathing Cardiovascular status: stable Anesthetic complications: no   No notable events documented.  Last Vitals:  Vitals:   01/16/23 1355 01/16/23 1415  BP: (!) 157/52 (!) 173/67  Pulse:  61  Resp: 19 16  Temp:  (!) 36.4 C  SpO2: 99% 99%    Last Pain:  Vitals:   01/16/23 1415  TempSrc: Oral  PainSc:                  Lewie Loron

## 2023-01-16 NOTE — Interval H&P Note (Signed)
History and Physical Interval Note:  01/16/2023 1:02 PM  Bob Warren  has presented today for surgery, with the diagnosis of Symptomatic IDA with history of cirrhosis, GAVE.  The various methods of treatment have been discussed with the patient and family. After consideration of risks, benefits and other options for treatment, the patient has consented to  Procedure(s): ESOPHAGOGASTRODUODENOSCOPY (EGD) (N/A) as a surgical intervention.  The patient's history has been reviewed, patient examined, no change in status, stable for surgery.  I have reviewed the patient's chart and labs.  Questions were answered to the patient's satisfaction.     Imogene Burn

## 2023-01-16 NOTE — Anesthesia Preprocedure Evaluation (Addendum)
Anesthesia Evaluation  Patient identified by MRN, date of birth, ID band Patient awake    Reviewed: Allergy & Precautions, NPO status , Patient's Chart, lab work & pertinent test results  History of Anesthesia Complications Negative for: history of anesthetic complications  Airway Mallampati: II  TM Distance: >3 FB Neck ROM: Full    Dental  (+) Dental Advisory Given, Teeth Intact   Pulmonary neg shortness of breath, neg sleep apnea, neg COPD, neg recent URI, former smoker   Pulmonary exam normal breath sounds clear to auscultation       Cardiovascular hypertension, Pt. on home beta blockers (-) angina + CAD  (-) Past MI, (-) Cardiac Stents and (-) CABG + dysrhythmias Atrial Fibrillation  Rhythm:Regular Rate:Normal  TTE 01/27/2018: - Left ventricle: The cavity size was normal. Wall thickness was increased in a pattern of mild LVH. Systolic function was normal. The estimated ejection fraction was in the range of 55% to 60%. Wall motion was normal; there were no regional wall motion abnormalities. Doppler parameters are consistent with abnormal left ventricular relaxation (grade 1 diastolic dysfunction).  - Aortic valve: There was trivial regurgitation.   Impressions:  - Normal LV systolic function; mild diastolic dysfunction; mild   LVH; trace AI.     Neuro/Psych  Neuromuscular disease (myasthenia gravis)    GI/Hepatic ,GERD (Barrett's esophagus)  Medicated,,(+) Cirrhosis   Esophageal Varices      Endo/Other  diabetes, Type 2, Insulin Dependent    Renal/GU Renal diseaseH/o nephrolithiasis     Musculoskeletal  (+) Arthritis ,    Abdominal  (+) - obese  Peds  Hematology  (+) Blood dyscrasia (iron deficiency), anemia   Anesthesia Other Findings 77 year old male with a history of diabetes, CAD, paroxysmal A-fib, hyperlipidemia, HTN, myasthenia gravis (not currently on IVIG), degenerative disc disease with chronic low back  pain, initially seen in the GI clinic in 12/2021 for evaluation of symptomatic IDA.  Reproductive/Obstetrics                             Anesthesia Physical Anesthesia Plan  ASA: 3  Anesthesia Plan: MAC   Post-op Pain Management: Minimal or no pain anticipated   Induction: Intravenous  PONV Risk Score and Plan: 1 and Propofol infusion, TIVA and Treatment may vary due to age or medical condition  Airway Management Planned: Natural Airway and Nasal Cannula  Additional Equipment:   Intra-op Plan:   Post-operative Plan:   Informed Consent: I have reviewed the patients History and Physical, chart, labs and discussed the procedure including the risks, benefits and alternatives for the proposed anesthesia with the patient or authorized representative who has indicated his/her understanding and acceptance.   Patient has DNR.  Discussed DNR with patient and Suspend DNR.   Dental advisory given  Plan Discussed with: CRNA  Anesthesia Plan Comments: ( )       Anesthesia Quick Evaluation

## 2023-01-16 NOTE — Transfer of Care (Signed)
Immediate Anesthesia Transfer of Care Note  Patient: Bob Warren  Procedure(s) Performed: ESOPHAGOGASTRODUODENOSCOPY (EGD)  Patient Location: PACU  Anesthesia Type:MAC  Level of Consciousness: awake, alert , and oriented  Airway & Oxygen Therapy: Patient Spontanous Breathing and Patient connected to face mask oxygen  Post-op Assessment: Report given to RN and Post -op Vital signs reviewed and stable  Post vital signs: Reviewed and stable  Last Vitals:  Vitals Value Taken Time  BP 153/57 01/16/23 1337  Temp    Pulse 72 01/16/23 1338  Resp 22 01/16/23 1338  SpO2 100 % 01/16/23 1338  Vitals shown include unvalidated device data.  Last Pain:  Vitals:   01/16/23 1235  TempSrc: Temporal  PainSc: 10-Worst pain ever      Patients Stated Pain Goal: 3 (01/16/23 1235)  Complications: No notable events documented.

## 2023-01-16 NOTE — Telephone Encounter (Signed)
-----   Message from Doree Albee, [place] sent at 01/16/2023  1:56 PM EDT ----- Regarding: Thanks! Thank you Dr. Leonides Schanz! I'm sending this to [facility] so we can set up Cbc in 2 weeks and Egd in the hospital in 4 weeks for GAVE.  Thanks [employer]! Marchelle Folks  ----- Message ----- From: Imogene Burn, MD Sent: 01/16/2023   1:54 PM EDT To: Doree Albee, PA-C; Shellia Cleverly, DO  Hi [place] and [place], I did an EGD on your patient Mr. [place] who presented with IDA. He had nodular GAVE with overlying inflammation that I banded. Suspect the inflammation was from prior APC treatment. I think he will need a repeat EGD in 4 weeks in the hospital for follow up of his GAVE. Also please recheck his CBC in 2 weeks. Not sure what your nurse is Gae Bon otherwise I would add her here too.  Thanks, Alan Ripper

## 2023-01-17 ENCOUNTER — Other Ambulatory Visit: Payer: Self-pay | Admitting: Gastroenterology

## 2023-01-17 DIAGNOSIS — D61818 Other pancytopenia: Secondary | ICD-10-CM

## 2023-01-17 DIAGNOSIS — D649 Anemia, unspecified: Principal | ICD-10-CM

## 2023-01-17 DIAGNOSIS — K922 Gastrointestinal hemorrhage, unspecified: Secondary | ICD-10-CM

## 2023-01-17 DIAGNOSIS — K31819 Angiodysplasia of stomach and duodenum without bleeding: Secondary | ICD-10-CM

## 2023-01-17 DIAGNOSIS — K7460 Unspecified cirrhosis of liver: Principal | ICD-10-CM

## 2023-01-17 DIAGNOSIS — D62 Acute posthemorrhagic anemia: Secondary | ICD-10-CM | POA: Diagnosis not present

## 2023-01-17 LAB — CBC
HCT: 29 % — ABNORMAL LOW (ref 39.0–52.0)
Hemoglobin: 8.7 g/dL — ABNORMAL LOW (ref 13.0–17.0)
MCH: 24.7 pg — ABNORMAL LOW (ref 26.0–34.0)
MCHC: 30 g/dL (ref 30.0–36.0)
MCV: 82.4 fL (ref 80.0–100.0)
Platelets: 72 10*3/uL — ABNORMAL LOW (ref 150–400)
RBC: 3.52 MIL/uL — ABNORMAL LOW (ref 4.22–5.81)
RDW: 15.9 % — ABNORMAL HIGH (ref 11.5–15.5)
WBC: 3.5 10*3/uL — ABNORMAL LOW (ref 4.0–10.5)
nRBC: 0 % (ref 0.0–0.2)

## 2023-01-17 LAB — GLUCOSE, CAPILLARY
Glucose-Capillary: 74 mg/dL (ref 70–99)
Glucose-Capillary: 114 mg/dL — ABNORMAL HIGH (ref 70–99)

## 2023-01-17 MED ORDER — SULFAMETHOXAZOLE-TRIMETHOPRIM 400-80 MG PO TABS: 2.0000 | ORAL_TABLET | Freq: Every day | ORAL | 0 refills | Status: AC

## 2023-01-17 MED ORDER — PANTOPRAZOLE SODIUM 40 MG PO TBEC: 40.0000 mg | DELAYED_RELEASE_TABLET | Freq: Two times a day (BID) | ORAL | 1 refills | Status: DC

## 2023-01-17 NOTE — Progress Notes (Signed)
Mobility Specialist - Progress Note   01/17/23 1102  Mobility  Activity Ambulated with assistance in hallway  Level of Assistance Standby assist, set-up cues, supervision of patient - no hands on  Assistive Device Front wheel walker  Distance Ambulated (ft) 350 ft  Range of Motion/Exercises Active  Activity Response Tolerated well  Mobility Referral Yes  $Mobility charge 1 Mobility  Mobility Specialist Start Time (ACUTE ONLY) 1047  Mobility Specialist Stop Time (ACUTE ONLY) 1100  Mobility Specialist Time Calculation (min) (ACUTE ONLY) 13 min   Pt was found in bed and agreeable to ambulate. Had no complaints during session. At EOS returned to use bathroom. NT notified.Pt was left in bathroom and NT in room.  Billey Chang Mobility Specialist

## 2023-01-17 NOTE — Discharge Summary (Addendum)
Physician Discharge Summary   Patient: Bob Warren MRN: [id number] DOB: October 25, 1945  Admit date:     01/15/2023  Discharge date: 01/17/23  Discharge Physician: Meredeth Ide   PCP: Lindwood Qua, MD   Recommendations at discharge:   Follow-up gastroenterology as outpatient  Discharge Diagnoses: Principal Problem:   Acute blood loss anemia Active Problems:   GI bleed  Resolved Problems:   * No resolved hospital problems. *  Hospital Course:  77 year old male with history of diabetes melitis type II, CAD, paroxysmal atrial fibrillation, not on anticoagulation, hypertension, Nash cirrhosis, GAVE, iron deficiency anemia on daily iron came to hospital with worsening fatigue, acute on chronic anemia.  Hemoglobin was 10.3 in May, emoglobin was 7.6 and 7.3, he followed up today in GI clinic, where his hemoglobin was found to be 6.4.  Patient was sent to the ED for further evaluation.  Gastroenterology was consulted, plan for EGD today.   Assessment and Plan:  Acute on chronic iron deficiency anemia -Likely from upper GI bleed, underwent EGD -EGD 6/20: Grade 2 esophageal varices, Barrett's esophagus, portal hypertensive gastropathy, gastric antral vascular ectasia wit-S/p 2 units PRBC, hemoglobin is up to 8.7 this morningh oozing-banded.  Normal duodenum.  No specimens collected  -Hgb check in 2 weeks -Repeat EGD in 4 weeks for evaluation of GAVE    Right hip pain -Patient said that he has had worsening of right hip pain for past few days, has been walking with a limp -CT of right hip shows severe arthritis but no fracture -Patient will need orthopedic referral as outpatient   Diabetes mellitus type 2 -CBG well-controlled -Continue home regimen   Hypertension -Blood pressure is stable -Continue home regimen   Paroxysmal atrial fibrillation -continue home diltiazem, no anticoagulation due to history of GI bleeding   NASH cirrhosis -LFTs are stable -Patient will be  discharged on Bactrim 2 tablets daily for 6 more days for SBP prophylaxis in setting of GI bleed   Thrombocytopenia -presumably due to his known liver disease, chronic and stable   CKD stage III -renal function appears to be at baseline        Consultants:  Procedures performed:  Disposition: Home Diet recommendation:  Discharge Diet Orders (From admission, onward)     Start     Ordered   01/17/23 0000  Diet - low sodium heart healthy        01/17/23 1218           Carb modified diet DISCHARGE MEDICATION: Allergies as of 01/17/2023       Reactions   Imuran [azathioprine] Other (See Comments)   Abnormal LIver functional    Lisinopril Cough        Medication List     STOP taking these medications    sucralfate 1 g tablet Commonly known as: CARAFATE       TAKE these medications    amLODipine 10 MG tablet Commonly known as: NORVASC Take 10 mg by mouth daily.   atenolol 50 MG tablet Commonly known as: TENORMIN Take 50 mg by mouth daily.   atorvastatin 20 MG tablet Commonly known as: LIPITOR Take 20 mg by mouth daily.   cyanocobalamin 1000 MCG tablet Commonly known as: VITAMIN B12 Take 1,000 mcg by mouth daily.   diltiazem 120 MG 24 hr capsule Commonly known as: CARDIZEM CD Take 1 capsule (120 mg total) by mouth daily.   fluticasone 50 MCG/ACT nasal spray Commonly known as: FLONASE Place 1 spray into both nostrils  as needed for allergies or rhinitis.   furosemide 20 MG tablet Commonly known as: LASIX Take 3 tablets (60 mg total) by mouth 2 (two) times daily.   insulin aspart 100 UNIT/ML injection Commonly known as: novoLOG Inject 0-9 Units into the skin 3 (three) times daily with meals.   insulin glargine 100 UNIT/ML injection Commonly known as: LANTUS Inject 0.14 mLs (14 Units total) into the skin at bedtime.   losartan 25 MG tablet Commonly known as: COZAAR Take 25 mg by mouth daily.   metFORMIN 850 MG tablet Commonly known as:  GLUCOPHAGE Take 850 mg by mouth 2 (two) times daily with a meal.   oxybutynin 15 MG 24 hr tablet Commonly known as: DITROPAN XL Take 15 mg by mouth daily.   pantoprazole 40 MG tablet Commonly known as: PROTONIX Take 1 tablet (40 mg total) by mouth 2 (two) times daily.   potassium chloride SA 20 MEQ tablet Commonly known as: KLOR-CON M Take 40 mEq by mouth daily.   sulfamethoxazole-trimethoprim 400-80 MG tablet Commonly known as: BACTRIM Take 2 tablets by mouth daily for 6 days. Start taking on: January 18, 2023   traMADol 50 MG tablet Commonly known as: ULTRAM Take 1 tablet (50 mg total) by mouth every 6 (six) hours as needed for moderate pain.        Follow-up Information     [facility] Follow up on 01/31/2023.   Specialty: Gastroenterology Why: Please go to [facility] office at [address] [place], [place] [address] on 01/31/23 between the hours of 7:30 am and 4:00 pm to have labs drawn. Our lab is located in the basement of the building. Contact information: [address] [place] [address] [phone]               Discharge Exam: Ceasar Mons Weights   01/15/23 1242 01/16/23 1235  Weight: 83 kg 83 kg   General-appears in no acute distress Heart-S1-S2, regular, no murmur auscultated Lungs-clear to auscultation bilaterally, no wheezing or crackles auscultated Abdomen-soft, nontender, no organomegaly Extremities-no edema in the lower extremities Neuro-alert, oriented x3, no focal deficit noted  Condition at discharge: good  The results of significant diagnostics from this hospitalization (including imaging, microbiology, ancillary and laboratory) are listed below for reference.   Imaging Studies: CT HIP RIGHT WO CONTRAST  Result Date: 01/16/2023 CLINICAL DATA:  Right hip pain. EXAM: CT OF THE RIGHT HIP WITHOUT CONTRAST TECHNIQUE: Multidetector CT imaging of the right hip was performed according to the  standard protocol. Multiplanar CT image reconstructions were also generated. RADIATION DOSE REDUCTION: This exam was performed according to the departmental dose-optimization program which includes automated exposure control, adjustment of the mA and/or kV according to patient size and/or use of iterative reconstruction technique. COMPARISON:  None Available. FINDINGS: The right hip is normally located. There are severe right hip joint degenerative changes were bone-on-bone contact, osteophytic spurring, bony eburnation and subchondral cystic change. Suspect underlying chondrocalcinosis. No acute hip fracture is identified. No evidence of AVN. The right hemipelvic bony structures are intact. The pubic symphysis and right SI joint are maintained. Chondrocalcinosis noted. The right hip and pelvic musculature are grossly normal. No obvious muscle tear, myositis or mass. No significant right-sided intrapelvic abnormalities are identified. There is a small amount of free pelvic fluid of uncertain significance or etiology. Advanced vascular calcifications. Small bilateral inguinal hernias containing fat. IMPRESSION: 1. Severe right hip joint degenerative changes but no acute hip fracture or AVN. 2.  Small amount of free pelvic fluid of uncertain significance or etiology. 3. Advanced vascular calcifications. Electronically Signed   By: Rudie Meyer M.D.   On: 01/16/2023 12:35    Microbiology: Results for orders placed or performed during the hospital encounter of 01/26/18  Urine culture     Status: Abnormal   Collection Time: 01/26/18  8:24 PM   Specimen: Urine, Clean Catch  Result Value Ref Range Status   Specimen Description URINE, CLEAN CATCH  Final   Special Requests NONE  Final   Culture (A)  Final    <10,000 COLONIES/mL INSIGNIFICANT GROWTH Performed at [facility], [address] [place], [place] [address]    Report Status 01/28/2018 FINAL  Final  Culture, blood (Routine x 2)     Status: None    Collection Time: 01/26/18  8:33 PM   Specimen: BLOOD  Result Value Ref Range Status   Specimen Description BLOOD LEFT ANTECUBITAL  Final   Special Requests   Final    BOTTLES DRAWN AEROBIC AND ANAEROBIC Blood Culture adequate volume   Culture   Final    NO GROWTH 5 DAYS Performed at [facility], [address], [place], [place] [address]    Report Status 01/31/2018 FINAL  Final  Culture, blood (Routine x 2)     Status: None   Collection Time: 01/26/18  8:33 PM   Specimen: BLOOD RIGHT FOREARM  Result Value Ref Range Status   Specimen Description BLOOD RIGHT FOREARM  Final   Special Requests   Final    BOTTLES DRAWN AEROBIC AND ANAEROBIC Blood Culture adequate volume   Culture   Final    NO GROWTH 5 DAYS Performed at [facility], [address], [place], [place] [address]    Report Status 01/31/2018 FINAL  Final  MRSA PCR Screening     Status: None   Collection Time: 01/27/18  8:52 AM   Specimen: Nasopharyngeal  Result Value Ref Range Status   MRSA by PCR NEGATIVE NEGATIVE Final    Comment:        The GeneXpert MRSA Assay (FDA approved for NASAL specimens only), is one component of a comprehensive MRSA colonization surveillance program. It is not intended to diagnose MRSA infection nor to guide or monitor treatment for MRSA infections. Performed at [facility], [address], [place], [place] [address]     Labs: CBC: Recent Labs  Lab 01/15/23 1308 01/15/23 2120 01/16/23 0419 01/17/23 0420  WBC 2.8*  --  3.2* 3.5*  NEUTROABS 1.8  --   --   --   HGB 6.4* 8.9* 9.0* 8.7*  HCT 22.9* 29.3* 29.9* 29.0*  MCV 82.4  --  83.1 82.4  PLT 75*  --  77* 72*   Basic Metabolic Panel: Recent Labs  Lab 01/15/23 1308 01/16/23 0419  NA 134* 138  K 4.6 4.3  CL 110 111  CO2 20* 23  GLUCOSE 272* 168*  BUN 19 16  CREATININE 1.36* 1.05  CALCIUM 8.2* 8.5*   Liver Function Tests: Recent Labs  Lab 01/15/23 1308  AST 20  ALT 21  ALKPHOS 70   BILITOT 0.3  PROT 6.6  ALBUMIN 2.9*   CBG: Recent Labs  Lab 01/16/23 1132 01/16/23 1631 01/16/23 2052 01/17/23 0734 01/17/23 1131  GLUCAP 126* 159* 92 74 114*    Discharge time spent: greater than 30 minutes.  Signed: Meredeth Ide, MD [facility] 01/17/2023

## 2023-01-17 NOTE — Progress Notes (Signed)
AVS given to patient and explained at the bedside. Medications and follow up appointments have been explained with pt verbalizing understanding.  

## 2023-01-17 NOTE — Progress Notes (Addendum)
Progress Note  Primary GI: Dr. Barron Alvine  LOS: 1 day   Chief Complaint:Symptomatic IDA with history of cirrhosis, GAVE, portal HTN    Subjective   Patient states he is doing well this morning and ready to go home.  Denies vomiting, nausea, abdominal pain.  Had a [place] bowel movement yesterday.  No bowel movements today.  Tolerating diet without difficulty    Objective   Vital signs in last 24 hours: Temp:  [97.5 F (36.4 C)-98.6 F (37 C)] 98.4 F (36.9 C) (06/21 0453) Pulse Rate:  [61-86] 75 (06/21 0453) Resp:  [16-24] 16 (06/21 0453) BP: (138-173)/(52-77) 145/69 (06/21 0453) SpO2:  [97 %-100 %] 97 % (06/21 0453) Weight:  [83 kg] 83 kg (06/20 1235) Last BM Date : 01/15/23 Last BM recorded by nurses in past 5 days No data recorded  General:   male in no acute distress  Heart:  Regular rate and rhythm; no murmurs Pulm: Clear anteriorly; no wheezing Abdomen: soft, nondistended, normal bowel sounds in all quadrants. Nontender without guarding. No organomegaly appreciated. Extremities:  No edema Neurologic:  Alert and  oriented x4;  No focal deficits.  Psych:  Cooperative. Normal mood and affect.  Intake/Output from previous day: 06/20 0701 - 06/21 0700 In: 384.8 [P.O.:360; I.V.:24.8] Out: 800 [Urine:800] Intake/Output this shift: Total I/O In: 600 [P.O.:600] Out: 550 [Urine:550]  Studies/Results: CT HIP RIGHT WO CONTRAST  Result Date: 01/16/2023 CLINICAL DATA:  Right hip pain. EXAM: CT OF THE RIGHT HIP WITHOUT CONTRAST TECHNIQUE: Multidetector CT imaging of the right hip was performed according to the standard protocol. Multiplanar CT image reconstructions were also generated. RADIATION DOSE REDUCTION: This exam was performed according to the departmental dose-optimization program which includes automated exposure control, adjustment of the mA and/or kV according to patient size and/or use of iterative reconstruction technique. COMPARISON:  None Available. FINDINGS:  The right hip is normally located. There are severe right hip joint degenerative changes were bone-on-bone contact, osteophytic spurring, bony eburnation and subchondral cystic change. Suspect underlying chondrocalcinosis. No acute hip fracture is identified. No evidence of AVN. The right hemipelvic bony structures are intact. The pubic symphysis and right SI joint are maintained. Chondrocalcinosis noted. The right hip and pelvic musculature are grossly normal. No obvious muscle tear, myositis or mass. No significant right-sided intrapelvic abnormalities are identified. There is a small amount of free pelvic fluid of uncertain significance or etiology. Advanced vascular calcifications. Small bilateral inguinal hernias containing fat. IMPRESSION: 1. Severe right hip joint degenerative changes but no acute hip fracture or AVN. 2. Small amount of free pelvic fluid of uncertain significance or etiology. 3. Advanced vascular calcifications. Electronically Signed   By: Rudie Meyer M.D.   On: 01/16/2023 12:35    Lab Results: Recent Labs    01/15/23 1308 01/15/23 2120 01/16/23 0419 01/17/23 0420  WBC 2.8*  --  3.2* 3.5*  HGB 6.4* 8.9* 9.0* 8.7*  HCT 22.9* 29.3* 29.9* 29.0*  PLT 75*  --  77* 72*   BMET Recent Labs    01/15/23 1308 01/16/23 0419  NA 134* 138  K 4.6 4.3  CL 110 111  CO2 20* 23  GLUCOSE 272* 168*  BUN 19 16  CREATININE 1.36* 1.05  CALCIUM 8.2* 8.5*   LFT Recent Labs    01/15/23 1308  PROT 6.6  ALBUMIN 2.9*  AST 20  ALT 21  ALKPHOS 70  BILITOT 0.3   PT/INR Recent Labs    01/15/23 1308  LABPROT 14.6  INR 1.1     Scheduled Meds:  insulin aspart  0-15 Units Subcutaneous TID WC   insulin aspart  0-5 Units Subcutaneous QHS   insulin glargine-yfgn  7 Units Subcutaneous QHS   pantoprazole (PROTONIX) IV  40 mg Intravenous Q12H   sulfamethoxazole-trimethoprim  2 tablet Oral Daily   Continuous Infusions:    Patient profile:   Nori Poland is a 77 y.o. 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  Normal duodenum.  No specimens collected    Plan:   -Patient can likely be discharged today.   -Hemoglobin back to baseline and no overt bleeding -PPI twice daily -Complete 7 days of antibiotic therapy with Bactrim for SBP prophylaxis in the setting of GI bleed -Hgb check in 2 weeks -Repeat EGD on July 25 for evaluation of GAVE -Soft diet   Bayley M McMichael  01/17/2023, 9:16 AM    Attending Physician Note   I have taken an interval history, reviewed the chart and examined the patient. I performed a substantive portion of this encounter, including complete performance of at least one of the key components, in conjunction with the APP. I agree with the APP's note, impression and recommendations with my edits. My additional impressions and recommendations are as follows.   IDA, cirrhosis, esophageal varices, GAVE, portal gastropathy. EGD with banding x 5 of nodular GAVE on 6/20. Hgb is stable.   Soft diet today Complete 7 day course of Bactrim Continue pantoprazole, sucralfate as prior to admission OK for discharge today from GI standpoint Repeat EGD with Dr. Barron Alvine on July 25 Outpatient follow up with Dr. Paul Half, MD [facility] See [facility], [facility], for our on call provider

## 2023-01-18 NOTE — Progress Notes (Signed)
Agree with the assessment and plan as outlined by Amanda Collier, PA-C. ? ?Saara Kijowski, DO, FACG ? ?

## 2023-01-20 ENCOUNTER — Encounter (HOSPITAL_COMMUNITY): Payer: Self-pay | Admitting: Internal Medicine

## 2023-01-28 ENCOUNTER — Telehealth: Payer: Self-pay | Admitting: *Deleted

## 2023-01-28 ENCOUNTER — Encounter: Payer: Self-pay | Admitting: *Deleted

## 2023-01-28 NOTE — Telephone Encounter (Signed)
-----   Message from Chrystie Nose, RN sent at 01/28/2023  8:01 AM EDT ----- Regarding: FW: 2-week lab reminder  ----- Message ----- From: Missy Sabins, RN Sent: 01/28/2023  12:00 AM EDT To: Missy Sabins, RN Subject: 2-week lab reminder                            CBC due - order in epic

## 2023-01-28 NOTE — Telephone Encounter (Signed)
Patient called and unable to contact via phone, unable to leave message. Sent message via MyChart to inform patient of CBC lab draw being due.

## 2023-01-29 NOTE — Telephone Encounter (Signed)
PT wants to know if he can come in Monday to do blood work. Please advise.

## 2023-01-29 NOTE — Telephone Encounter (Signed)
Patient informed it would be ok to come on Monday to have labs drawn on 02/03/23.

## 2023-02-03 ENCOUNTER — Other Ambulatory Visit (INDEPENDENT_AMBULATORY_CARE_PROVIDER_SITE_OTHER): Payer: Medicare HMO

## 2023-02-03 DIAGNOSIS — K31819 Angiodysplasia of stomach and duodenum without bleeding: Secondary | ICD-10-CM | POA: Diagnosis not present

## 2023-02-03 DIAGNOSIS — D509 Iron deficiency anemia, unspecified: Secondary | ICD-10-CM | POA: Diagnosis not present

## 2023-02-03 DIAGNOSIS — K922 Gastrointestinal hemorrhage, unspecified: Secondary | ICD-10-CM

## 2023-02-03 LAB — CBC WITH DIFFERENTIAL/PLATELET
Basophils Absolute: 0 10*3/uL (ref 0.0–0.1)
Basophils Relative: 1.1 % (ref 0.0–3.0)
Eosinophils Absolute: 0.1 10*3/uL (ref 0.0–0.7)
Eosinophils Relative: 2.6 % (ref 0.0–5.0)
HCT: 30.1 % — ABNORMAL LOW (ref 39.0–52.0)
Hemoglobin: 9.4 g/dL — ABNORMAL LOW (ref 13.0–17.0)
Lymphocytes Relative: 17.4 % (ref 12.0–46.0)
Lymphs Abs: 0.6 10*3/uL — ABNORMAL LOW (ref 0.7–4.0)
MCHC: 31.1 g/dL (ref 30.0–36.0)
MCV: 80.9 fl (ref 78.0–100.0)
Monocytes Absolute: 0.2 10*3/uL (ref 0.1–1.0)
Monocytes Relative: 7.1 % (ref 3.0–12.0)
Neutro Abs: 2.4 10*3/uL (ref 1.4–7.7)
Neutrophils Relative %: 71.8 % (ref 43.0–77.0)
Platelets: 76 10*3/uL — ABNORMAL LOW (ref 150.0–400.0)
RBC: 3.73 Mil/uL — ABNORMAL LOW (ref 4.22–5.81)
RDW: 21.1 % — ABNORMAL HIGH (ref 11.5–15.5)
WBC: 3.4 10*3/uL — ABNORMAL LOW (ref 4.0–10.5)

## 2023-02-19 ENCOUNTER — Encounter (HOSPITAL_COMMUNITY): Payer: Self-pay | Admitting: Gastroenterology

## 2023-02-19 NOTE — Anesthesia Preprocedure Evaluation (Signed)
Anesthesia Evaluation  Patient identified by MRN, date of birth, ID band Patient awake    Reviewed: Allergy & Precautions, H&P , NPO status , Patient's Chart, lab work & pertinent test results, reviewed documented beta blocker date and time   Airway Mallampati: II   Neck ROM: full    Dental   Pulmonary former smoker   breath sounds clear to auscultation       Cardiovascular hypertension, Pt. on medications  Rhythm:regular Rate:Normal  EKG 01/15/23 NSR, 1st deg AVB  Echo 01/27/2018 Left ventricle: The cavity size was normal. Wall thickness was    increased in a pattern of mild LVH. Systolic function was normal.    The estimated ejection fraction was in the range of 55% to 60%.    Wall motion was normal; there were no regional wall motion    abnormalities. Doppler parameters are consistent with abnormal    left ventricular relaxation (grade 1 diastolic dysfunction).  - Aortic valve: There was trivial regurgitation.      Neuro/Psych Myasthenia Gravis  Neuromuscular disease  negative psych ROS   GI/Hepatic Neg liver ROS,GERD  Medicated,,  Endo/Other  diabetes, Well Controlled, Type 2, Insulin Dependent, Oral Hypoglycemic Agents  HLD  Renal/GU Renal disease  negative genitourinary   Musculoskeletal  (+) Arthritis , Osteoarthritis,    Abdominal   Peds  Hematology  (+) Blood dyscrasia, anemia   Anesthesia Other Findings   Reproductive/Obstetrics                             Anesthesia Physical Anesthesia Plan  ASA: 3  Anesthesia Plan: MAC   Post-op Pain Management: Minimal or no pain anticipated   Induction: Intravenous  PONV Risk Score and Plan: 1 and Treatment may vary due to age or medical condition and Propofol infusion  Airway Management Planned: Natural Airway and Simple Face Mask  Additional Equipment: None  Intra-op Plan:   Post-operative Plan:   Informed Consent: I have  reviewed the patients History and Physical, chart, labs and discussed the procedure including the risks, benefits and alternatives for the proposed anesthesia with the patient or authorized representative who has indicated his/her understanding and acceptance.     Dental advisory given  Plan Discussed with: CRNA, Anesthesiologist and Surgeon  Anesthesia Plan Comments:         Anesthesia Quick Evaluation

## 2023-02-20 ENCOUNTER — Ambulatory Visit (HOSPITAL_COMMUNITY): Payer: Medicare HMO | Admitting: Anesthesiology

## 2023-02-20 ENCOUNTER — Other Ambulatory Visit: Payer: Self-pay

## 2023-02-20 ENCOUNTER — Encounter (HOSPITAL_COMMUNITY)
Admission: RE | Disposition: A | Discharge status: Home / Self Care | Payer: Self-pay | Source: Home / Self Care | Attending: Gastroenterology

## 2023-02-20 ENCOUNTER — Encounter (HOSPITAL_COMMUNITY): Payer: Self-pay | Admitting: Gastroenterology

## 2023-02-20 ENCOUNTER — Ambulatory Visit (HOSPITAL_COMMUNITY)
Admission: RE | Admit: 2023-02-20 | Discharge: 2023-02-20 | Disposition: A | Discharge status: Home / Self Care | Payer: Medicare HMO | Attending: Gastroenterology | Admitting: Gastroenterology

## 2023-02-20 ENCOUNTER — Ambulatory Visit (HOSPITAL_BASED_OUTPATIENT_CLINIC_OR_DEPARTMENT_OTHER): Payer: Medicare HMO | Admitting: Anesthesiology

## 2023-02-20 DIAGNOSIS — Z794 Long term (current) use of insulin: Secondary | ICD-10-CM | POA: Diagnosis not present

## 2023-02-20 DIAGNOSIS — K31819 Angiodysplasia of stomach and duodenum without bleeding: Secondary | ICD-10-CM

## 2023-02-20 DIAGNOSIS — K2270 Barrett's esophagus without dysplasia: Secondary | ICD-10-CM

## 2023-02-20 DIAGNOSIS — I10 Essential (primary) hypertension: Secondary | ICD-10-CM | POA: Diagnosis not present

## 2023-02-20 DIAGNOSIS — Z87891 Personal history of nicotine dependence: Secondary | ICD-10-CM | POA: Insufficient documentation

## 2023-02-20 DIAGNOSIS — K7460 Unspecified cirrhosis of liver: Secondary | ICD-10-CM | POA: Insufficient documentation

## 2023-02-20 DIAGNOSIS — K219 Gastro-esophageal reflux disease without esophagitis: Secondary | ICD-10-CM | POA: Diagnosis not present

## 2023-02-20 DIAGNOSIS — K2289 Other specified disease of esophagus: Secondary | ICD-10-CM | POA: Diagnosis not present

## 2023-02-20 DIAGNOSIS — K3189 Other diseases of stomach and duodenum: Secondary | ICD-10-CM | POA: Diagnosis not present

## 2023-02-20 DIAGNOSIS — K766 Portal hypertension: Secondary | ICD-10-CM | POA: Diagnosis not present

## 2023-02-20 DIAGNOSIS — E785 Hyperlipidemia, unspecified: Secondary | ICD-10-CM | POA: Insufficient documentation

## 2023-02-20 DIAGNOSIS — D500 Iron deficiency anemia secondary to blood loss (chronic): Secondary | ICD-10-CM | POA: Diagnosis not present

## 2023-02-20 DIAGNOSIS — E119 Type 2 diabetes mellitus without complications: Secondary | ICD-10-CM | POA: Diagnosis not present

## 2023-02-20 DIAGNOSIS — I8501 Esophageal varices with bleeding: Secondary | ICD-10-CM

## 2023-02-20 DIAGNOSIS — Z7984 Long term (current) use of oral hypoglycemic drugs: Secondary | ICD-10-CM

## 2023-02-20 DIAGNOSIS — D509 Iron deficiency anemia, unspecified: Secondary | ICD-10-CM

## 2023-02-20 DIAGNOSIS — I8510 Secondary esophageal varices without bleeding: Secondary | ICD-10-CM | POA: Insufficient documentation

## 2023-02-20 DIAGNOSIS — K31811 Angiodysplasia of stomach and duodenum with bleeding: Secondary | ICD-10-CM | POA: Diagnosis not present

## 2023-02-20 DIAGNOSIS — I8500 Esophageal varices without bleeding: Secondary | ICD-10-CM | POA: Diagnosis not present

## 2023-02-20 HISTORY — PX: ESOPHAGOGASTRODUODENOSCOPY (EGD) WITH PROPOFOL: SHX5813

## 2023-02-20 HISTORY — PX: GASTRIC VARICES BANDING: SHX5519

## 2023-02-20 LAB — GLUCOSE, CAPILLARY: Glucose-Capillary: 126 mg/dL — ABNORMAL HIGH (ref 70–99)

## 2023-02-20 SURGERY — ESOPHAGOGASTRODUODENOSCOPY (EGD) WITH PROPOFOL
Anesthesia: Monitor Anesthesia Care

## 2023-02-20 MED ORDER — LIDOCAINE 2% (20 MG/ML) 5 ML SYRINGE
INTRAMUSCULAR | Status: DC | PRN
  Administered 2023-02-20: 100 mg via INTRAVENOUS

## 2023-02-20 MED ORDER — PROPOFOL 500 MG/50ML IV EMUL
INTRAVENOUS | Status: DC | PRN
  Administered 2023-02-20: 150 ug/kg/min via INTRAVENOUS

## 2023-02-20 MED ORDER — LACTATED RINGERS IV SOLN: INTRAVENOUS | Status: DC

## 2023-02-20 MED ORDER — PROPOFOL 10 MG/ML IV BOLUS
INTRAVENOUS | Status: DC | PRN
Start: 2023-02-20 — End: 2023-02-20
  Administered 2023-02-20: 20 mg via INTRAVENOUS
  Administered 2023-02-20: 30 mg via INTRAVENOUS

## 2023-02-20 MED ORDER — SODIUM CHLORIDE 0.9 % IV SOLN: INTRAVENOUS | Status: DC

## 2023-02-20 MED ORDER — GLYCOPYRROLATE PF 0.2 MG/ML IJ SOSY
PREFILLED_SYRINGE | INTRAMUSCULAR | Status: DC | PRN
  Administered 2023-02-20: .1 mg via INTRAVENOUS

## 2023-02-20 MED ORDER — PHENYLEPHRINE 80 MCG/ML (10ML) SYRINGE FOR IV PUSH (FOR BLOOD PRESSURE SUPPORT)
PREFILLED_SYRINGE | INTRAVENOUS | Status: DC | PRN
  Administered 2023-02-20 (×3): 80 ug via INTRAVENOUS

## 2023-02-20 SURGICAL SUPPLY — 15 items

## 2023-02-20 NOTE — Discharge Instructions (Signed)
YOU HAD AN ENDOSCOPIC PROCEDURE TODAY: Refer to the procedure report and other information in the discharge instructions given to you for any specific questions about what was found during the examination. If this information does not answer your questions, please call [place] office at [phone] to clarify.   YOU SHOULD EXPECT: Some feelings of bloating in the abdomen. Passage of more gas than usual. Walking can help get rid of the air that was put into your GI tract during the procedure and reduce the bloating. If you had a lower endoscopy (such as a colonoscopy or flexible sigmoidoscopy) you may notice spotting of blood in your stool or on the toilet paper. Some abdominal soreness may be present for a day or two, also.  DIET: Your first meal following the procedure should be a light meal and then it is ok to progress to your normal diet. A half-sandwich or bowl of soup is an example of a good first meal. Heavy or fried foods are harder to digest and may make you feel nauseous or bloated. Drink plenty of fluids but you should avoid alcoholic beverages for 24 hours. If you had a esophageal dilation, please see attached instructions for diet.    ACTIVITY: Your care partner should take you home directly after the procedure. You should plan to take it easy, moving slowly for the rest of the day. You can resume normal activity the day after the procedure however YOU SHOULD NOT DRIVE, use power tools, machinery or perform tasks that involve climbing or major physical exertion for 24 hours (because of the sedation medicines used during the test).   SYMPTOMS TO REPORT IMMEDIATELY: A gastroenterologist can be reached at any hour. Please call [phone]  for any of the following symptoms:   Following upper endoscopy (EGD) Vomiting of blood or coffee ground material  New, significant abdominal pain  New, significant chest pain or pain under the shoulder blades  Painful or persistently difficult swallowing   New shortness of breath  Black, tarry-looking or red, bloody stools  FOLLOW UP:  If any biopsies were taken you will be contacted by phone or by letter within the next 1-3 weeks. Call [phone]  if you have not heard about the biopsies in 3 weeks.  Please also call with any specific questions about appointments or follow up tests.

## 2023-02-20 NOTE — Transfer of Care (Signed)
Immediate Anesthesia Transfer of Care Note  Patient: Bob Warren  Procedure(s) Performed: ESOPHAGOGASTRODUODENOSCOPY (EGD) WITH PROPOFOL GASTRIC VARICES BANDING  Patient Location: Endoscopy Unit  Anesthesia Type:MAC  Level of Consciousness: drowsy  Airway & Oxygen Therapy: Patient Spontanous Breathing  Post-op Assessment: Report given to RN and Post -op Vital signs reviewed and stable  Post vital signs: Reviewed and stable  Last Vitals:  Vitals Value Taken Time  BP 158/80 02/20/23 0954  Temp 36.4 C 02/20/23 0954  Pulse 85 02/20/23 0958  Resp 19 02/20/23 0958  SpO2 98 % 02/20/23 0958  Vitals shown include unfiled device data.  Last Pain:  Vitals:   02/20/23 0954  TempSrc: Temporal  PainSc: Asleep         Complications: No notable events documented.

## 2023-02-20 NOTE — Interval H&P Note (Signed)
History and Physical Interval Note:  02/20/2023 9:06 AM  Bob Warren  has presented today for surgery, with the diagnosis of IDA/GAVE.  The various methods of treatment have been discussed with the patient and family. After consideration of risks, benefits and other options for treatment, the patient has consented to  Procedure(s): ESOPHAGOGASTRODUODENOSCOPY (EGD) WITH PROPOFOL (N/A) as a surgical intervention.  The patient's history has been reviewed, patient examined, no change in status, stable for surgery.  I have reviewed the patient's chart and labs.  Questions were answered to the patient's satisfaction.     Verlin Dike Mckay Tegtmeyer

## 2023-02-20 NOTE — H&P (Signed)
GASTROENTEROLOGY PROCEDURE H&P NOTE   Primary Care Physician: Lindwood Qua, MD    Reason for Procedure:  GAVE, symptomatic iron deficiency anemia, cirrhosis, portal hypertension, esophageal varices  Plan:    EGD   Patient is appropriate for endoscopic procedure(s) at goal: Hospital Endoscopy Unit.  The nature of the procedure, as well as the risks, benefits, and alternatives were carefully and thoroughly reviewed with the patient. Ample time for discussion and questions allowed. The patient understood, was satisfied, and agreed to proceed.     HPI: Bob Warren is a 77 y.o. male who presents for EGD for diagnostic and therapeutic intent.  Patient with a history of cirrhosis complicated by esophageal varices, portal hypertensive gastropathy, GAVE, ascites, with hospital admission on 01/15/2023 with symptomatic iron deficiency anemia along with known B12 deficiency.  Admission hemoglobin 6.4 requiring PRBC transfusion.  EGD on 01/16/2023 notable for grade 2 esophageal varices, Barrett's esophagus, poor hypertensive gastropathy, and nodular GAVE which was treated with band ligation.  No recurrent bleeding since hospital discharge.  Repeat H/H was 9.4/30.12 weeks ago.  Presents today for repeat upper endoscopy to evaluate for appropriate healing and repeat endoscopic intervention as appropriate.  Past Medical History:  Diagnosis Date   Arthritis    Diabetes (HCC)    High cholesterol    Hypertension    Kidney stone    Myasthenia gravis [facility])     Past Surgical History:  Procedure Laterality Date   COLONOSCOPY  01/09/2021   Dr.Raghid Montel [facility]. Poor prep.   ESOPHAGOGASTRODUODENOSCOPY N/A 01/14/2018   Procedure: ESOPHAGOGASTRODUODENOSCOPY (EGD);  Surgeon: Pasty Spillers, MD;  Location: [facility] ENDOSCOPY;  Service: Endoscopy;  Laterality: N/A;   ESOPHAGOGASTRODUODENOSCOPY  01/09/2021   Dr.Raghid Judith Part, [facility]. Hiatal Hernia. Findings concerning for nodular  Barrett's disease. Biopsied. 2cm submucosal gastric body mass. Reosive gastritis, Biopsied.   ESOPHAGOGASTRODUODENOSCOPY N/A 01/16/2023   Procedure: ESOPHAGOGASTRODUODENOSCOPY (EGD);  Surgeon: Imogene Burn, MD;  Location: [facility] ENDOSCOPY;  Service: Gastroenterology;  Laterality: N/A;   ESOPHAGOGASTRODUODENOSCOPY (EGD) WITH PROPOFOL N/A 06/10/2022   Procedure: ESOPHAGOGASTRODUODENOSCOPY (EGD) WITH PROPOFOL;  Surgeon: Shellia Cleverly, DO;  Location: [facility] ENDOSCOPY;  Service: Gastroenterology;  Laterality: N/A;   GASTRIC VARICES BANDING  01/16/2023   Procedure: GASTRIC GAVE BANDING;  Surgeon: Imogene Burn, MD;  Location: [facility] ENDOSCOPY;  Service: Gastroenterology;;   HOT HEMOSTASIS N/A 06/10/2022   Procedure: HOT HEMOSTASIS (ARGON PLASMA COAGULATION/BICAP);  Surgeon: Shellia Cleverly, DO;  Location: [facility] ENDOSCOPY;  Service: Gastroenterology;  Laterality: N/A;   SKIN CANCER EXCISION Right 2016   Arm    Prior to Admission medications   Medication Sig Start Date End Date Taking? Authorizing Provider  atorvastatin (LIPITOR) 20 MG tablet Take 20 mg by mouth in the morning. 01/03/20  Yes [provider]  cephALEXin (KEFLEX) 500 MG capsule Take 500 mg by mouth in the morning, at noon, and at bedtime.   Yes [provider]  cyanocobalamin (VITAMIN B12) 1000 MCG tablet Take 1,000 mcg by mouth in the morning.   Yes [provider]  diltiazem (CARDIZEM CD) 120 MG 24 hr capsule Take 1 capsule (120 mg total) by mouth daily. 02/04/18  Yes Lonia Blood, MD  ferrous sulfate 325 (65 FE) MG EC tablet Take 325 mg by mouth in the morning.   Yes [provider]  furosemide (LASIX) 40 MG tablet Take 40 mg by mouth in the morning.   Yes [provider]  insulin aspart (NOVOLOG) 100 UNIT/ML injection  Inject 4-8 Units into the skin 3 (three) times daily before meals. USE PER SLIDING SCALE, MAX 60 UNITS PER DAY (HOLD FOR BG LESS THAN 200) 01/28/23 01/28/24 Yes [provider]  insulin glargine (LANTUS) 100 UNIT/ML injection Inject 0.14 mLs (14 Units total) into the skin at bedtime. 02/03/18  Yes Lonia Blood, MD  losartan (COZAAR) 25 MG tablet Take 25 mg by mouth in the morning.   Yes [provider]  metFORMIN (GLUCOPHAGE) 850 MG tablet Take 850 mg by mouth 2 (two) times daily with a meal.   Yes [provider]  Omega-3 Fatty Acids (FISH OIL PO) Take 1 capsule by mouth in the morning.   Yes [provider]  oxybutynin (DITROPAN XL) 15 MG 24 hr tablet Take 15 mg by mouth in the morning. 10/22/21  Yes [provider]  pantoprazole (PROTONIX) 40 MG tablet Take 1 tablet (40 mg total) by mouth 2 (two) times daily. 01/17/23  Yes Sharl Ma, Sarina Ill, MD  potassium chloride SA (KLOR-CON M) 20 MEQ tablet Take 40 mEq by mouth in the morning.   Yes [provider]  solifenacin (VESICARE) 5 MG tablet Take 5 mg by mouth in the morning.   Yes [provider]  sucralfate (CARAFATE) 1 g tablet Take 1 g by mouth in the morning and at bedtime.   Yes [provider]  traMADol (ULTRAM) 50 MG tablet Take 1 tablet (50 mg total) by mouth every 6 (six) hours as needed for moderate pain. Patient taking differently: Take 50 mg by mouth 2 (two) times daily as needed (PAIN.). 02/03/18  Yes Lonia Blood, MD    Current Facility-Administered Medications  Medication Dose Route Frequency Provider Last Rate Last Admin   0.9 %  sodium chloride infusion   Intravenous Continuous Atharv Barriere V, DO       lactated ringers infusion   Intravenous Continuous Dublin Cantero V, DO 50 mL/hr at 02/20/23 4403 Continued from Pre-op at 02/20/23 4742    Allergies as of 01/16/2023 - Review Complete 01/16/2023  Allergen Reaction Noted   Imuran [azathioprine] Other (See Comments) 01/19/2018   Lisinopril Cough 03/15/2013    Family History  Problem Relation Age of Onset   Breast cancer Mother    Other Father        Gunshot wound   Diabetes  Maternal Grandfather    Heart disease Maternal Grandfather    Colon cancer Neg Hx    Esophageal cancer Neg Hx    Stomach cancer Neg Hx     Social History   Socioeconomic History   Marital status: Married    Spouse name: Not on file   Number of children: 2   Years of education: 12   Highest education level: Not on file  Occupational History   Occupation: Retired  Tobacco Use   Smoking status: Former   Smokeless tobacco: Never   Tobacco comments:    Quit 25 yrs ago  Psychologist, educational Use   Vaping status: Never Used  Substance and Sexual Activity   Alcohol use: No    Comment: Quit 25 yrs ago   Drug use: No   Sexual activity: Not on file  Other Topics Concern   Not on file  Social History Narrative   Lives at home w/ his wife   Right-handed   Caffeine: 3-6 cups of coffee per day   Social Determinants of Health   Financial Resource Strain: Low Risk  (11/27/2022)   Received from  [facility]   Overall Financial Resource Strain (CARDIA)    Difficulty of Paying Living Expenses: Not hard at all  Food Insecurity: No Food Insecurity (01/15/2023)   Hunger Vital Sign    Worried About Running Out of Food in the Last Year: Never true    Ran Out of Food in the Last Year: Never true  Transportation Needs: No Transportation Needs (01/15/2023)   PRAPARE - Administrator, Civil Service (Medical): No    Lack of Transportation (Non-Medical): No  Physical Activity: Sufficiently Active (11/27/2022)   Received from [facility], [facility]   Exercise Vital Sign    Days of Exercise per Week: 7 days    Minutes of Exercise per Session: 30 min  Stress: No Stress Concern Present (11/27/2022)   Received from [facility], [facility] of Occupational Health - Occupational Stress Questionnaire    Feeling of Stress : Only a little  Social Connections: Socially Integrated (11/27/2022)   Received from [facility], [facility]   Social  Connection and Isolation Panel [NHANES]    Frequency of Communication with Friends and Family: More than three times a week    Frequency of Social Gatherings with Friends and Family: More than three times a week    Attends Religious Services: More than 4 times per year    Active Member of [employer] or Organizations: Yes    Attends Engineer, structural: More than 4 times per year    Marital Status: Married  Catering manager Violence: Not At Risk (01/15/2023)   Humiliation, Afraid, Rape, and Kick questionnaire    Fear of Current or Ex-Partner: No    Emotionally Abused: No    Physically Abused: No    Sexually Abused: No    Physical Exam: Vital signs in last 24 hours: @BP  137/72   Pulse 80   Temp 97.7 F (36.5 C) (Temporal)   Resp 15   Ht 5\' 11"  (1.803 m)   Wt 82.6 kg   SpO2 99%   BMI 25.38 kg/m  GEN: NAD EYE: Sclerae anicteric ENT: MMM CV: Non-tachycardic Pulm: CTA b/l GI: Soft, NT/ND NEURO:  Alert & Oriented x 3   Doristine Locks, DO [facility]   02/20/2023 9:01 AM

## 2023-02-20 NOTE — Op Note (Signed)
[facility] Patient Name: Bob Warren Procedure Date : 02/20/2023 MRN: [id number] Attending MD: Doristine Locks , MD, [id number] Date of Birth: 1946/01/24 CSN: [id number] Age: 77 Admit Type: Inpatient Procedure:                Upper GI endoscopy Indications:              Iron deficiency anemia secondary to chronic blood                            loss, Follow-up of esophageal varices, Nodular                            GAVE, Portal hypertensive gastropathy Providers:                Doristine Locks, MD, Geralyn Corwin, RN, Rozetta Nunnery, Technician Referring MD:              Medicines:                Monitored Anesthesia Care Complications:            No immediate complications. Estimated Blood Loss:     Estimated blood loss: none. Procedure:                Pre-Anesthesia Assessment:                           - Prior to the procedure, a History and Physical                            was performed, and patient medications and                            allergies were reviewed. The patient's tolerance of                            previous anesthesia was also reviewed. The risks                            and benefits of the procedure and the sedation                            options and risks were discussed with the patient.                            All questions were answered, and informed consent                            was obtained. Prior Anticoagulants: The patient has                            taken no anticoagulant or antiplatelet agents. ASA  Grade Assessment: III - A patient with severe                            systemic disease. After reviewing the risks and                            benefits, the patient was deemed in satisfactory                            condition to undergo the procedure.                           After obtaining informed consent, the endoscope was                             passed under direct vision. Throughout the                            procedure, the patient's blood pressure, pulse, and                            oxygen saturations were monitored continuously. The                            [id number]-H190 ([id number]) Olympus endoscope was introduced                            through the mouth, and advanced to the second part                            of duodenum. The upper GI endoscopy was                            accomplished without difficulty. The patient                            tolerated the procedure well. Scope In: Scope Out: Findings:      Grade II varices were found in the lower third of the esophagus. They       were small in size.      There were esophageal mucosal changes consistent with short-segment       Barrett's esophagus present in the lower third of the esophagus.      Moderate nodular gastric antral vascular ectasia with pockets of active       oozing was present in the gastric antrum. Six bands were successfully       placed. There was no bleeding at the end of the procedure.      Moderate portal hypertensive gastropathy was found in the cardia, in the       gastric fundus, in the gastric body and in the gastric antrum. The       mucosa was friable with contact oozing with the endoscope.      The examined duodenum was normal. Impression:               - Grade II esophageal varices.                           -  Esophageal mucosal changes consistent with                            short-segment Barrett's esophagus.                           - Nodular GAVE treated with 6 bands.                           - Portal hypertensive gastropathy.                           - Normal examined duodenum.                           - No specimens collected. Recommendation:           - Patient has a contact number available for                            emergencies. The signs and symptoms of potential                            delayed  complications were discussed with the                            patient. Return to normal activities tomorrow.                            Written discharge instructions were provided to the                            patient.                           - Soft diet today then advance as tolerated                            tomorrow.                           - Continue present medications.                           - Repeat CBC check in 7-10 days.                           - Return to GI clinic at appointment to be                            scheduled. Procedure Code(s):        --- Professional ---                           [phone], Esophagogastroduodenoscopy, flexible,                            transoral; with control of  bleeding, any method Diagnosis Code(s):        --- Professional ---                           I85.00, Esophageal varices without bleeding                           K22.89, Other specified disease of esophagus                           K31.811, Angiodysplasia of stomach and duodenum                            with bleeding                           K76.6, Portal hypertension                           K31.89, Other diseases of stomach and duodenum                           D50.0, Iron deficiency anemia secondary to blood                            loss (chronic) CPT copyright 2022 [employer]. All rights reserved. The codes documented in this report are preliminary and upon coder review may  be revised to meet current compliance requirements. Doristine Locks, MD 02/20/2023 10:03:10 AM Number of Addenda: 0

## 2023-02-21 ENCOUNTER — Encounter (HOSPITAL_COMMUNITY): Payer: Self-pay | Admitting: Gastroenterology

## 2023-02-21 NOTE — Anesthesia Postprocedure Evaluation (Signed)
Anesthesia Post Note  Patient: Bob Warren  Procedure(s) Performed: ESOPHAGOGASTRODUODENOSCOPY (EGD) WITH PROPOFOL GASTRIC VARICES BANDING     Patient location during evaluation: Endoscopy Anesthesia Type: MAC Level of consciousness: awake and alert Pain management: pain level controlled Vital Signs Assessment: post-procedure vital signs reviewed and stable Respiratory status: spontaneous breathing, nonlabored ventilation, respiratory function stable and patient connected to nasal cannula oxygen Cardiovascular status: stable and blood pressure returned to baseline Postop Assessment: no apparent nausea or vomiting Anesthetic complications: no   No notable events documented.  Last Vitals:  Vitals:   02/20/23 1004 02/20/23 1014  BP: (!) 159/73 (!) 157/75  Pulse: 78 73  Resp: (!) 22 15  Temp:    SpO2: 98% 100%    Last Pain:  Vitals:   02/20/23 1004  TempSrc:   PainSc: 10-Worst pain ever                 Millianna Szymborski S

## 2023-06-02 ENCOUNTER — Telehealth: Payer: Self-pay | Admitting: *Deleted

## 2023-06-02 NOTE — Telephone Encounter (Signed)
Per 02/20/23 endoscopy report done by Dr Barron Alvine, patient needs GI office follow up and CBC.   I have attempted to reach patient but there is no answer, voicemail full. Will attempt to reach patient again at a later time.

## 2023-06-03 NOTE — Telephone Encounter (Signed)
I have again attempted to contact patient via phone number provided in EPIC. No answer, voicemail full. I will send a mychart message to patient as it appears he is an active user.

## 2023-12-27 ENCOUNTER — Emergency Department (HOSPITAL_COMMUNITY)

## 2023-12-27 ENCOUNTER — Inpatient Hospital Stay (HOSPITAL_COMMUNITY)
Admission: EM | Admit: 2023-12-27 | Discharge: 2024-01-02 | DRG: 432 | Disposition: A | Discharge status: Home Health | Attending: Internal Medicine | Admitting: Internal Medicine

## 2023-12-27 DIAGNOSIS — G7000 Myasthenia gravis without (acute) exacerbation: Secondary | ICD-10-CM | POA: Diagnosis present

## 2023-12-27 DIAGNOSIS — I10 Essential (primary) hypertension: Secondary | ICD-10-CM | POA: Diagnosis present

## 2023-12-27 DIAGNOSIS — Z87442 Personal history of urinary calculi: Secondary | ICD-10-CM

## 2023-12-27 DIAGNOSIS — N182 Chronic kidney disease, stage 2 (mild): Secondary | ICD-10-CM | POA: Diagnosis present

## 2023-12-27 DIAGNOSIS — E871 Hypo-osmolality and hyponatremia: Secondary | ICD-10-CM | POA: Diagnosis present

## 2023-12-27 DIAGNOSIS — Z794 Long term (current) use of insulin: Secondary | ICD-10-CM

## 2023-12-27 DIAGNOSIS — Z66 Do not resuscitate: Secondary | ICD-10-CM | POA: Diagnosis present

## 2023-12-27 DIAGNOSIS — Z85828 Personal history of other malignant neoplasm of skin: Secondary | ICD-10-CM

## 2023-12-27 DIAGNOSIS — R0602 Shortness of breath: Secondary | ICD-10-CM | POA: Diagnosis not present

## 2023-12-27 DIAGNOSIS — K7581 Nonalcoholic steatohepatitis (NASH): Secondary | ICD-10-CM | POA: Diagnosis present

## 2023-12-27 DIAGNOSIS — D638 Anemia in other chronic diseases classified elsewhere: Secondary | ICD-10-CM | POA: Diagnosis present

## 2023-12-27 DIAGNOSIS — Z8249 Family history of ischemic heart disease and other diseases of the circulatory system: Secondary | ICD-10-CM

## 2023-12-27 DIAGNOSIS — Z87891 Personal history of nicotine dependence: Secondary | ICD-10-CM

## 2023-12-27 DIAGNOSIS — K7469 Other cirrhosis of liver: Principal | ICD-10-CM | POA: Diagnosis present

## 2023-12-27 DIAGNOSIS — E119 Type 2 diabetes mellitus without complications: Secondary | ICD-10-CM

## 2023-12-27 DIAGNOSIS — E872 Acidosis, unspecified: Secondary | ICD-10-CM | POA: Diagnosis present

## 2023-12-27 DIAGNOSIS — D61818 Other pancytopenia: Secondary | ICD-10-CM | POA: Diagnosis present

## 2023-12-27 DIAGNOSIS — D696 Thrombocytopenia, unspecified: Secondary | ICD-10-CM | POA: Insufficient documentation

## 2023-12-27 DIAGNOSIS — E1122 Type 2 diabetes mellitus with diabetic chronic kidney disease: Secondary | ICD-10-CM | POA: Diagnosis present

## 2023-12-27 DIAGNOSIS — Z792 Long term (current) use of antibiotics: Secondary | ICD-10-CM

## 2023-12-27 DIAGNOSIS — D500 Iron deficiency anemia secondary to blood loss (chronic): Secondary | ICD-10-CM

## 2023-12-27 DIAGNOSIS — K766 Portal hypertension: Secondary | ICD-10-CM | POA: Diagnosis present

## 2023-12-27 DIAGNOSIS — K7460 Unspecified cirrhosis of liver: Secondary | ICD-10-CM | POA: Insufficient documentation

## 2023-12-27 DIAGNOSIS — J90 Pleural effusion, not elsewhere classified: Secondary | ICD-10-CM

## 2023-12-27 DIAGNOSIS — I5033 Acute on chronic diastolic (congestive) heart failure: Secondary | ICD-10-CM | POA: Diagnosis present

## 2023-12-27 DIAGNOSIS — N179 Acute kidney failure, unspecified: Principal | ICD-10-CM

## 2023-12-27 DIAGNOSIS — I480 Paroxysmal atrial fibrillation: Secondary | ICD-10-CM | POA: Diagnosis present

## 2023-12-27 DIAGNOSIS — Z833 Family history of diabetes mellitus: Secondary | ICD-10-CM

## 2023-12-27 DIAGNOSIS — I130 Hypertensive heart and chronic kidney disease with heart failure and stage 1 through stage 4 chronic kidney disease, or unspecified chronic kidney disease: Principal | ICD-10-CM | POA: Diagnosis present

## 2023-12-27 DIAGNOSIS — Z888 Allergy status to other drugs, medicaments and biological substances status: Secondary | ICD-10-CM

## 2023-12-27 DIAGNOSIS — E1165 Type 2 diabetes mellitus with hyperglycemia: Secondary | ICD-10-CM | POA: Diagnosis present

## 2023-12-27 DIAGNOSIS — E669 Obesity, unspecified: Secondary | ICD-10-CM

## 2023-12-27 DIAGNOSIS — D509 Iron deficiency anemia, unspecified: Secondary | ICD-10-CM | POA: Diagnosis present

## 2023-12-27 DIAGNOSIS — Z7984 Long term (current) use of oral hypoglycemic drugs: Secondary | ICD-10-CM

## 2023-12-27 DIAGNOSIS — E7800 Pure hypercholesterolemia, unspecified: Secondary | ICD-10-CM | POA: Diagnosis present

## 2023-12-27 DIAGNOSIS — E875 Hyperkalemia: Secondary | ICD-10-CM | POA: Diagnosis present

## 2023-12-27 DIAGNOSIS — E785 Hyperlipidemia, unspecified: Secondary | ICD-10-CM | POA: Diagnosis present

## 2023-12-27 DIAGNOSIS — Z79899 Other long term (current) drug therapy: Secondary | ICD-10-CM

## 2023-12-27 DIAGNOSIS — R188 Other ascites: Secondary | ICD-10-CM | POA: Insufficient documentation

## 2023-12-27 LAB — CBC WITH DIFFERENTIAL/PLATELET
Abs Immature Granulocytes: 0.03 10*3/uL (ref 0.00–0.07)
Basophils Absolute: 0 10*3/uL (ref 0.0–0.1)
Basophils Relative: 0 %
Eosinophils Absolute: 0.1 10*3/uL (ref 0.0–0.5)
Eosinophils Relative: 1 %
HCT: 34.3 % — ABNORMAL LOW (ref 39.0–52.0)
Hemoglobin: 10.8 g/dL — ABNORMAL LOW (ref 13.0–17.0)
Immature Granulocytes: 0 %
Lymphocytes Relative: 10 %
Lymphs Abs: 0.9 10*3/uL (ref 0.7–4.0)
MCH: 27.8 pg (ref 26.0–34.0)
MCHC: 31.5 g/dL (ref 30.0–36.0)
MCV: 88.2 fL (ref 80.0–100.0)
Monocytes Absolute: 0.7 10*3/uL (ref 0.1–1.0)
Monocytes Relative: 8 %
Neutro Abs: 7 10*3/uL (ref 1.7–7.7)
Neutrophils Relative %: 81 %
Platelets: 214 10*3/uL (ref 150–400)
RBC: 3.89 MIL/uL — ABNORMAL LOW (ref 4.22–5.81)
RDW: 15.5 % (ref 11.5–15.5)
WBC: 8.7 10*3/uL (ref 4.0–10.5)
nRBC: 0 % (ref 0.0–0.2)

## 2023-12-27 LAB — BASIC METABOLIC PANEL WITH GFR
Anion gap: 15 (ref 5–15)
BUN: 58 mg/dL — ABNORMAL HIGH (ref 8–23)
CO2: 16 mmol/L — ABNORMAL LOW (ref 22–32)
Calcium: 8.8 mg/dL — ABNORMAL LOW (ref 8.9–10.3)
Chloride: 100 mmol/L (ref 98–111)
Creatinine, Ser: 2.34 mg/dL — ABNORMAL HIGH (ref 0.61–1.24)
GFR, Estimated: 28 mL/min — ABNORMAL LOW (ref >60)
Glucose, Bld: 189 mg/dL — ABNORMAL HIGH (ref 70–99)
Potassium: 5.8 mmol/L — ABNORMAL HIGH (ref 3.5–5.1)
Sodium: 131 mmol/L — ABNORMAL LOW (ref 135–145)

## 2023-12-27 LAB — BRAIN NATRIURETIC PEPTIDE: B Natriuretic Peptide: 97.3 pg/mL (ref 0.0–100.0)

## 2023-12-27 MED ORDER — ENOXAPARIN SODIUM 30 MG/0.3ML IJ SOSY
30.0000 mg | PREFILLED_SYRINGE | Freq: Every day | INTRAMUSCULAR | Status: DC
Start: 2023-12-28 — End: 2023-12-29
  Administered 2023-12-28 – 2023-12-29 (×2): 30 mg via SUBCUTANEOUS
  Filled 2023-12-27 (×2): qty 0.3

## 2023-12-27 MED ORDER — IPRATROPIUM-ALBUTEROL 0.5-2.5 (3) MG/3ML IN SOLN
3.0000 mL | Freq: Four times a day (QID) | RESPIRATORY_TRACT | Status: DC
  Administered 2023-12-28 – 2023-12-29 (×6): 3 mL via RESPIRATORY_TRACT
  Filled 2023-12-27 (×6): qty 3

## 2023-12-27 MED ORDER — ACETAMINOPHEN 325 MG PO TABS
650.0000 mg | ORAL_TABLET | Freq: Four times a day (QID) | ORAL | Status: DC | PRN
  Administered 2023-12-28 – 2024-01-01 (×5): 650 mg via ORAL
  Filled 2023-12-27 (×5): qty 2

## 2023-12-27 MED ORDER — PROCHLORPERAZINE EDISYLATE 10 MG/2ML IJ SOLN: 5.0000 mg | Freq: Four times a day (QID) | INTRAMUSCULAR | Status: DC | PRN

## 2023-12-27 MED ORDER — MELATONIN 5 MG PO TABS
5.0000 mg | ORAL_TABLET | Freq: Every evening | ORAL | Status: DC | PRN
  Administered 2023-12-29 – 2023-12-31 (×2): 5 mg via ORAL
  Filled 2023-12-27 (×2): qty 1

## 2023-12-27 MED ORDER — FUROSEMIDE 10 MG/ML IJ SOLN
100.0000 mg | Freq: Once | INTRAVENOUS | Status: AC
  Administered 2023-12-27: 100 mg via INTRAVENOUS
  Filled 2023-12-27: qty 10

## 2023-12-27 MED ORDER — NITROGLYCERIN 2 % TD OINT
1.0000 [in_us] | TOPICAL_OINTMENT | Freq: Once | TRANSDERMAL | Status: AC
  Administered 2023-12-27: 1 [in_us] via TOPICAL
  Filled 2023-12-27: qty 1

## 2023-12-27 MED ORDER — POLYETHYLENE GLYCOL 3350 17 G PO PACK: 17.0000 g | PACK | Freq: Every day | ORAL | Status: DC | PRN

## 2023-12-27 NOTE — ED Triage Notes (Signed)
 Pt BIB EMS from home. C/o SOB and ble edema. Pt has not been compliant with lasix .  Also being treated for cellulitis.   EMS applied 1.5 inches nitroglycerin paste on chest 140/53, cbg 240, 98% RA, 2L [place] for comfort, HR 90's, RR 30's

## 2023-12-27 NOTE — H&P (Signed)
 History and Physical  Bob Warren WUJ:[id number] DOB: [birth year] DOA: 12/27/2023  Referring physician: Victoriano Grate  PCP: Darylene Epley, MD  Outpatient Specialists: GI. Patient coming from: Home.  Chief Complaint: Shortness of breath.  HPI: Bob Warren is a 78 y.o. male with medical history significant for chronic HFpEF, myasthenia gravis, paroxysmal A-fib not anticoagulated due to history of GI bleed, GAVE, chronic bilateral lower extremity edema, hypertension, hyperlipidemia, GERD, type 2 diabetes, who presents to the ER with complaints of shortness of breath and worsening bilateral lower extremity edema.  Endorses compliance with his home medications including his home Lasix .  In the ER edematous, tachypneic with use of assessor muscles to breathe.  The patient received a dose of IV Lasix  100 mg x 1 as well as Nitro ointment.  Lab studies was notable for elevated creatinine above baseline.  Chest x-ray was equivocal.  TRH, hospitalist service, was asked to admit.  ED Course: Temperature 97.8.  BP 134/69, pulse 92, respiration rate 27, O2 saturation 94% on room air.  Lab studies notable for serum sodium 131, potassium 5.8, serum bicarb 16, glucose 189, BUN 58, creatinine 2.34, GFR 28, BNP 97.  WBC 8.7.  Hemoglobin 10.8, MCV 88, platelet count 214.  Review of Systems: Review of systems as noted in the HPI. All other systems reviewed and are negative.   Past Medical History:  Diagnosis Date   Arthritis    Diabetes (HCC)    High cholesterol    Hypertension    Kidney stone    Myasthenia gravis [facility])    Past Surgical History:  Procedure Laterality Date   COLONOSCOPY  01/09/2021   Dr.Raghid Ever [facility]. Poor prep.   ESOPHAGOGASTRODUODENOSCOPY N/A 01/14/2018   Procedure: ESOPHAGOGASTRODUODENOSCOPY (EGD);  Surgeon: Irby Mannan, MD;  Location: [facility] ENDOSCOPY;  Service: Endoscopy;  Laterality: N/A;   ESOPHAGOGASTRODUODENOSCOPY  01/09/2021   Dr.Raghid Sherol [place], [facility]. Hiatal Hernia. Findings concerning for nodular Barrett's disease. Biopsied. 2cm submucosal gastric body mass. Reosive gastritis, Biopsied.   ESOPHAGOGASTRODUODENOSCOPY N/A 01/16/2023   Procedure: ESOPHAGOGASTRODUODENOSCOPY (EGD);  Surgeon: Daina Drum, MD;  Location: [facility] ENDOSCOPY;  Service: Gastroenterology;  Laterality: N/A;   ESOPHAGOGASTRODUODENOSCOPY (EGD) WITH PROPOFOL  N/A 06/10/2022   Procedure: ESOPHAGOGASTRODUODENOSCOPY (EGD) WITH PROPOFOL ;  Surgeon: Annis Kinder, DO;  Location: [facility] ENDOSCOPY;  Service: Gastroenterology;  Laterality: N/A;   ESOPHAGOGASTRODUODENOSCOPY (EGD) WITH PROPOFOL  N/A 02/20/2023   Procedure: ESOPHAGOGASTRODUODENOSCOPY (EGD) WITH PROPOFOL ;  Surgeon: Annis Kinder, DO;  Location: [facility] ENDOSCOPY;  Service: Gastroenterology;  Laterality: N/A;   GASTRIC VARICES BANDING  01/16/2023   Procedure: GASTRIC GAVE BANDING;  Surgeon: Daina Drum, MD;  Location: [facility] ENDOSCOPY;  Service: Gastroenterology;;   GASTRIC VARICES BANDING  02/20/2023   Procedure: GASTRIC VARICES BANDING;  Surgeon: Annis Kinder, DO;  Location: [facility] ENDOSCOPY;  Service: Gastroenterology;;   HOT HEMOSTASIS N/A 06/10/2022   Procedure: HOT HEMOSTASIS (ARGON PLASMA COAGULATION/BICAP);  Surgeon: Annis Kinder, DO;  Location: [facility] ENDOSCOPY;  Service: Gastroenterology;  Laterality: N/A;   SKIN CANCER EXCISION Right 2016   Arm    Social History:  reports that he has quit smoking. He has never used smokeless tobacco. He reports that he does not drink alcohol and does not use drugs.   Allergies  Allergen Reactions   Imuran  [Azathioprine ] Other (See Comments)    Abnormal LIver functional    Lisinopril Cough    Family History  Problem Relation Age of Onset   Breast cancer Mother    Other Father  Gunshot wound   Diabetes Maternal Grandfather    Heart disease Maternal Grandfather    Colon cancer Neg Hx    Esophageal cancer Neg Hx    Stomach cancer Neg Hx       Prior to  Admission medications   Medication Sig Start Date End Date Taking? Authorizing Provider  atorvastatin  (LIPITOR) 20 MG tablet Take 20 mg by mouth in the morning. 01/03/20   [provider]  cephALEXin (KEFLEX) 500 MG capsule Take 500 mg by mouth in the morning, at noon, and at bedtime.    [provider]  cyanocobalamin (VITAMIN B12) 1000 MCG tablet Take 1,000 mcg by mouth in the morning.    [provider]  diltiazem  (CARDIZEM  CD) 120 MG 24 hr capsule Take 1 capsule (120 mg total) by mouth daily. 02/04/18   Abbe Abate, MD  ferrous sulfate 325 (65 FE) MG EC tablet Take 325 mg by mouth in the morning.    [provider]  furosemide  (LASIX ) 40 MG tablet Take 40 mg by mouth in the morning.    [provider]  insulin  aspart (NOVOLOG ) 100 UNIT/ML injection Inject 4-8 Units into the skin 3 (three) times daily before meals. USE PER SLIDING SCALE, MAX 60 UNITS PER DAY (HOLD FOR BG LESS THAN 200) 01/28/23 01/28/24  [provider]  insulin  glargine (LANTUS ) 100 UNIT/ML injection Inject 0.14 mLs (14 Units total) into the skin at bedtime. 02/03/18   Abbe Abate, MD  losartan  (COZAAR ) 25 MG tablet Take 25 mg by mouth in the morning.    [provider]  metFORMIN (GLUCOPHAGE) 850 MG tablet Take 850 mg by mouth 2 (two) times daily with a meal.    [provider]  Omega-3 Fatty Acids (FISH OIL PO) Take 1 capsule by mouth in the morning.    [provider]  oxybutynin  (DITROPAN  XL) 15 MG 24 hr tablet Take 15 mg by mouth in the morning. 10/22/21   [provider]  pantoprazole  (PROTONIX ) 40 MG tablet Take 1 tablet (40 mg total) by mouth 2 (two) times daily. 01/17/23   Ozell Blunt, MD  potassium chloride  SA (KLOR-CON  M) 20 MEQ tablet Take 40 mEq by mouth in the morning.    [provider]  solifenacin (VESICARE) 5 MG tablet Take 5 mg by mouth in the morning.    [provider]  sucralfate (CARAFATE) 1 g  tablet Take 1 g by mouth in the morning and at bedtime.    [provider]  traMADol  (ULTRAM ) 50 MG tablet Take 1 tablet (50 mg total) by mouth every 6 (six) hours as needed for moderate pain. Patient taking differently: Take 50 mg by mouth 2 (two) times daily as needed (PAIN.). 02/03/18   Abbe Abate, MD    Physical Exam: BP (!) 142/71   Pulse 95   Temp 97.8 F (36.6 C) (Oral)   Resp (!) 26   SpO2 98%   General: 78 y.o. year-old male well developed well nourished in no acute distress.  Alert and oriented x3. Cardiovascular: Regular rate and rhythm with no rubs or gallops.  No thyromegaly or JVD noted.  No lower extremity edema. 2/4 pulses in all 4 extremities. Respiratory: Mild rales at bases.  Poor inspiratory effort. Abdomen: Soft nontender nondistended with normal bowel sounds x4 quadrants. Muskuloskeletal: No cyanosis, clubbing or edema noted bilaterally Neuro: CN II-XII intact, strength, sensation, reflexes Skin: No ulcerative lesions noted or rashes Psychiatry: Judgement  and insight appear normal. Mood is appropriate for condition and setting          Labs on Admission:  Basic Metabolic Panel: Recent Labs  Lab 12/27/23 2131  NA 131*  K 5.8*  CL 100  CO2 16*  GLUCOSE 189*  BUN 58*  CREATININE 2.34*  CALCIUM  8.8*   Liver Function Tests: No results for input(s): "AST", "ALT", "ALKPHOS", "BILITOT", "PROT", "ALBUMIN " in the last 168 hours. No results for input(s): "LIPASE", "AMYLASE" in the last 168 hours. No results for input(s): "AMMONIA" in the last 168 hours. CBC: Recent Labs  Lab 12/27/23 2131  WBC 8.7  NEUTROABS 7.0  HGB 10.8*  HCT 34.3*  MCV 88.2  PLT 214   Cardiac Enzymes: No results for input(s): "CKTOTAL", "CKMB", "CKMBINDEX", "TROPONINI" in the last 168 hours.  BNP (last 3 results) Recent Labs    12/27/23 2131  BNP 97.3    ProBNP (last 3 results) No results for input(s): "PROBNP" in the last 8760 hours.  CBG: No results for  input(s): "GLUCAP" in the last 168 hours.  Radiological Exams on Admission: DG Chest 2 View Result Date: 12/27/2023 CLINICAL DATA:  Shortness of breath EXAM: CHEST - 2 VIEW COMPARISON:  01/26/2018 FINDINGS: Shallow inspiration with atelectasis in the lung bases. Heart size and pulmonary vascularity are normal for technique. No focal consolidation or airspace disease in the lungs. No pleural effusions or pneumothorax. Mediastinal contours appear intact. Calcification of the aorta. IMPRESSION: Shallow inspiration with linear atelectasis in the lung bases. No focal consolidation. Electronically Signed   By: Boyce Byes M.D.   On: 12/27/2023 22:08    EKG: I independently viewed the EKG done and my findings are as followed: Sinus rhythm rate of 99.  Nonspecific ST-T changes.  QTc 533.  Assessment/Plan Present on Admission:  Shortness of breath  Principal Problem:   Shortness of breath  Shortness of breath, possibly multifactorial. Not hypoxic Chest x-ray cortical Continue bronchodilator nebulizers Incentive spirometer Early ambulation as tolerated  AKI, prerenal in the setting of poor oral intake Presented with BUN 58, creatinine 2.34 with normal baseline Avoid nephrotoxic agents and hypotension. Monitor urine output Repeat BMP in the morning.  Hyperkalemia in the setting of acute renal insufficiency Presented with potassium of 5.8 No evidence of peaked T waves on twelve-lead EKG The patient received a dose of IV Lasix  in the ER 100 mg x 1 Repeat BMP in the morning  Euvolemic hyponatremia Presented with serum sodium of 131 History of IV fluid for now.  Hyperglycemia Presented with serum glucose of 189 As hemoglobin A1c 6.8 on 01/15/2023 Follow-up repeat hemoglobin A1c Heart healthy carb modified diet Insulin  sliding scale  Non anion gap metabolic acidosis in the setting of acute renal insufficiency Serum bicarb 16 and anion gap of 15 Follow VBG to assess pH  Anemia of  chronic disease Hemoglobin stable at 10.8  Generalized weakness PT OT assessment Fall precautions.   Time: 75 minutes.   DVT prophylaxis: Subcu Lovenox  daily.  Code Status: DNR/DNI.  Family Communication: None at bedside.  Disposition Plan: Admitted to telemetry cardiac unit.  Consults called: None.  Admission status: Observation status.   Status is: Observation    Bary Boss MD [facility] Pager [phone]  If 7PM-7AM, please contact night-coverage [email or web address] Password [email or web address]  12/27/2023, 10:51 PM

## 2023-12-27 NOTE — ED Provider Notes (Signed)
  [place] EMERGENCY DEPARTMENT AT [facility] Provider Note   Procedures Ultrasound ED Thoracic  Date/Time: 12/27/2023 10:34 PM  Performed by: Nathanael Baker, DO Authorized by: Nathanael Baker, DO   Procedure details:    Indications: dyspnea     Assessment for:  Pleural effusion   Left lung pleural:  Visualized   Right lung pleural:  Visualized   Images: archived   Findings:    A-lines noted throughout: not identified     B-lines noted throughout: not identified   Right Lung Findings:     right lung pleural effusion      Left Lung Findings:     left lung pleural effusion Impression:    Impression: fluid in thorax   Comments:     Bilateral pleural effusions      Afton Horse T, DO 12/27/23 2235

## 2023-12-27 NOTE — ED Provider Notes (Signed)
 [place] EMERGENCY DEPARTMENT AT [facility] Provider Note   CSN: [id number] Arrival date & time: 12/27/23  2107     History  No chief complaint on file.   Bob Warren is a 78 y.o. male has medical history significant for diabetes, hypertension, and cellulitis presents today for shortness of breath and bilateral lower extremity edema.  Patient states that he missed his dose of Lasix  yesterday.  Patient is also currently be treated for cellulitis.  Patient denies fever, chills, numbness, chest pain, abdominal pain, or any other complaints at this time.  HPI     Home Medications Prior to Admission medications   Medication Sig Start Date End Date Taking? Authorizing Provider  atorvastatin  (LIPITOR) 20 MG tablet Take 20 mg by mouth in the morning. 01/03/20   [provider]  cephALEXin (KEFLEX) 500 MG capsule Take 500 mg by mouth in the morning, at noon, and at bedtime.    [provider]  cyanocobalamin (VITAMIN B12) 1000 MCG tablet Take 1,000 mcg by mouth in the morning.    [provider]  diltiazem  (CARDIZEM  CD) 120 MG 24 hr capsule Take 1 capsule (120 mg total) by mouth daily. 02/04/18   Abbe Abate, MD  ferrous sulfate  325 (65 FE) MG EC tablet Take 325 mg by mouth in the morning.    [provider]  furosemide  (LASIX ) 40 MG tablet Take 40 mg by mouth in the morning.    [provider]  insulin  aspart (NOVOLOG ) 100 UNIT/ML injection Inject 4-8 Units into the skin 3 (three) times daily before meals. USE PER SLIDING SCALE, MAX 60 UNITS PER DAY (HOLD FOR BG LESS THAN 200) 01/28/23 01/28/24  [provider]  insulin  glargine (LANTUS ) 100 UNIT/ML injection Inject 0.14 mLs (14 Units total) into the skin at bedtime. 02/03/18   Abbe Abate, MD  losartan  (COZAAR ) 25 MG tablet Take 25 mg by mouth in the morning.    [provider]  metFORMIN (GLUCOPHAGE) 850 MG tablet Take 850 mg by mouth 2 (two) times daily with  a meal.    [provider]  Omega-3 Fatty Acids (FISH OIL PO) Take 1 capsule by mouth in the morning.    [provider]  oxybutynin  (DITROPAN  XL) 15 MG 24 hr tablet Take 15 mg by mouth in the morning. 10/22/21   [provider]  pantoprazole  (PROTONIX ) 40 MG tablet Take 1 tablet (40 mg total) by mouth 2 (two) times daily. 01/17/23   Ozell Blunt, MD  potassium chloride  SA (KLOR-CON  M) 20 MEQ tablet Take 40 mEq by mouth in the morning.    [provider]  solifenacin (VESICARE) 5 MG tablet Take 5 mg by mouth in the morning.    [provider]  sucralfate (CARAFATE) 1 g tablet Take 1 g by mouth in the morning and at bedtime.    [provider]  traMADol  (ULTRAM ) 50 MG tablet Take 1 tablet (50 mg total) by mouth every 6 (six) hours as needed for moderate pain. Patient taking differently: Take 50 mg by mouth 2 (two) times daily as needed (PAIN.). 02/03/18   Abbe Abate, MD      Allergies    Imuran  [azathioprine ] and Lisinopril    Review of Systems   Review of Systems  Respiratory:  Positive for shortness of breath.     Physical Exam Updated Vital Signs BP (!) 142/71   Pulse 95   Temp 97.8 F (36.6 C) (  Oral)   Resp (!) 26   SpO2 98%  Physical Exam Vitals and nursing note reviewed.  Constitutional:      General: He is not in acute distress.    Appearance: He is well-developed.     Comments: Uncomfortable appearing  HENT:     Head: Normocephalic and atraumatic.     Nose: Nose normal.     Mouth/Throat:     Mouth: Mucous membranes are moist.     Pharynx: Oropharynx is clear.  Eyes:     Extraocular Movements: Extraocular movements intact.     Conjunctiva/sclera: Conjunctivae normal.  Cardiovascular:     Rate and Rhythm: Normal rate and regular rhythm.     Pulses: Normal pulses.     Heart sounds: Normal heart sounds. No murmur heard. Pulmonary:     Effort: Tachypnea and accessory muscle usage present. No respiratory  distress.     Breath sounds: Normal breath sounds.  Abdominal:     Palpations: Abdomen is soft.     Tenderness: There is no abdominal tenderness.  Musculoskeletal:        General: No swelling.     Cervical back: Neck supple.     Right lower leg: Edema present.     Left lower leg: Edema present.  Skin:    General: Skin is warm and dry.     Capillary Refill: Capillary refill takes less than 2 seconds.  Neurological:     General: No focal deficit present.     Mental Status: He is alert and oriented to person, place, and time.  Psychiatric:        Mood and Affect: Mood normal.     ED Results / Procedures / Treatments   Labs (all labs ordered are listed, but only abnormal results are displayed) Labs Reviewed  BASIC METABOLIC PANEL WITH GFR - Abnormal; Notable for the following components:      Result Value   Sodium 131 (*)    Potassium 5.8 (*)    CO2 16 (*)    Glucose, Bld 189 (*)    BUN 58 (*)    Creatinine, Ser 2.34 (*)    Calcium  8.8 (*)    GFR, Estimated 28 (*)    All other components within normal limits  CBC WITH DIFFERENTIAL/PLATELET - Abnormal; Notable for the following components:   RBC 3.89 (*)    Hemoglobin 10.8 (*)    HCT 34.3 (*)    All other components within normal limits  BRAIN NATRIURETIC PEPTIDE    EKG None  Radiology DG Chest 2 View Result Date: 12/27/2023 CLINICAL DATA:  Shortness of breath EXAM: CHEST - 2 VIEW COMPARISON:  01/26/2018 FINDINGS: Shallow inspiration with atelectasis in the lung bases. Heart size and pulmonary vascularity are normal for technique. No focal consolidation or airspace disease in the lungs. No pleural effusions or pneumothorax. Mediastinal contours appear intact. Calcification of the aorta. IMPRESSION: Shallow inspiration with linear atelectasis in the lung bases. No focal consolidation. Electronically Signed   By: Boyce Byes M.D.   On: 12/27/2023 22:08    Procedures Procedures    Medications Ordered in  ED Medications  furosemide  (LASIX ) 100 mg in dextrose  5 % 50 mL IVPB (100 mg Intravenous New Bag/Given 12/27/23 2147)  nitroGLYCERIN  (NITROGLYN) 2 % ointment 1 inch (1 inch Topical Given 12/27/23 2147)    ED Course/ Medical Decision Making/ A&P  Medical Decision Making Amount and/or Complexity of Data Reviewed Labs: ordered. Radiology: ordered.  Risk Prescription drug management.   This patient presents to the ED for concern of shortness of breath, this involves an extensive number of treatment options, and is a complaint that carries with it a high risk of complications and morbidity.  The differential diagnosis includes asthma exacerbation, COPD exacerbation, CHF exacerbation, pneumonia   Co morbidities / Chronic conditions that complicate the patient evaluation  Diabetes and hypertension   Additional history obtained:  Additional history obtained from EMR External records from outside source obtained and reviewed including Care Everywhere   Lab Tests:  I Ordered, and personally interpreted labs.  The pertinent results include: Mild anemia at 10.8, mild hyponatremia 131, hyperkalemia 5.8, decreased CO2 at 12, elevated bun at 58, elevated creatinine at 2.34, BNP 97.3   Imaging Studies ordered:  I ordered imaging studies including CXR  I independently visualized and interpreted imaging which showed shallow inspiration, no focal consolidation I agree with the radiologist interpretation   Cardiac Monitoring: / EKG:  The patient was maintained on a cardiac monitor.  I personally viewed and interpreted the cardiac monitored which showed an underlying rhythm of: Sinus    Problem List / ED Course / Critical interventions / Medication management  I ordered medication including Nitropaste and Lasix  Reevaluation of the patient after these medicines showed that the patient Patient stayed the same. I have reviewed the patients home medicines and  have made adjustments as needed   Consultations Obtained:  I requested consultation with the Hospitalist, Dr. Del Favia,  and discussed lab and imaging findings as well as pertinent plan - they recommend: admission for bilateral pleural effusions and AKI   Test / Admission - Considered:  Admission for bilateral pleural effusions and AKI        Final Clinical Impression(s) / ED Diagnoses Final diagnoses:  AKI (acute kidney injury) (HCC)  Pleural effusion    Rx / DC Orders ED Discharge Orders     None         Carie Charity, PA-C 12/27/23 2246    Afton Horse T, DO 01/03/24 1451

## 2023-12-28 ENCOUNTER — Encounter (HOSPITAL_COMMUNITY): Payer: Self-pay | Admitting: Internal Medicine

## 2023-12-28 ENCOUNTER — Inpatient Hospital Stay (HOSPITAL_COMMUNITY)

## 2023-12-28 ENCOUNTER — Other Ambulatory Visit: Payer: Self-pay

## 2023-12-28 DIAGNOSIS — E871 Hypo-osmolality and hyponatremia: Secondary | ICD-10-CM | POA: Diagnosis present

## 2023-12-28 DIAGNOSIS — Z66 Do not resuscitate: Secondary | ICD-10-CM | POA: Diagnosis present

## 2023-12-28 DIAGNOSIS — D638 Anemia in other chronic diseases classified elsewhere: Secondary | ICD-10-CM | POA: Diagnosis present

## 2023-12-28 DIAGNOSIS — K766 Portal hypertension: Secondary | ICD-10-CM | POA: Diagnosis present

## 2023-12-28 DIAGNOSIS — E872 Acidosis, unspecified: Secondary | ICD-10-CM | POA: Diagnosis present

## 2023-12-28 DIAGNOSIS — K7581 Nonalcoholic steatohepatitis (NASH): Secondary | ICD-10-CM | POA: Diagnosis present

## 2023-12-28 DIAGNOSIS — Z8249 Family history of ischemic heart disease and other diseases of the circulatory system: Secondary | ICD-10-CM | POA: Diagnosis not present

## 2023-12-28 DIAGNOSIS — R0602 Shortness of breath: Secondary | ICD-10-CM | POA: Diagnosis present

## 2023-12-28 DIAGNOSIS — K7469 Other cirrhosis of liver: Secondary | ICD-10-CM | POA: Diagnosis present

## 2023-12-28 DIAGNOSIS — E1122 Type 2 diabetes mellitus with diabetic chronic kidney disease: Secondary | ICD-10-CM | POA: Diagnosis present

## 2023-12-28 DIAGNOSIS — Z794 Long term (current) use of insulin: Secondary | ICD-10-CM | POA: Diagnosis not present

## 2023-12-28 DIAGNOSIS — E7800 Pure hypercholesterolemia, unspecified: Secondary | ICD-10-CM | POA: Diagnosis present

## 2023-12-28 DIAGNOSIS — G7000 Myasthenia gravis without (acute) exacerbation: Secondary | ICD-10-CM | POA: Diagnosis present

## 2023-12-28 DIAGNOSIS — D509 Iron deficiency anemia, unspecified: Secondary | ICD-10-CM | POA: Diagnosis present

## 2023-12-28 DIAGNOSIS — I5033 Acute on chronic diastolic (congestive) heart failure: Secondary | ICD-10-CM | POA: Diagnosis present

## 2023-12-28 DIAGNOSIS — I5031 Acute diastolic (congestive) heart failure: Secondary | ICD-10-CM | POA: Diagnosis not present

## 2023-12-28 DIAGNOSIS — E875 Hyperkalemia: Secondary | ICD-10-CM | POA: Diagnosis present

## 2023-12-28 DIAGNOSIS — I130 Hypertensive heart and chronic kidney disease with heart failure and stage 1 through stage 4 chronic kidney disease, or unspecified chronic kidney disease: Secondary | ICD-10-CM | POA: Diagnosis present

## 2023-12-28 DIAGNOSIS — D61818 Other pancytopenia: Secondary | ICD-10-CM | POA: Diagnosis not present

## 2023-12-28 DIAGNOSIS — N179 Acute kidney failure, unspecified: Secondary | ICD-10-CM | POA: Diagnosis present

## 2023-12-28 DIAGNOSIS — K7460 Unspecified cirrhosis of liver: Secondary | ICD-10-CM | POA: Insufficient documentation

## 2023-12-28 DIAGNOSIS — E1165 Type 2 diabetes mellitus with hyperglycemia: Secondary | ICD-10-CM | POA: Diagnosis present

## 2023-12-28 DIAGNOSIS — R188 Other ascites: Secondary | ICD-10-CM | POA: Insufficient documentation

## 2023-12-28 DIAGNOSIS — Z79899 Other long term (current) drug therapy: Secondary | ICD-10-CM | POA: Diagnosis not present

## 2023-12-28 DIAGNOSIS — I480 Paroxysmal atrial fibrillation: Secondary | ICD-10-CM | POA: Diagnosis present

## 2023-12-28 DIAGNOSIS — N182 Chronic kidney disease, stage 2 (mild): Secondary | ICD-10-CM | POA: Diagnosis present

## 2023-12-28 DIAGNOSIS — Z7984 Long term (current) use of oral hypoglycemic drugs: Secondary | ICD-10-CM | POA: Diagnosis not present

## 2023-12-28 LAB — GRAM STAIN

## 2023-12-28 LAB — COMPREHENSIVE METABOLIC PANEL WITH GFR
ALT: 29 U/L (ref 0–44)
AST: 29 U/L (ref 15–41)
Albumin: 2.2 g/dL — ABNORMAL LOW (ref 3.5–5.0)
Alkaline Phosphatase: 47 U/L (ref 38–126)
Anion gap: 11 (ref 5–15)
BUN: 59 mg/dL — ABNORMAL HIGH (ref 8–23)
CO2: 18 mmol/L — ABNORMAL LOW (ref 22–32)
Calcium: 8.5 mg/dL — ABNORMAL LOW (ref 8.9–10.3)
Chloride: 102 mmol/L (ref 98–111)
Creatinine, Ser: 2.24 mg/dL — ABNORMAL HIGH (ref 0.61–1.24)
GFR, Estimated: 29 mL/min — ABNORMAL LOW (ref >60)
Glucose, Bld: 166 mg/dL — ABNORMAL HIGH (ref 70–99)
Potassium: 4.9 mmol/L (ref 3.5–5.1)
Sodium: 131 mmol/L — ABNORMAL LOW (ref 135–145)
Total Bilirubin: 0.6 mg/dL (ref 0.0–1.2)
Total Protein: 6.4 g/dL — ABNORMAL LOW (ref 6.5–8.1)

## 2023-12-28 LAB — BLOOD GAS, VENOUS
Acid-base deficit: 7.4 mmol/L — ABNORMAL HIGH (ref 0.0–2.0)
Bicarbonate: 18 mmol/L — ABNORMAL LOW (ref 20.0–28.0)
O2 Saturation: 54.1 %
Patient temperature: 36.7
pCO2, Ven: 35 mmHg — ABNORMAL LOW (ref 44–60)
pH, Ven: 7.32 (ref 7.25–7.43)
pO2, Ven: 36 mmHg (ref 32–45)

## 2023-12-28 LAB — BODY FLUID CELL COUNT WITH DIFFERENTIAL
Eos, Fluid: 0 %
Lymphs, Fluid: 18 %
Monocyte-Macrophage-Serous Fluid: 72 % (ref 50–90)
Neutrophil Count, Fluid: 10 % (ref 0–25)
Total Nucleated Cell Count, Fluid: 93 uL (ref 0–1000)

## 2023-12-28 LAB — CBC
HCT: 29.2 % — ABNORMAL LOW (ref 39.0–52.0)
Hemoglobin: 9.3 g/dL — ABNORMAL LOW (ref 13.0–17.0)
MCH: 27.6 pg (ref 26.0–34.0)
MCHC: 31.8 g/dL (ref 30.0–36.0)
MCV: 86.6 fL (ref 80.0–100.0)
Platelets: 143 10*3/uL — ABNORMAL LOW (ref 150–400)
RBC: 3.37 MIL/uL — ABNORMAL LOW (ref 4.22–5.81)
RDW: 15.3 % (ref 11.5–15.5)
WBC: 7.9 10*3/uL (ref 4.0–10.5)
nRBC: 0 % (ref 0.0–0.2)

## 2023-12-28 LAB — PROTEIN / CREATININE RATIO, URINE
Creatinine, Urine: 54 mg/dL
Protein Creatinine Ratio: 0.11 mg/mg{creat} (ref 0.00–0.15)
Total Protein, Urine: 6 mg/dL

## 2023-12-28 LAB — GLUCOSE, CAPILLARY
Glucose-Capillary: 141 mg/dL — ABNORMAL HIGH (ref 70–99)
Glucose-Capillary: 147 mg/dL — ABNORMAL HIGH (ref 70–99)

## 2023-12-28 LAB — PROTIME-INR
INR: 1.2 (ref 0.8–1.2)
Prothrombin Time: 15.4 s — ABNORMAL HIGH (ref 11.4–15.2)

## 2023-12-28 LAB — MAGNESIUM: Magnesium: 1.7 mg/dL (ref 1.7–2.4)

## 2023-12-28 LAB — HEMOGLOBIN A1C
Hgb A1c MFr Bld: 6.5 % — ABNORMAL HIGH (ref 4.8–5.6)
Mean Plasma Glucose: 139.85 mg/dL

## 2023-12-28 LAB — SODIUM, URINE, RANDOM: Sodium, Ur: 45 mmol/L

## 2023-12-28 LAB — CBG MONITORING, ED
Glucose-Capillary: 148 mg/dL — ABNORMAL HIGH (ref 70–99)
Glucose-Capillary: 132 mg/dL — ABNORMAL HIGH (ref 70–99)

## 2023-12-28 LAB — CREATININE, URINE, RANDOM: Creatinine, Urine: 55 mg/dL

## 2023-12-28 LAB — ALBUMIN, PLEURAL OR PERITONEAL FLUID: Albumin, Fluid: <1.5 g/dL

## 2023-12-28 LAB — PHOSPHORUS: Phosphorus: 4.4 mg/dL (ref 2.5–4.6)

## 2023-12-28 MED ORDER — ALBUMIN HUMAN 25 % IV SOLN
50.0000 g | Freq: Once | INTRAVENOUS | Status: AC
  Administered 2023-12-28: 50 g via INTRAVENOUS
  Filled 2023-12-28: qty 200

## 2023-12-28 MED ORDER — LIDOCAINE HCL (PF) 1 % IJ SOLN
INTRAMUSCULAR | Status: AC
  Filled 2023-12-28: qty 30

## 2023-12-28 MED ORDER — LIDOCAINE HCL (PF) 1 % IJ SOLN
8.0000 mL | Freq: Once | INTRAMUSCULAR | Status: AC
  Administered 2023-12-28: 8 mL via INTRADERMAL

## 2023-12-28 MED ORDER — INSULIN ASPART 100 UNIT/ML IJ SOLN
0.0000 [IU] | Freq: Every day | INTRAMUSCULAR | Status: DC
  Administered 2023-12-29 – 2023-12-31 (×2): 2 [IU] via SUBCUTANEOUS

## 2023-12-28 MED ORDER — FUROSEMIDE 10 MG/ML IJ SOLN
20.0000 mg | Freq: Two times a day (BID) | INTRAMUSCULAR | Status: DC
  Administered 2023-12-28: 20 mg via INTRAVENOUS
  Filled 2023-12-28: qty 2

## 2023-12-28 MED ORDER — INSULIN ASPART 100 UNIT/ML IJ SOLN
0.0000 [IU] | Freq: Three times a day (TID) | INTRAMUSCULAR | Status: DC
  Administered 2023-12-28 – 2023-12-29 (×4): 1 [IU] via SUBCUTANEOUS
  Administered 2023-12-29: 3 [IU] via SUBCUTANEOUS
  Administered 2023-12-29: 2 [IU] via SUBCUTANEOUS
  Administered 2023-12-30 (×2): 3 [IU] via SUBCUTANEOUS
  Administered 2023-12-30: 2 [IU] via SUBCUTANEOUS
  Administered 2023-12-31: 3 [IU] via SUBCUTANEOUS
  Administered 2023-12-31 (×2): 2 [IU] via SUBCUTANEOUS
  Administered 2024-01-01: 1 [IU] via SUBCUTANEOUS
  Administered 2024-01-01: 2 [IU] via SUBCUTANEOUS
  Administered 2024-01-01: 7 [IU] via SUBCUTANEOUS
  Administered 2024-01-02: 1 [IU] via SUBCUTANEOUS
  Administered 2024-01-02: 3 [IU] via SUBCUTANEOUS

## 2023-12-28 MED ORDER — DILTIAZEM HCL ER COATED BEADS 120 MG PO CP24
120.0000 mg | ORAL_CAPSULE | Freq: Every day | ORAL | Status: DC
  Administered 2023-12-29 – 2024-01-02 (×5): 120 mg via ORAL
  Filled 2023-12-28 (×5): qty 1

## 2023-12-28 NOTE — Assessment & Plan Note (Signed)
 I/Os not recorded.  8L paracentesis yesterday.  UOP low.   - Resume Lasix  - Obtain echo

## 2023-12-28 NOTE — Procedures (Addendum)
 PROCEDURE SUMMARY:  Successful ultrasound guided paracentesis from the right lower quadrant.  Yielded 8 L of clear yellow fluid.  No immediate complications.  The patient tolerated the procedure well.   Specimen sent for labs.  EBL < 2 mL  Jetta Morrow, AGACNP-BC 12/28/2023, 2:03 PM

## 2023-12-28 NOTE — ED Notes (Signed)
 Report given to receiving RN.

## 2023-12-28 NOTE — Assessment & Plan Note (Signed)
 8L para on 6/1, supplemented albumin .  High SAAG implies this is pHTN

## 2023-12-28 NOTE — Assessment & Plan Note (Signed)
 -  Hold Lipitor

## 2023-12-28 NOTE — Evaluation (Signed)
 Physical Therapy Evaluation Patient Details Name: Bob Warren MRN: [id number] DOB: 02-Dec-1945 Today's Date: 12/28/2023  History of Present Illness  78 y.o. male presents to [facility] 12/27/23 with SOB and worsening B LE edema. Concern for acute on chronic HFpEF. PMHx:  chronic HFpEF, myasthenia gravis, PAF, GAVE, chronic bilateral lower extremity edema, hypertension, hyperlipidemia, GERD, type 2 diabetes  Clinical Impression  Pt supine on stretcher upon arrival and agreeable to PT eval. PTA, pt needed assist for all mobility from family and would use a RW. Pt reports increased difficulty lately with needing more help from family. In today's session, pt required ModA for bed mobility and MinA to stand with RW. Pt was able to take steps towards [facility], however, declined further gait training. Recommending post-acute rehab <3hrs to work towards independence with mobility and decrease caregiver burden. Pt is in agreement and wants to work on getting stronger and needing less assistance from family. Pt would benefit from acute skilled PT with current functional limitations listed below (see PT Problem List). Acute PT to follow.         If plan is discharge home, recommend the following: A little help with walking and/or transfers;A little help with bathing/dressing/bathroom;Assistance with cooking/housework;Assist for transportation;Help with stairs or ramp for entrance   Can travel by private vehicle   Yes    Equipment Recommendations None recommended by PT     Functional Status Assessment Patient has had a recent decline in their functional status and demonstrates the ability to make significant improvements in function in a reasonable and predictable amount of time.     Precautions / Restrictions Precautions Precautions: Fall Restrictions Weight Bearing Restrictions Per Provider Order: No      Mobility  Bed Mobility Overal bed mobility: Needs Assistance Bed Mobility: Rolling, Sidelying to Sit,  Sit to Sidelying Rolling: Min assist Sidelying to sit: Mod assist    Sit to sidelying: Mod assist General bed mobility comments: MinA to roll and ModA to bring LE's off EOB and raise trunk. ModA to return to supine for LE management    Transfers Overall transfer level: Needs assistance Equipment used: Rolling walker (2 wheels) Transfers: Sit to/from Stand Sit to Stand: Min assist    General transfer comment: MinA for boost-up with use of RW. Able to take side-steps towards [facility] with CGA. Declined further gait training      Balance Overall balance assessment: Needs assistance, Mild deficits observed, not formally tested Sitting-balance support: No upper extremity supported, Feet supported Sitting balance-Leahy Scale: Fair     Standing balance support: Bilateral upper extremity supported, During functional activity, Reliant on assistive device for balance Standing balance-Leahy Scale: Poor Standing balance comment: reliant on RW            Pertinent Vitals/Pain Pain Assessment Pain Assessment: Faces Faces Pain Scale: Hurts even more Pain Location: back Pain Descriptors / Indicators: Aching, Discomfort Pain Intervention(s): Limited activity within patient's tolerance, Monitored during session, Repositioned    Home Living Family/patient expects to be discharged to:: Private residence Living Arrangements: Spouse/significant other Available Help at Discharge: Family;Available 24 hours/day (daughter-in-law does a lot of the physical assist, in process of getting PCA) Type of Home: House Home Access: Ramped entrance    Home Layout: One level Home Equipment: Grab bars - tub/shower;Rolling Walker (2 wheels);Wheelchair - manual;Tub bench;Grab bars - toilet Additional Comments: Daughter in law comes in the morning and at night to help pt with bed mobility    Prior Function Prior Level of Function :  Needs assist    Mobility Comments: Assist for bed mobility, transfers, and  ambulating with RW ADLs Comments: Has been doing sponge baths. Assist for all ADLs     Extremity/Trunk Assessment   Upper Extremity Assessment Upper Extremity Assessment: Defer to OT evaluation    Lower Extremity Assessment Lower Extremity Assessment: Generalized weakness       Communication   Communication Communication: No apparent difficulties    Cognition Arousal: Alert Behavior During Therapy: WFL for tasks assessed/performed   PT - Cognitive impairments: No apparent impairments    Following commands: Intact       Cueing Cueing Techniques: Verbal cues     General Comments General comments (skin integrity, edema, etc.): VSS on 2L     PT Assessment Patient needs continued PT services  PT Problem List Decreased strength;Decreased balance;Decreased activity tolerance;Decreased mobility       PT Treatment Interventions DME instruction;Gait training;Functional mobility training;Therapeutic activities;Therapeutic exercise;Balance training;Neuromuscular re-education;Patient/family education    PT Goals (Current goals can be found in the Care Plan section)  Acute Rehab PT Goals Patient Stated Goal: to get stronger and need less help for mobility PT Goal Formulation: With patient Time For Goal Achievement: 01/11/24 Potential to Achieve Goals: Good    Frequency Min 2X/week        AM-PAC PT "6 Clicks" Mobility  Outcome Measure Help needed turning from your back to your side while in a flat bed without using bedrails?: A Lot Help needed moving from lying on your back to sitting on the side of a flat bed without using bedrails?: A Lot Help needed moving to and from a bed to a chair (including a wheelchair)?: A Little Help needed standing up from a chair using your arms (e.g., wheelchair or bedside chair)?: A Little Help needed to walk in hospital room?: A Lot Help needed climbing 3-5 steps with a railing? : A Lot 6 Click Score: 14    End of Session Equipment  Utilized During Treatment: Gait belt;Oxygen Activity Tolerance: Patient tolerated treatment well Patient left: in bed;with call bell/phone within reach Nurse Communication: Mobility status PT Visit Diagnosis: Unsteadiness on feet (R26.81);Other abnormalities of gait and mobility (R26.89);Muscle weakness (generalized) (M62.81)    Time: 1610-9604 PT Time Calculation (min) (ACUTE ONLY): 24 min   Charges:   PT Evaluation $PT Eval Low Complexity: 1 Low   PT General Charges $$ ACUTE PT VISIT: 1 Visit        Orysia Blas, PT, DPT Secure Chat Preferred  Rehab Office [phone]   Alissa April Adela Ades 12/28/2023, 10:03 AM

## 2023-12-28 NOTE — ED Notes (Signed)
 This RN and NT to change pt depends

## 2023-12-28 NOTE — Progress Notes (Signed)
  Progress Note   Patient: Bob Warren BJY:[id number] DOB: [birth year] DOA: 12/27/2023     0 DOS: the patient was seen and examined on 12/28/2023 at 10:14AM      Brief hospital course: 78 y.o. M with myasthenia gravis, not currently on therapy, GIB, cirrhosis, DM, dCHF, HTN, IDA and pAF not on [facility] who presented with leg swelling and abdominal swelling, progressive over months.  He is now unable to walk.  In the ER, he had ascites and 3+ pitting to the abdomen.  Also noted to have Cr up to 2.4 from baseline 1.  Given Lasix  100 mg IV and admitted for diuresis.      Assessment and Plan: * Acute on chronic diastolic CHF (congestive heart failure) (HCC) - Obtain echo - Hold Lasix  today and obtain large volume paracentesis    AKI (acute kidney injury) (HCC) Baseline Cr 1.0-1.3, here more than doubled to 2.34 mg/dL.  Improved overnight with diuresis.    Overall given improvement with Lasix  100 mg IV, suspect this is congestive from RHF. Low suspicion for HRS at present - Obtain echo - Paracentesis with albumin  for diuresis today, resume Lasix  tomorrow - Obtain US  renal - Obtain urine lytes    Cirrhosis (HCC)    Ascites - Obtain Paracentesis - Give 6g/L albumin  replacement  Iron deficiency anemia - Check iron stores  HLD (hyperlipidemia) - Hold Lipitor  Hypertension BP normal - Continue diltiazem   DM (diabetes mellitus) (HCC) GLucose normal - Hold glargine and meformin - Continue SS corrections  Myasthenia gravis (HCC) Not currently on therapy  Hyponatremia Mild, asymptomatic  [place] acidosis, due to AKI, mild        Subjective: Patient still dyspneic, still swollen, no significnt change from arrival.  No fever, no sputum, no confusion, no bleeding.     Physical Exam: BP (!) 122/58 (BP Location: Left Arm)   Pulse 100   Temp 97.7 F (36.5 C) (Oral)   Resp 18   Ht 5\' 7"  (1.702 m)   Wt 95.2 kg   SpO2 96%   BMI 32.87 kg/m   Adult male, lying in  bed, interactive and appropriate, appears tired and weak Tachycardic, regular, no murmurs, marked pitting edema up to the flanks Respiratory effort normal, lung sounds diminished, no rales appreciated Abdomen with massive ascites, no tenderness to palpation Attention normal, affect blunted, judgment and insight appear normal, face symmetric, speech fluent, generalized weakness    Data Reviewed: Basic metabolic panel shows elevated creatinine, hyponatremia  Family Communication:     Disposition: Status is: Inpatient         Author: Ephriam Hashimoto, MD 12/28/2023 4:23 PM  For on call review [email or web address].

## 2023-12-28 NOTE — Assessment & Plan Note (Addendum)
 Baseline Cr 1.0-1.3, here more than doubled to 2.34 mg/dL.  Improved overnight with diuresis.    Overall given improvement with Lasix  100 mg IV, suspect this is congestive from RHF. Low suspicion for HRS at present - Obtain echo - Paracentesis with albumin  for diuresis today, resume Lasix  tomorrow - Obtain US  renal - Obtain urine lytes

## 2023-12-28 NOTE — Assessment & Plan Note (Signed)
BP stable, hold oral meds pending improved p.o. status.  IV hydralazine as needed. 

## 2023-12-28 NOTE — Assessment & Plan Note (Signed)
-   Check iron stores 

## 2023-12-28 NOTE — ED Notes (Signed)
 Pt to [place].

## 2023-12-28 NOTE — Assessment & Plan Note (Signed)
 Glucose contorlled - Hold glargine and meformin - Continue SS corrections

## 2023-12-28 NOTE — Hospital Course (Signed)
 77 y.o. M with myasthenia gravis, not currently on therapy, GIB, cirrhosis, DM, dCHF, HTN, IDA and pAF not on [facility] who presented with leg swelling and abdominal swelling, progressive over months.  He is now unable to walk.  In the ER, he had ascites and 3+ pitting to the abdomen.  Also noted to have Cr up to 2.4 from baseline 1.  Given Lasix  100 mg IV and admitted for diuresis.

## 2023-12-28 NOTE — Assessment & Plan Note (Signed)
Not currently on therapy 

## 2023-12-29 ENCOUNTER — Inpatient Hospital Stay (HOSPITAL_COMMUNITY)

## 2023-12-29 DIAGNOSIS — I5033 Acute on chronic diastolic (congestive) heart failure: Secondary | ICD-10-CM | POA: Diagnosis not present

## 2023-12-29 DIAGNOSIS — D696 Thrombocytopenia, unspecified: Secondary | ICD-10-CM | POA: Insufficient documentation

## 2023-12-29 DIAGNOSIS — I5031 Acute diastolic (congestive) heart failure: Secondary | ICD-10-CM

## 2023-12-29 LAB — COMPREHENSIVE METABOLIC PANEL WITH GFR
ALT: 21 U/L (ref 0–44)
AST: 24 U/L (ref 15–41)
Albumin: 2.4 g/dL — ABNORMAL LOW (ref 3.5–5.0)
Alkaline Phosphatase: 33 U/L — ABNORMAL LOW (ref 38–126)
Anion gap: 9 (ref 5–15)
BUN: 51 mg/dL — ABNORMAL HIGH (ref 8–23)
CO2: 19 mmol/L — ABNORMAL LOW (ref 22–32)
Calcium: 8 mg/dL — ABNORMAL LOW (ref 8.9–10.3)
Chloride: 106 mmol/L (ref 98–111)
Creatinine, Ser: 2.01 mg/dL — ABNORMAL HIGH (ref 0.61–1.24)
GFR, Estimated: 34 mL/min — ABNORMAL LOW (ref >60)
Glucose, Bld: 118 mg/dL — ABNORMAL HIGH (ref 70–99)
Potassium: 4.3 mmol/L (ref 3.5–5.1)
Sodium: 134 mmol/L — ABNORMAL LOW (ref 135–145)
Total Bilirubin: 0.9 mg/dL (ref 0.0–1.2)
Total Protein: 5.3 g/dL — ABNORMAL LOW (ref 6.5–8.1)

## 2023-12-29 LAB — GLUCOSE, CAPILLARY
Glucose-Capillary: 128 mg/dL — ABNORMAL HIGH (ref 70–99)
Glucose-Capillary: 219 mg/dL — ABNORMAL HIGH (ref 70–99)
Glucose-Capillary: 157 mg/dL — ABNORMAL HIGH (ref 70–99)
Glucose-Capillary: 212 mg/dL — ABNORMAL HIGH (ref 70–99)

## 2023-12-29 LAB — CBC
HCT: 25.5 % — ABNORMAL LOW (ref 39.0–52.0)
Hemoglobin: 8.3 g/dL — ABNORMAL LOW (ref 13.0–17.0)
MCH: 27.6 pg (ref 26.0–34.0)
MCHC: 32.5 g/dL (ref 30.0–36.0)
MCV: 84.7 fL (ref 80.0–100.0)
Platelets: 75 10*3/uL — ABNORMAL LOW (ref 150–400)
RBC: 3.01 MIL/uL — ABNORMAL LOW (ref 4.22–5.81)
RDW: 15.3 % (ref 11.5–15.5)
WBC: 3.7 10*3/uL — ABNORMAL LOW (ref 4.0–10.5)
nRBC: 0 % (ref 0.0–0.2)

## 2023-12-29 LAB — ECHOCARDIOGRAM COMPLETE
Area-P 1/2: 3.16 cm2
Height: 67 in
S' Lateral: 2.5 cm
Weight: 3336.88 [oz_av]

## 2023-12-29 LAB — IRON AND TIBC
Iron: 18 ug/dL — ABNORMAL LOW (ref 45–182)
Saturation Ratios: 8 % — ABNORMAL LOW (ref 17.9–39.5)
TIBC: 230 ug/dL — ABNORMAL LOW (ref 250–450)
UIBC: 212 ug/dL

## 2023-12-29 LAB — FERRITIN: Ferritin: 18 ng/mL — ABNORMAL LOW (ref 24–336)

## 2023-12-29 MED ORDER — IPRATROPIUM-ALBUTEROL 0.5-2.5 (3) MG/3ML IN SOLN: 3.0000 mL | Freq: Four times a day (QID) | RESPIRATORY_TRACT | Status: DC | PRN

## 2023-12-29 MED ORDER — FERROUS SULFATE 325 (65 FE) MG PO TABS
325.0000 mg | ORAL_TABLET | ORAL | Status: DC
  Administered 2023-12-29 – 2024-01-02 (×3): 325 mg via ORAL
  Filled 2023-12-29 (×3): qty 1

## 2023-12-29 MED ORDER — FUROSEMIDE 10 MG/ML IJ SOLN
80.0000 mg | Freq: Once | INTRAMUSCULAR | Status: AC
  Administered 2023-12-29: 80 mg via INTRAVENOUS
  Filled 2023-12-29: qty 8

## 2023-12-29 MED ORDER — ENOXAPARIN SODIUM 40 MG/0.4ML IJ SOSY
40.0000 mg | PREFILLED_SYRINGE | Freq: Every day | INTRAMUSCULAR | Status: DC
  Administered 2023-12-30 – 2024-01-02 (×4): 40 mg via SUBCUTANEOUS
  Filled 2023-12-29 (×4): qty 0.4

## 2023-12-29 NOTE — Plan of Care (Signed)

## 2023-12-29 NOTE — Evaluation (Signed)
 Occupational Therapy Evaluation Patient Details Name: Bob Warren MRN: [id number] DOB: Sep 03, 1945 Today's Date: 12/29/2023   History of Present Illness   78 y.o. male presents to [facility] 12/27/23 with SOB and worsening B LE edema. Concern for acute on chronic HFpEF. PMHx:  chronic HFpEF, myasthenia gravis, PAF, GAVE, chronic bilateral lower extremity edema, hypertension, hyperlipidemia, GERD, type 2 diabetes     Clinical Impressions Pt presents with decline in function and safety with ADLs and ADL mobility with impaired strength, balance and endurance; limited by back pain. PTA pt lives with his wife, has been doing sponge baths, requires assist for all ADLs, assist for bed mobility, transfers, and ambulating with RW. Pt reports that his daughter in law assists him a swell. Pt currently requires mod A to sit EOB, mod A with UB ADLs, max - total A with LB ADLs, total A with toileting and mod/min A with mobility/transfers using RW. Pt would benefit from acute OT services to address impairments to maximize level of function and safety      If plan is discharge home, recommend the following:   A lot of help with bathing/dressing/bathroom;A lot of help with walking and/or transfers;Assist for transportation;Help with stairs or ramp for entrance     Functional Status Assessment   Patient has had a recent decline in their functional status and demonstrates the ability to make significant improvements in function in a reasonable and predictable amount of time.     Equipment Recommendations   None recommended by OT     Recommendations for Other Services         Precautions/Restrictions   Precautions Precautions: Fall Restrictions Weight Bearing Restrictions Per Provider Order: No     Mobility Bed Mobility Overal bed mobility: Needs Assistance Bed Mobility: Rolling, Sidelying to Sit, Sit to Sidelying Rolling: Min assist Sidelying to sit: Mod assist     Sit to sidelying: Mod  assist General bed mobility comments: mod A to elevate trunk and with LE mgt    Transfers Overall transfer level: Needs assistance Equipment used: Rolling walker (2 wheels) Transfers: Sit to/from Stand Sit to Stand: Mod assist, Min assist           General transfer comment: mod A initially from EOB, momentum required to power up      Balance Overall balance assessment: Needs assistance, Mild deficits observed, not formally tested Sitting-balance support: No upper extremity supported, Feet supported Sitting balance-Leahy Scale: Fair     Standing balance support: Bilateral upper extremity supported, During functional activity, Reliant on assistive device for balance Standing balance-Leahy Scale: Poor                             ADL either performed or assessed with clinical judgement   ADL Overall ADL's : Needs assistance/impaired Eating/Feeding: Set up;Independent;Sitting   Grooming: Wash/dry hands;Wash/dry face;Contact guard assist   Upper Body Bathing: Moderate assistance   Lower Body Bathing: Maximal assistance   Upper Body Dressing : Moderate assistance   Lower Body Dressing: Total assistance   Toilet Transfer: Moderate assistance;Minimal assistance;Rolling walker (2 wheels);Stand-pivot;BSC/3in1   Toileting- Architect and Hygiene: Total assistance       Functional mobility during ADLs: Moderate assistance;Caregiver able to provide necessary level of assistance;Rolling walker (2 wheels) General ADL Comments: pt requires some assist with ADLs at baseline     Vision Baseline Vision/History: 1 Wears glasses Ability to See in Adequate Light: 0 Adequate Patient Visual  Report: No change from baseline       Perception         Praxis         Pertinent Vitals/Pain Pain Assessment Pain Assessment: Faces Faces Pain Scale: Hurts little more Pain Location: back Pain Descriptors / Indicators: Aching, Discomfort Pain Intervention(s):  Limited activity within patient's tolerance, Monitored during session, Repositioned     Extremity/Trunk Assessment Upper Extremity Assessment Upper Extremity Assessment: Generalized weakness;Right hand dominant;RUE deficits/detail;LUE deficits/detail RUE Deficits / Details: AROM in shoulder limited 0-75 degrees, pt reports no hx of RTC injury, OA or fxs LUE Deficits / Details: AROM in shouler limited 0-75 degrees   Lower Extremity Assessment Lower Extremity Assessment: Defer to PT evaluation       Communication Communication Communication: No apparent difficulties   Cognition Arousal: Alert Behavior During Therapy: WFL for tasks assessed/performed                                 Following commands: Intact       Cueing  General Comments   Cueing Techniques: Verbal cues      Exercises     Shoulder Instructions      Home Living Family/patient expects to be discharged to:: Private residence Living Arrangements: Spouse/significant other Available Help at Discharge: Family;Available 24 hours/day Type of Home: House Home Access: Ramped entrance     Home Layout: One level     Bathroom Shower/Tub: Chief Strategy Officer: Standard     Home Equipment: Grab bars - tub/shower;Rolling Environmental consultant (2 wheels);Wheelchair - manual;Tub bench;Grab bars - toilet   Additional Comments: Daughter in law comes in the morning and at night to help pt with bed mobility, wife assists with ADLs, toileting      Prior Functioning/Environment Prior Level of Function : Needs assist             Mobility Comments: Assist for bed mobility, transfers, and ambulating with RW ADLs Comments: Has been doing sponge baths. Assist for all ADLs    OT Problem List: Decreased strength;Decreased range of motion;Decreased activity tolerance;Impaired balance (sitting and/or standing);Impaired UE functional use   OT Treatment/Interventions: Self-care/ADL training;Patient/family  education;Therapeutic exercise;DME and/or AE instruction;Therapeutic activities      OT Goals(Current goals can be found in the care plan section)   Acute Rehab OT Goals Patient Stated Goal: get stronger OT Goal Formulation: With patient Time For Goal Achievement: 01/12/24 Potential to Achieve Goals: Good ADL Goals Pt Will Perform Grooming: with supervision;with set-up;sitting Pt Will Perform Upper Body Bathing: with min assist;sitting Pt Will Perform Lower Body Bathing: with mod assist Pt Will Perform Upper Body Dressing: with min assist Pt Will Perform Lower Body Dressing: with max assist;with mod assist Pt Will Transfer to Toilet: with min assist;with contact guard assist;ambulating Pt Will Perform Toileting - Clothing Manipulation and hygiene: with mod assist;sitting/lateral leans;sit to/from stand   OT Frequency:  Min 2X/week    Co-evaluation              AM-PAC OT "6 Clicks" Daily Activity     Outcome Measure Help from another person eating meals?: A Little Help from another person taking care of personal grooming?: A Little Help from another person toileting, which includes using toliet, bedpan, or urinal?: Total Help from another person bathing (including washing, rinsing, drying)?: A Lot Help from another person to put on and taking off regular upper body clothing?: A Lot  Help from another person to put on and taking off regular lower body clothing?: Total 6 Click Score: 12   End of Session Equipment Utilized During Treatment: Gait belt;Rolling walker (2 wheels);Other (comment) (BSC)  Activity Tolerance: Patient limited by fatigue Patient left: in bed;with call bell/phone within reach  OT Visit Diagnosis: Other abnormalities of gait and mobility (R26.89);Unsteadiness on feet (R26.81);Pain;Muscle weakness (generalized) (M62.81) Pain - part of body:  (back)                Time: 1610-9604 OT Time Calculation (min): 38 min Charges:  OT General Charges $OT  Visit: 1 Visit OT Evaluation $OT Eval Moderate Complexity: 1 Mod OT Treatments $Self Care/Home Management : 8-22 mins $Therapeutic Activity: 8-22 mins   Fonda Hymen [facility] 12/29/2023, 2:04 PM

## 2023-12-29 NOTE — NC FL2 (Signed)
 [place]  MEDICAID FL2 LEVEL OF CARE FORM     IDENTIFICATION  Patient Name: Bob Warren Birthdate: Aug 27, 1945 Sex: male Admission Date (Current Location): 12/27/2023  [facility] and [place] Number:  Producer, television/film/video and Address:  [facility], [address] [place], [place] [address]      Provider Number: [id number]  Attending Physician Name and Address:  Ephriam Hashimoto, *  Relative Name and Phone Number:       Current Level of Care: Hospital Recommended Level of Care: Skilled Nursing Facility Prior Approval Number:    Date Approved/Denied:   PASRR Number: [id number]  Discharge Plan: SNF    Current Diagnoses: Patient Active Problem List   Diagnosis Date Noted   Thrombocytopenia (HCC) 12/29/2023   AKI (acute kidney injury) (HCC) 12/28/2023   Ascites 12/28/2023   Cirrhosis (HCC) 12/28/2023   Acute on chronic diastolic CHF (congestive heart failure) (HCC) 12/27/2023   Pancytopenia (HCC) 01/17/2023   GI bleed 01/16/2023   Acute blood loss anemia 01/15/2023   Esophageal varices without bleeding (HCC) 06/10/2022   Iron deficiency anemia 06/10/2022   GAVE (gastric antral vascular ectasia) 06/10/2022   Portal hypertensive gastropathy (HCC) 06/10/2022   Barrett's esophagus without dysplasia 06/10/2022   Gastroesophageal reflux disease without esophagitis 06/10/2022   Cellulitis of right lower extremity    Type 2 diabetes mellitus with obesity (HCC) 01/26/2018   GIB (gastrointestinal bleeding) 01/13/2018   Elevated liver enzymes    Drug-induced pancytopenia (HCC)    Weakness 05/07/2017   Muscle weakness (generalized) 05/07/2017   HLD (hyperlipidemia) 04/01/2017   URI (upper respiratory infection) 04/01/2017   Chronic back pain 04/01/2017   Hypertension    Myasthenia gravis (HCC) 03/27/2017   DM (diabetes mellitus) (HCC) 03/27/2017    Orientation RESPIRATION BLADDER Height & Weight     Self, Time, Situation, Place  Normal  Incontinent Weight: 208 lb 8.9 oz (94.6 kg) Height:  5\' 7"  (170.2 cm)  BEHAVIORAL SYMPTOMS/MOOD NEUROLOGICAL BOWEL NUTRITION STATUS      Continent Diet (see dc summary)  AMBULATORY STATUS COMMUNICATION OF NEEDS Skin   Extensive Assist Verbally                         Personal Care Assistance Level of Assistance  Bathing, Feeding, Dressing Bathing Assistance: Maximum assistance Feeding assistance: Independent Dressing Assistance: Maximum assistance     Functional Limitations Info  Sight, Speech, Hearing Sight Info: Impaired (eyeglasses) Hearing Info: Adequate Speech Info: Adequate    SPECIAL CARE FACTORS FREQUENCY  PT (By licensed PT), OT (By licensed OT)     PT Frequency: 5x/week OT Frequency: 5x/week            Contractures Contractures Info: Not present    Additional Factors Info  Code Status, Allergies, Insulin  Sliding Scale Code Status Info: DNR Limited Allergies Info: Imuran  (azathioprine ), Lisinopril   Insulin  Sliding Scale Info: see dc summary       Current Medications (12/29/2023):  This is the current hospital active medication list Current Facility-Administered Medications  Medication Dose Route Frequency Provider Last Rate Last Admin   acetaminophen  (TYLENOL ) tablet 650 mg  650 mg Oral Q6H PRN Hall, Carole N, DO   650 mg at 12/28/23 0510   diltiazem  (CARDIZEM  CD) 24 hr capsule 120 mg  120 mg Oral Daily Danford, Christopher P, MD   120 mg at 12/29/23 0909   [START ON 12/30/2023] enoxaparin  (LOVENOX ) injection 40 mg  40  mg Subcutaneous Daily Danford, Willis Harter, MD       ferrous sulfate tablet 325 mg  325 mg Oral QODAY Danford, Christopher P, MD   325 mg at 12/29/23 1427   insulin  aspart (novoLOG ) injection 0-5 Units  0-5 Units Subcutaneous QHS Reesa Cannon N, DO       insulin  aspart (novoLOG ) injection 0-9 Units  0-9 Units Subcutaneous TID WC Reesa Cannon N, DO   2 Units at 12/29/23 1616   ipratropium-albuterol  (DUONEB) 0.5-2.5 (3) MG/3ML nebulizer  solution 3 mL  3 mL Nebulization Q6H PRN Danford, Willis Harter, MD       melatonin tablet 5 mg  5 mg Oral QHS PRN Hall, Carole N, DO   5 mg at 12/29/23 0025   polyethylene glycol (MIRALAX  / GLYCOLAX ) packet 17 g  17 g Oral Daily PRN Reesa Cannon N, DO       prochlorperazine (COMPAZINE) injection 5 mg  5 mg Intravenous Q6H PRN Bary Boss, DO         Discharge Medications: Please see discharge summary for a list of discharge medications.  Relevant Imaging Results:  Relevant Lab Results:   Additional Information SSN: [id number] [place], LCSWA

## 2023-12-29 NOTE — TOC Initial Note (Signed)
 Transition of Care [facility]) - Initial/Assessment Note    Patient Details  Name: Bob Warren MRN: [id number] Date of Birth: 1945/07/31  [facility]) CM/SW Contact:    Arron Big, LCSWA Phone Number: 12/29/2023, 3:38 PM  Clinical Narrative:   CSW met with patient at bedside to discuss PT recs for SNF. Patient is agreeable to SNF at this time. Patient stated he does not want to go to [facility].   TOC will continue to follow.               Expected Discharge Plan: Skilled Nursing Facility Barriers to Discharge: Continued Medical Work up, SNF Pending bed offer, Insurance Authorization   Patient Goals and CMS Choice Patient states their goals for this hospitalization and ongoing recovery are:: To go to SNF for rehab          Expected Discharge Plan and Services In-house Referral: Clinical Social Work     Living arrangements for the past 2 months: Single Family Home                                      Prior Living Arrangements/Services Living arrangements for the past 2 months: Single Family Home Lives with:: Self Patient language and need for interpreter reviewed:: Yes Do you feel safe going back to the place where you live?: Yes      Need for Family Participation in Patient Care: No (Comment) Care giver support system in place?: No (comment)   Criminal Activity/Legal Involvement Pertinent to Current Situation/Hospitalization: No - Comment as needed  Activities of Daily Living   ADL Screening (condition at time of admission) Independently performs ADLs?: No Does the patient have a NEW difficulty with bathing/dressing/toileting/self-feeding that is expected to last >3 days?: No Does the patient have a NEW difficulty with getting in/out of bed, walking, or climbing stairs that is expected to last >3 days?: No Does the patient have a NEW difficulty with communication that is expected to last >3 days?: No Is the patient deaf or have  difficulty hearing?: No Does the patient have difficulty seeing, even when wearing glasses/contacts?: No Does the patient have difficulty concentrating, remembering, or making decisions?: No  Permission Sought/Granted Permission sought to share information with : Facility Medical sales representative, Family Supports Permission granted to share information with : Yes, Verbal Permission Granted  Share Information with NAME: Valinda Gault  Permission granted to share info w AGENCY: SNFs  Permission granted to share info w Relationship: Daughter  Permission granted to share info w Contact Information: [phone]  Emotional Assessment Appearance:: Appears stated age Attitude/Demeanor/Rapport: Engaged Affect (typically observed): Pleasant Orientation: : Oriented to Self, Oriented to Place, Oriented to  Time, Oriented to Situation Alcohol / Substance Use: Not Applicable Psych Involvement: No (comment)  Admission diagnosis:  Shortness of breath [R06.02] Pleural effusion [J90] AKI (acute kidney injury) (HCC) [N17.9] Patient Active Problem List   Diagnosis Date Noted   Thrombocytopenia (HCC) 12/29/2023   AKI (acute kidney injury) (HCC) 12/28/2023   Ascites 12/28/2023   Cirrhosis (HCC) 12/28/2023   Acute on chronic diastolic CHF (congestive heart failure) (HCC) 12/27/2023   Pancytopenia (HCC) 01/17/2023   GI bleed 01/16/2023   Acute blood loss anemia 01/15/2023   Esophageal varices without bleeding (HCC) 06/10/2022   Iron deficiency anemia 06/10/2022   GAVE (gastric antral vascular ectasia) 06/10/2022   Portal hypertensive gastropathy (HCC) 06/10/2022   Barrett's  esophagus without dysplasia 06/10/2022   Gastroesophageal reflux disease without esophagitis 06/10/2022   Cellulitis of right lower extremity    Type 2 diabetes mellitus with obesity (HCC) 01/26/2018   GIB (gastrointestinal bleeding) 01/13/2018   Elevated liver enzymes    Drug-induced pancytopenia (HCC)    Weakness 05/07/2017    Muscle weakness (generalized) 05/07/2017   HLD (hyperlipidemia) 04/01/2017   URI (upper respiratory infection) 04/01/2017   Chronic back pain 04/01/2017   Hypertension    Myasthenia gravis (HCC) 03/27/2017   DM (diabetes mellitus) (HCC) 03/27/2017   PCP:  Darylene Epley, MD Pharmacy:   [facility] [address] [place] - [address] [place] [address] Phone: [phone] Fax: [phone]     Social Drivers of Health (SDOH) Social History: SDOH Screenings   Food Insecurity: No Food Insecurity (12/28/2023)  Housing: Low Risk  (12/28/2023)  Transportation Needs: No Transportation Needs (12/28/2023)  Recent Concern: Transportation Needs - Unmet Transportation Needs (12/24/2023)   Received from [facility]  Utilities: Not At Risk (12/28/2023)  Financial Resource Strain: Medium Risk (12/24/2023)   Received from [facility]  Physical Activity: Sufficiently Active (11/27/2022)   Received from [facility], [facility]  Social Connections: Moderately Integrated (12/28/2023)  Stress: No Stress Concern Present (11/27/2022)   [facility] from [facility], [facility]  Tobacco Use: Medium Risk (12/28/2023)  Health Literacy: Low Risk  (11/27/2022)   Received from [facility], [facility]   SDOH Interventions:     Readmission Risk Interventions     No data to display

## 2023-12-29 NOTE — Progress Notes (Signed)
  Progress Note   Patient: Bob Warren YQM:[id number] DOB: April 15, 1946 DOA: 12/27/2023     1 DOS: the patient was seen and examined on 12/29/2023 at 7:41AM      Brief hospital course: 78 y.o. M with myasthenia gravis, not currently on therapy, GIB, cirrhosis, DM, dCHF, HTN, IDA and pAF not on [facility] who presented with leg swelling and abdominal swelling, progressive over months.  He is now unable to walk.  In the ER, he had ascites and 3+ pitting to the abdomen.  Also noted to have Cr up to 2.4 from baseline 1.  Given Lasix  100 mg IV and admitted for diuresis due to combination CHF and portal hypertension.      Assessment and Plan: * Acute on chronic diastolic CHF (congestive heart failure) (HCC) I/Os not recorded.  8L paracentesis yesterday.  UOP low.   - Resume Lasix  - Obtain echo    AKI (acute kidney injury) (HCC) Baseline Cr 1.0-1.3, here more than doubled to 2.34 mg/dL.    Slow improvement with diuresis, paracentesis (8L on 6/1). Renal US  unremarable, FeUrea pending, no sig proteinuria, UA not glomerular  suspect this is congestive from RHF. Low suspicion for HRS at present - Echo ordered, pending - Obtain US  renal - Continue diuresis - Hold ARB   Thrombocytopenia (HCC) Pancytopenia Normal on admission, but dropped down to baseline 75K today.  Due to cirrhosis. - Trend CBC  Cirrhosis (HCC) INR normal, albumin  low 2s.  Tbili normal.  Platelets 75K.  Ascites 8L para on 6/1, supplemented albumin .  High SAAG implies this is pHTN  Iron deficiency anemia Marked iron defiicency - Resume iron supplement - Outaptient referral  HLD (hyperlipidemia) - Hold Lipitor  Hypertension BP normal - Continue diltiazem  - Hold ARB  DM (diabetes mellitus) (HCC) Glucose contorlled - Hold glargine and meformin - Continue SS corrections  Myasthenia gravis (HCC) Not currently on therapy, no clinical signs          Subjective: Feeling slightly better.  Edema is somewhat  better.  No fever, no chest pain, no orthopnea.       Physical Exam: BP 128/70 (BP Location: Left Arm)   Pulse 94   Temp 98.3 F (36.8 C) (Oral)   Resp 18   Ht 5\' 7"  (1.702 m)   Wt 94.6 kg   SpO2 99%   BMI 32.66 kg/m   Adult male, lying in bed, appears weak and tired RRR, I do not appreciate murmurs, he still has pitting edema in the legs but this is considerably better than yesterday Respiratory rate normal, lungs diminished but no rales appreciated Abdomen soft, ascites is reduced but still present, no tenderness palpation in all quadrants Attention normal, affect appropriate, judgment and insight appear normal   Data Reviewed: CBC shows pancytopenia, all indices dropping Basic metabolic panel shows creatinine improved slightly to 2.0 Mild hyponatremia Total bilirubin normal Iron indices low ultrasound unremarkable   Family Communication: None present    Disposition: Status is: Inpatient         Author: Ephriam Hashimoto, MD 12/29/2023 1:44 PM  For on call review [email or web address].

## 2023-12-29 NOTE — Assessment & Plan Note (Addendum)
 Pancytopenia Normal on admission, but dropped down to baseline 75K today.  Due to cirrhosis. - Trend CBC

## 2023-12-29 NOTE — Progress Notes (Signed)
  Echocardiogram 2D Echocardiogram has been performed.  Fain Home RDCS 12/29/2023, 3:10 PM

## 2023-12-30 ENCOUNTER — Telehealth: Payer: Self-pay | Admitting: Family Medicine

## 2023-12-30 ENCOUNTER — Telehealth: Payer: Self-pay

## 2023-12-30 DIAGNOSIS — D500 Iron deficiency anemia secondary to blood loss (chronic): Secondary | ICD-10-CM | POA: Insufficient documentation

## 2023-12-30 DIAGNOSIS — I5033 Acute on chronic diastolic (congestive) heart failure: Secondary | ICD-10-CM | POA: Diagnosis not present

## 2023-12-30 LAB — BASIC METABOLIC PANEL WITH GFR
Anion gap: 6 (ref 5–15)
BUN: 44 mg/dL — ABNORMAL HIGH (ref 8–23)
CO2: 24 mmol/L (ref 22–32)
Calcium: 8.1 mg/dL — ABNORMAL LOW (ref 8.9–10.3)
Chloride: 108 mmol/L (ref 98–111)
Creatinine, Ser: 1.77 mg/dL — ABNORMAL HIGH (ref 0.61–1.24)
GFR, Estimated: 39 mL/min — ABNORMAL LOW (ref >60)
Glucose, Bld: 174 mg/dL — ABNORMAL HIGH (ref 70–99)
Potassium: 3.6 mmol/L (ref 3.5–5.1)
Sodium: 138 mmol/L (ref 135–145)

## 2023-12-30 LAB — GLUCOSE, CAPILLARY
Glucose-Capillary: 151 mg/dL — ABNORMAL HIGH (ref 70–99)
Glucose-Capillary: 215 mg/dL — ABNORMAL HIGH (ref 70–99)
Glucose-Capillary: 241 mg/dL — ABNORMAL HIGH (ref 70–99)
Glucose-Capillary: 196 mg/dL — ABNORMAL HIGH (ref 70–99)

## 2023-12-30 LAB — CBC
HCT: 26 % — ABNORMAL LOW (ref 39.0–52.0)
Hemoglobin: 8.3 g/dL — ABNORMAL LOW (ref 13.0–17.0)
MCH: 27.2 pg (ref 26.0–34.0)
MCHC: 31.9 g/dL (ref 30.0–36.0)
MCV: 85.2 fL (ref 80.0–100.0)
Platelets: 87 10*3/uL — ABNORMAL LOW (ref 150–400)
RBC: 3.05 MIL/uL — ABNORMAL LOW (ref 4.22–5.81)
RDW: 15.6 % — ABNORMAL HIGH (ref 11.5–15.5)
WBC: 3.9 10*3/uL — ABNORMAL LOW (ref 4.0–10.5)
nRBC: 0 % (ref 0.0–0.2)

## 2023-12-30 LAB — CYTOLOGY - NON PAP

## 2023-12-30 LAB — UREA NITROGEN, URINE: Urea Nitrogen, Ur: 613 mg/dL

## 2023-12-30 MED ORDER — FUROSEMIDE 10 MG/ML IJ SOLN
80.0000 mg | Freq: Two times a day (BID) | INTRAMUSCULAR | Status: DC
  Administered 2023-12-30 – 2024-01-01 (×5): 80 mg via INTRAVENOUS
  Filled 2023-12-30 (×5): qty 8

## 2023-12-30 NOTE — Telephone Encounter (Signed)
 Auth Submission: NO AUTH NEEDED Site of care: Site of care: [facility] Payer: [employer] medicare Medication & CPT/J Code(s) submitted: Venofer (Iron Sucrose) J1756 Route of submission (phone, fax, portal):  Phone # Fax # Auth type: Buy/Bill PB Units/visits requested: 200mg  x 5 doses Reference number:  Approval from: 12/30/23 to 04/30/24

## 2023-12-30 NOTE — Telephone Encounter (Signed)
 Patient referred to infusion pharmacy team for ambulatory infusion of IV iron.  Insurance - [employer]  Dx code - D50.0  IV Iron Therapy - Venofer 200 mg IV x 5  Infusion appointments - [employer] Scheduling team will schedule patient as soon as possible.   Rivaldo Hineman D. Yichen Gilardi, PharmD

## 2023-12-30 NOTE — Plan of Care (Signed)
  Problem: Coping: Goal: Ability to adjust to condition or change in health will improve Outcome: Progressing   Problem: Fluid Volume: Goal: Ability to maintain a balanced intake and output will improve Outcome: Progressing   Problem: Health Behavior/Discharge Planning: Goal: Ability to manage health-related needs will improve Outcome: Progressing

## 2023-12-30 NOTE — Progress Notes (Signed)
 Progress Note   Patient: Bob Warren KGM:[id number] DOB: Jun 26, 1946 DOA: 12/27/2023     2 DOS: the patient was seen and examined on 12/30/2023 at 8:21AM      Brief hospital course: 78 y.o. M with myasthenia gravis, not currently on therapy, GIB, cirrhosis, DM, dCHF, HTN, IDA and pAF not on [facility] who presented with leg swelling and abdominal swelling, progressive over months.  He is now unable to walk.  In the ER, he had ascites and 3+ pitting to the abdomen.  Also noted to have Cr up to 2.4 from baseline 1.  Given Lasix  100 mg IV and admitted for diuresis due to combination CHF and portal hypertension.   Daughter reports he was off his diuretic pill for over a month, because he was confused about whether his diuretic pills were in his pillbox.        Assessment and Plan: * NASH cirrhosis with ascites Portal hypertension Acute on chronic diastolic CHF  Admitted and started on diuretics.   8L paracentesis 6/1.  Lasix  resumed yesterday, net negative 1.3L still edematous.  Echo showed no RHF, preserved LVEF.   - Continue IV Lasix , increase dose - Daily BMP - Potassium supplement   - Likely to need [facility] for med management after discharge     AKI (acute kidney injury) (HCC) Baseline Cr 1.0-1.3, here more than doubled to 2.34 mg/dL on admission.  Slow improvement with diuresis, paracentesis (8L on 6/1). Renal US  unremarkable, FeUrea pending, no sig proteinuria, UA not glomerular  suspect this is congestive from RHF. Low suspicion for HRS   - Continue diuresis - Hold ARB   Thrombocytopenia (HCC) Pancytopenia Normal on admission, but dropped down to baseline 75K.  Due to cirrhosis. Panytopenia stable - Trend CBC  NASH Cirrhosis (HCC) INR normal, albumin  low 2s.  Tbili normal.  Platelets 75K.  Ascites 8L para on 6/1, supplemented albumin .  High SAAG implies this is pHTN  Iron deficiency anemia Marked iron deficiency despite outpatient iron supplementation - Continue  iron supplement - Outpatient IV iron referral placed  HLD (hyperlipidemia) - Hold Lipitor  Hypertension BP normal - Continue diltiazem  - Hold ARB  DM (diabetes mellitus) (HCC) Glucose trending up - Hold glargine and meformin - Continue SS corrections  Myasthenia gravis (HCC) Not currently on therapy, no clinical signs No longer on IVIG          Subjective: Patient still swollen, but overall feeling better.  Appetite okay.  No fever, no abdominal pain.     Physical Exam: BP 129/69 (BP Location: Left Arm)   Pulse 91   Temp 97.9 F (36.6 C) (Oral)   Resp 20   Ht 5\' 7"  (1.702 m)   Wt 92.8 kg   SpO2 98%   BMI 32.04 kg/m   Adult male, lying in bed, interactive and appropriate, eating breakfast RRR, soft systolic murmur, still 2+ pitting edema to the thighs, no JVD Respiratory rate normal, lungs diminished, no rales appreciated Abdomen soft, moderate ascites still present, no tenderness to palpation at all Attention normal, affect pleasant, judgment and insight appear normal, generalized weakness but symmetric strength   Data Reviewed: CBC shows persistent pancytopenia unchanged Basic metabolic panel shows creatinine down to 1.7, hyponatremia   Family Communication: daughter by phone    Disposition: Status is: Inpatient 78 yo M with cirrhosis and CHF who presented with massive fluids overload.  S/p paracentesis and will need a few more days of diuresis expected, then will need SNF  Author: Ephriam Hashimoto, MD 12/30/2023 2:09 PM  For on call review [email or web address].

## 2023-12-30 NOTE — TOC Progression Note (Addendum)
 Transition of Care [facility]) - Progression Note    Patient Details  Name: Bob Warren MRN: [id number] Date of Birth: [birth year]  [facility] CM/SW Contact  Arron Big, [place] Phone Number: 12/30/2023, 10:44 AM  Clinical Narrative:   At this time there are no bed offers for SNF. CSW called [facility], they asked CSW hard fax referral to [phone]. CSW reached out to [facility] and is awaiting a response.  11:00AM CSW spoke with patient at bedside about no bed offers right now due to his insurance. Patient expressed his understanding and informed CSW that his PCP put in his patient portal that he can go home with [facility]. Patient stated home health would work better for him. CSW will notify treatment team.   TOC will continue to follow.     Expected Discharge Plan: Skilled Nursing Facility Barriers to Discharge: Continued Medical Work up, SNF Pending bed offer, Insurance Authorization  Expected Discharge Plan and Services In-house Referral: Clinical Social Work     Living arrangements for the past 2 months: Single Family Home                                       Social Determinants of Health (SDOH) Interventions SDOH Screenings   Food Insecurity: No Food Insecurity (12/28/2023)  Housing: Low Risk  (12/28/2023)  Transportation Needs: No Transportation Needs (12/28/2023)  Recent Concern: Transportation Needs - Unmet Transportation Needs (12/24/2023)   Received from [facility]  Utilities: Not At Risk (12/28/2023)  Financial Resource Strain: Medium Risk (12/24/2023)   Received from [facility]  Physical Activity: Sufficiently Active (11/27/2022)   Received from [facility], [facility]  Social Connections: Moderately Integrated (12/28/2023)  Stress: No Stress Concern Present (11/27/2022)   Received from [facility], [facility]  Tobacco Use: Medium Risk (12/28/2023)  Health Literacy: Low Risk  (11/27/2022)   Received from [facility], [facility] Care    Readmission Risk Interventions     No data to display

## 2023-12-31 DIAGNOSIS — I5033 Acute on chronic diastolic (congestive) heart failure: Secondary | ICD-10-CM | POA: Diagnosis not present

## 2023-12-31 LAB — GLUCOSE, CAPILLARY
Glucose-Capillary: 160 mg/dL — ABNORMAL HIGH (ref 70–99)
Glucose-Capillary: 206 mg/dL — ABNORMAL HIGH (ref 70–99)
Glucose-Capillary: 169 mg/dL — ABNORMAL HIGH (ref 70–99)
Glucose-Capillary: 211 mg/dL — ABNORMAL HIGH (ref 70–99)

## 2023-12-31 LAB — CBC
HCT: 27.7 % — ABNORMAL LOW (ref 39.0–52.0)
Hemoglobin: 8.9 g/dL — ABNORMAL LOW (ref 13.0–17.0)
MCH: 27.3 pg (ref 26.0–34.0)
MCHC: 32.1 g/dL (ref 30.0–36.0)
MCV: 85 fL (ref 80.0–100.0)
Platelets: 94 10*3/uL — ABNORMAL LOW (ref 150–400)
RBC: 3.26 MIL/uL — ABNORMAL LOW (ref 4.22–5.81)
RDW: 15.5 % (ref 11.5–15.5)
WBC: 5.3 10*3/uL (ref 4.0–10.5)
nRBC: 0 % (ref 0.0–0.2)

## 2023-12-31 LAB — COMPREHENSIVE METABOLIC PANEL WITH GFR
ALT: 36 U/L (ref 0–44)
AST: 50 U/L — ABNORMAL HIGH (ref 15–41)
Albumin: 2.1 g/dL — ABNORMAL LOW (ref 3.5–5.0)
Alkaline Phosphatase: 41 U/L (ref 38–126)
Anion gap: 7 (ref 5–15)
BUN: 44 mg/dL — ABNORMAL HIGH (ref 8–23)
CO2: 24 mmol/L (ref 22–32)
Calcium: 8.1 mg/dL — ABNORMAL LOW (ref 8.9–10.3)
Chloride: 106 mmol/L (ref 98–111)
Creatinine, Ser: 1.58 mg/dL — ABNORMAL HIGH (ref 0.61–1.24)
GFR, Estimated: 45 mL/min — ABNORMAL LOW (ref >60)
Glucose, Bld: 166 mg/dL — ABNORMAL HIGH (ref 70–99)
Potassium: 3.2 mmol/L — ABNORMAL LOW (ref 3.5–5.1)
Sodium: 137 mmol/L (ref 135–145)
Total Bilirubin: 0.6 mg/dL (ref 0.0–1.2)
Total Protein: 5.5 g/dL — ABNORMAL LOW (ref 6.5–8.1)

## 2023-12-31 LAB — PROTIME-INR
INR: 1.2 (ref 0.8–1.2)
Prothrombin Time: 15.8 s — ABNORMAL HIGH (ref 11.4–15.2)

## 2023-12-31 MED ORDER — POTASSIUM CHLORIDE CRYS ER 20 MEQ PO TBCR
40.0000 meq | EXTENDED_RELEASE_TABLET | ORAL | Status: AC
  Administered 2023-12-31 (×3): 40 meq via ORAL
  Filled 2023-12-31 (×3): qty 2

## 2023-12-31 MED ORDER — INSULIN GLARGINE-YFGN 100 UNIT/ML ~~LOC~~ SOLN
10.0000 [IU] | Freq: Every day | SUBCUTANEOUS | Status: DC
  Administered 2023-12-31 – 2024-01-02 (×3): 10 [IU] via SUBCUTANEOUS
  Filled 2023-12-31 (×3): qty 0.1

## 2023-12-31 NOTE — Progress Notes (Signed)
 Physical Therapy Treatment Patient Details Name: Bob Warren MRN: [id number] DOB: 1945/10/05 Today's Date: 12/31/2023   History of Present Illness 78 y.o. male presents to [facility] 12/27/23 with SOB and worsening B LE edema. Concern for acute on chronic HFpEF. PMHx:  chronic HFpEF, myasthenia gravis, PAF, GAVE, chronic bilateral lower extremity edema, hypertension, hyperlipidemia, GERD, type 2 diabetes    PT Comments  Pt has made improvements toward goals despite having little therapy.  Emphasis on strengthening exercise, transitions, multiple (6) sit to stand trials working to come forward, standing activity during peri care, gait in the room x2 trials.    If plan is discharge home, recommend the following: A little help with walking and/or transfers;A little help with bathing/dressing/bathroom;Assistance with cooking/housework;Assist for transportation;Help with stairs or ramp for entrance   Can travel by private vehicle        Equipment Recommendations  None recommended by PT    Recommendations for Other Services       Precautions / Restrictions Precautions Recall of Precautions/Restrictions: Intact     Mobility  Bed Mobility Overal bed mobility: Needs Assistance Bed Mobility: Rolling, Sidelying to Sit, Sit to Sidelying Rolling: Min assist Sidelying to sit: Min assist       General bed mobility comments: improvements with ability to come w/shift forward and scoot to EOB    Transfers Overall transfer level: Needs assistance Equipment used: Rolling walker (2 wheels) Transfers: Sit to/from Stand Sit to Stand: Min assist (x6  working on coming appropriately forward.)                Ambulation/Gait Ambulation/Gait assistance: Editor, commissioning (Feet): 20 Feet (then 15 around the bed holding to rails and foot board.due to conjested area.) Assistive device: Rolling walker (2 wheels) (rails) Gait Pattern/deviations: Step-through pattern, Decreased step length -  right, Decreased step length - left   Gait velocity interpretation: <1.31 ft/sec, indicative of household ambulator   General Gait Details: weak-kneed gait pattern with short mildly unccoordinated steps, flexed posture, moderate use of the RW and rails.   Stairs             Wheelchair Mobility     Tilt Bed    Modified Rankin (Stroke Patients Only)       Balance   Sitting-balance support: No upper extremity supported, Feet supported Sitting balance-Leahy Scale: Fair     Standing balance support: Bilateral upper extremity supported, During functional activity, Reliant on assistive device for balance Standing balance-Leahy Scale: Poor Standing balance comment: reliant on RW                            Communication Communication Communication: No apparent difficulties  Cognition Arousal: Alert Behavior During Therapy: WFL for tasks assessed/performed   PT - Cognitive impairments: No apparent impairments                         Following commands: Intact      Cueing Cueing Techniques: Verbal cues  Exercises Other Exercises Other Exercises: hip/knee flexion/ext with graded resistance bil x 10 reps Other Exercises: hipab/add x12 reps bil Other Exercises: SLR x 10 reps mildly assisted x 10 reps bil Other Exercises: bicep/tricep presses with graded resistance x10 reps bil    General Comments General comments (skin integrity, edema, etc.): vss on RA      Pertinent Vitals/Pain Pain Assessment Pain Assessment: Faces Faces Pain Scale: Hurts little more  Pain Location: back, hip, toe Pain Descriptors / Indicators: Aching, Discomfort Pain Intervention(s): Monitored during session    Home Living                          Prior Function            PT Goals (current goals can now be found in the care plan section) Acute Rehab PT Goals Patient Stated Goal: to get stronger and need less help for mobility PT Goal Formulation: With  patient Time For Goal Achievement: 01/11/24 Potential to Achieve Goals: Good Progress towards PT goals: Progressing toward goals    Frequency    Min 2X/week      PT Plan      Co-evaluation              AM-PAC PT "6 Clicks" Mobility   Outcome Measure  Help needed turning from your back to your side while in a flat bed without using bedrails?: A Little Help needed moving from lying on your back to sitting on the side of a flat bed without using bedrails?: A Little Help needed moving to and from a bed to a chair (including a wheelchair)?: A Lot Help needed standing up from a chair using your arms (e.g., wheelchair or bedside chair)?: A Little Help needed to walk in hospital room?: A Little Help needed climbing 3-5 steps with a railing? : A Lot 6 Click Score: 16    End of Session   Activity Tolerance: Patient tolerated treatment well Patient left: in bed;with call bell/phone within reach Nurse Communication: Mobility status PT Visit Diagnosis: Unsteadiness on feet (R26.81);Other abnormalities of gait and mobility (R26.89);Muscle weakness (generalized) (M62.81)     Time: 0454-0981 PT Time Calculation (min) (ACUTE ONLY): 35 min  Charges:    $Gait Training: 8-22 mins $Therapeutic Exercise: 8-22 mins PT General Charges $$ ACUTE PT VISIT: 1 Visit                     12/31/2023  [name]., PT Acute Rehabilitation Services [phone]  (office)   [name] [name] 12/31/2023, 4:42 PM

## 2023-12-31 NOTE — TOC Progression Note (Signed)
 Transition of Care [facility]) - Progression Note    Patient Details  Name: Bob Warren MRN: [id number] Date of Birth: 08-Jun-1946  [facility] CM/SW Contact  Bob Bickers, RN Phone Number: 12/31/2023, 2:04 PM  Clinical Narrative:     Met with patient at bedside to discuss DC plans. He confirms that he wants to go home with [facility] services. He states that he has rollator, and grab bars in his bathroom.  He is unsure if he wants a BSC for the shower. He deferred this to his daughter Bob Warren. Shower at home has a corner seat that is built in. She would like me to order a [facility] and I have that delivered to the room so they can take it home with him. He is set up with [facility] can do telemonitoring with his PCP where he can send his daily weights on a special scale they provide to the MD for adjustments if needed on diuretics to prevent readmissions.    Attending, daughter, and [facility] updated/ agreeable/ aware of plan to change to [facility].   Bob Warren (Daughter) [phone]   Expected Discharge Plan: Skilled Nursing Facility Barriers to Discharge: Continued Medical Work up  Expected Discharge Plan and Services In-house Referral: Clinical Social Work Discharge Planning Services: CM Consult Post Acute Care Choice: Home Health Living arrangements for the past 2 months: Single Family Home                           HH Arranged: PT, OT           Social Determinants of Health (SDOH) Interventions SDOH Screenings   Food Insecurity: No Food Insecurity (12/28/2023)  Housing: Low Risk  (12/28/2023)  Transportation Needs: No Transportation Needs (12/28/2023)  Recent Concern: Transportation Needs - Unmet Transportation Needs (12/24/2023)   Received from [facility]  Utilities: Not At Risk (12/28/2023)  Financial Resource Strain: Medium Risk (12/24/2023)   Received from [facility]  Physical Activity: Sufficiently Active (11/27/2022)   Received  from [facility], [facility]  Social Connections: Moderately Integrated (12/28/2023)  Stress: No Stress Concern Present (11/27/2022)   Received from [facility], [facility]  Tobacco Use: Medium Risk (12/28/2023)  Health Literacy: Low Risk  (11/27/2022)   Received from [facility], [facility] Care    Readmission Risk Interventions     No data to display

## 2023-12-31 NOTE — Plan of Care (Signed)
   Problem: Education: Goal: Ability to describe self-care measures that may prevent or decrease complications (Diabetes Survival Skills Education) will improve Outcome: Progressing   Problem: Coping: Goal: Ability to adjust to condition or change in health will improve Outcome: Progressing   Problem: Fluid Volume: Goal: Ability to maintain a balanced intake and output will improve Outcome: Progressing

## 2023-12-31 NOTE — Progress Notes (Signed)
 PROGRESS NOTE    Bob Warren  WUJ:[id number] DOB: April 12, 1946 DOA: 12/27/2023 PCP: Darylene Epley, MD   78 y.o. M with myasthenia gravis, not currently on therapy, GIB, cirrhosis, DM, dCHF, HTN, IDA and pAF not on [facility] who presented with leg swelling and abdominal swelling, progressive over months.  Now limiting mobilityIn the ER, he had ascites and 3+ pitting to the abdomen.  Also noted to have Cr up to 2.4 from baseline 1.  Given Lasix  100 mg IV and admitted for diuresis due to combination CHF and portal hypertension.  Daughter reports he was off his diuretic pill for over a month, because he was confused about whether his diuretic pills were in his pillbox. -Admitted, underwent thoracentesis on 6/1, 8 L drained -Slowly improving with diuresis    Subjective: Feels better, not back to baseline  Assessment and Plan:   Acute on chronic diastolic CHF  -2D echo with preserved EF, normal RV -Underwent paracentesis and diuresis with IV Lasix , -8 L negative, continue IV Lasix  again today - Add Aldactone tomorrow if creatinine continues to improve - Increase activity, PT OT eval  AKI (acute kidney injury) (HCC) Baseline Cr 1.0-1.3, here more than doubled to 2.34 mg/dL.  - Suspect hemodynamically mediated with fluid shifts, large-volume paracentesis - Renal ultrasound unremarkable - Now improving, hold ARB  Thrombocytopenia (HCC) Pancytopenia - Secondary to chronic liver disease and splenic sequestration, stable, monitor  Decompensated cirrhosis (HCC) Portal hypertension, ascites INR normal, albumin  low 2s. - Diuretics as above, sp large-volume paracentesis 6/1, 8 L drained, given albumin   Iron deficiency anemia Marked iron defiicency - Resume iron supplement - Stable  HLD (hyperlipidemia) - Hold Lipitor  Hypertension BP normal - Continue diltiazem  - Hold ARB  DM (diabetes mellitus) (HCC) Glucose contorlled - Hold glargine and meformin - Continue SS  corrections  Myasthenia gravis (HCC) Not currently on therapy, no clinical signs  DVT prophylaxis: SCds Code Status: Full code Family Communication: None sent Disposition Plan: Home in 1 to 2 days  Consultants:    Procedures:   Antimicrobials:    Objective: Vitals:   12/31/23 0041 12/31/23 0510 12/31/23 0721 12/31/23 1113  BP: (!) 128/90 133/74 135/71 (!) 140/78  Pulse: 85 84 88 84  Resp: 20 20 16 18   Temp: 98.9 F (37.2 C) 98.3 F (36.8 C) 98.5 F (36.9 C) 98.3 F (36.8 C)  TempSrc: Oral Oral Oral Oral  SpO2: 96% 97% 95% 97%  Weight:  91.7 kg    Height:        Intake/Output Summary (Last 24 hours) at 12/31/2023 1314 Last data filed at 12/31/2023 0043 Gross per 24 hour  Intake 60 ml  Output 2100 ml  Net -2040 ml   Filed Weights   12/29/23 0537 12/30/23 0030 12/31/23 0510  Weight: 94.6 kg 92.8 kg 91.7 kg    Examination:  General exam: Appears calm and comfortable  Respiratory system: Decreased breath sounds at the bases Cardiovascular system: S1 & S2 heard, RRR.  Abd: Mildly distended, soft and nontender.Normal bowel sounds heard. Central nervous system: Alert and oriented. No focal neurological deficits. Extremities: 1+ edema Skin: No rashes Psychiatry:  Mood & affect appropriate.     Data Reviewed:   CBC: Recent Labs  Lab 12/27/23 2131 12/28/23 0623 12/29/23 0735 12/30/23 0231 12/31/23 0226  WBC 8.7 7.9 3.7* 3.9* 5.3  NEUTROABS 7.0  --   --   --   --   HGB 10.8* 9.3* 8.3* 8.3* 8.9*  HCT 34.3* 29.2* 25.5*  26.0* 27.7*  MCV 88.2 86.6 84.7 85.2 85.0  PLT 214 143* 75* 87* 94*   Basic Metabolic Panel: Recent Labs  Lab 12/27/23 2131 12/28/23 0623 12/28/23 0624 12/29/23 0306 12/30/23 0231 12/31/23 0226  NA 131*  --  131* 134* 138 137  K 5.8*  --  4.9 4.3 3.6 3.2*  CL 100  --  102 106 108 106  CO2 16*  --  18* 19* 24 24  GLUCOSE 189*  --  166* 118* 174* 166*  BUN 58*  --  59* 51* 44* 44*  CREATININE 2.34*  --  2.24* 2.01* 1.77* 1.58*   CALCIUM  8.8*  --  8.5* 8.0* 8.1* 8.1*  MG  --  1.7  --   --   --   --   PHOS  --  4.4  --   --   --   --    GFR: Estimated Creatinine Clearance: 42.3 mL/min (A) (by C-G formula based on SCr of 1.58 mg/dL (H)). Liver Function Tests: Recent Labs  Lab 12/28/23 0624 12/29/23 0306 12/31/23 0226  AST 29 24 50*  ALT 29 21 36  ALKPHOS 47 33* 41  BILITOT 0.6 0.9 0.6  PROT 6.4* 5.3* 5.5*  ALBUMIN  2.2* 2.4* 2.1*   No results for input(s): "LIPASE", "AMYLASE" in the last 168 hours. No results for input(s): "AMMONIA" in the last 168 hours. Coagulation Profile: Recent Labs  Lab 12/28/23 1655 12/31/23 0226  INR 1.2 1.2   Cardiac Enzymes: No results for input(s): "CKTOTAL", "CKMB", "CKMBINDEX", "TROPONINI" in the last 168 hours. BNP (last 3 results) No results for input(s): "PROBNP" in the last 8760 hours. HbA1C: No results for input(s): "HGBA1C" in the last 72 hours. CBG: Recent Labs  Lab 12/30/23 1131 12/30/23 1623 12/30/23 2116 12/31/23 0633 12/31/23 1126  GLUCAP 215* 241* 196* 160* 206*   Lipid Profile: No results for input(s): "CHOL", "HDL", "LDLCALC", "TRIG", "CHOLHDL", "LDLDIRECT" in the last 72 hours. Thyroid  Function Tests: No results for input(s): "TSH", "T4TOTAL", "FREET4", "T3FREE", "THYROIDAB" in the last 72 hours. Anemia Panel: Recent Labs    12/29/23 0306  FERRITIN 18*  TIBC 230*  IRON 18*   Urine analysis:    Component Value Date/Time   COLORURINE AMBER (A) 01/26/2018 2043   APPEARANCEUR CLEAR 01/26/2018 2043   LABSPEC 1.023 01/26/2018 2043   PHURINE 5.0 01/26/2018 2043   GLUCOSEU 50 (A) 01/26/2018 2043   HGBUR NEGATIVE 01/26/2018 2043   BILIRUBINUR SMALL (A) 01/26/2018 2043   KETONESUR NEGATIVE 01/26/2018 2043   PROTEINUR 30 (A) 01/26/2018 2043   NITRITE NEGATIVE 01/26/2018 2043   LEUKOCYTESUR NEGATIVE 01/26/2018 2043   Sepsis Labs: @LABRCNTIP (procalcitonin:4,lacticidven:4)  ) Recent Results (from the past 240 hours)  Culture, body fluid  w Gram Stain-bottle     Status: None (Preliminary result)   Collection Time: 12/28/23 12:28 PM   Specimen: Peritoneal Washings  Result Value Ref Range Status   Specimen Description PERITONEAL  Final   Special Requests ABDOMEN  Final   Culture   Final    NO GROWTH < 24 HOURS Performed at [facility], [address], [place], [place] [address]    Report Status PENDING  Incomplete  Gram stain     Status: None   Collection Time: 12/28/23 12:28 PM   Specimen: Peritoneal Washings  Result Value Ref Range Status   Specimen Description PERITONEAL  Final   Special Requests ABDOMEN  Final   Gram Stain   Final    RARE  WBC PRESENT, PREDOMINANTLY MONONUCLEAR NO ORGANISMS SEEN Performed at [facility], [address] [place], [place] [address]    Report Status 12/28/2023 FINAL  Final     Radiology Studies: ECHOCARDIOGRAM COMPLETE Result Date: 12/29/2023    ECHOCARDIOGRAM REPORT   Patient Name:   ISAC LINCKS Date of Exam: 12/29/2023 Medical Rec #:  [id number]     Height:       67.0 in Accession #:    [id number]    Weight:       208.6 lb Date of Birth:  18-Jan-1946     BSA:          2.059 m Patient Age:    77 years      BP:           128/70 mmHg Patient Gender: M             HR:           87 bpm. Exam Location:  Inpatient Procedure: 2D Echo, Cardiac Doppler and Color Doppler (Both Spectral and Color            Flow Doppler were utilized during procedure). Indications:    CHF Acute Diastolic I50.31  History:        Patient has no prior history of Echocardiogram examinations.  Sonographer:    Hersey Lorenzo RDCS Referring Phys: [id number] CAROLE N HALL IMPRESSIONS  1. Left ventricular ejection fraction, by estimation, is 60 to 65%. The left ventricle has normal function. The left ventricle has no regional wall motion abnormalities.  2. Right ventricular systolic function is normal. The right ventricular size is normal.  3. The mitral valve is normal in structure. No evidence of mitral valve  regurgitation. No evidence of mitral stenosis.  4. The aortic valve is normal in structure. There is mild thickening of the aortic valve. Aortic valve regurgitation is not visualized. No aortic stenosis is present.  5. Aortic dilatation noted. There is dilatation of the ascending aorta, measuring 39 mm.  6. The inferior vena cava is normal in size with greater than 50% respiratory variability, suggesting right atrial pressure of 3 mmHg. FINDINGS  Left Ventricle: Left ventricular ejection fraction, by estimation, is 60 to 65%. The left ventricle has normal function. The left ventricle has no regional wall motion abnormalities. The left ventricular internal cavity size was normal in size. There is  no left ventricular hypertrophy. Left ventricular diastolic function could not be evaluated due to atrial fibrillation. Right Ventricle: The right ventricular size is normal. No increase in right ventricular wall thickness. Right ventricular systolic function is normal. Left Atrium: Left atrial size was normal in size. Right Atrium: Right atrial size was normal in size. Pericardium: There is no evidence of pericardial effusion. Mitral Valve: The mitral valve is normal in structure. No evidence of mitral valve regurgitation. No evidence of mitral valve stenosis. Tricuspid Valve: The tricuspid valve is normal in structure. Tricuspid valve regurgitation is not demonstrated. No evidence of tricuspid stenosis. Aortic Valve: The aortic valve is normal in structure. There is mild thickening of the aortic valve. Aortic valve regurgitation is not visualized. No aortic stenosis is present. Pulmonic Valve: The pulmonic valve was normal in structure. Pulmonic valve regurgitation is not visualized. No evidence of pulmonic stenosis. Aorta: Aortic dilatation noted. There is dilatation of the ascending aorta, measuring 39 mm. Venous: The inferior vena cava is normal in size with greater than 50% respiratory variability, suggesting right  atrial pressure of 3 mmHg. IAS/Shunts:  No atrial level shunt detected by color flow Doppler.  LEFT VENTRICLE PLAX 2D LVIDd:         3.60 cm   Diastology LVIDs:         2.50 cm   LV e' medial:    7.29 cm/s LV PW:         1.10 cm   LV E/e' medial:  8.2 LV IVS:        1.10 cm   LV e' lateral:   11.30 cm/s LVOT diam:     2.00 cm   LV E/e' lateral: 5.3 LV SV:         45 LV SV Index:   22 LVOT Area:     3.14 cm  RIGHT VENTRICLE RV S prime:     11.40 cm/s TAPSE (M-mode): 2.1 cm LEFT ATRIUM             Index LA diam:        3.50 cm 1.70 cm/m LA Vol (A2C):   42.6 ml 20.69 ml/m LA Vol (A4C):   43.4 ml 21.08 ml/m LA Biplane Vol: 45.6 ml 22.15 ml/m  AORTIC VALVE LVOT Vmax:   86.70 cm/s LVOT Vmean:  58.700 cm/s LVOT VTI:    0.144 m  AORTA Ao Root diam: 3.30 cm Ao Asc diam:  3.90 cm MITRAL VALVE MV Area (PHT): 3.16 cm    SHUNTS MV Decel Time: 240 msec    Systemic VTI:  0.14 m MV E velocity: 59.60 cm/s  Systemic Diam: 2.00 cm MV A velocity: 87.00 cm/s MV E/A ratio:  0.69 Aditya Sabharwal Electronically signed by Alwin Baars Signature Date/Time: 12/29/2023/3:24:47 PM    Final      Scheduled Meds:  diltiazem   120 mg Oral Daily   enoxaparin  (LOVENOX ) injection  40 mg Subcutaneous Daily   ferrous sulfate  325 mg Oral QODAY   furosemide   80 mg Intravenous BID   insulin  aspart  0-5 Units Subcutaneous QHS   insulin  aspart  0-9 Units Subcutaneous TID WC   insulin  glargine-yfgn  10 Units Subcutaneous Daily   Continuous Infusions:   LOS: 3 days    Time spent:    Deforest Fast, MD [facility]   12/31/2023, 1:14 PM

## 2024-01-01 DIAGNOSIS — I5033 Acute on chronic diastolic (congestive) heart failure: Secondary | ICD-10-CM | POA: Diagnosis not present

## 2024-01-01 LAB — CBC
HCT: 27.2 % — ABNORMAL LOW (ref 39.0–52.0)
Hemoglobin: 8.9 g/dL — ABNORMAL LOW (ref 13.0–17.0)
MCH: 28.5 pg (ref 26.0–34.0)
MCHC: 32.7 g/dL (ref 30.0–36.0)
MCV: 87.2 fL (ref 80.0–100.0)
Platelets: 101 10*3/uL — ABNORMAL LOW (ref 150–400)
RBC: 3.12 MIL/uL — ABNORMAL LOW (ref 4.22–5.81)
RDW: 15.7 % — ABNORMAL HIGH (ref 11.5–15.5)
WBC: 5.4 10*3/uL (ref 4.0–10.5)
nRBC: 0 % (ref 0.0–0.2)

## 2024-01-01 LAB — GLUCOSE, CAPILLARY
Glucose-Capillary: 128 mg/dL — ABNORMAL HIGH (ref 70–99)
Glucose-Capillary: 175 mg/dL — ABNORMAL HIGH (ref 70–99)
Glucose-Capillary: 319 mg/dL — ABNORMAL HIGH (ref 70–99)
Glucose-Capillary: 183 mg/dL — ABNORMAL HIGH (ref 70–99)

## 2024-01-01 LAB — BASIC METABOLIC PANEL WITH GFR
Anion gap: 7 (ref 5–15)
BUN: 37 mg/dL — ABNORMAL HIGH (ref 8–23)
CO2: 25 mmol/L (ref 22–32)
Calcium: 8.1 mg/dL — ABNORMAL LOW (ref 8.9–10.3)
Chloride: 107 mmol/L (ref 98–111)
Creatinine, Ser: 1.64 mg/dL — ABNORMAL HIGH (ref 0.61–1.24)
GFR, Estimated: 43 mL/min — ABNORMAL LOW (ref >60)
Glucose, Bld: 160 mg/dL — ABNORMAL HIGH (ref 70–99)
Potassium: 4 mmol/L (ref 3.5–5.1)
Sodium: 139 mmol/L (ref 135–145)

## 2024-01-01 MED ORDER — TORSEMIDE 20 MG PO TABS
40.0000 mg | ORAL_TABLET | Freq: Every day | ORAL | Status: DC
  Administered 2024-01-02: 40 mg via ORAL
  Filled 2024-01-01: qty 2

## 2024-01-01 NOTE — Progress Notes (Signed)
 Physical Therapy Treatment Patient Details Name: Bob Warren MRN: [id number] DOB: 1946/04/09 Today's Date: 01/01/2024   History of Present Illness 78 y.o. male presents to [facility] 12/27/23 with SOB and worsening B LE edema. Concern for acute on chronic HFpEF. PMHx:  chronic HFpEF, myasthenia gravis, PAF, GAVE, chronic bilateral lower extremity edema, hypertension, hyperlipidemia, GERD, type 2 diabetes    PT Comments  Pt resting in bed on arrival and agreeable to session. Pt continues to be limited in safe mobility by LE weakness, decreased activity tolerance and impaired balance/postural reactions. Pt continues to require initial assist to boost to stand from EOB, however with repetition and task practice pt able to complete x3 from chair with CGA. Pt demonstrating short labored gait in room with RW support and CGA for safety. Pt declining further gait trials due to fatigue. Pt was educated on continued walker use to maximize functional independence, safety, and decrease risk for falls.  Pt continues to benefit from skilled PT services to progress toward functional mobility goals.     If plan is discharge home, recommend the following: A little help with walking and/or transfers;A little help with bathing/dressing/bathroom;Assistance with cooking/housework;Assist for transportation;Help with stairs or ramp for entrance   Can travel by private vehicle     Yes  Equipment Recommendations  None recommended by PT    Recommendations for Other Services       Precautions / Restrictions Precautions Precautions: Fall Recall of Precautions/Restrictions: Intact Restrictions Weight Bearing Restrictions Per Provider Order: No     Mobility  Bed Mobility Overal bed mobility: Needs Assistance Bed Mobility: Supine to Sit     Supine to sit: Min assist     General bed mobility comments: light min A to manage trunk    Transfers Overall transfer level: Needs assistance Equipment used: Rolling walker  (2 wheels) Transfers: Sit to/from Stand Sit to Stand: Min assist, From elevated surface, Contact guard assist           General transfer comment: min A with cues for momentum, able to compelte x3 from chair at end fo session with CGA    Ambulation/Gait Ambulation/Gait assistance: Contact guard assist Gait Distance (Feet): 18 Feet Assistive device: Rolling walker (2 wheels) (bed rails) Gait Pattern/deviations: Step-through pattern, Decreased step length - right, Decreased step length - left Gait velocity: decr     General Gait Details: weak-kneed gait pattern with short mildly unccoordinated steps, flexed posture, moderate use of the RW and rails.   Stairs             Wheelchair Mobility     Tilt Bed    Modified Rankin (Stroke Patients Only)       Balance Overall balance assessment: Needs assistance, Mild deficits observed, not formally tested Sitting-balance support: No upper extremity supported, Feet supported Sitting balance-Leahy Scale: Fair     Standing balance support: Bilateral upper extremity supported, During functional activity, Reliant on assistive device for balance Standing balance-Leahy Scale: Poor Standing balance comment: reliant on RW                            Communication Communication Communication: No apparent difficulties  Cognition Arousal: Alert Behavior During Therapy: WFL for tasks assessed/performed   PT - Cognitive impairments: No apparent impairments                         Following commands: Intact  Cueing Cueing Techniques: Verbal cues  Exercises Other Exercises Other Exercises: serial sit <>stand x3    General Comments General comments (skin integrity, edema, etc.): VSS on RA      Pertinent Vitals/Pain Pain Assessment Pain Assessment: Faces Faces Pain Scale: Hurts little more Pain Location: back, hip, toe Pain Descriptors / Indicators: Aching, Discomfort Pain Intervention(s):  Monitored during session, Limited activity within patient's tolerance    Home Living                          Prior Function            PT Goals (current goals can now be found in the care plan section) Acute Rehab PT Goals Patient Stated Goal: to get stronger and need less help for mobility PT Goal Formulation: With patient Time For Goal Achievement: 01/11/24 Progress towards PT goals: Progressing toward goals    Frequency    Min 2X/week      PT Plan      Co-evaluation              AM-PAC PT "6 Clicks" Mobility   Outcome Measure  Help needed turning from your back to your side while in a flat bed without using bedrails?: A Little Help needed moving from lying on your back to sitting on the side of a flat bed without using bedrails?: A Little Help needed moving to and from a bed to a chair (including a wheelchair)?: A Lot Help needed standing up from a chair using your arms (e.g., wheelchair or bedside chair)?: A Little Help needed to walk in hospital room?: A Little Help needed climbing 3-5 steps with a railing? : A Lot 6 Click Score: 16    End of Session Equipment Utilized During Treatment: Gait belt Activity Tolerance: Patient tolerated treatment well Patient left: with call bell/phone within reach;in chair;with chair alarm set Nurse Communication: Mobility status PT Visit Diagnosis: Unsteadiness on feet (R26.81);Other abnormalities of gait and mobility (R26.89);Muscle weakness (generalized) (M62.81)     Time: 2440-1027 PT Time Calculation (min) (ACUTE ONLY): 18 min  Charges:    $Gait Training: 8-22 mins PT General Charges $$ ACUTE PT VISIT: 1 Visit                     Lora Glomski R. PTA Acute Rehabilitation Services Office: [phone]   Agapito Horseman 01/01/2024, 11:58 AM

## 2024-01-01 NOTE — Plan of Care (Signed)
  Problem: Fluid Volume: Goal: Ability to maintain a balanced intake and output will improve Outcome: Progressing   Problem: Health Behavior/Discharge Planning: Goal: Ability to manage health-related needs will improve Outcome: Progressing   Problem: Metabolic: Goal: Ability to maintain appropriate glucose levels will improve Outcome: Progressing   Problem: Nutritional: Goal: Maintenance of adequate nutrition will improve Outcome: Progressing   Problem: Clinical Measurements: Goal: Ability to maintain clinical measurements within normal limits will improve Outcome: Progressing

## 2024-01-01 NOTE — Progress Notes (Addendum)
 PROGRESS NOTE    Bob Warren  BJY:[id number] DOB: 1945-08-29 DOA: 12/27/2023 PCP: Darylene Epley, MD   78 y.o. M with myasthenia gravis, not currently on therapy, GIB, cirrhosis, DM, dCHF, HTN, IDA and pAF not on [facility] who presented with leg swelling and abdominal swelling, progressive over months.  Now limiting mobilityIn the ER, he had ascites and 3+ pitting to the abdomen.  Also noted to have Cr up to 2.4 from baseline 1.  Given Lasix  100 mg IV and admitted for diuresis due to combination CHF and portal hypertension.  Daughter reports he was off his diuretic pill for over a month, because he was confused about whether his diuretic pills were in his pillbox. -Admitted, underwent thoracentesis on 6/1, 8 L drained -Slowly improving with diuresis    Subjective: Feels better, breathing improving, getting closer to baseline  Assessment and Plan:   Acute on chronic diastolic CHF  -2D echo with preserved EF, normal RV -Underwent paracentesis and diuresis with IV Lasix , - weight down 12 LB, appears close to euvolemic, transition to oral torsemide tomorrow  - Add Aldactone if creatinine continues to improve - Increase activity, PT OT eval - Discharge planning, home tomorrow if stable, cards referral sent  AKI (acute kidney injury) (HCC) Baseline Cr 1.0-1.3, here more than doubled to 2.34 mg/dL.  - Suspect hemodynamically mediated with fluid shifts, large-volume paracentesis - Renal ultrasound unremarkable - Now improving, hold ARB  Thrombocytopenia (HCC) Pancytopenia - Secondary to chronic liver disease and splenic sequestration, stable, monitor  Decompensated cirrhosis (HCC) Portal hypertension, ascites INR normal, albumin  low 2s. - Diuretics as above, sp large-volume paracentesis 6/1, 8 L drained, given albumin   Iron deficiency anemia Marked iron defiicency - Resume iron supplement - Stable  HLD (hyperlipidemia) - Hold Lipitor  Hypertension BP normal - Continue  diltiazem  - Hold ARB  DM (diabetes mellitus) (HCC) Glucose contorlled - Hold glargine and meformin - Continue SS corrections  Myasthenia gravis (HCC) Not currently on therapy, no clinical signs  DVT prophylaxis: SCds Code Status: Full code Family Communication: None sent Disposition Plan: Home in 1 to 2 days  Consultants:    Procedures:   Antimicrobials:    Objective: Vitals:   01/01/24 0155 01/01/24 0340 01/01/24 0725 01/01/24 1120  BP: 120/61 (!) 120/58 131/65 123/65  Pulse: 72 71 75 75  Resp: 18 18 20 16   Temp: 98.4 F (36.9 C) 98.5 F (36.9 C) 98.7 F (37.1 C) 97.8 F (36.6 C)  TempSrc: Oral Oral Oral Oral  SpO2: 98% 97%    Weight:  89.7 kg    Height:        Intake/Output Summary (Last 24 hours) at 01/01/2024 1258 Last data filed at 01/01/2024 0859 Gross per 24 hour  Intake 300 ml  Output 2400 ml  Net -2100 ml   Filed Weights   12/30/23 0030 12/31/23 0510 01/01/24 0340  Weight: 92.8 kg 91.7 kg 89.7 kg    Examination:  General exam: Appears calm and comfortable  Respiratory system: Decreased breath sounds at the bases Cardiovascular system: S1 & S2 heard, RRR.  Abd: Mildly distended, soft and nontender.Normal bowel sounds heard. Central nervous system: Alert and oriented. No focal neurological deficits. Extremities: 1+ edema Skin: No rashes Psychiatry:  Mood & affect appropriate.     Data Reviewed:   CBC: Recent Labs  Lab 12/27/23 2131 12/28/23 0623 12/29/23 0735 12/30/23 0231 12/31/23 0226 01/01/24 0243  WBC 8.7 7.9 3.7* 3.9* 5.3 5.4  NEUTROABS 7.0  --   --   --   --   --  HGB 10.8* 9.3* 8.3* 8.3* 8.9* 8.9*  HCT 34.3* 29.2* 25.5* 26.0* 27.7* 27.2*  MCV 88.2 86.6 84.7 85.2 85.0 87.2  PLT 214 143* 75* 87* 94* 101*   Basic Metabolic Panel: Recent Labs  Lab 12/28/23 0623 12/28/23 0624 12/29/23 0306 12/30/23 0231 12/31/23 0226 01/01/24 0243  NA  --  131* 134* 138 137 139  K  --  4.9 4.3 3.6 3.2* 4.0  CL  --  102 106 108 106  107  CO2  --  18* 19* 24 24 25   GLUCOSE  --  166* 118* 174* 166* 160*  BUN  --  59* 51* 44* 44* 37*  CREATININE  --  2.24* 2.01* 1.77* 1.58* 1.64*  CALCIUM   --  8.5* 8.0* 8.1* 8.1* 8.1*  MG 1.7  --   --   --   --   --   PHOS 4.4  --   --   --   --   --    GFR: Estimated Creatinine Clearance: 40.3 mL/min (A) (by C-G formula based on SCr of 1.64 mg/dL (H)). Liver Function Tests: Recent Labs  Lab 12/28/23 0624 12/29/23 0306 12/31/23 0226  AST 29 24 50*  ALT 29 21 36  ALKPHOS 47 33* 41  BILITOT 0.6 0.9 0.6  PROT 6.4* 5.3* 5.5*  ALBUMIN  2.2* 2.4* 2.1*   No results for input(s): "LIPASE", "AMYLASE" in the last 168 hours. No results for input(s): "AMMONIA" in the last 168 hours. Coagulation Profile: Recent Labs  Lab 12/28/23 1655 12/31/23 0226  INR 1.2 1.2   Cardiac Enzymes: No results for input(s): "CKTOTAL", "CKMB", "CKMBINDEX", "TROPONINI" in the last 168 hours. BNP (last 3 results) No results for input(s): "PROBNP" in the last 8760 hours. HbA1C: No results for input(s): "HGBA1C" in the last 72 hours. CBG: Recent Labs  Lab 12/31/23 1126 12/31/23 1531 12/31/23 2054 01/01/24 0552 01/01/24 1124  GLUCAP 206* 169* 211* 128* 175*   Lipid Profile: No results for input(s): "CHOL", "HDL", "LDLCALC", "TRIG", "CHOLHDL", "LDLDIRECT" in the last 72 hours. Thyroid  Function Tests: No results for input(s): "TSH", "T4TOTAL", "FREET4", "T3FREE", "THYROIDAB" in the last 72 hours. Anemia Panel: No results for input(s): "VITAMINB12", "FOLATE", "FERRITIN", "TIBC", "IRON", "RETICCTPCT" in the last 72 hours.  Urine analysis:    Component Value Date/Time   COLORURINE AMBER (A) 01/26/2018 2043   APPEARANCEUR CLEAR 01/26/2018 2043   LABSPEC 1.023 01/26/2018 2043   PHURINE 5.0 01/26/2018 2043   GLUCOSEU 50 (A) 01/26/2018 2043   HGBUR NEGATIVE 01/26/2018 2043   BILIRUBINUR SMALL (A) 01/26/2018 2043   KETONESUR NEGATIVE 01/26/2018 2043   PROTEINUR 30 (A) 01/26/2018 2043   NITRITE  NEGATIVE 01/26/2018 2043   LEUKOCYTESUR NEGATIVE 01/26/2018 2043   Sepsis Labs: @LABRCNTIP (procalcitonin:4,lacticidven:4)  ) Recent Results (from the past 240 hours)  Culture, body fluid w Gram Stain-bottle     Status: None (Preliminary result)   Collection Time: 12/28/23 12:28 PM   Specimen: Peritoneal Washings  Result Value Ref Range Status   Specimen Description PERITONEAL  Final   Special Requests ABDOMEN  Final   Culture   Final    NO GROWTH 4 DAYS Performed at [facility], [address], [place], [place] [address]    Report Status PENDING  Incomplete  Gram stain     Status: None   Collection Time: 12/28/23 12:28 PM   Specimen: Peritoneal Washings  Result Value Ref Range Status   Specimen Description PERITONEAL  Final   Special Requests ABDOMEN  Final   Gram Stain   Final    RARE WBC PRESENT, PREDOMINANTLY MONONUCLEAR NO ORGANISMS SEEN Performed at [facility], [address] [place], [place] [address]    Report Status 12/28/2023 FINAL  Final     Radiology Studies: No results found.    Scheduled Meds:  diltiazem   120 mg Oral Daily   enoxaparin  (LOVENOX ) injection  40 mg Subcutaneous Daily   ferrous sulfate  325 mg Oral QODAY   furosemide   80 mg Intravenous BID   insulin  aspart  0-5 Units Subcutaneous QHS   insulin  aspart  0-9 Units Subcutaneous TID WC   insulin  glargine-yfgn  10 Units Subcutaneous Daily   Continuous Infusions:   LOS: 4 days    Time spent:    Deforest Fast, MD [facility]   01/01/2024, 12:58 PM

## 2024-01-01 NOTE — Progress Notes (Signed)
 Occupational Therapy Treatment Patient Details Name: Bob Warren MRN: [id number] DOB: 11/03/1945 Today's Date: 01/01/2024   History of present illness 78 y.o. male presents to [facility] 12/27/23 with SOB and worsening B LE edema. Concern for acute on chronic HFpEF. PMHx:  chronic HFpEF, myasthenia gravis, PAF, GAVE, chronic bilateral lower extremity edema, hypertension, hyperlipidemia, GERD, type 2 diabetes   OT comments  Pt making good progress with functional goals. Pt reports that he is feeling better and will be going home with [facility] therapies vs SNFrehab. OT will continue to follow acutely to maximize level of function and safety      If plan is discharge home, recommend the following:  A lot of help with bathing/dressing/bathroom;Assist for transportation;Help with stairs or ramp for entrance;A little help with walking and/or transfers   Equipment Recommendations  None recommended by OT    Recommendations for Other Services      Precautions / Restrictions Precautions Precautions: Fall Recall of Precautions/Restrictions: Intact Restrictions Weight Bearing Restrictions Per Provider Order: No       Mobility Bed Mobility               General bed mobility comments: pt in chair upon arrival    Transfers Overall transfer level: Needs assistance Equipment used: Rolling walker (2 wheels) Transfers: Sit to/from Stand Sit to Stand: Min assist, Contact guard assist           General transfer comment: min A progressing to CGA with cues for momentum     Balance Overall balance assessment: Needs assistance, Mild deficits observed, not formally tested Sitting-balance support: No upper extremity supported, Feet supported Sitting balance-Leahy Scale: Fair     Standing balance support: Bilateral upper extremity supported, During functional activity, Reliant on assistive device for balance Standing balance-Leahy Scale: Poor                             ADL either  performed or assessed with clinical judgement   ADL Overall ADL's : Needs assistance/impaired     Grooming: Wash/dry hands;Wash/dry face;Contact guard assist   Upper Body Bathing: Minimal assistance;Sitting   Lower Body Bathing: Moderate assistance;Sitting/lateral leans   Upper Body Dressing : Minimal assistance;Sitting       Toilet Transfer: Minimal assistance;Contact guard assist;Ambulation;Rolling walker (2 wheels);BSC/3in1;Cueing for safety   Toileting- Clothing Manipulation and Hygiene: Maximal assistance;Moderate assistance;Sit to/from stand       Functional mobility during ADLs: Minimal assistance;Contact guard assist;Rolling walker (2 wheels);Cueing for safety      Extremity/Trunk Assessment Upper Extremity Assessment Upper Extremity Assessment: Right hand dominant;Generalized weakness;RUE deficits/detail;LUE deficits/detail RUE Deficits / Details: AROM in shoulder limited 0-75 degrees, pt reports no hx of RTC injury, OA or fxs LUE Deficits / Details: AROM in shouler limited 0-75 degrees            Vision Baseline Vision/History: 1 Wears glasses Ability to See in Adequate Light: 0 Adequate Patient Visual Report: No change from baseline     Perception     Praxis     Communication Communication Communication: No apparent difficulties   Cognition Arousal: Alert Behavior During Therapy: WFL for tasks assessed/performed Cognition: No apparent impairments                               Following commands: Intact        Cueing   Cueing Techniques: Verbal cues  Exercises  Other Exercises Other Exercises: pt instructed on chair push ups to increase B tricep strength to improve sit-stand/stand-sit transitions. Pt verbalizes and demos understanding    Shoulder Instructions       General Comments VSS on RA    Pertinent Vitals/ Pain       Pain Assessment Pain Assessment: Faces Faces Pain Scale: Hurts little more Pain Location: back Pain  Descriptors / Indicators: Aching, Discomfort Pain Intervention(s): Monitored during session, Repositioned  Home Living                                          Prior Functioning/Environment              Frequency  Min 2X/week        Progress Toward Goals  OT Goals(current goals can now be found in the care plan section)  Progress towards OT goals: Progressing toward goals     Plan      Co-evaluation                 AM-PAC OT "6 Clicks" Daily Activity     Outcome Measure   Help from another person eating meals?: None Help from another person taking care of personal grooming?: A Little Help from another person toileting, which includes using toliet, bedpan, or urinal?: A Lot Help from another person bathing (including washing, rinsing, drying)?: A Lot Help from another person to put on and taking off regular upper body clothing?: A Little Help from another person to put on and taking off regular lower body clothing?: Total 6 Click Score: 15    End of Session Equipment Utilized During Treatment: Gait belt;Rolling walker (2 wheels);Other (comment) (BSC)  OT Visit Diagnosis: Other abnormalities of gait and mobility (R26.89);Unsteadiness on feet (R26.81);Pain;Muscle weakness (generalized) (M62.81) Pain - part of body:  (back)   Activity Tolerance Patient tolerated treatment well   Patient Left with call bell/phone within reach;in chair   Nurse Communication Mobility status        Time: 1478-2956 OT Time Calculation (min): 28 min  Charges: OT General Charges $OT Visit: 1 Visit OT Treatments $Self Care/Home Management : 8-22 mins $Therapeutic Activity: 8-22 mins   Bob Warren 01/01/2024, 1:12 PM

## 2024-01-01 NOTE — Progress Notes (Signed)
Heart Failure Navigator Progress Note  Assessed for Heart & Vascular TOC clinic readiness.  Patient does not meet criteria due to EF 60-65% , No HF TOC per Dr. Joseph.   Navigator will sign off at this time.   Dawn Fields, BSN, RN Heart Failure Nurse Navigator Secure Chat Only   

## 2024-01-02 ENCOUNTER — Encounter: Payer: Self-pay | Admitting: Pulmonary Disease

## 2024-01-02 ENCOUNTER — Other Ambulatory Visit (HOSPITAL_COMMUNITY): Payer: Self-pay

## 2024-01-02 DIAGNOSIS — I5033 Acute on chronic diastolic (congestive) heart failure: Secondary | ICD-10-CM | POA: Diagnosis not present

## 2024-01-02 LAB — BASIC METABOLIC PANEL WITH GFR
Anion gap: 8 (ref 5–15)
BUN: 33 mg/dL — ABNORMAL HIGH (ref 8–23)
CO2: 26 mmol/L (ref 22–32)
Calcium: 8 mg/dL — ABNORMAL LOW (ref 8.9–10.3)
Chloride: 105 mmol/L (ref 98–111)
Creatinine, Ser: 1.62 mg/dL — ABNORMAL HIGH (ref 0.61–1.24)
GFR, Estimated: 43 mL/min — ABNORMAL LOW (ref >60)
Glucose, Bld: 162 mg/dL — ABNORMAL HIGH (ref 70–99)
Potassium: 3.4 mmol/L — ABNORMAL LOW (ref 3.5–5.1)
Sodium: 139 mmol/L (ref 135–145)

## 2024-01-02 LAB — CULTURE, BODY FLUID W GRAM STAIN -BOTTLE: Culture: NO GROWTH

## 2024-01-02 LAB — GLUCOSE, CAPILLARY
Glucose-Capillary: 121 mg/dL — ABNORMAL HIGH (ref 70–99)
Glucose-Capillary: 213 mg/dL — ABNORMAL HIGH (ref 70–99)

## 2024-01-02 MED ORDER — POTASSIUM CHLORIDE CRYS ER 20 MEQ PO TBCR
40.0000 meq | EXTENDED_RELEASE_TABLET | Freq: Two times a day (BID) | ORAL | Status: DC
  Administered 2024-01-02: 40 meq via ORAL
  Filled 2024-01-02: qty 2

## 2024-01-02 MED ORDER — SPIRONOLACTONE 25 MG PO TABS
25.0000 mg | ORAL_TABLET | Freq: Every day | ORAL | 1 refills | Status: DC
  Filled 2024-01-02: qty 30, 30d supply, fill #0

## 2024-01-02 MED ORDER — FUROSEMIDE 40 MG PO TABS
40.0000 mg | ORAL_TABLET | Freq: Two times a day (BID) | ORAL | 1 refills | Status: DC
  Filled 2024-01-02: qty 60, 30d supply, fill #0

## 2024-01-02 MED ORDER — SPIRONOLACTONE 25 MG PO TABS
25.0000 mg | ORAL_TABLET | Freq: Every day | ORAL | Status: DC
  Administered 2024-01-02: 25 mg via ORAL
  Filled 2024-01-02: qty 1

## 2024-01-02 NOTE — TOC Progression Note (Addendum)
 Transition of Care [facility]) - Progression Note    Patient Details  Name: Bob Warren MRN: [id number] Date of Birth: 1945-10-31  Transition of Care [facility]) CM/SW Contact  Ronni Colace, RN Phone Number: 01/02/2024, 11:48 AM  Clinical Narrative:    Rosanna Comment about [facility] delivery to room, [employer]30 [employer] responded that the order needs to be countersigned prior to delivery to room. Patient is discharged, Dr  Drexel Gentles sent a message 1300 Spoke to Daughter Valinda Gault who will be coming to get him after all. Went over HH/DME with her, the [facility] will be sent to home today. From previous note [facility] will be provider with [facility] for other. Called Odilia Bennett at [facility], she was told that the patient will be switching to [employer] from [employer] , they are indeed taking her for pT OT and RN and telehealth with MD, added aide. Gave kelly his daughters number for set up  she had asked if they could see him this weekend,She states he is set up for Monday, but will call his daughter.  1315 recalled daughter Ms Micael Adas to let her know that [facility] will call her, [facility] will be delivered to home and she will be contacted. Team notified that she will be up to get patient and hear AVS instructions Expected Discharge Plan: Skilled Nursing Facility Barriers to Discharge: Continued Medical Work up  Expected Discharge Plan and Services In-house Referral: Clinical Social Work Discharge Planning Services: CM Consult Post Acute Care Choice: Home Health Living arrangements for the past 2 months: Single Family Home                           HH Arranged: PT, OT           Social Determinants of Health (SDOH) Interventions SDOH Screenings   Food Insecurity: No Food Insecurity (12/28/2023)  Housing: Low Risk  (12/28/2023)  Transportation Needs: No Transportation Needs (12/28/2023)  Recent Concern: Transportation Needs - Unmet Transportation Needs (12/24/2023)   Received from [facility]  Utilities:  Not At Risk (12/28/2023)  Financial Resource Strain: Medium Risk (12/24/2023)   Received from [facility]  Physical Activity: Sufficiently Active (11/27/2022)   Received from [facility]  Social Connections: Moderately Integrated (12/28/2023)  Stress: No Stress Concern Present (11/27/2022)   Received from [facility]  Tobacco Use: Medium Risk (12/28/2023)  Health Literacy: Low Risk  (11/27/2022)   [facility]    Readmission Risk Interventions     No data to display

## 2024-01-02 NOTE — Progress Notes (Signed)
 Physical Therapy Treatment Patient Details Name: Bob Warren MRN: [id number] DOB: 1945/09/15 Today's Date: 01/02/2024   History of Present Illness 78 y.o. male presents to [facility] 12/27/23 with SOB and worsening B LE edema. Concern for acute on chronic HFpEF. PMHx:  chronic HFpEF, myasthenia gravis, PAF, GAVE, chronic bilateral lower extremity edema, hypertension, hyperlipidemia, GERD, type 2 diabetes    PT Comments  Pt greeted with RN present in room preparing to help him to the [facility]. Pt completed bed<>BSC transfer with minA and no AD. He changed from his hospital gown to personal clothes demonstrating fair seated balance and poor standing balance. Pt donned his shirt while seated EOB with set-up assist and no LOB. He required minA to don his pants with PT starting them in sitting and finishing pulling them up in standing while pt held onto RW. Pt ambulated ~34ft using RW within the room. Will continue to follow acutely and advance appropriately.     If plan is discharge home, recommend the following: A little help with walking and/or transfers;A little help with bathing/dressing/bathroom;Assistance with cooking/housework;Assist for transportation;Help with stairs or ramp for entrance   Can travel by private vehicle     Yes  Equipment Recommendations  None recommended by PT    Recommendations for Other Services       Precautions / Restrictions Precautions Precautions: Fall Recall of Precautions/Restrictions: Intact Restrictions Weight Bearing Restrictions Per Provider Order: No     Mobility  Bed Mobility               General bed mobility comments: Not assessed. Pt greeted seated EOB with RN preparing to go to the [facility]. He returned to EOB at the end of session.    Transfers Overall transfer level: Needs assistance Equipment used: None, Rolling walker (2 wheels) Transfers: Bed to chair/wheelchair/BSC, Sit to/from Stand Sit to Stand: Min assist   Step pivot transfers: Min  assist       General transfer comment: Pt transferred to [facility] by pushing up from the bed with BUE support and reached for the arm rests on the [facility] for extra support, no AD d/t pt's urgency. He took increased time to pivot his hips and turn to sit down. Good eccentric control with sitting. Pt stood from lowest bed height using RW. Educated pt on proper hand placement, he opted to maintain BUE support on RW grips and required minA to power up.    Ambulation/Gait Ambulation/Gait assistance: Contact guard assist Gait Distance (Feet): 30 Feet Assistive device: Rolling walker (2 wheels) Gait Pattern/deviations: Step-through pattern, Decreased step length - right, Decreased step length - left, Trunk flexed, Knee flexed in stance - right, Knee flexed in stance - left, Drifts right/left       General Gait Details: Pt ambulated with short slow steps within the room. He demonstrated adequate foot clearence. Pt maintained close position to RW and flex posture. He was slightly unsteady, but had no LOB.   Stairs             Wheelchair Mobility     Tilt Bed    Modified Rankin (Stroke Patients Only)       Balance Overall balance assessment: Needs assistance Sitting-balance support: No upper extremity supported, Feet supported Sitting balance-Leahy Scale: Fair Sitting balance - Comments: Pt sat EOB and donned his shirt without LOB.   Standing balance support: Bilateral upper extremity supported, During functional activity, Reliant on assistive device for balance Standing balance-Leahy Scale: Poor Standing balance comment: Pt stood  and held onto RW while pericare was addressed by PT as well as PT assisting in pulling up his pants. He was dependent on RW during gait.                            Communication Communication Communication: No apparent difficulties  Cognition Arousal: Alert Behavior During Therapy: WFL for tasks assessed/performed   PT - Cognitive impairments:  No apparent impairments                         Following commands: Intact      Cueing Cueing Techniques: Verbal cues  Exercises      General Comments General comments (skin integrity, edema, etc.): VSS on RA      Pertinent Vitals/Pain Pain Assessment Pain Assessment: No/denies pain    Home Living                          Prior Function            PT Goals (current goals can now be found in the care plan section) Acute Rehab PT Goals Patient Stated Goal: Return Home and move around on my own Progress towards PT goals: Progressing toward goals    Frequency    Min 2X/week      PT Plan      Co-evaluation              AM-PAC PT "6 Clicks" Mobility   Outcome Measure  Help needed turning from your back to your side while in a flat bed without using bedrails?: A Little Help needed moving from lying on your back to sitting on the side of a flat bed without using bedrails?: A Little Help needed moving to and from a bed to a chair (including a wheelchair)?: A Lot Help needed standing up from a chair using your arms (e.g., wheelchair or bedside chair)?: A Little Help needed to walk in hospital room?: A Little Help needed climbing 3-5 steps with a railing? : A Lot 6 Click Score: 16    End of Session Equipment Utilized During Treatment: Gait belt Activity Tolerance: Patient tolerated treatment well;Patient limited by fatigue Patient left: in bed;with call bell/phone within reach (sitting EOB) Nurse Communication: Mobility status PT Visit Diagnosis: Unsteadiness on feet (R26.81);Other abnormalities of gait and mobility (R26.89);Muscle weakness (generalized) (M62.81)     Time: 1610-9604 PT Time Calculation (min) (ACUTE ONLY): 20 min  Charges:    $Therapeutic Activity: 8-22 mins PT General Charges $$ ACUTE PT VISIT: 1 Visit                     Glenford Lanes, PT, DPT Acute Rehabilitation Services Office: [phone] Secure Chat  Preferred  Riva Chester 01/02/2024, 2:16 PM

## 2024-01-02 NOTE — Progress Notes (Signed)
 AVS completed for discharge packet and given to RN to review with patient and patients family.

## 2024-01-02 NOTE — Discharge Summary (Signed)
 Physician Discharge Summary  Bob Warren ZOX:[id number] DOB: [birth year] DOA: 12/27/2023  PCP: Darylene Epley, MD  Admit date: 12/27/2023 Discharge date: 01/02/2024  Time spent: 45 minutes  Recommendations for Outpatient Follow-up:  PCP Dr. Adriane Albe in 1 to 2 weeks Please check BMP at follow-up   Discharge Diagnoses:  Principal Problem:   Acute on chronic diastolic CHF (congestive heart failure) (HCC) Decompenstated liver cirrhosis Ascites Portal hypertension   AKI (acute kidney injury) (HCC)   Myasthenia gravis (HCC)   DM (diabetes mellitus) (HCC)   Hypertension   HLD (hyperlipidemia)   Iron deficiency anemia   Pancytopenia (HCC)   Ascites   Cirrhosis (HCC)   Thrombocytopenia (HCC)   Discharge Condition: Improved  Diet recommendation: Sodium, heart healthy, diabetic  Filed Weights   12/31/23 0510 01/01/24 0340 01/02/24 0503  Weight: 91.7 kg 89.7 kg 88.8 kg    History of present illness:  78 y.o. M with myasthenia gravis, not currently on therapy, GIB, cirrhosis, DM, dCHF, HTN, IDA and pAF not on AC who presented with leg swelling and abdominal swelling, progressive over months.  Now limiting mobilityIn the ER, he had ascites and 3+ pitting to the abdomen.  Also noted to have Cr up to 2.4 from baseline 1.  Given Lasix  100 mg IV and admitted for diuresis due to combination CHF and portal hypertension.  Daughter reports he was off his diuretic pill for over a month, because he was confused about whether his diuretic pills were in his pillbox. -Admitted, underwent thoracentesis on 6/1, 8 L drained -Slowly improving with diuresis  Hospital Course:   Acute on chronic diastolic CHF  -2D echo with preserved EF, normal RV -Underwent paracentesis and diuresis with IV Lasix , - weight down 12 LB, appears close to euvolemic,  -Transition to home regimen of Lasix  40 Mg twice daily, added Aldactone - Follow-up with PCP in 1 week, please check BMP at follow-up   AKI (acute kidney  injury) (HCC) Baseline Cr 1.0-1.3, here more than doubled to 2.34 mg/dL.  - Suspect hemodynamically mediated with fluid shifts, large-volume paracentesis - Renal ultrasound unremarkable - Now improving, discontinued ARB   Thrombocytopenia (HCC) Pancytopenia - Secondary to chronic liver disease and splenic sequestration, stable, monitor   Decompensated cirrhosis (HCC) Portal hypertension, ascites INR normal, albumin  low 2s. - Diuretics as above, sp large-volume paracentesis 6/1, 8 L drained, given albumin  -Resume Lasix  and Aldactone at discharge -Follow-up with gastroenterology   Iron deficiency anemia Marked iron defiicency - Resume iron supplement - Stable   HLD (hyperlipidemia) - Hold Lipitor   Hypertension BP normal - Continue diltiazem    DM (diabetes mellitus) (HCC) [name], discontinued metformin on account of mild CKD   H/O Myasthenia gravis (HCC) Not currently on therapy, no clinical signs    Discharge Exam: Vitals:   01/02/24 0743 01/02/24 1123  BP: 130/64 136/69  Pulse: 78 82  Resp: 16 20  Temp: 97.8 F (36.6 C) 98 F (36.7 C)  SpO2: 97% 99%    Gen: Awake, Alert, Oriented X 3,  HEENT: no JVD Lungs: Good air movement bilaterally, CTAB CVS: S1S2/RRR Abd: soft, Non tender, non distended, BS present Extremities: TRACE  edema Skin: no new rashes on exposed skin  Discharge Instructions   Discharge Instructions     Amb Referral to Intravenous Iron Therapy   Complete by: As directed    You have been referred to [facility] Infusion team for IV Iron Infusions. The infusion pharmacy team will reach out to you with appointment  information.    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   Ambulatory referral to Cardiology   Complete by: As directed    Diet - low sodium heart healthy   Complete by: As directed    Diet Carb Modified    Complete by: As directed    Increase activity slowly   Complete by: As directed       Allergies as of 01/02/2024       Reactions   Imuran  [azathioprine ] Other (See Comments)   Abnormal LIver functional    Lisinopril Cough        Medication List     STOP taking these medications    cephALEXin 500 MG capsule Commonly known as: KEFLEX   losartan  50 MG tablet Commonly known as: COZAAR    metFORMIN 850 MG tablet Commonly known as: GLUCOPHAGE       TAKE these medications    atorvastatin  20 MG tablet Commonly known as: LIPITOR Take 20 mg by mouth every evening.   Cholecalciferol  125 MCG (5000 UT) capsule Take 5,000 Units by mouth daily.   diltiazem  120 MG 24 hr capsule Commonly known as: CARDIZEM  CD Take 1 capsule (120 mg total) by mouth daily.   ferrous sulfate 325 (65 FE) MG EC tablet Take 325 mg by mouth in the morning.   FISH OIL PO Take 1 capsule by mouth in the morning.   furosemide  40 MG tablet Commonly known as: LASIX  Take 1 tablet (40 mg total) by mouth 2 (two) times daily.   gabapentin 100 MG capsule Commonly known as: NEURONTIN Take 100 mg by mouth 2 (two) times daily.   insulin  aspart 100 UNIT/ML injection Commonly known as: novoLOG  Inject 4-8 Units into the skin 3 (three) times daily before meals. USE PER SLIDING SCALE, MAX 60 UNITS PER DAY (HOLD FOR BG LESS THAN 200)   insulin  glargine 100 UNIT/ML injection Commonly known as: LANTUS  Inject 0.14 mLs (14 Units total) into the skin at bedtime.   omeprazole 40 MG capsule Commonly known as: PRILOSEC Take 40 mg by mouth every evening.   potassium chloride  SA 20 MEQ tablet Commonly known as: KLOR-CON  M Take 20 mEq by mouth daily.   PROBIOTIC PO Take 1 tablet by mouth daily.   spironolactone 25 MG tablet Commonly known as: ALDACTONE Take 1 tablet (25 mg total) by mouth daily.   traMADol  50 MG tablet Commonly known as: ULTRAM  Take 1 tablet (50 mg total) by mouth every 6 (six) hours as  needed for moderate pain. What changed: when to take this               Durable Medical Equipment  (From admission, onward)           Start     Ordered   12/31/23 1424  For home use only DME Bedside commode  Once       Question:  Patient needs a bedside commode to treat with the following condition  Answer:  Weakness   12/31/23 1423           Allergies  Allergen Reactions   Imuran  [Azathioprine ] Other (See Comments)    Abnormal LIver functional    Lisinopril Cough    Follow-up Information     [facility].   Specialty: Home Health Services Why: for home health services Contact information: [address]  [address] [place] [address] [phone]                  The results of significant diagnostics from this hospitalization (including imaging, microbiology, ancillary and laboratory) are listed below for reference.    Significant Diagnostic Studies: ECHOCARDIOGRAM COMPLETE Result Date: 12/29/2023    ECHOCARDIOGRAM REPORT   Patient Name:   MAREON ROBINETTE Date of Exam: 12/29/2023 Medical Rec #:  [id number]     Height:       67.0 in Accession #:    [id number]    Weight:       208.6 lb Date of Birth:  1946/02/27     BSA:          2.059 m Patient Age:    77 years      BP:           128/70 mmHg Patient Gender: M             HR:           87 bpm. Exam Location:  Inpatient Procedure: 2D Echo, Cardiac Doppler and Color Doppler (Both Spectral and Color            Flow Doppler were utilized during procedure). Indications:    CHF Acute Diastolic I50.31  History:        Patient has no prior history of Echocardiogram examinations.  Sonographer:    Hersey Lorenzo RDCS Referring Phys: [id number] CAROLE N HALL IMPRESSIONS  1. Left ventricular ejection fraction, by estimation, is 60 to 65%. The left ventricle has normal function. The left ventricle has no regional wall motion abnormalities.  2. Right ventricular systolic function is normal. The right ventricular  size is normal.  3. The mitral valve is normal in structure. No evidence of mitral valve regurgitation. No evidence of mitral stenosis.  4. The aortic valve is normal in structure. There is mild thickening of the aortic valve. Aortic valve regurgitation is not visualized. No aortic stenosis is present.  5. Aortic dilatation noted. There is dilatation of the ascending aorta, measuring 39 mm.  6. The inferior vena cava is normal in size with greater than 50% respiratory variability, suggesting right atrial pressure of 3 mmHg. FINDINGS  Left Ventricle: Left ventricular ejection fraction, by estimation, is 60 to 65%. The left ventricle has normal function. The left ventricle has no regional wall motion abnormalities. The left ventricular internal cavity size was normal in size. There is  no left ventricular hypertrophy. Left ventricular diastolic function could not be evaluated due to atrial fibrillation. Right Ventricle: The right ventricular size is normal. No increase in right ventricular wall thickness. Right ventricular systolic function is normal. Left Atrium: Left atrial size was normal in size. Right Atrium: Right atrial size was normal in size. Pericardium: There is no evidence of pericardial effusion. Mitral Valve: The mitral valve is normal in structure. No evidence of mitral valve regurgitation. No evidence of mitral valve stenosis. Tricuspid Valve: The tricuspid valve is normal in structure. Tricuspid valve regurgitation is not demonstrated. No evidence of tricuspid stenosis. Aortic Valve: The aortic valve is normal in structure. There is mild thickening of the aortic valve. Aortic valve regurgitation is not visualized. No aortic stenosis is present. Pulmonic Valve: The pulmonic valve was normal in structure. Pulmonic valve regurgitation is not visualized. No evidence of pulmonic stenosis. Aorta: Aortic dilatation noted. There is dilatation of the ascending aorta, measuring 39 mm. Venous: The inferior vena  cava is normal in size with  greater than 50% respiratory variability, suggesting right atrial pressure of 3 mmHg. IAS/Shunts: No atrial level shunt detected by color flow Doppler.  LEFT VENTRICLE PLAX 2D LVIDd:         3.60 cm   Diastology LVIDs:         2.50 cm   LV e' medial:    7.29 cm/s LV PW:         1.10 cm   LV E/e' medial:  8.2 LV IVS:        1.10 cm   LV e' lateral:   11.30 cm/s LVOT diam:     2.00 cm   LV E/e' lateral: 5.3 LV SV:         45 LV SV Index:   22 LVOT Area:     3.14 cm  RIGHT VENTRICLE RV S prime:     11.40 cm/s TAPSE (M-mode): 2.1 cm LEFT ATRIUM             Index LA diam:        3.50 cm 1.70 cm/m LA Vol (A2C):   42.6 ml 20.69 ml/m LA Vol (A4C):   43.4 ml 21.08 ml/m LA Biplane Vol: 45.6 ml 22.15 ml/m  AORTIC VALVE LVOT Vmax:   86.70 cm/s LVOT Vmean:  58.700 cm/s LVOT VTI:    0.144 m  AORTA Ao Root diam: 3.30 cm Ao Asc diam:  3.90 cm MITRAL VALVE MV Area (PHT): 3.16 cm    SHUNTS MV Decel Time: 240 msec    Systemic VTI:  0.14 m MV E velocity: 59.60 cm/s  Systemic Diam: 2.00 cm MV A velocity: 87.00 cm/s MV E/A ratio:  0.69 Aditya Sabharwal Electronically signed by Alwin Baars Signature Date/Time: 12/29/2023/3:24:47 PM    Final    US  RENAL Result Date: 12/28/2023 CLINICAL DATA:  Acute kidney injury EXAM: RENAL / URINARY TRACT ULTRASOUND COMPLETE COMPARISON:  None Available. FINDINGS: Right Kidney: Renal measurements: 9.1 x 5.1 x 6.3 cm = volume: 166 mL. Echogenicity is within normal limits. No hydronephrosis. There is a central cyst measuring 3.0 x 4.6 x 4.4 cm. There is an inferior pole cyst measuring 2.1 x 2.6 x 1.9 cm. Left Kidney: Not visualized. Bladder: Appears normal for degree of bladder distention. Other: Ascites is visualized in the pelvis. IMPRESSION: 1. No hydronephrosis of the right kidney. 2. The left kidney is not visualized. 3. Ascites in the pelvis. Electronically Signed   By: Tyron Gallon M.D.   On: 12/28/2023 20:30   US  Paracentesis Result Date:  12/28/2023 INDICATION: Patient with a history of heart failure presents today with ascites. Interventional radiology asked to perform a diagnostic and therapeutic paracentesis. EXAM: ULTRASOUND GUIDED PARACENTESIS MEDICATIONS: 1% lidocaine  10 mL COMPLICATIONS: None immediate. PROCEDURE: Informed written consent was obtained from the patient after a discussion of the risks, benefits and alternatives to treatment. A timeout was performed prior to the initiation of the procedure. Initial ultrasound scanning demonstrates a large amount of ascites within the right lower abdominal quadrant. The right lower abdomen was prepped and draped in the usual sterile fashion. 1% lidocaine  was used for local anesthesia. Following this, a 19 gauge, 7-cm, Yueh catheter was introduced. An ultrasound image was saved for documentation purposes. The paracentesis was performed. The catheter was removed and a dressing was applied. The patient tolerated the procedure well without immediate post procedural complication. Patient received post-procedure intravenous albumin ; see nursing notes for details. FINDINGS: A total of approximately 8 L of clear yellow fluid was removed.  Samples were sent to the laboratory as requested by the clinical team. IMPRESSION: Successful ultrasound-guided paracentesis yielding 8 liters of peritoneal fluid. Procedure performed by Jetta Morrow, NP Electronically Signed   By: Elene Griffes M.D.   On: 12/28/2023 19:05   US  ASCITES (ABDOMEN LIMITED) Result Date: 12/28/2023 CLINICAL DATA:  [id number].  Distended abdomen. EXAM: LIMITED ABDOMEN ULTRASOUND FOR ASCITES TECHNIQUE: Limited ultrasound survey for ascites was performed in all four abdominal quadrants. COMPARISON:  Right upper quadrant ultrasound 06/04/2022 FINDINGS: There is moderate 4 quadrant abdominal ascites. The technologist does not measure the pockets, but the deepest imaged pocket is in the right lower quadrant and measures 8 cm depth. Ascites also extends  to the midline where it is up to 6.5 cm in depth. IMPRESSION: Moderate 4 quadrant abdominal ascites. Electronically Signed   By: Denman Fischer M.D.   On: 12/28/2023 06:40   DG Chest 2 View Result Date: 12/27/2023 CLINICAL DATA:  Shortness of breath EXAM: CHEST - 2 VIEW COMPARISON:  01/26/2018 FINDINGS: Shallow inspiration with atelectasis in the lung bases. Heart size and pulmonary vascularity are normal for technique. No focal consolidation or airspace disease in the lungs. No pleural effusions or pneumothorax. Mediastinal contours appear intact. Calcification of the aorta. IMPRESSION: Shallow inspiration with linear atelectasis in the lung bases. No focal consolidation. Electronically Signed   By: Boyce Byes M.D.   On: 12/27/2023 22:08    Microbiology: Recent Results (from the past 240 hours)  Culture, body fluid w Gram Stain-bottle     Status: None   Collection Time: 12/28/23 12:28 PM   Specimen: Peritoneal Washings  Result Value Ref Range Status   Specimen Description PERITONEAL  Final   Special Requests ABDOMEN  Final   Culture   Final    NO GROWTH 5 DAYS Performed at [facility], [address], [place], [place] [address]    Report Status 01/02/2024 FINAL  Final  Gram stain     Status: None   Collection Time: 12/28/23 12:28 PM   Specimen: Peritoneal Washings  Result Value Ref Range Status   Specimen Description PERITONEAL  Final   Special Requests ABDOMEN  Final   Gram Stain   Final    RARE WBC PRESENT, PREDOMINANTLY MONONUCLEAR NO ORGANISMS SEEN Performed at [facility], [address], [place], [place] [address]    Report Status 12/28/2023 FINAL  Final     Labs: Basic Metabolic Panel: Recent Labs  Lab 12/28/23 [phone] 12/28/23 5638 12/29/23 0306 12/30/23 0231 12/31/23 0226 01/01/24 0243 01/02/24 0306  NA  --    < > 134* 138 137 139 139  K  --    < > 4.3 3.6 3.2* 4.0 3.4*  CL  --    < > 106 108 106 107 105  CO2  --    < > 19* 24 24 25 26    GLUCOSE  --    < > 118* 174* 166* 160* 162*  BUN  --    < > 51* 44* 44* 37* 33*  CREATININE  --    < > 2.01* 1.77* 1.58* 1.64* 1.62*  CALCIUM   --    < > 8.0* 8.1* 8.1* 8.1* 8.0*  MG 1.7  --   --   --   --   --   --   PHOS 4.4  --   --   --   --   --   --    < > = values  in this interval not displayed.   Liver Function Tests: Recent Labs  Lab 12/28/23 0624 12/29/23 0306 12/31/23 0226  AST 29 24 50*  ALT 29 21 36  ALKPHOS 47 33* 41  BILITOT 0.6 0.9 0.6  PROT 6.4* 5.3* 5.5*  ALBUMIN  2.2* 2.4* 2.1*   No results for input(s): "LIPASE", "AMYLASE" in the last 168 hours. No results for input(s): "AMMONIA" in the last 168 hours. CBC: Recent Labs  Lab 12/27/23 2131 12/28/23 0623 12/29/23 0735 12/30/23 0231 12/31/23 0226 01/01/24 0243  WBC 8.7 7.9 3.7* 3.9* 5.3 5.4  NEUTROABS 7.0  --   --   --   --   --   HGB 10.8* 9.3* 8.3* 8.3* 8.9* 8.9*  HCT 34.3* 29.2* 25.5* 26.0* 27.7* 27.2*  MCV 88.2 86.6 84.7 85.2 85.0 87.2  PLT 214 143* 75* 87* 94* 101*   Cardiac Enzymes: No results for input(s): "CKTOTAL", "CKMB", "CKMBINDEX", "TROPONINI" in the last 168 hours. BNP: BNP (last 3 results) Recent Labs    12/27/23 2131  BNP 97.3    ProBNP (last 3 results) No results for input(s): "PROBNP" in the last 8760 hours.  CBG: Recent Labs  Lab 01/01/24 1124 01/01/24 1628 01/01/24 2117 01/02/24 0611 01/02/24 1125  GLUCAP 175* 319* 183* 121* 213*       Signed:  Deforest Fast MD.  [facility] 01/02/2024, 11:51 AM

## 2024-01-02 NOTE — Plan of Care (Signed)

## 2024-01-02 NOTE — Care Management (Cosign Needed)
    Durable Medical Equipment  (From admission, onward)           Start     Ordered   12/31/23 1424  For home use only DME Bedside commode  Once       Question:  Patient needs a bedside commode to treat with the following condition  Answer:  Weakness   12/31/23 1423           The patient is confined to one room and needs a Bedside commode

## 2024-01-04 ENCOUNTER — Telehealth: Payer: Self-pay

## 2024-01-04 NOTE — Telephone Encounter (Signed)
 Valinda Gault the patients daughter calling in as they have not received the [facility] ordered. Called Jermaine  from [facility],  and sent him her number to make contact for delivery

## 2024-02-24 ENCOUNTER — Other Ambulatory Visit: Payer: Self-pay

## 2024-02-24 ENCOUNTER — Encounter (HOSPITAL_COMMUNITY): Payer: Self-pay

## 2024-02-24 ENCOUNTER — Inpatient Hospital Stay (HOSPITAL_COMMUNITY)
Admission: EM | Admit: 2024-02-24 | Discharge: 2024-03-08 | DRG: 432 | Disposition: A | Discharge status: Hospice | Attending: Internal Medicine | Admitting: Internal Medicine

## 2024-02-24 ENCOUNTER — Emergency Department (HOSPITAL_COMMUNITY)

## 2024-02-24 DIAGNOSIS — M25551 Pain in right hip: Secondary | ICD-10-CM | POA: Diagnosis present

## 2024-02-24 DIAGNOSIS — I2510 Atherosclerotic heart disease of native coronary artery without angina pectoris: Secondary | ICD-10-CM | POA: Diagnosis present

## 2024-02-24 DIAGNOSIS — K7581 Nonalcoholic steatohepatitis (NASH): Secondary | ICD-10-CM | POA: Diagnosis present

## 2024-02-24 DIAGNOSIS — D72829 Elevated white blood cell count, unspecified: Secondary | ICD-10-CM | POA: Diagnosis present

## 2024-02-24 DIAGNOSIS — I509 Heart failure, unspecified: Secondary | ICD-10-CM

## 2024-02-24 DIAGNOSIS — R195 Other fecal abnormalities: Secondary | ICD-10-CM | POA: Diagnosis present

## 2024-02-24 DIAGNOSIS — Z79899 Other long term (current) drug therapy: Secondary | ICD-10-CM

## 2024-02-24 DIAGNOSIS — D509 Iron deficiency anemia, unspecified: Secondary | ICD-10-CM | POA: Diagnosis present

## 2024-02-24 DIAGNOSIS — K766 Portal hypertension: Secondary | ICD-10-CM | POA: Diagnosis present

## 2024-02-24 DIAGNOSIS — E7800 Pure hypercholesterolemia, unspecified: Secondary | ICD-10-CM | POA: Diagnosis present

## 2024-02-24 DIAGNOSIS — E119 Type 2 diabetes mellitus without complications: Secondary | ICD-10-CM

## 2024-02-24 DIAGNOSIS — G252 Other specified forms of tremor: Secondary | ICD-10-CM | POA: Diagnosis not present

## 2024-02-24 DIAGNOSIS — Z66 Do not resuscitate: Secondary | ICD-10-CM | POA: Diagnosis present

## 2024-02-24 DIAGNOSIS — Z794 Long term (current) use of insulin: Secondary | ICD-10-CM

## 2024-02-24 DIAGNOSIS — K31819 Angiodysplasia of stomach and duodenum without bleeding: Secondary | ICD-10-CM | POA: Diagnosis present

## 2024-02-24 DIAGNOSIS — E876 Hypokalemia: Secondary | ICD-10-CM | POA: Diagnosis not present

## 2024-02-24 DIAGNOSIS — I8510 Secondary esophageal varices without bleeding: Secondary | ICD-10-CM | POA: Diagnosis present

## 2024-02-24 DIAGNOSIS — K7469 Other cirrhosis of liver: Secondary | ICD-10-CM | POA: Diagnosis not present

## 2024-02-24 DIAGNOSIS — Z87442 Personal history of urinary calculi: Secondary | ICD-10-CM

## 2024-02-24 DIAGNOSIS — E8770 Fluid overload, unspecified: Principal | ICD-10-CM | POA: Diagnosis present

## 2024-02-24 DIAGNOSIS — T473X5A Adverse effect of saline and osmotic laxatives, initial encounter: Secondary | ICD-10-CM | POA: Diagnosis not present

## 2024-02-24 DIAGNOSIS — N183 Chronic kidney disease, stage 3 unspecified: Secondary | ICD-10-CM | POA: Diagnosis present

## 2024-02-24 DIAGNOSIS — Z803 Family history of malignant neoplasm of breast: Secondary | ICD-10-CM

## 2024-02-24 DIAGNOSIS — K7460 Unspecified cirrhosis of liver: Secondary | ICD-10-CM | POA: Diagnosis present

## 2024-02-24 DIAGNOSIS — I10 Essential (primary) hypertension: Secondary | ICD-10-CM | POA: Diagnosis present

## 2024-02-24 DIAGNOSIS — Z87891 Personal history of nicotine dependence: Secondary | ICD-10-CM

## 2024-02-24 DIAGNOSIS — K5900 Constipation, unspecified: Secondary | ICD-10-CM | POA: Diagnosis present

## 2024-02-24 DIAGNOSIS — Z8249 Family history of ischemic heart disease and other diseases of the circulatory system: Secondary | ICD-10-CM

## 2024-02-24 DIAGNOSIS — G7000 Myasthenia gravis without (acute) exacerbation: Secondary | ICD-10-CM | POA: Diagnosis present

## 2024-02-24 DIAGNOSIS — M25571 Pain in right ankle and joints of right foot: Secondary | ICD-10-CM | POA: Diagnosis present

## 2024-02-24 DIAGNOSIS — K3189 Other diseases of stomach and duodenum: Secondary | ICD-10-CM | POA: Diagnosis present

## 2024-02-24 DIAGNOSIS — E1122 Type 2 diabetes mellitus with diabetic chronic kidney disease: Secondary | ICD-10-CM | POA: Diagnosis present

## 2024-02-24 DIAGNOSIS — G8929 Other chronic pain: Secondary | ICD-10-CM | POA: Diagnosis present

## 2024-02-24 DIAGNOSIS — Z85828 Personal history of other malignant neoplasm of skin: Secondary | ICD-10-CM

## 2024-02-24 DIAGNOSIS — I8500 Esophageal varices without bleeding: Secondary | ICD-10-CM | POA: Diagnosis present

## 2024-02-24 DIAGNOSIS — Z7984 Long term (current) use of oral hypoglycemic drugs: Secondary | ICD-10-CM

## 2024-02-24 DIAGNOSIS — Z9889 Other specified postprocedural states: Secondary | ICD-10-CM

## 2024-02-24 DIAGNOSIS — Z515 Encounter for palliative care: Secondary | ICD-10-CM

## 2024-02-24 DIAGNOSIS — K7682 Hepatic encephalopathy: Secondary | ICD-10-CM | POA: Diagnosis present

## 2024-02-24 DIAGNOSIS — I5033 Acute on chronic diastolic (congestive) heart failure: Secondary | ICD-10-CM | POA: Diagnosis present

## 2024-02-24 DIAGNOSIS — K30 Functional dyspepsia: Secondary | ICD-10-CM | POA: Diagnosis not present

## 2024-02-24 DIAGNOSIS — Z888 Allergy status to other drugs, medicaments and biological substances status: Secondary | ICD-10-CM

## 2024-02-24 DIAGNOSIS — R278 Other lack of coordination: Secondary | ICD-10-CM | POA: Diagnosis not present

## 2024-02-24 DIAGNOSIS — R188 Other ascites: Secondary | ICD-10-CM | POA: Diagnosis present

## 2024-02-24 DIAGNOSIS — K219 Gastro-esophageal reflux disease without esophagitis: Secondary | ICD-10-CM | POA: Diagnosis present

## 2024-02-24 DIAGNOSIS — I4891 Unspecified atrial fibrillation: Secondary | ICD-10-CM | POA: Insufficient documentation

## 2024-02-24 DIAGNOSIS — D696 Thrombocytopenia, unspecified: Secondary | ICD-10-CM | POA: Diagnosis not present

## 2024-02-24 DIAGNOSIS — I480 Paroxysmal atrial fibrillation: Secondary | ICD-10-CM | POA: Diagnosis present

## 2024-02-24 DIAGNOSIS — Z833 Family history of diabetes mellitus: Secondary | ICD-10-CM

## 2024-02-24 DIAGNOSIS — M545 Low back pain, unspecified: Secondary | ICD-10-CM | POA: Diagnosis present

## 2024-02-24 DIAGNOSIS — E871 Hypo-osmolality and hyponatremia: Secondary | ICD-10-CM | POA: Diagnosis present

## 2024-02-24 DIAGNOSIS — I130 Hypertensive heart and chronic kidney disease with heart failure and stage 1 through stage 4 chronic kidney disease, or unspecified chronic kidney disease: Secondary | ICD-10-CM | POA: Diagnosis present

## 2024-02-24 DIAGNOSIS — I5030 Unspecified diastolic (congestive) heart failure: Secondary | ICD-10-CM | POA: Diagnosis present

## 2024-02-24 LAB — CBC WITH DIFFERENTIAL/PLATELET
Abs Immature Granulocytes: 0.07 K/uL (ref 0.00–0.07)
Basophils Absolute: 0 K/uL (ref 0.0–0.1)
Basophils Relative: 0 %
Eosinophils Absolute: 0 K/uL (ref 0.0–0.5)
Eosinophils Relative: 0 %
HCT: 31.7 % — ABNORMAL LOW (ref 39.0–52.0)
Hemoglobin: 9.9 g/dL — ABNORMAL LOW (ref 13.0–17.0)
Immature Granulocytes: 1 %
Lymphocytes Relative: 8 %
Lymphs Abs: 0.9 K/uL (ref 0.7–4.0)
MCH: 25.6 pg — ABNORMAL LOW (ref 26.0–34.0)
MCHC: 31.2 g/dL (ref 30.0–36.0)
MCV: 82.1 fL (ref 80.0–100.0)
Monocytes Absolute: 0.9 K/uL (ref 0.1–1.0)
Monocytes Relative: 8 %
Neutro Abs: 9.8 K/uL — ABNORMAL HIGH (ref 1.7–7.7)
Neutrophils Relative %: 83 %
Platelets: 233 K/uL (ref 150–400)
RBC: 3.86 MIL/uL — ABNORMAL LOW (ref 4.22–5.81)
RDW: 16.1 % — ABNORMAL HIGH (ref 11.5–15.5)
WBC: 11.7 K/uL — ABNORMAL HIGH (ref 4.0–10.5)
nRBC: 0 % (ref 0.0–0.2)

## 2024-02-24 LAB — COMPREHENSIVE METABOLIC PANEL WITH GFR
ALT: 20 U/L (ref 0–44)
AST: 30 U/L (ref 15–41)
Albumin: 2.2 g/dL — ABNORMAL LOW (ref 3.5–5.0)
Alkaline Phosphatase: 87 U/L (ref 38–126)
Anion gap: 11 (ref 5–15)
BUN: 18 mg/dL (ref 8–23)
CO2: 20 mmol/L — ABNORMAL LOW (ref 22–32)
Calcium: 8.8 mg/dL — ABNORMAL LOW (ref 8.9–10.3)
Chloride: 98 mmol/L (ref 98–111)
Creatinine, Ser: 1.77 mg/dL — ABNORMAL HIGH (ref 0.61–1.24)
GFR, Estimated: 39 mL/min — ABNORMAL LOW (ref >60)
Glucose, Bld: 154 mg/dL — ABNORMAL HIGH (ref 70–99)
Potassium: 5.1 mmol/L (ref 3.5–5.1)
Sodium: 129 mmol/L — ABNORMAL LOW (ref 135–145)
Total Bilirubin: 1.1 mg/dL (ref 0.0–1.2)
Total Protein: 7.2 g/dL (ref 6.5–8.1)

## 2024-02-24 LAB — BRAIN NATRIURETIC PEPTIDE: B Natriuretic Peptide: 179.2 pg/mL — ABNORMAL HIGH (ref 0.0–100.0)

## 2024-02-24 LAB — TROPONIN I (HIGH SENSITIVITY): Troponin I (High Sensitivity): 13 ng/L (ref <18)

## 2024-02-24 MED ORDER — OXYCODONE-ACETAMINOPHEN 5-325 MG PO TABS
1.0000 | ORAL_TABLET | ORAL | Status: DC | PRN
  Administered 2024-02-24: 1 via ORAL
  Filled 2024-02-24: qty 1

## 2024-02-24 NOTE — ED Triage Notes (Signed)
 C/O abd distention for a couple of days. Pt gained 20lbs in the last week. Hx of CHF. Denies CP. EMS gave 80mg  of lasix .

## 2024-02-24 NOTE — ED Provider Triage Note (Signed)
 Emergency Medicine Provider Triage Evaluation Note  Bob Warren , a 78 y.o. male  was evaluated in triage.  Pt complains of abdominal distention and dyspnea.  Abdominal distention and leg swelling has been for months.  A little more short of breath now and having some cough for weeks.  Review of Systems  Positive: Abdominal distention, swelling, shortness of breath Negative: Abdominal pain, chest pain  Physical Exam  BP (!) 141/80 (BP Location: Right Arm)   Pulse 98   Temp 97.6 F (36.4 C) (Oral)   Resp 17   Ht 5' 7 (1.702 m)   Wt 98 kg   SpO2 100%   BMI 33.83 kg/m  Gen:   Awake, no distress   Resp:  Normal effort Other:  Diffuse abdominal distention.  No tenderness  Medical Decision Making  Medically screening exam initiated at 3:16 PM.  Appropriate orders placed.  Bob Warren was informed that the remainder of the evaluation will be completed by another provider, this initial triage assessment does not replace that evaluation, and the importance of remaining in the ED until their evaluation is complete.  Labs, x-ray, EKG ordered   Bob Hamilton, MD 02/24/24 [phone]

## 2024-02-25 ENCOUNTER — Observation Stay (HOSPITAL_COMMUNITY)

## 2024-02-25 DIAGNOSIS — N183 Chronic kidney disease, stage 3 unspecified: Secondary | ICD-10-CM | POA: Diagnosis present

## 2024-02-25 DIAGNOSIS — E8770 Fluid overload, unspecified: Secondary | ICD-10-CM | POA: Diagnosis present

## 2024-02-25 DIAGNOSIS — I5030 Unspecified diastolic (congestive) heart failure: Secondary | ICD-10-CM | POA: Diagnosis present

## 2024-02-25 DIAGNOSIS — I4891 Unspecified atrial fibrillation: Secondary | ICD-10-CM | POA: Insufficient documentation

## 2024-02-25 HISTORY — PX: IR PARACENTESIS: IMG2679

## 2024-02-25 LAB — PROTEIN, PLEURAL OR PERITONEAL FLUID: Total protein, fluid: <3 g/dL

## 2024-02-25 LAB — TROPONIN I (HIGH SENSITIVITY): Troponin I (High Sensitivity): 12 ng/L (ref <18)

## 2024-02-25 LAB — BODY FLUID CELL COUNT WITH DIFFERENTIAL
Eos, Fluid: 0 %
Lymphs, Fluid: 32 %
Monocyte-Macrophage-Serous Fluid: 46 % — ABNORMAL LOW (ref 50–90)
Neutrophil Count, Fluid: 22 % (ref 0–25)
Total Nucleated Cell Count, Fluid: 100 uL (ref 0–1000)

## 2024-02-25 LAB — ALBUMIN, PLEURAL OR PERITONEAL FLUID: Albumin, Fluid: <1.5 g/dL

## 2024-02-25 LAB — GLUCOSE, CAPILLARY
Glucose-Capillary: 212 mg/dL — ABNORMAL HIGH (ref 70–99)
Glucose-Capillary: 199 mg/dL — ABNORMAL HIGH (ref 70–99)

## 2024-02-25 LAB — GLUCOSE, PLEURAL OR PERITONEAL FLUID: Glucose, Fluid: 200 mg/dL

## 2024-02-25 LAB — PROTIME-INR
INR: 1.2 (ref 0.8–1.2)
Prothrombin Time: 16.1 s — ABNORMAL HIGH (ref 11.4–15.2)

## 2024-02-25 MED ORDER — PANTOPRAZOLE SODIUM 40 MG PO TBEC
80.0000 mg | DELAYED_RELEASE_TABLET | Freq: Every day | ORAL | Status: DC
  Administered 2024-02-25 – 2024-03-08 (×14): 80 mg via ORAL
  Filled 2024-02-25 (×13): qty 2

## 2024-02-25 MED ORDER — ACETAMINOPHEN 650 MG RE SUPP: 650.0000 mg | Freq: Four times a day (QID) | RECTAL | Status: DC | PRN

## 2024-02-25 MED ORDER — FUROSEMIDE 10 MG/ML IJ SOLN
60.0000 mg | Freq: Once | INTRAMUSCULAR | Status: AC
  Administered 2024-02-25: 60 mg via INTRAVENOUS
  Filled 2024-02-25: qty 6

## 2024-02-25 MED ORDER — INSULIN ASPART 100 UNIT/ML IJ SOLN
0.0000 [IU] | Freq: Three times a day (TID) | INTRAMUSCULAR | Status: DC
  Administered 2024-02-25: 2 [IU] via SUBCUTANEOUS
  Administered 2024-02-26: 4 [IU] via SUBCUTANEOUS
  Administered 2024-02-26: 1 [IU] via SUBCUTANEOUS
  Administered 2024-02-26: 2 [IU] via SUBCUTANEOUS
  Administered 2024-02-27: 1 [IU] via SUBCUTANEOUS
  Administered 2024-02-27: 2 [IU] via SUBCUTANEOUS
  Administered 2024-02-27: 4 [IU] via SUBCUTANEOUS
  Administered 2024-02-28 (×3): 2 [IU] via SUBCUTANEOUS
  Administered 2024-02-29: 1 [IU] via SUBCUTANEOUS
  Administered 2024-02-29 – 2024-03-01 (×2): 2 [IU] via SUBCUTANEOUS
  Administered 2024-03-01: 4 [IU] via SUBCUTANEOUS
  Administered 2024-03-01: 0.3 [IU] via SUBCUTANEOUS
  Administered 2024-03-02: 1 [IU] via SUBCUTANEOUS
  Administered 2024-03-02: 2 [IU] via SUBCUTANEOUS
  Administered 2024-03-02: 3 [IU] via SUBCUTANEOUS
  Administered 2024-03-03: 2 [IU] via SUBCUTANEOUS
  Administered 2024-03-03 – 2024-03-05 (×3): 1 [IU] via SUBCUTANEOUS
  Administered 2024-03-06: 2 [IU] via SUBCUTANEOUS
  Administered 2024-03-06 – 2024-03-08 (×3): 1 [IU] via SUBCUTANEOUS

## 2024-02-25 MED ORDER — SENNOSIDES-DOCUSATE SODIUM 8.6-50 MG PO TABS: 1.0000 | ORAL_TABLET | Freq: Every evening | ORAL | Status: DC | PRN

## 2024-02-25 MED ORDER — DILTIAZEM HCL ER BEADS 120 MG PO CP24: 120.0000 mg | ORAL_CAPSULE | Freq: Every day | ORAL | Status: DC

## 2024-02-25 MED ORDER — TRAMADOL HCL 50 MG PO TABS
50.0000 mg | ORAL_TABLET | Freq: Once | ORAL | Status: AC
  Administered 2024-02-25: 50 mg via ORAL
  Filled 2024-02-25: qty 1

## 2024-02-25 MED ORDER — LIDOCAINE HCL 1 % IJ SOLN
INTRAMUSCULAR | Status: AC
  Filled 2024-02-25: qty 20

## 2024-02-25 MED ORDER — LIDOCAINE HCL 1 % IJ SOLN
10.0000 mL | Freq: Once | INTRAMUSCULAR | Status: AC
  Administered 2024-02-25: 10 mL via INTRADERMAL

## 2024-02-25 MED ORDER — ALBUMIN HUMAN 25 % IV SOLN
75.0000 g | Freq: Once | INTRAVENOUS | Status: AC
  Administered 2024-02-25: 75 g via INTRAVENOUS
  Filled 2024-02-25: qty 300

## 2024-02-25 MED ORDER — DICLOFENAC SODIUM 1 % EX GEL
2.0000 g | Freq: Four times a day (QID) | CUTANEOUS | Status: DC
  Administered 2024-02-25 – 2024-03-08 (×41): 2 g via TOPICAL
  Filled 2024-02-25: qty 100

## 2024-02-25 MED ORDER — ALBUMIN HUMAN 25 % IV SOLN: 12.5000 g | Freq: Once | INTRAVENOUS | Status: DC

## 2024-02-25 MED ORDER — ACETAMINOPHEN 500 MG PO TABS
500.0000 mg | ORAL_TABLET | Freq: Four times a day (QID) | ORAL | Status: DC | PRN
  Administered 2024-02-26 – 2024-03-05 (×4): 500 mg via ORAL
  Filled 2024-02-25 (×4): qty 1

## 2024-02-25 MED ORDER — ENOXAPARIN SODIUM 40 MG/0.4ML IJ SOSY
40.0000 mg | PREFILLED_SYRINGE | INTRAMUSCULAR | Status: DC
  Administered 2024-02-25 – 2024-03-08 (×13): 40 mg via SUBCUTANEOUS
  Filled 2024-02-25 (×13): qty 0.4

## 2024-02-25 MED ORDER — TRAMADOL HCL 50 MG PO TABS
50.0000 mg | ORAL_TABLET | Freq: Two times a day (BID) | ORAL | Status: DC | PRN
  Administered 2024-02-26 – 2024-03-08 (×13): 50 mg via ORAL
  Filled 2024-02-25 (×15): qty 1

## 2024-02-25 NOTE — ED Provider Notes (Signed)
  EMERGENCY DEPARTMENT AT [facility] Provider Note   CSN: [id number] Arrival date & time: 02/24/24  1403     Patient presents with: Abdominal Pain   Bob Warren is a 78 y.o. male.   Pt is a 78y/o male with hx of myasthenia gravis, not currently on therapy, GIB, cirrhosis, DM, dCHF, HTN, IDA and pAF not on AC who is presenting due to worsening abdominal distention, dyspnea, weight gain and leg swelling.  Patient reports has been gradually worsening over the last few months despite taking his Lasix  twice a day.  He has been trying to stick with a low-sodium diet is much as he can.  Reports that he has gained significant weight recently but does admit to not checking his weight daily.  He reports in the last 2 weeks his legs have become so heavy that his wife is having to help him get into bed and even working with PT and OT he gets very winded.  He does report that in the evening he has developed a cough but he does not cough during the day and it does get a little bit better at night.  He has been more uncomfortable at night also when he tries to sleep.  He denies a fever or productive cough.  He has not had any abdominal pain.  Patient unfortunately has waited for 19 hours and reports that he thinks because he sat in the waiting room so long he is now having significant pain in his right hip and back.  He was given 80 mg of Lasix  by EMS but reports he really has not urinated that much.  Last paracentesis was in May.  The history is provided by the patient.  Abdominal Pain      Prior to Admission medications   Medication Sig Start Date End Date Taking? Authorizing Provider  atorvastatin  (LIPITOR) 20 MG tablet Take 20 mg by mouth every evening. 01/03/20   [provider]  Cholecalciferol  125 MCG (5000 UT) capsule Take 5,000 Units by mouth daily. 05/06/23   [provider]  diltiazem  (CARDIZEM  CD) 120 MG 24 hr capsule Take 1 capsule (120 mg total) by mouth  daily. 02/04/18   Danton Reyes DASEN, MD  ferrous sulfate  325 (65 FE) MG EC tablet Take 325 mg by mouth in the morning.    [provider]  furosemide  (LASIX ) 40 MG tablet Take 1 tablet (40 mg total) by mouth 2 (two) times daily. 01/02/24   Fairy Frames, MD  gabapentin (NEURONTIN) 100 MG capsule Take 100 mg by mouth 2 (two) times daily.    [provider]  insulin  aspart (NOVOLOG ) 100 UNIT/ML injection Inject 4-8 Units into the skin 3 (three) times daily before meals. USE PER SLIDING SCALE, MAX 60 UNITS PER DAY (HOLD FOR BG LESS THAN 200) 01/28/23 01/28/24  [provider]  insulin  glargine (LANTUS ) 100 UNIT/ML injection Inject 0.14 mLs (14 Units total) into the skin at bedtime. 02/03/18   Danton Reyes DASEN, MD  Omega-3 Fatty Acids (FISH OIL PO) Take 1 capsule by mouth in the morning.    [provider]  omeprazole (PRILOSEC) 40 MG capsule Take 40 mg by mouth every evening.    [provider]  potassium chloride  SA (KLOR-CON  M) 20 MEQ tablet Take 20 mEq by mouth daily.    [provider]  Probiotic Product (PROBIOTIC PO) Take 1 tablet by mouth daily.    [provider]  spironolactone  (ALDACTONE ) 25 MG  tablet Take 1 tablet (25 mg total) by mouth daily. 01/02/24   Fairy Frames, MD  traMADol  (ULTRAM ) 50 MG tablet Take 1 tablet (50 mg total) by mouth every 6 (six) hours as needed for moderate pain. Patient taking differently: Take 50 mg by mouth every 8 (eight) hours. 02/03/18   Danton Reyes DASEN, MD    Allergies: Imuran  [azathioprine ] and Lisinopril    Review of Systems  Gastrointestinal:  Positive for abdominal pain.    Updated Vital Signs BP (!) 164/84   Pulse (!) 110   Temp 97.6 F (36.4 C)   Resp 19   Ht 5' 7 (1.702 m)   Wt 98 kg   SpO2 100%   BMI 33.83 kg/m   Physical Exam Vitals and nursing note reviewed.  Constitutional:      General: He is not in acute distress.    Appearance: He is well-developed.  HENT:      Head: Normocephalic and atraumatic.  Eyes:     Conjunctiva/sclera: Conjunctivae normal.     Pupils: Pupils are equal, round, and reactive to light.  Cardiovascular:     Rate and Rhythm: Regular rhythm. Tachycardia present.     Pulses: Normal pulses.     Heart sounds: No murmur heard. Pulmonary:     Effort: Pulmonary effort is normal. No respiratory distress.     Breath sounds: Normal breath sounds. No wheezing or rales.  Abdominal:     General: There is distension.     Palpations: Abdomen is soft. There is fluid wave.     Tenderness: There is no abdominal tenderness. There is no guarding or rebound.     Comments: ascities  Musculoskeletal:        General: No tenderness. Normal range of motion.     Cervical back: Normal range of motion and neck supple.     Right lower leg: Edema present.     Left lower leg: Edema present.     Comments: 3+ pitting edema all the way up into the abdomen.  Anasarca noted  Skin:    General: Skin is warm and dry.     Findings: No erythema or rash.  Neurological:     Mental Status: He is alert and oriented to person, place, and time. Mental status is at baseline.  Psychiatric:        Behavior: Behavior normal.     (all labs ordered are listed, but only abnormal results are displayed) Labs Reviewed  COMPREHENSIVE METABOLIC PANEL WITH GFR - Abnormal; Notable for the following components:      Result Value   Sodium 129 (*)    CO2 20 (*)    Glucose, Bld 154 (*)    Creatinine, Ser 1.77 (*)    Calcium  8.8 (*)    Albumin  2.2 (*)    GFR, Estimated 39 (*)    All other components within normal limits  BRAIN NATRIURETIC PEPTIDE - Abnormal; Notable for the following components:   B Natriuretic Peptide 179.2 (*)    All other components within normal limits  CBC WITH DIFFERENTIAL/PLATELET - Abnormal; Notable for the following components:   WBC 11.7 (*)    RBC 3.86 (*)    Hemoglobin 9.9 (*)    HCT 31.7 (*)    MCH 25.6 (*)    RDW 16.1 (*)    Neutro Abs  9.8 (*)    All other components within normal limits  TROPONIN I (HIGH SENSITIVITY)  TROPONIN I (HIGH SENSITIVITY)  EKG: EKG Interpretation Date/Time:  Tuesday February 24 2024 15:29:17 EDT Ventricular Rate:  99 PR Interval:  224 QRS Duration:  76 QT Interval:  352 QTC Calculation: 451 R Axis:   -10  Text Interpretation: Sinus rhythm with sinus arrhythmia with 1st degree A-V block Cannot rule out Inferior infarct , age undetermined Anterolateral infarct , age undetermined Abnormal ECG Confirmed by Freddi Hamilton [phone]) on 02/25/2024 7:36:17 AM  Radiology: [facility] Chest 2 View Result Date: 02/24/2024 CLINICAL DATA:  Dyspnea and swelling. Abdominal distension for couple of days. 20 pound weight gain over the last week. History of CHF. EXAM: CHEST - 2 VIEW COMPARISON:  12/27/2023 FINDINGS: Shallow inspiration. Heart size and pulmonary vascularity are normal. Linear atelectasis suggested in the lung bases. No airspace disease or consolidation. No pleural effusion or pneumothorax. Mediastinal contours appear intact. Degenerative changes in the shoulders. Calcification of the aorta. IMPRESSION: Shallow inspiration with linear atelectasis in the lung bases. No focal consolidation. Electronically Signed   By: Elsie Gravely M.D.   On: 02/24/2024 16:28     Procedures   Medications Ordered in the ED  oxyCODONE -acetaminophen  (PERCOCET/ROXICET) 5-325 MG per tablet 1 tablet (1 tablet Oral Given 02/24/24 2039)  traMADol  (ULTRAM ) tablet 50 mg (has no administration in time range)                                    Medical Decision Making Amount and/or Complexity of Data Reviewed External Data Reviewed: notes. Labs: ordered. Decision-making details documented in ED Course. Radiology: ordered and independent interpretation performed. Decision-making details documented in ED Course. ECG/medicine tests: ordered and independent interpretation performed. Decision-making details documented in ED  Course.  Risk Prescription drug management. Decision regarding hospitalization.   Pt with multiple medical problems and comorbidities and presenting today with a complaint that caries a high risk for morbidity and mortality.  Here today with the above complaints.  Patient appears significantly fluid overloaded with anasarca today.  This has been gradually worsening per patient at least over the last 2 weeks or more.  He reports that as far as he knows he has been compliant with his Lasix  which he takes twice a day which is 40 mg.  Also appears that patient is on spironolactone .  He does report a cough that is present in the evenings only but does not have infectious symptoms associated with it.  Low suspicion for SBP, underlying infection.  Concern for exacerbation of cirrhosis and CHF.  Patient's last echo last month showed a preserved EF. I independently interpreted patient's labs and EKG.  CBC with mild leukocytosis of 11 with a hemoglobin that stable at 9.9 and normal platelet count, BNP is elevated at 179 today from 90s the last time he was here for fluid overload, CMP with a new hyponatremia of 129 which he is typically in the 130s and mildly worsening creatinine of 1.7 which is most recent baseline has been between 1.3-1.6.  Albumin  is low at 2.2 troponin x 2 is negative.  EKG without acute changes today.I have independently visualized and interpreted pt's images today.  Chest x-ray without acute findings today.  Due to patient's significant fluid overload which feel is probably more related to cirrhosis feel that he needs admission for diuresis as well as most likely large-volume paracentesis. Patient has already received 80 mg of IV Lasix  almost 20 hours ago we will give him another dose.  Consulted the  unassigned medicine for admission.  Patient and family are comfortable with this plan.     Final diagnoses:  Hypervolemia, unspecified hypervolemia type  Cirrhosis of liver with ascites,  unspecified hepatic cirrhosis type (HCC)  Acute on chronic congestive heart failure, unspecified heart failure type [facility])    ED Discharge Orders     None          Doretha Folks, MD 02/25/24 [phone]

## 2024-02-25 NOTE — Plan of Care (Signed)

## 2024-02-25 NOTE — ED Notes (Signed)
 ED Provider at bedside.

## 2024-02-25 NOTE — H&P (Cosign Needed Addendum)
 Date: 02/25/2024               Patient Name:  Bob Warren MRN: [id number]  DOB: 1946-03-28 Age / Sex: 78 y.o., male   PCP: Rosan Mix, MD         Medical Service: Internal Medicine Teaching Service         Attending Physician: Dr. Karna Fellows, MD      First Contact: Lamonte Penning, DO     Pager:  [phone]  Second Contact: Dr. Fairy Pool, DO   Pager:  [phone]       After Hours  (After 5pm / First Contact Pager: [phone]  weekends / holidays): Second Contact Pager: [phone]   SUBJECTIVE   Chief Complaint: abdominal distention and leg swelling  History of Present Illness: Decarlos Empey is a 78 y.o. 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  Patient presents to the ED for shortness of breath associated with abdominal distention and bilateral leg swelling.  States this has been worsening over the past few days but has been a chronic issue.  Endorses a nonproductive cough for few weeks.  Denies any fever, chills, abdominal pain, confusion.  Endorses nausea and had 1 episode of nonbilious nonbloody bloody emesis yesterday morning reported as clear.  No further episodes of vomiting.  Reports able to tolerate p.o. intake but appetite has been poor.  Denies any trouble sleeping or insomnia recently.  Denies any infectious/sick symptoms or recent sick contacts. Only endorses some right hip pain that started after sitting in the chair in the ED waiting room.  Denies any acute pains otherwise. Reports adherence to Lasix  40 mg twice a day and spironolactone . Denies any signs of bleeding including hematemesis, hematochezia or melena. Denies urinary symptoms.  Last bowel movement was yesterday and reports regular bowel movements daily.  ED Course: Vitals were BP 136/85, HR 101, temp 97.7, saturating well on room air at 98% Labs significant for sodium 129, creatinine 1.77, BNP 179, mild leukocytosis  11.7, anemia with hemoglobin 9.9 Imaging CXR without focal consolidation with basilar atelectasis Received IV Lasix  60 mg  Meds:  Reports adherence and last taken yesterday   - Atorvastatin  20 mg (unclear if taking) - Diltiazem  120 mg daily Y - Iron supplement Y - Vitamin D  supplement Y - Lasix  40 mg twice daily Y - Gabapentin 100 mg twice daily (stopped for past week due to side effects) - Lantus  14 units nightly Y - NovoLog  4-8 units 3 times daily Y - Omeprazole 40 mg daily Y - Potassium supplement Y - Spironolactone  25 mg daily Y - Tramadol  50 mg Q8H as needed (usually once a day)  Past Medical History Past Medical History:  Diagnosis Date   Arthritis    Diabetes (HCC)    High cholesterol    Hypertension    Kidney stone    Myasthenia gravis [facility])     Past Surgical History Past Surgical History:  Procedure Laterality Date   COLONOSCOPY  01/09/2021   Dr.Raghid Donnise [facility]. Poor prep.   ESOPHAGOGASTRODUODENOSCOPY N/A 01/14/2018   Procedure: ESOPHAGOGASTRODUODENOSCOPY (EGD);  Surgeon: Janalyn Keene NOVAK, MD;  Location: [facility] ENDOSCOPY;  Service: Endoscopy;  Laterality: N/A;   ESOPHAGOGASTRODUODENOSCOPY  01/09/2021   Dr.Raghid Donnise, [facility]. Hiatal Hernia. Findings concerning for nodular Barrett's disease. Biopsied. 2cm submucosal gastric body mass. Reosive gastritis, Biopsied.   ESOPHAGOGASTRODUODENOSCOPY N/A 01/16/2023   Procedure: ESOPHAGOGASTRODUODENOSCOPY (EGD);  Surgeon: Federico Rosario BROCKS, MD;  Location: [facility] ENDOSCOPY;  Service: Gastroenterology;  Laterality: N/A;   ESOPHAGOGASTRODUODENOSCOPY (EGD) WITH PROPOFOL  N/A 06/10/2022   Procedure: ESOPHAGOGASTRODUODENOSCOPY (EGD) WITH PROPOFOL ;  Surgeon: San Sandor GAILS, DO;  Location: [facility] ENDOSCOPY;  Service: Gastroenterology;  Laterality: N/A;   ESOPHAGOGASTRODUODENOSCOPY (EGD) WITH PROPOFOL  N/A 02/20/2023   Procedure: ESOPHAGOGASTRODUODENOSCOPY (EGD) WITH PROPOFOL ;  Surgeon: San Sandor GAILS, DO;  Location: [facility]  ENDOSCOPY;  Service: Gastroenterology;  Laterality: N/A;   GASTRIC VARICES BANDING  01/16/2023   Procedure: GASTRIC GAVE BANDING;  Surgeon: Federico Rosario BROCKS, MD;  Location: [facility] ENDOSCOPY;  Service: Gastroenterology;;   GASTRIC VARICES BANDING  02/20/2023   Procedure: GASTRIC VARICES BANDING;  Surgeon: San Sandor GAILS, DO;  Location: [facility] ENDOSCOPY;  Service: Gastroenterology;;   HOT HEMOSTASIS N/A 06/10/2022   Procedure: HOT HEMOSTASIS (ARGON PLASMA COAGULATION/BICAP);  Surgeon: San Sandor GAILS, DO;  Location: [facility] ENDOSCOPY;  Service: Gastroenterology;  Laterality: N/A;   SKIN CANCER EXCISION Right 2016   Arm    Social:  Lives With: wife Support: family Level of Function: dependent with ADL, has rollator  PCP: Rosan Mix, MD and [facility]  Substances: -Tobacco: former, quit 20 years (1 ppd x 20 years) -Alcohol: denies (endorses alcohol use when in [employer]) -Recreational Drug: denies  Family History:  Family History  Problem Relation Age of Onset   Breast cancer Mother    Other Father        Gunshot wound   Diabetes Maternal Grandfather    Heart disease Maternal Grandfather    Colon cancer Neg Hx    Esophageal cancer Neg Hx    Stomach cancer Neg Hx     Allergies: Allergies as of 02/24/2024 - Review Complete 02/24/2024  Allergen Reaction Noted   Imuran  [azathioprine ] Other (See Comments) 01/19/2018   Lisinopril Cough 03/15/2013    Review of Systems: A complete ROS was negative except as per HPI.   OBJECTIVE:   Physical Exam: Blood pressure (!) 164/84, pulse (!) 110, temperature 97.6 F (36.4 C), resp. rate 19, height 5' 7 (1.702 m), weight 98 kg, SpO2 100% on RA.  Constitutional: Alert, laying in bed comfortably, in no acute distress Cardiovascular: Regular rate and rhythm, palpable DP pulses bilaterally Pulmonary/Chest: Normal work of breathing on room air, lungs clear to auscultation bilaterally, no wheezing or crackles Abdominal: bowel sounds present,  abdomen is distended, nontender, no guarding, fluid wave present MSK: Chronic pain of R hip which limits ROM but able to do ROM, bilateral LE edema Neurological: alert & oriented x 3 to self, place and time, no asterixis, no focal deficits  Labs: CBC    Component Value Date/Time   WBC 11.7 (H) 02/24/2024 1512   RBC 3.86 (L) 02/24/2024 1512   HGB 9.9 (L) 02/24/2024 1512   HGB 12.1 (L) 07/03/2017 1253   HCT 31.7 (L) 02/24/2024 1512   HCT 35.7 (L) 07/03/2017 1253   PLT 233 02/24/2024 1512   PLT 167 07/03/2017 1253   MCV 82.1 02/24/2024 1512   MCV 109 (H) 07/03/2017 1253   MCH 25.6 (L) 02/24/2024 1512   MCHC 31.2 02/24/2024 1512   RDW 16.1 (H) 02/24/2024 1512   RDW 23.1 (H) 07/03/2017 1253   LYMPHSABS 0.9 02/24/2024 1512   LYMPHSABS 0.9 07/03/2017 1253   MONOABS 0.9 02/24/2024 1512   EOSABS 0.0 02/24/2024 1512   EOSABS 0.1 07/03/2017 1253   BASOSABS 0.0 02/24/2024 1512   BASOSABS 0.1 07/03/2017 1253     CMP  Component Value Date/Time   NA 129 (L) 02/24/2024 1512   NA 140 07/03/2017 1253   K 5.1 02/24/2024 1512   CL 98 02/24/2024 1512   CO2 20 (L) 02/24/2024 1512   GLUCOSE 154 (H) 02/24/2024 1512   BUN 18 02/24/2024 1512   BUN 22 07/03/2017 1253   CREATININE 1.77 (H) 02/24/2024 1512   CALCIUM  8.8 (L) 02/24/2024 1512   PROT 7.2 02/24/2024 1512   PROT 6.2 07/03/2017 1253   ALBUMIN  2.2 (L) 02/24/2024 1512   ALBUMIN  2.8 (L) 07/03/2017 1253   AST 30 02/24/2024 1512   ALT 20 02/24/2024 1512   ALKPHOS 87 02/24/2024 1512   BILITOT 1.1 02/24/2024 1512   BILITOT 5.9 (H) 07/03/2017 1253   GFRNONAA 39 (L) 02/24/2024 1512   GFRAA 43 (L) 02/03/2018 0437    Imaging:  US  ASCITES (ABDOMEN LIMITED) CLINICAL DATA:  78 year old male with ascites, heart failure. Status post ultrasound-guided paracentesis on 12/28/2023.  EXAM: LIMITED ABDOMEN ULTRASOUND FOR ASCITES  TECHNIQUE: Limited ultrasound survey for ascites was performed in all four abdominal  quadrants.  COMPARISON:  12/28/2023 ultrasound.  FINDINGS: Grayscale images of the 4 quadrants demonstrate large volume recurrent ascites with simple appearing fluid density (image 11). Volume appears similar to that prior to the June paracentesis.  IMPRESSION: Recurrent ascites since June paracentesis, Large volume of similar to prior.  Electronically Signed   By: VEAR Hurst M.D.   On: 02/25/2024 12:44   EKG: personally reviewed my interpretation is sinus rhythm, rate 99, history of first-degree AV block.  No significant changes compared to prior EKG.  ASSESSMENT & PLAN:   Assessment & Plan by Problem: Principal Problem:   Hypervolemia Active Problems:   Myasthenia gravis (HCC)   DM (diabetes mellitus) (HCC)   Hypertension   Esophageal varices without bleeding (HCC)   GAVE (gastric antral vascular ectasia)   Portal hypertensive gastropathy (HCC)   Ascites   Decompensated cirrhosis (HCC)   (HFpEF) heart failure with preserved ejection fraction (HCC)   Atrial fibrillation (HCC)   CKD (chronic kidney disease) stage 3, GFR 30-59 ml/min (HCC)   Bob Warren is a 78 y.o. 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  Was admitted and May/June for ascites and HFpEF.  Received paracentesis during last admission, no signs of infection or malignant cells.  No signs of bleeding at this time.  Renal function roughly the same compared to at time of discharge from June.  Ultrasound abdomen showed large volume ascites.  Exam was nontender, afebrile so less  suspicious for SBP. A&O x 3 without current signs for encephalopathy. Weight at last discharge was 195 lb on 6/6. - Received IV Lasix  60 mg today in ED, reassess volume status tomorrow to dose diuresis - IR consulted for paracentesis, labs ordered, if large volume will need albumin  today  - Strict I&Os, daily weights  - Trend BMP and CBC - No signs of SBP at this time, if change or c/f bleed then start CTX  #Hyponatremia  Na 129, ?hypervolemia. Alert and oriented. Volume overloaded, getting IV diuresis will monitor Na level. If no improvement then  obtain urine studies.  - Trend BMP   #CKD 3 Scr 1.77 with GFR 39 on admission, last Scr at discharge in June was 1.62 with GFR 43. Appears baseline prior to that was 1.1-1.3 in 2024. - Trend renal function   #T2DM, controlled Last A1c 6.5 in June.  Noted at last hospitalization metformin was discontinued from home med list.   -CBG monitoring -SSI-VS AC  #GAVE #IDA Hemoglobin on admission 9.9 which is improved from discharge in June.  Patient denies any signs of GI bleed at this time.  Has been taking his iron supplement. - Monitor CBC and signs of bleeding - If concerns of GI bleed will need to start ceftriaxone  #Myasthenia gravis Chronic.  No acute flares.  Not currently on therapy.  #HTN #HLD BP normotensive on arrival, mildly hypertensive.  Receiving IV Lasix  at this time.  PTA on spironolactone  25 mg, Lasix  40 mg twice daily along with diltiazem  120 mg. -Continue with diuresis with IV Lasix  -Hold spironolactone  given normotensive readings -Hold home atorvastatin   #Chronic back pain #Chronic right hip pain Chronic. CT hip in 2024 showed severe right hip degenerative changes. PTA on tramadol  50 mg usually taken once to twice a day as needed.  - Voltaren  gel, heating pad - Home Tramadol  50 mg BID PRN   Diet: HH/CM VTE ppx: Enoxaparin  IVF: None Abx: none  Code Status: Full (discussed/confirmed with patient) Surrogate  Decision Maker: wife  Prior to Admission Living Arrangement: Home, living with wife Anticipated Discharge Location: pending Barriers to Discharge: volume status  Dispo: Admit patient to Observation with expected length of stay less than 2 midnights.  Signed: Elicia Sharper, DO Internal Medicine Resident PGY-3 02/25/2024, 1:48 PM   Please contact IM Residency On-Call Pager at: [phone] or [phone].

## 2024-02-25 NOTE — Care Management Obs Status (Signed)
 MEDICARE OBSERVATION STATUS NOTIFICATION   Patient Details  Name: Bob Warren MRN: [id number] Date of Birth: 1946-06-19   Medicare Observation Status Notification Given:  Yes Obs signed and copy given   Claretta Deed 02/25/2024, 4:06 PM

## 2024-02-25 NOTE — Procedures (Signed)
 Ultrasound-guided therapeutic paracentesis performed yielding 9.4 liters of straw colored fluid.No immediate complications. EBL is none.

## 2024-02-25 NOTE — Hospital Course (Addendum)
 Bob Warren is a 78 y.o. 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  #Volume overload #Decompensated cirrhosis suspect from [facility] #Hx of Esophageal varices #HFpEF (60-65%) Presented with recurrent ascites and bilateral LE edema.  Was admitted and May/June for ascites and HFpEF.  Received paracentesis during last admission, no signs of infection or malignant cells.  No signs of bleeding during this hospitalization, but reported dark stools 2 days prior to his admission. Labs notable for hyponatremia of 129, BNP 179, leukocytosis of 11.7, stable kidney function and normocytic anemia, normal platelet count, negative troponin x2. CXR appears relatively unremarkable. EKG with prolonged PR interval, SR Renal function roughly the same compared to at time of discharge from June.  Ultrasound abdomen showed large volume ascites.  Exam was nontender, afebrile so was less suspicious for SBP. A&O x 3 without current signs for encephalopathy. Weight at last discharge was 88.8kg on 6/6. Weight on admission here 98 kg. His hypervolemia 2/2 decompensated cirrhosis and a history of HFpEF, with most recent TTE in June without change. No history of HE noted on chart review. Underwent paracentesis with removal of 9.4L. Received albumin . UOP and weight following IV Lasix  and paracentesis improved. Refractory ascites discussed with GI and consideration of TIPS. Not recommending TIPS at this time since the patient is not currently requiring frequent (1-2x/month) therapeutic paracentesis despite diuretics. Discussed with patient's step-daughter/primary caregiver the need for more regular therapeutic paracentesis every 6 weeks to avoid large volumes being removed at once, which can further stress the kidneys.  The patient and his family  opted to consult palliative care regarding the patient's options for continued treatment or hospice care.  After a difficult and thoughtful discussion, the patient and his family will discharge to home with [facility] [place]. IR was able to place a PleurX catheter and perform another therapeutic paracentesis for recurrent ascites, providing relief. He was discharged on Lasix  40 mg twice daily and spironolactone  50 mg daily with GI follow-up scheduled.  Recommended to repeat metabolic panel in 2 weeks.  #Altered Mental Status Patient became altered 8/2 with decreased attention, delayed responses, and asterixis on exam. High suspicion for hepatic encephalopathy. Lactulose  20g Q2H started until BM.  Patient was able to achieve this goal for 1 day, then was placed on lactulose  10 g 3 times daily.  He sustained this goal for another day before he needed to be uptitrated back to lactulose  20 g 3 times daily.  After not being able to meet his goal, he was uptitrated to 30 g 4 times a day.  His bowel movements were closely monitored.   #Hyponatremia  Arrived hyponatremic at 129 due to volume overload which improved after diuresis.  Sodium level at discharge is 135.  #CKD 3 Serum creatinine remained at baseline during admission.  1.49 on discharge.   #T2DM, controlled Last A1c 6.5 in June.  Noted at last hospitalization metformin was discontinued from home med list. Blood sugars were high here from 250-350. Added Semglee  15 units daily, and monitored for CBG  > 180. Added 4 units of meal time insulin  to cover elevated CBGs. CBG on discharge was 146.    #GAVE #IDA Hemoglobin on admission 9.9 which is improved from discharge in June.  No active bleeding during admission.  8.9 on discharge.   #Myasthenia gravis Chronic.  No acute flares.  Not currently on therapy.   #HTN #HLD Remained largely normotensive even with the addition of spironolactone  and Lasix  as above.  Continued on home diltiazem .   Stopped atorvastatin  in the setting of decompensated cirrhosis.   #Chronic back pain #Chronic right hip pain Chronic. CT hip in 2024 showed severe right hip degenerative changes. PTA on tramadol  50 mg usually taken once to twice a day as needed. Voltaren  gel, heating pad given for symptomatic management. Added lidocaine  patch for chronic back pain.

## 2024-02-26 DIAGNOSIS — N183 Chronic kidney disease, stage 3 unspecified: Secondary | ICD-10-CM

## 2024-02-26 DIAGNOSIS — K7581 Nonalcoholic steatohepatitis (NASH): Secondary | ICD-10-CM

## 2024-02-26 DIAGNOSIS — K7460 Unspecified cirrhosis of liver: Secondary | ICD-10-CM | POA: Diagnosis not present

## 2024-02-26 DIAGNOSIS — D509 Iron deficiency anemia, unspecified: Secondary | ICD-10-CM

## 2024-02-26 DIAGNOSIS — I5032 Chronic diastolic (congestive) heart failure: Secondary | ICD-10-CM | POA: Diagnosis not present

## 2024-02-26 DIAGNOSIS — R188 Other ascites: Secondary | ICD-10-CM | POA: Diagnosis not present

## 2024-02-26 LAB — COMPREHENSIVE METABOLIC PANEL WITH GFR
ALT: 13 U/L (ref 0–44)
AST: 16 U/L (ref 15–41)
Albumin: 2.7 g/dL — ABNORMAL LOW (ref 3.5–5.0)
Alkaline Phosphatase: 52 U/L (ref 38–126)
Anion gap: 10 (ref 5–15)
BUN: 28 mg/dL — ABNORMAL HIGH (ref 8–23)
CO2: 23 mmol/L (ref 22–32)
Calcium: 8.7 mg/dL — ABNORMAL LOW (ref 8.9–10.3)
Chloride: 100 mmol/L (ref 98–111)
Creatinine, Ser: 1.79 mg/dL — ABNORMAL HIGH (ref 0.61–1.24)
GFR, Estimated: 39 mL/min — ABNORMAL LOW (ref >60)
Glucose, Bld: 195 mg/dL — ABNORMAL HIGH (ref 70–99)
Potassium: 4.6 mmol/L (ref 3.5–5.1)
Sodium: 133 mmol/L — ABNORMAL LOW (ref 135–145)
Total Bilirubin: 0.8 mg/dL (ref 0.0–1.2)
Total Protein: 5.6 g/dL — ABNORMAL LOW (ref 6.5–8.1)

## 2024-02-26 LAB — GLUCOSE, CAPILLARY
Glucose-Capillary: 162 mg/dL — ABNORMAL HIGH (ref 70–99)
Glucose-Capillary: 236 mg/dL — ABNORMAL HIGH (ref 70–99)
Glucose-Capillary: 314 mg/dL — ABNORMAL HIGH (ref 70–99)
Glucose-Capillary: 304 mg/dL — ABNORMAL HIGH (ref 70–99)

## 2024-02-26 LAB — CBC
HCT: 24.7 % — ABNORMAL LOW (ref 39.0–52.0)
HCT: 30 % — ABNORMAL LOW (ref 39.0–52.0)
Hemoglobin: 7.6 g/dL — ABNORMAL LOW (ref 13.0–17.0)
Hemoglobin: 9.5 g/dL — ABNORMAL LOW (ref 13.0–17.0)
MCH: 25.2 pg — ABNORMAL LOW (ref 26.0–34.0)
MCH: 25.5 pg — ABNORMAL LOW (ref 26.0–34.0)
MCHC: 30.8 g/dL (ref 30.0–36.0)
MCHC: 31.7 g/dL (ref 30.0–36.0)
MCV: 81.8 fL (ref 80.0–100.0)
MCV: 80.6 fL (ref 80.0–100.0)
Platelets: 103 K/uL — ABNORMAL LOW (ref 150–400)
Platelets: 121 K/uL — ABNORMAL LOW (ref 150–400)
RBC: 3.02 MIL/uL — ABNORMAL LOW (ref 4.22–5.81)
RBC: 3.72 MIL/uL — ABNORMAL LOW (ref 4.22–5.81)
RDW: 16.1 % — ABNORMAL HIGH (ref 11.5–15.5)
RDW: 16 % — ABNORMAL HIGH (ref 11.5–15.5)
WBC: 5.3 K/uL (ref 4.0–10.5)
WBC: 6.5 K/uL (ref 4.0–10.5)
nRBC: 0 % (ref 0.0–0.2)
nRBC: 0 % (ref 0.0–0.2)

## 2024-02-26 LAB — PROTEIN / CREATININE RATIO, URINE
Creatinine, Urine: 29 mg/dL
Total Protein, Urine: <6 mg/dL

## 2024-02-26 LAB — PH, BODY FLUID: pH, Body Fluid: 7.7

## 2024-02-26 LAB — MAGNESIUM: Magnesium: 1.8 mg/dL (ref 1.7–2.4)

## 2024-02-26 MED ORDER — POLYETHYLENE GLYCOL 3350 17 G PO PACK
17.0000 g | PACK | Freq: Every day | ORAL | Status: DC | PRN
  Administered 2024-02-26: 17 g via ORAL
  Filled 2024-02-26: qty 1

## 2024-02-26 MED ORDER — FUROSEMIDE 10 MG/ML IJ SOLN
80.0000 mg | Freq: Once | INTRAMUSCULAR | Status: AC
  Administered 2024-02-26: 80 mg via INTRAVENOUS
  Filled 2024-02-26: qty 8

## 2024-02-26 MED ORDER — DILTIAZEM HCL ER COATED BEADS 120 MG PO CP24
120.0000 mg | ORAL_CAPSULE | Freq: Every day | ORAL | Status: DC
  Administered 2024-02-26 – 2024-03-08 (×13): 120 mg via ORAL
  Filled 2024-02-26 (×12): qty 1

## 2024-02-26 MED ORDER — SENNOSIDES-DOCUSATE SODIUM 8.6-50 MG PO TABS
1.0000 | ORAL_TABLET | Freq: Every day | ORAL | Status: DC
  Administered 2024-02-26 – 2024-03-08 (×12): 1 via ORAL
  Filled 2024-02-26 (×12): qty 1

## 2024-02-26 NOTE — Plan of Care (Signed)

## 2024-02-26 NOTE — Evaluation (Signed)
 Physical Therapy Evaluation Patient Details Name: Bob Warren MRN: [id number] DOB: [birth year] Today's Date: 02/26/2024  History of Present Illness  Pt is a 78 y/o male presenting with SOB, abdominal distension and BLE edema. Admitted for volume overload, decompensated cirrhosis and hyponatremia. Paracentesis 7/30 removing 9.4L. PMH: chronic HFpEF, myasthenia gravis, PAF, GAVE, chronic bilateral lower extremity edema, hypertension, hyperlipidemia, GERD, type 2 diabetes, cirrhosis d/t MASH  Clinical Impression  PTA pt living with wife in single story home with level entry. Pt ambulated with Rollator and able to perform most self care, wife assists with LB dressing. Pt is currently limited in safe mobility by increased LE edema, in presence of generalized weakness and decreased balance. Pt is min A for bed mobility, and transfers from low surface, and CGA progressing to supervision for ambulation in room. Pt is current with HHPT. PT recommending resumption of HHPT at discharge. PT will continue to follow acutely and will refer to Mobility Specialist.         If plan is discharge home, recommend the following: A little help with walking and/or transfers;A little help with bathing/dressing/bathroom;Assistance with cooking/housework;Assist for transportation;Help with stairs or ramp for entrance   Can travel by private vehicle    Yes    Equipment Recommendations None recommended by PT     Functional Status Assessment Patient has had a recent decline in their functional status and demonstrates the ability to make significant improvements in function in a reasonable and predictable amount of time.     Precautions / Restrictions Precautions Precautions: Fall Precaution/Restrictions Comments: fall over a year ago Restrictions Weight Bearing Restrictions Per Provider Order: No      Mobility  Bed Mobility Overal bed mobility: Needs Assistance Bed Mobility: Supine to Sit, Sit to Supine      Supine to sit: Min assist, HOB elevated, Used rails     General bed mobility comments: Min A to lift trunk with handheld assist, use of bed rails and increased time.    Transfers Overall transfer level: Needs assistance Equipment used: Rolling walker (2 wheels) Transfers: Sit to/from Stand Sit to Stand: Min assist           General transfer comment: increased time and effort and min A for power up to RW, supervision for power up from [facility] over toilet    Ambulation/Gait Ambulation/Gait assistance: Contact guard assist, Supervision Gait Distance (Feet): 12 Feet (+15) Assistive device: Rolling walker (2 wheels) Gait Pattern/deviations: Step-through pattern, Trunk flexed, Decreased step length - right, Decreased step length - left       General Gait Details: CGA progressing to supervision with distance, pt utilizes Rollator at baseline so constant cues for upright posture and proximity to RW       Balance Overall balance assessment: Needs assistance Sitting-balance support: No upper extremity supported, Feet supported Sitting balance-Leahy Scale: Good     Standing balance support: Bilateral upper extremity supported, During functional activity, Single extremity supported Standing balance-Leahy Scale: Poor Standing balance comment: reliant on BUE for mobility and one UE for static standing at sink during ADLs                             Pertinent Vitals/Pain  No/denies pain     Home Living Family/patient expects to be discharged to:: Private residence Living Arrangements: Spouse/significant other Available Help at Discharge: Family;Available 24 hours/day Type of Home: House Home Access: Ramped entrance  Home Layout: One level Home Equipment: Grab bars - tub/shower;Rolling Walker (2 wheels);Wheelchair - manual;Tub bench;Grab bars - toilet;Shower seat;Rollator (4 wheels)      Prior Function Prior Level of Function : Needs assist              Mobility Comments: reports able to transfer out of bed and mobilize with Rollator. reports some recent difficulty due to LE swelling. Has been working with HHPT ADLs Comments: Reports able to bathe and toilet self. Reports wife assists with LB dressing and manages IADLs. Daughter in law assists for transportation for pt and spouse     Extremity/Trunk Assessment   Upper Extremity Assessment Upper Extremity Assessment: Defer to OT evaluation    Lower Extremity Assessment Lower Extremity Assessment: RLE deficits/detail;LLE deficits/detail RLE Deficits / Details: increased edema limiting ROM, strength grossly 2+/5 LLE Deficits / Details: increased edema limiting ROM, strength grossly 2+/5    Cervical / Trunk Assessment Cervical / Trunk Assessment: Normal  Communication   Communication Communication: No apparent difficulties    Cognition Arousal: Alert Behavior During Therapy: WFL for tasks assessed/performed                             Following commands: Intact       Cueing Cueing Techniques: Verbal cues, Gestural cues     General Comments General comments (skin integrity, edema, etc.): HR in 130s with ambulation        Assessment/Plan    PT Assessment Patient needs continued PT services  PT Problem List Decreased strength;Decreased range of motion;Decreased activity tolerance;Decreased balance;Decreased mobility       PT Treatment Interventions DME instruction;Gait training;Functional mobility training;Therapeutic activities;Therapeutic exercise;Balance training;Neuromuscular re-education;Cognitive remediation;Patient/family education    PT Goals (Current goals can be found in the Care Plan section)  Acute Rehab PT Goals PT Goal Formulation: With patient Time For Goal Achievement: 03/11/24 Potential to Achieve Goals: Fair    Frequency Min 2X/week        AM-PAC PT 6 Clicks Mobility  Outcome Measure Help needed turning from your back to your  side while in a flat bed without using bedrails?: None Help needed moving from lying on your back to sitting on the side of a flat bed without using bedrails?: A Little Help needed moving to and from a bed to a chair (including a wheelchair)?: A Little Help needed standing up from a chair using your arms (e.g., wheelchair or bedside chair)?: A Little Help needed to walk in hospital room?: A Little Help needed climbing 3-5 steps with a railing? : A Lot 6 Click Score: 18    End of Session Equipment Utilized During Treatment: Gait belt Activity Tolerance: Patient tolerated treatment well Patient left: in chair;with chair alarm set;with call bell/phone within reach Nurse Communication: Mobility status PT Visit Diagnosis: Unsteadiness on feet (R26.81);Other abnormalities of gait and mobility (R26.89);Muscle weakness (generalized) (M62.81)    Time: 9086-9059 PT Time Calculation (min) (ACUTE ONLY): 27 min   Charges:   PT Evaluation $PT Eval Moderate Complexity: 1 Mod PT Treatments $Therapeutic Activity: 8-22 mins PT General Charges $$ ACUTE PT VISIT: 1 Visit         Calyse Murcia B. Fleeta Lapidus PT, DPT Acute Rehabilitation Services Please use secure chat or  Call Office [phone]   Almarie KATHEE Fleeta [facility] 02/26/2024, 10:04 AM

## 2024-02-26 NOTE — Consult Note (Signed)
 Referring Provider: Teaching service Primary Care Physician:  Rosan Mix, MD Primary Gastroenterologist:  Dr. San  Reason for Consultation:  Cirrhosis, ascites  HPI: Bob Warren is a 78 y.o. 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  He presented to [facility] on 7/31 for lower extremity edema and ascites.  Ultrasound showed large volume ascites similar to prior study and he underwent 9 Liter paracentesis on 02/25/2024.  This is negative for SBP.  Creatinine 1.79, but appears to be roughly about his baseline.  Sodium was 129, 133 today.  He reports being adherent to Lasix  40 mg twice daily and spironolactone  25 mg daily at home.  He did receive 1 dose of Lasix  80 mg IV today, but otherwise his home diuretics are on hold.  He also got a 60 mg dose IV in the ED yesterday as well.  No abdominal pain, just pressure from the fluid and feels much better after paracentesis.  No black or bloody stools.  Extensive GI procedures over the years, frequent endoscopies for evaluation of bleeding issues.   Last EGD 01/2023:  - Grade II esophageal varices. - Esophageal mucosal changes consistent with short- segment Barrett' s esophagus. - Nodular GAVE treated with 6 bands. - Portal hypertensive gastropathy. - Normal examined duodenum. - No specimens collected.  Past Medical History:  Diagnosis Date   Arthritis    Diabetes (HCC)    High cholesterol    Hypertension    Kidney stone    Myasthenia gravis [facility])     Past Surgical History:  Procedure Laterality Date   COLONOSCOPY  01/09/2021   Dr.Raghid Donnise [facility]. Poor prep.   ESOPHAGOGASTRODUODENOSCOPY N/A 01/14/2018   Procedure: ESOPHAGOGASTRODUODENOSCOPY (EGD);  Surgeon:  Janalyn Keene NOVAK, MD;  Location: [facility] ENDOSCOPY;  Service: Endoscopy;  Laterality: N/A;   ESOPHAGOGASTRODUODENOSCOPY  01/09/2021   Dr.Raghid Donnise, [facility]. Hiatal Hernia. Findings concerning for nodular Barrett's disease. Biopsied. 2cm submucosal gastric body mass. Reosive gastritis, Biopsied.   ESOPHAGOGASTRODUODENOSCOPY N/A 01/16/2023   Procedure: ESOPHAGOGASTRODUODENOSCOPY (EGD);  Surgeon: Federico Rosario BROCKS, MD;  Location: [facility];  Service: Gastroenterology;  Laterality: N/A;   ESOPHAGOGASTRODUODENOSCOPY (EGD) WITH PROPOFOL  N/A 06/10/2022   Procedure: ESOPHAGOGASTRODUODENOSCOPY (EGD) WITH PROPOFOL ;  Surgeon: San Sandor GAILS, DO;  Location: [facility] ENDOSCOPY;  Service: Gastroenterology;  Laterality: N/A;   ESOPHAGOGASTRODUODENOSCOPY (EGD) WITH PROPOFOL  N/A 02/20/2023   Procedure: ESOPHAGOGASTRODUODENOSCOPY (EGD) WITH PROPOFOL ;  Surgeon: San Sandor GAILS, DO;  Location: [facility] ENDOSCOPY;  Service: Gastroenterology;  Laterality: N/A;   GASTRIC VARICES BANDING  01/16/2023   Procedure: GASTRIC GAVE BANDING;  Surgeon: Federico Rosario BROCKS, MD;  Location: [facility];  Service: Gastroenterology;;   GASTRIC VARICES BANDING  02/20/2023   Procedure: GASTRIC VARICES BANDING;  Surgeon: San Sandor GAILS, DO;  Location: [facility] ENDOSCOPY;  Service: Gastroenterology;;   HOT HEMOSTASIS N/A 06/10/2022   Procedure: HOT HEMOSTASIS (ARGON PLASMA COAGULATION/BICAP);  Surgeon: San Sandor GAILS, DO;  Location: [facility] ENDOSCOPY;  Service: Gastroenterology;  Laterality: N/A;   IR PARACENTESIS  02/25/2024   SKIN CANCER EXCISION Right 2016   Arm    Prior to Admission medications   Medication Sig Start Date End Date Taking? Authorizing Provider  atorvastatin  (LIPITOR) 20 MG tablet Take 20 mg by mouth daily. 01/03/20  Yes [provider]  Cholecalciferol  (VITAMIN D3) 125 MCG (5000 UT) TABS  Take 5,000 Units by mouth 2 (two) times daily.   Yes [provider]  diltiazem  (TIAZAC ) 120 MG 24 hr capsule Take 120 mg  by mouth daily.   Yes [provider]  ferrous sulfate  325 (65 FE) MG EC tablet Take 325 mg by mouth daily.   Yes [provider]  furosemide  (LASIX ) 40 MG tablet Take 1 tablet (40 mg total) by mouth 2 (two) times daily. 01/02/24  Yes Fairy Frames, MD  insulin  aspart (NOVOLOG ) 100 UNIT/ML injection Inject 4-6 Units into the skin See admin instructions. Inject 4-6 units into the skin three times daily, after meals. 01/28/23 04/03/24 Yes [provider]  insulin  glargine (LANTUS ) 100 UNIT/ML injection Inject 0.14 mLs (14 Units total) into the skin at bedtime. Patient taking differently: Inject 30-40 Units into the skin at bedtime. 02/03/18  Yes Danton Reyes DASEN, MD  losartan  (COZAAR ) 50 MG tablet Take 25 mg by mouth daily.   Yes [provider]  metFORMIN (GLUCOPHAGE) 850 MG tablet Take 850 mg by mouth 2 (two) times daily.   Yes [provider]  Omega-3 Fatty Acids (FISH OIL PO) Take 1 capsule by mouth in the morning.   Yes [provider]  omeprazole (PRILOSEC) 40 MG capsule Take 40 mg by mouth at bedtime.   Yes [provider]  potassium chloride  SA (KLOR-CON  M) 20 MEQ tablet 20 mEq See admin instructions. Disperse 1 tablet ( ) into 4 ounces of water and drink, once daily.   Yes [provider]  Probiotic Product (PROBIOTIC PO) Take 1 capsule by mouth daily.   Yes [provider]  traMADol  (ULTRAM ) 50 MG tablet Take 1 tablet (50 mg total) by mouth every 6 (six) hours as needed for moderate pain. Patient taking differently: Take 50 mg by mouth 2 (two) times daily as needed (pain). 02/03/18  Yes Danton Reyes DASEN, MD  gabapentin (NEURONTIN) 100 MG capsule Take 100 mg by mouth 2 (two) times daily. Patient not taking: Reported on 02/25/2024    [provider]  spironolactone  (ALDACTONE ) 25 MG tablet Take 1 tablet (25 mg total) by mouth daily. Patient not taking: Reported on 02/25/2024 01/02/24   Fairy Frames, MD     Current Facility-Administered Medications  Medication Dose Route Frequency Provider Last Rate Last Admin   acetaminophen  (TYLENOL ) tablet 500 mg  500 mg Oral Q6H PRN Zheng, Michael, DO       Or   acetaminophen  (TYLENOL ) suppository 650 mg  650 mg Rectal Q6H PRN Zheng, Michael, DO       diclofenac  Sodium (VOLTAREN ) 1 % topical gel 2 g  2 g Topical QID Zheng, Michael, DO   2 g at 02/26/24 1302   diltiazem  (CARDIZEM  CD) 24 hr capsule 120 mg  120 mg Oral Daily Zheng, Michael, DO   120 mg at 02/26/24 1327   enoxaparin  (LOVENOX ) injection 40 mg  40 mg Subcutaneous Q24H Zheng, Michael, DO   40 mg at 02/26/24 1300   insulin  aspart (novoLOG ) injection 0-6 Units  0-6 Units Subcutaneous TID WC Zheng, Michael, DO   2 Units at 02/26/24 1301   pantoprazole  (PROTONIX ) EC tablet 80 mg  80 mg Oral Daily Zheng, Michael, DO   80 mg at 02/26/24 0821   polyethylene glycol (MIRALAX  / GLYCOLAX ) packet 17 g  17 g Oral Daily PRN Amilibia, Jaden, DO   17 g at 02/26/24 1301   senna-docusate (Senokot-S) tablet 1 tablet  1 tablet Oral Daily Zheng, Michael, DO  1 tablet at 02/26/24 1301   traMADol  (ULTRAM ) tablet 50 mg  50 mg Oral Q12H PRN Zheng, Michael, DO        Allergies as of 02/24/2024 - Review Complete 02/24/2024  Allergen Reaction Noted   Imuran  [azathioprine ] Other (See Comments) 01/19/2018   Lisinopril Cough 03/15/2013    Family History  Problem Relation Age of Onset   Breast cancer Mother    Other Father        Gunshot wound   Diabetes Maternal Grandfather    Heart disease Maternal Grandfather    Colon cancer Neg Hx    Esophageal cancer Neg Hx    Stomach cancer Neg Hx     Social History   Socioeconomic History   Marital status: Married    Spouse name: Not on file   Number of children: 2   Years of education: 12   Highest education level: Not on file  Occupational History   Occupation: Retired  Tobacco Use   Smoking status: Former   Smokeless tobacco: Never   Tobacco comments:     Quit 25 yrs ago  Advertising account planner   Vaping status: Never Used  Substance and Sexual Activity   Alcohol use: No    Comment: Quit 25 yrs ago   Drug use: No   Sexual activity: Not on file  Other Topics Concern   Not on file  Social History Narrative   Lives at home w/ his wife   Right-handed   Caffeine: 3-6 cups of coffee per day   Social Drivers of Health   Financial Resource Strain: Medium Risk (12/24/2023)   Received from [facility]   Overall Financial Resource Strain (CARDIA)    Difficulty of Paying Living Expenses: Somewhat hard  Food Insecurity: No Food Insecurity (02/25/2024)   Hunger Vital Sign    Worried About Running Out of Food in the Last Year: Never true    Ran Out of Food in the Last Year: Never true  Transportation Needs: No Transportation Needs (02/25/2024)   PRAPARE - Administrator, Civil Service (Medical): No    Lack of Transportation (Non-Medical): No  Recent Concern: Transportation Needs - Unmet Transportation Needs (12/24/2023)   Received from [facility]   [facility] - Transportation    Lack of Transportation (Medical): Yes    Lack of Transportation (Non-Medical): Yes  Physical Activity: Sufficiently Active (11/27/2022)   Received from [facility]   Exercise Vital Sign    On average, how many days per week do you engage in moderate to strenuous exercise (like a brisk walk)?: 7 days    On average, how many minutes do you engage in exercise at this level?: 30 min  Stress: No Stress Concern Present (11/27/2022)   [facility] - Occupational Stress Questionnaire    Feeling of Stress : Only a little  Social Connections: Moderately Integrated (02/25/2024)   Social Connection and Isolation Panel    Frequency of Communication with Friends and Family: More than three times a week    Frequency of Social Gatherings with Friends and Family: Once a week    Attends Religious Services: 1 to 4 times  per year    Active Member of [employer] or Organizations: No    Attends Banker Meetings: Never    Marital Status: Married  Catering manager Violence: Not At Risk (02/25/2024)   Humiliation, Afraid, Rape, and Kick questionnaire  Fear of Current or Ex-Partner: No    Emotionally Abused: No    Physically Abused: No    Sexually Abused: No    Review of Systems: ROS is O/W negative except as mentioned in HPI.  Physical Exam: Vital signs in last 24 hours: Temp:  [98.2 F (36.8 C)-98.9 F (37.2 C)] 98.9 F (37.2 C) (07/31 1607) Pulse Rate:  [92-117] 102 (07/31 1607) Resp:  [17-19] 19 (07/31 1607) BP: (113-146)/(59-84) 115/65 (07/31 1607) SpO2:  [95 %-100 %] 95 % (07/31 1607) Weight:  [86.1 kg] 86.1 kg (07/31 0408) Last BM Date : 02/24/24 General:  Alert, elderly, in NAD Head:  Normocephalic and atraumatic. Eyes:  Sclera clear, no icterus.  Conjunctiva pink. Ears:  Normal auditory acuity. Mouth:  No deformity or lesions.   Lungs:  Clear throughout to auscultation.  No wheezes, crackles, or rhonchi.  Heart:  Tachy. Abdomen:  Softly distended.  BS present.  Non-tender. Msk:  Symmetrical without gross deformities.  Pulses:  Normal pulses noted. Extremities:  Some edema noted in B/L LEs with chronic skin changes with some dry skin and erythema. Neurologic:  Alert and oriented x 4;  grossly normal neurologically. Skin:  Intact without significant lesions or rashes. Psych:  Alert and cooperative. Normal mood and affect.  Intake/Output from previous day: 07/30 0701 - 07/31 0700 In: 473 [P.O.:473] Out: 500 [Urine:500] Intake/Output this shift: Total I/O In: 236 [P.O.:236] Out: -   Lab Results: Recent Labs    02/24/24 1512 02/26/24 0156 02/26/24 1255  WBC 11.7* 5.3 6.5  HGB 9.9* 7.6* 9.5*  HCT 31.7* 24.7* 30.0*  PLT 233 103* 121*   BMET Recent Labs    02/24/24 1512 02/26/24 0156  NA 129* 133*  K 5.1 4.6  CL 98 100  CO2 20* 23  GLUCOSE 154* 195*  BUN 18  28*  CREATININE 1.77* 1.79*  CALCIUM  8.8* 8.7*   LFT Recent Labs    02/26/24 0156  PROT 5.6*  ALBUMIN  2.7*  AST 16  ALT 13  ALKPHOS 52  BILITOT 0.8   PT/INR Recent Labs    02/25/24 1204  LABPROT 16.1*  INR 1.2   Studies/Results: IR Paracentesis Result Date: 02/25/2024 INDICATION: 78 year old male. History of CHF with recurrent ascites. Request for therapeutic paracentesis EXAM: ULTRASOUND GUIDED THERAPEUTIC LEFT-SIDED PARACENTESIS MEDICATIONS: Lidocaine  1% 10 mL COMPLICATIONS: None immediate. PROCEDURE: Informed written consent was obtained from the patient after a discussion of the risks, benefits and alternatives to treatment. A timeout was performed prior to the initiation of the procedure. Initial ultrasound scanning demonstrates a large amount of ascites within the right lower abdominal quadrant. The right lower abdomen was prepped and draped in the usual sterile fashion. 1% lidocaine  was used for local anesthesia. Following this, a 19 gauge, 7-cm, Yueh catheter was introduced. An ultrasound image was saved for documentation purposes. The paracentesis was performed. The catheter was removed and a dressing was applied. The patient tolerated the procedure well without immediate post procedural complication. Patient received post-procedure intravenous albumin ; see nursing notes for details. FINDINGS: A total of approximately 9.4 L of straw-colored fluid was removed. IMPRESSION: Successful ultrasound-guided paracentesis yielding 9.4 liters of peritoneal fluid. Performed by Delon Beagle NP Electronically Signed   By: Ester Sides M.D.   On: 02/25/2024 16:07   US  ASCITES (ABDOMEN LIMITED) Result Date: 02/25/2024 CLINICAL DATA:  78 year old male with ascites, heart failure. Status post ultrasound-guided paracentesis on 12/28/2023. EXAM: LIMITED ABDOMEN ULTRASOUND FOR ASCITES TECHNIQUE: Limited ultrasound survey for ascites was  performed in all four abdominal quadrants. COMPARISON:   12/28/2023 ultrasound. FINDINGS: Grayscale images of the 4 quadrants demonstrate large volume recurrent ascites with simple appearing fluid density (image 11). Volume appears similar to that prior to the June paracentesis. IMPRESSION: Recurrent ascites since June paracentesis, Large volume of similar to prior. Electronically Signed   By: VEAR Hurst M.D.   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  Reports being on Lasix  40 mg twice daily and spironolactone  25 mg daily at home.  *Chronic IDA history of esophageal varices, GAVE, portal hypertensive gastropathy: Hemoglobin currently 9.5 g.  No sign of GI bleeding. *CKD stage 3:  He is unaware if he sees a nephrologist as an outpatient.  PLAN: - TIPS is something that could be considered and discussed with IR.  He does have an elevated creatinine, which may limit the ability to increase/titrate his diuretics.  TIPS could also help with esophageal varices and portal hypertensive gastropathy and may reduce some recurrent upper GI bleeding risk. - 2 g sodium diet.   Harlene BIRCH. Triston Lisanti  02/26/2024, 4:23 PM

## 2024-02-26 NOTE — Evaluation (Signed)
 Occupational Therapy Evaluation Patient Details Name: Bob Warren MRN: [id number] DOB: [birth year] Today's Date: 02/26/2024   History of Present Illness   Pt is a 78 y/o male presenting with SOB, abdominal distension and BLE edema. Admitted for volume overload, decompensated cirrhosis and hyponatremia. Paracentesis 7/30 removing 9.4L. PMH: chronic HFpEF, myasthenia gravis, PAF, GAVE, chronic bilateral lower extremity edema, hypertension, hyperlipidemia, GERD, type 2 diabetes, cirrhosis d/t MASH     Clinical Impressions PTA, pt lives with wife, typically Modified Independent with mobility using Rollator though recently difficult d/t BLE edema. Pt able to bathe and toilet self but requires wife assist for LB dressing and IADLs. Pt presents now with deficits in strength, endurance and dynamic standing balance. Initially, pt unsure if he would be able to mobilize but was able to walk in room using RW with CGA. Pt requires Setup for UB ADL and Min-Mod A for LB ADLs. Discussed ADL modification for energy conservation and areas where assist may be needed. Recommend HHOT follow up at DC.      If plan is discharge home, recommend the following:   A little help with bathing/dressing/bathroom;Assistance with cooking/housework;Assist for transportation     Functional Status Assessment   Patient has had a recent decline in their functional status and demonstrates the ability to make significant improvements in function in a reasonable and predictable amount of time.     Equipment Recommendations   None recommended by OT     Recommendations for Other Services         Precautions/Restrictions   Precautions Precautions: Fall Restrictions Weight Bearing Restrictions Per Provider Order: No     Mobility Bed Mobility Overal bed mobility: Needs Assistance Bed Mobility: Supine to Sit, Sit to Supine     Supine to sit: Min assist, HOB elevated, Used rails Sit to supine: Mod assist    General bed mobility comments: Min A to lift trunk with handheld assist, use of bed rails and increased time. Mod A for BLE back to bed. (No recliner in room at time of eval- OT located after session)    Transfers Overall transfer level: Needs assistance Equipment used: Rolling walker (2 wheels) Transfers: Sit to/from Stand Sit to Stand: Min assist           General transfer comment: Very light  Min A to fully come to stand with RW      Balance Overall balance assessment: Needs assistance Sitting-balance support: No upper extremity supported, Feet supported Sitting balance-Leahy Scale: Good     Standing balance support: Bilateral upper extremity supported, During functional activity, Single extremity supported Standing balance-Leahy Scale: Poor Standing balance comment: reliant on BUE for mobility and one UE for static standing at sink during ADLs                           ADL either performed or assessed with clinical judgement   ADL Overall ADL's : Needs assistance/impaired Eating/Feeding: Independent   Grooming: Contact guard assist;Standing;Wash/dry face;Brushing hair Grooming Details (indicate cue type and reason): reliant on one UE support on sink; otherwise imbalance noted Upper Body Bathing: Set up;Sitting   Lower Body Bathing: Minimal assistance;Sitting/lateral leans;Sit to/from stand   Upper Body Dressing : Set up;Sitting   Lower Body Dressing: Moderate assistance;Sitting/lateral leans;Sit to/from stand   Toilet Transfer: Contact guard assist;Ambulation;Rolling walker (2 wheels)   Toileting- Clothing Manipulation and Hygiene: Minimal assistance;Sitting/lateral lean;Sit to/from stand       Functional mobility during  ADLs: Contact guard assist;Rolling walker (2 wheels)       Vision Baseline Vision/History: 1 Wears glasses Ability to See in Adequate Light: 0 Adequate Patient Visual Report: No change from baseline Vision Assessment?: No  apparent visual deficits     Perception         Praxis         Pertinent Vitals/Pain Pain Assessment Pain Assessment: No/denies pain     Extremity/Trunk Assessment Upper Extremity Assessment Upper Extremity Assessment: Generalized weakness;Right hand dominant   Lower Extremity Assessment Lower Extremity Assessment: Defer to PT evaluation   Cervical / Trunk Assessment Cervical / Trunk Assessment: Normal   Communication Communication Communication: No apparent difficulties   Cognition Arousal: Alert Behavior During Therapy: WFL for tasks assessed/performed Cognition: No apparent impairments                               Following commands: Intact       Cueing  General Comments   Cueing Techniques: Verbal cues;Gestural cues  Discussed compression stockings for BLE d/t edema with pt reporting he has some but does not wear often. Has some shorter compression socks that family purchased for him that he uses instead.   Exercises     Shoulder Instructions      Home Living Family/patient expects to be discharged to:: Private residence Living Arrangements: Spouse/significant other Available Help at Discharge: Family;Available 24 hours/day Type of Home: House Home Access: Ramped entrance     Home Layout: One level     Bathroom Shower/Tub: Chief Strategy Officer: Standard     Home Equipment: Grab bars - tub/shower;Rolling Environmental consultant (2 wheels);Wheelchair - manual;Tub bench;Grab bars - toilet;Shower seat;Rollator (4 wheels)          Prior Functioning/Environment Prior Level of Function : Needs assist             Mobility Comments: reports able to transfer out of bed and mobilize with Rollator. reports some recent difficulty due to LE swelling. Has been working with HHPT ADLs Comments: Reports able to bathe and toilet self. Reports wife assists with LB dressing and manages IADLs. Daughter in law assists for transportation for pt and  spouse    OT Problem List: Decreased strength;Decreased activity tolerance;Impaired balance (sitting and/or standing);Increased edema   OT Treatment/Interventions: Therapeutic exercise;Self-care/ADL training;Energy conservation;DME and/or AE instruction;Therapeutic activities;Balance training;Patient/family education      OT Goals(Current goals can be found in the care plan section)   Acute Rehab OT Goals Patient Stated Goal: decrease leg swelling OT Goal Formulation: With patient Time For Goal Achievement: 03/11/24 Potential to Achieve Goals: Good ADL Goals Pt Will Perform Grooming: with modified independence;standing Pt Will Perform Lower Body Bathing: with supervision;sit to/from stand;sitting/lateral leans;with adaptive equipment Pt Will Transfer to Toilet: with set-up;ambulating Pt/caregiver will Perform Home Exercise Program: Increased strength;Both right and left upper extremity;With theraband;Independently;With written HEP provided Additional ADL Goal #1: Pt to verbalize at least 3 energy conservation strategies to implement during daily routine   OT Frequency:  Min 2X/week    Co-evaluation              AM-PAC OT 6 Clicks Daily Activity     Outcome Measure Help from another person eating meals?: None Help from another person taking care of personal grooming?: A Little Help from another person toileting, which includes using toliet, bedpan, or urinal?: A Little Help from another person bathing (including washing, rinsing,  drying)?: A Little Help from another person to put on and taking off regular upper body clothing?: A Little Help from another person to put on and taking off regular lower body clothing?: A Lot 6 Click Score: 18   End of Session Equipment Utilized During Treatment: Rolling walker (2 wheels);Gait belt Nurse Communication: Mobility status  Activity Tolerance: Patient tolerated treatment well Patient left: in bed;with call bell/phone within  reach;with bed alarm set  OT Visit Diagnosis: Unsteadiness on feet (R26.81);Other abnormalities of gait and mobility (R26.89);Muscle weakness (generalized) (M62.81)                Time: 9272-9244 OT Time Calculation (min): 28 min Charges:  OT General Charges $OT Visit: 1 Visit OT Evaluation $OT Eval Low Complexity: 1 Low OT Treatments $Self Care/Home Management : 8-22 mins  Mliss NOVAK, OTR/L Acute Rehab Services Office: [phone]   Mliss Fish 02/26/2024, 8:36 AM

## 2024-02-26 NOTE — Progress Notes (Addendum)
 HD#0 SUBJECTIVE:  Patient Summary: Bob Warren is a 78 y.o. with a pertinent PMH of GAVE, myasthenia gravis, HFpEF (60-65%), HTN, HLD, T2DM, GERD, , who presented with abdominal distention, bilateral leg swelling, and shortness of breath and admitted for hypervolemia in the setting of ascites.   Overnight Events: none  Interim History: Patient evaluated at bedside. Today reports feeling alright. He denies the pain in his abdomen he was having previously and any nausea today. He notes a right ankle pain, but has noticed that this comes and goes, and does not feel it is related to what is currently going on. He denies blood in his stools, but does note a dark stool 2 days ago, and has had no BM since then.  Been feeling constipated since. He is agreeable to receiving blood products if he were to need a blood transfusion. He would like a phone call to his step-daughter Bob Warren for an update. He does not think that he has an outpatient GI specialist.   OBJECTIVE:  Vital Signs: Vitals:   02/26/24 0406 02/26/24 0408 02/26/24 0743 02/26/24 1156  BP: 113/69  139/84 (!) 146/75  Pulse: (!) 102  (!) 113 (!) 117  Resp: 17  19 19   Temp: 98.5 F (36.9 C)   98.7 F (37.1 C)  TempSrc: Oral   Oral  SpO2: 99%  100% 100%  Weight:  86.1 kg    Height:       Supplemental O2: Room Air SpO2: 100 %  Filed Weights   02/24/24 1437 02/26/24 0408  Weight: 98 kg 86.1 kg     Intake/Output Summary (Last 24 hours) at 02/26/2024 1443 Last data filed at 02/26/2024 0842 Gross per 24 hour  Intake 473 ml  Output 500 ml  Net -27 ml   Net IO Since Admission: 209 mL [02/26/24 1443]  Physical Exam: Physical Exam  Patient Lines/Drains/Airways Status     Active Line/Drains/Airways     Name Placement date Placement time Site Days   Peripheral IV 02/25/24 20 G Left Antecubital 02/25/24  1700  Antecubital  1            Pertinent labs and imaging:      Latest Ref Rng & Units 02/26/2024   12:55  PM 02/26/2024    1:56 AM 02/24/2024    3:12 PM  CBC  WBC 4.0 - 10.5 K/uL 6.5  5.3  11.7   Hemoglobin 13.0 - 17.0 g/dL 9.5  7.6  9.9   Hematocrit 39.0 - 52.0 % 30.0  24.7  31.7   Platelets 150 - 400 K/uL 121  103  233        Latest Ref Rng & Units 02/26/2024    1:56 AM 02/24/2024    3:12 PM 01/02/2024    3:06 AM  CMP  Glucose 70 - 99 mg/dL 804  845  837   BUN 8 - 23 mg/dL 28  18  33   Creatinine 0.61 - 1.24 mg/dL 8.20  8.22  8.37   Sodium 135 - 145 mmol/L 133  129  139   Potassium 3.5 - 5.1 mmol/L 4.6  5.1  3.4   Chloride 98 - 111 mmol/L 100  98  105   CO2 22 - 32 mmol/L 23  20  26    Calcium  8.9 - 10.3 mg/dL 8.7  8.8  8.0   Total Protein 6.5 - 8.1 g/dL 5.6  7.2    Total Bilirubin 0.0 - 1.2 mg/dL 0.8  1.1    Alkaline Phos 38 - 126 U/L 52  87    AST 15 - 41 U/L 16  30    ALT 0 - 44 U/L 13  20      IR Paracentesis Result Date: 02/25/2024 INDICATION: 78 year old male. History of CHF with recurrent ascites. Request for therapeutic paracentesis EXAM: ULTRASOUND GUIDED THERAPEUTIC LEFT-SIDED PARACENTESIS MEDICATIONS: Lidocaine  1% 10 mL COMPLICATIONS: None immediate. PROCEDURE: Informed written consent was obtained from the patient after a discussion of the risks, benefits and alternatives to treatment. A timeout was performed prior to the initiation of the procedure. Initial ultrasound scanning demonstrates a large amount of ascites within the right lower abdominal quadrant. The right lower abdomen was prepped and draped in the usual sterile fashion. 1% lidocaine  was used for local anesthesia. Following this, a 19 gauge, 7-cm, Yueh catheter was introduced. An ultrasound image was saved for documentation purposes. The paracentesis was performed. The catheter was removed and a dressing was applied. The patient tolerated the procedure well without immediate post procedural complication. Patient received post-procedure intravenous albumin ; see nursing notes for details. FINDINGS: A total of  approximately 9.4 L of straw-colored fluid was removed. IMPRESSION: Successful ultrasound-guided paracentesis yielding 9.4 liters of peritoneal fluid. Performed by Delon Beagle NP Electronically Signed   By: Ester Sides M.D.   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    Plan: #Volume overload #Decompensated cirrhosis suspect from [facility] #History of esophageal varices #HFpEF (60-65%) Presented with recurrent ascites and bilateral LE edema. Was admitted in May/June for ascites and HFpEF. Received paracentesis during last admission with no signs of infeciton or malignant cells. No signs of bleeding at this time. Renal function about the same compared to what it was at time of discharge from June. US  abdomen showed large volume ascites. Exam was nontender, afebrile, so less suspicious for SBP. A&O x3 without current signs of encephalopathy. Hepatitis B surface antigen and antibody normal 2023. Hepatitis C panel normal in 2019. AFP tumor marker normal. Weight at last discharge was 195lb on 6/6. Paracentesis yesterday was done, 9.4 L of straw colored fluid. Albumin  75g was given post-draw. Patient tolerated well. SAAG 0.7-0.8  prior to albumin , with albumin , SAAG over 1.1, suggesting nonportal hypertension cause.  Given the state of his decompensated cirrhosis, will consult GI for possible TIPS procedure.  Patient's heart rate elevated to mid 110s. Will restart home diltiazem  120mg .  Will give patient Senokot and MiraLAX  for constipation. -Will continue to monitor volume status today to dose diuresis -IV Lasix  80 mg today -Strict I&Os, daily weights -Trend BMP and CBC -Will get UA to assess for proteinuria -Restart diltiazem  120 mg -Senokot and MiraLAX  -If any signs of SBP, will start CTX  #Hyponatremia  Na 129, potentially secondary to hypervolemia. Na 133 today.  Alert and oriented. Volume overloaded, getting IV diuresis. Will monitor sodium level. -Trend BMP  #CKD Stage 3 Serum Cr 1.77 with GFR 39 on admission. Scr today 1.79. Last Scr at discharge 12/2023 was 1.62 with GFR 43. Appears baseline. -Trend renal function  #T2DM, controlled Last A1c 6.5% in June. Noted at last hospitalization metformin was discontinued from home med list. -CBG monitoring -Very Sensitive SSI with meals  #Gastric Antral Vascular Ectasia (GAVE) #Iron Deficiency Anemia Hemoglobin on admission 9.9 which is improved from discharge in June. Patient denies any signs of GI bleed at this time. Has been taking his iron supplement. Hgb today 7.6, HCT 24.7. Denies hematochezia, melena. WBC normal now 5.3 from 11.7, but has thrombocytopenia. -Monitor CBC and signs of bleeding -midday CBC to assess for further drops in Hgb. -If Hgb <7, will transfuse -If concerns of GI bleed will need to start ceftriaxone.  #Myasthenia Gravis Chronic. No acute flares. Not currently on therapy. Will monitor for symptom development.   #HTN #HLD BP normotensive on arrival, mildly hypertensive. Receiving Lasix  at this time. PTA on spironolactone  25mg , Lasix  40mg  BID along with diltiazem  120mg .  -Continue diuresis with IV Lasix  -hold spironolactone  given  normotensive readings -Hold home atorvastatin   #Chronic back pain #Chronic right hip pain Chronic. CT hip in 2024 showed severe right hip degenerative changes. PTA on tramadol  50mg  usually taken once to twice a day as needed. -Voltaren  gel, heating pad -Home Tramadol  50mg  BID PRN   Best Practice: Diet: Cardiac diet IVF: Fluids: none VTE: enoxaparin  (LOVENOX ) injection 40 mg Start: 02/25/24 1100 Code: Full  Disposition planning: Therapy Recs: Pending,  Family Contact: wife DISPO: Anticipated discharge in 1-2 days to Home pending clinical improvement.  Signature:  Kenzee Bassin [facility]  2:43 PM, 02/26/2024  On Call pager [phone]

## 2024-02-26 NOTE — Progress Notes (Signed)
 Transition of Care [facility]) - Inpatient Brief Assessment   Patient Details  Name: Bob Warren MRN: [id number] Date of Birth: 06/20/1946  Transition of Care [facility]) CM/SW Contact:    Rosaline JONELLE Joe, RN Phone Number: 02/26/2024, 12:39 PM   Clinical Narrative: CM met with the patient at the bedside.  Medicare choice regarding home health was offered to the patient and patient states that he is active with [facility].  HH orders for PT/OT placed to be co-signed by MD.  I called Burnard, CM with [facility] and she will continue to follow the patient for restart of services.  No DME needed.   Transition of Care Asessment: Insurance and Status: (P) Insurance coverage has been reviewed Patient has primary care physician: (P) Yes Home environment has been reviewed: (P) from home with spouse Prior level of function:: (P) Rollator Prior/Current Home Services: (P) Current home services (Active with [facility]) Social Drivers of Health Review: (P) SDOH reviewed interventions complete Readmission risk has been reviewed: (P) Yes Transition of care needs: (P) transition of care needs identified, TOC will continue to follow

## 2024-02-27 ENCOUNTER — Other Ambulatory Visit: Payer: Self-pay

## 2024-02-27 ENCOUNTER — Telehealth: Payer: Self-pay

## 2024-02-27 DIAGNOSIS — N183 Chronic kidney disease, stage 3 unspecified: Secondary | ICD-10-CM | POA: Diagnosis not present

## 2024-02-27 DIAGNOSIS — E8779 Other fluid overload: Secondary | ICD-10-CM | POA: Diagnosis not present

## 2024-02-27 DIAGNOSIS — I5032 Chronic diastolic (congestive) heart failure: Secondary | ICD-10-CM | POA: Diagnosis not present

## 2024-02-27 DIAGNOSIS — K7460 Unspecified cirrhosis of liver: Secondary | ICD-10-CM | POA: Diagnosis not present

## 2024-02-27 DIAGNOSIS — R188 Other ascites: Secondary | ICD-10-CM

## 2024-02-27 LAB — CBC
HCT: 24.9 % — ABNORMAL LOW (ref 39.0–52.0)
HCT: 28.3 % — ABNORMAL LOW (ref 39.0–52.0)
Hemoglobin: 7.9 g/dL — ABNORMAL LOW (ref 13.0–17.0)
Hemoglobin: 8.8 g/dL — ABNORMAL LOW (ref 13.0–17.0)
MCH: 25.3 pg — ABNORMAL LOW (ref 26.0–34.0)
MCH: 25.3 pg — ABNORMAL LOW (ref 26.0–34.0)
MCHC: 31.7 g/dL (ref 30.0–36.0)
MCHC: 31.1 g/dL (ref 30.0–36.0)
MCV: 79.8 fL — ABNORMAL LOW (ref 80.0–100.0)
MCV: 81.3 fL (ref 80.0–100.0)
Platelets: 108 K/uL — ABNORMAL LOW (ref 150–400)
Platelets: 122 K/uL — ABNORMAL LOW (ref 150–400)
RBC: 3.12 MIL/uL — ABNORMAL LOW (ref 4.22–5.81)
RBC: 3.48 MIL/uL — ABNORMAL LOW (ref 4.22–5.81)
RDW: 16.3 % — ABNORMAL HIGH (ref 11.5–15.5)
RDW: 16.2 % — ABNORMAL HIGH (ref 11.5–15.5)
WBC: 5.2 K/uL (ref 4.0–10.5)
WBC: 6.5 K/uL (ref 4.0–10.5)
nRBC: 0 % (ref 0.0–0.2)
nRBC: 0 % (ref 0.0–0.2)

## 2024-02-27 LAB — COMPREHENSIVE METABOLIC PANEL WITH GFR
ALT: 12 U/L (ref 0–44)
AST: 14 U/L — ABNORMAL LOW (ref 15–41)
Albumin: 2.2 g/dL — ABNORMAL LOW (ref 3.5–5.0)
Alkaline Phosphatase: 50 U/L (ref 38–126)
Anion gap: 9 (ref 5–15)
BUN: 30 mg/dL — ABNORMAL HIGH (ref 8–23)
CO2: 24 mmol/L (ref 22–32)
Calcium: 8.6 mg/dL — ABNORMAL LOW (ref 8.9–10.3)
Chloride: 101 mmol/L (ref 98–111)
Creatinine, Ser: 1.71 mg/dL — ABNORMAL HIGH (ref 0.61–1.24)
GFR, Estimated: 41 mL/min — ABNORMAL LOW (ref >60)
Glucose, Bld: 277 mg/dL — ABNORMAL HIGH (ref 70–99)
Potassium: 3.6 mmol/L (ref 3.5–5.1)
Sodium: 134 mmol/L — ABNORMAL LOW (ref 135–145)
Total Bilirubin: 0.7 mg/dL (ref 0.0–1.2)
Total Protein: 5.1 g/dL — ABNORMAL LOW (ref 6.5–8.1)

## 2024-02-27 LAB — GLUCOSE, CAPILLARY
Glucose-Capillary: 188 mg/dL — ABNORMAL HIGH (ref 70–99)
Glucose-Capillary: 231 mg/dL — ABNORMAL HIGH (ref 70–99)
Glucose-Capillary: 323 mg/dL — ABNORMAL HIGH (ref 70–99)
Glucose-Capillary: 359 mg/dL — ABNORMAL HIGH (ref 70–99)
Glucose-Capillary: 330 mg/dL — ABNORMAL HIGH (ref 70–99)

## 2024-02-27 LAB — CYTOLOGY - NON PAP

## 2024-02-27 MED ORDER — SPIRONOLACTONE 25 MG PO TABS
25.0000 mg | ORAL_TABLET | Freq: Every day | ORAL | Status: DC
  Administered 2024-02-27 – 2024-02-28 (×2): 25 mg via ORAL
  Filled 2024-02-27 (×2): qty 1

## 2024-02-27 MED ORDER — FUROSEMIDE 10 MG/ML IJ SOLN
80.0000 mg | Freq: Once | INTRAMUSCULAR | Status: AC
  Administered 2024-02-27: 80 mg via INTRAVENOUS
  Filled 2024-02-27: qty 8

## 2024-02-27 NOTE — Progress Notes (Addendum)
 HD#2 SUBJECTIVE:  Patient Summary: Bob Warren is a 78 y.o. with a pertinent PMH of GAVE, myasthenia gravis, HFpEF (60-65%), HTN, HLD, T2DM, GERD, , who presented with abdominal distention, bilateral leg swelling, and shortness of breath and admitted for hypervolemia in the setting of ascites.   Overnight Events: none  Interim History: Patient evaluated at bedside. Today complains of low back pain, but has not received any medication yet for it this morning. He notes a history of lumbar and right hip pain from an accident many years ago, which flares up and this lower back pain today feels similar to that. He denies chest pain, shortness of breath, or difficulty breathing. He notes two BM yesterday which he denies being dark or bloody. He requests that the plan today be discussed with his step-daughter Joen again.   OBJECTIVE:  Vital Signs: Vitals:   02/26/24 2053 02/26/24 2358 02/27/24 0500 02/27/24 0747  BP: (!) 130/58 124/67  130/68  Pulse: (!) 104 100  89  Resp: 17 17  19   Temp: 99.2 F (37.3 C) 99.5 F (37.5 C)  97.6 F (36.4 C)  TempSrc: Oral Oral  Oral  SpO2: 99% 99%  99%  Weight:   86.1 kg   Height:       Supplemental O2: Room Air SpO2: 99 %  Filed Weights   02/24/24 1437 02/26/24 0408 02/27/24 0500  Weight: 98 kg 86.1 kg 86.1 kg     Intake/Output Summary (Last 24 hours) at 02/27/2024 1120 Last data filed at 02/27/2024 0902 Gross per 24 hour  Intake 354 ml  Output 450 ml  Net -96 ml   Net IO Since Admission: 113 mL [02/27/24 1120]  Physical Exam: Physical Exam  Patient Lines/Drains/Airways Status     Active Line/Drains/Airways     Name Placement date Placement time Site Days   Peripheral IV 02/25/24 20 G Left Antecubital 02/25/24  1700  Antecubital  1            Pertinent labs and imaging:      Latest Ref Rng & Units 02/27/2024    1:55 AM 02/26/2024   12:55 PM 02/26/2024    1:56 AM  CBC  WBC 4.0 - 10.5 K/uL 5.2  6.5  5.3   Hemoglobin 13.0 -  17.0 g/dL 7.9  9.5  7.6   Hematocrit 39.0 - 52.0 % 24.9  30.0  24.7   Platelets 150 - 400 K/uL 108  121  103        Latest Ref Rng & Units 02/27/2024    1:55 AM 02/26/2024    1:56 AM 02/24/2024    3:12 PM  CMP  Glucose 70 - 99 mg/dL 722  804  845   BUN 8 - 23 mg/dL 30  28  18    Creatinine 0.61 - 1.24 mg/dL 8.28  8.20  8.22   Sodium 135 - 145 mmol/L 134  133  129   Potassium 3.5 - 5.1 mmol/L 3.6  4.6  5.1   Chloride 98 - 111 mmol/L 101  100  98   CO2 22 - 32 mmol/L 24  23  20    Calcium  8.9 - 10.3 mg/dL 8.6  8.7  8.8   Total Protein 6.5 - 8.1 g/dL 5.1  5.6  7.2   Total Bilirubin 0.0 - 1.2 mg/dL 0.7  0.8  1.1   Alkaline Phos 38 - 126 U/L 50  52  87   AST 15 - 41 U/L 14  16  30   ALT 0 - 44 U/L 12  13  20      No results found.   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    Plan: #Decompensated cirrhosis suspect from [facility] #History of esophageal varices #HFpEF (60-65%) Presented with recurrent ascites and bilateral LE edema. No signs of bleeding at this time. A&O x3 without current signs of encephalopathy. Paracentesis 7/30 was done and removed 9.4 L of straw colored fluid. Albumin  75g was given post-draw. Patient tolerated  well. GI was consulted for possible TIPS procedure. GI not recommending TIPS at this time since the patient is not currently requiring frequent (1-2x/month) therapeutic paracentesis despite diuretics. Due to patient's diastolic heart failure, relative contraindication to TIPs. Slowly increase diuretics and intensify sodium restriction and more regular therapeutic paracentesis every 6 weeks to avoid large volumes being removed at once, which can further stress the kidneys.  Recommend spironolactone  50mg  daily in addition to patient's home Lasix  40mg  Bid. They will arrange outpatient follow up to titrate diuretics and to repeat labs in 2 weeks.  -IV Lasix  80 mg and Spironolactone  25mg  today -Strict I&Os, daily weights -Trend BMP and CBC -Senokot and MiraLAX  PRN  #Hyponatremia Admission Na 129, secondary to hypervolemia. Na 134 today.  Alert and oriented. Patient still has some pitting edema up to the knee and distal portion of the thigh. Volume overloaded, getting continued diuresis. Will monitor sodium level. -Trend BMP  #CKD Stage 3 Serum Cr 1.77 with GFR 39 on admission. Scr today 1.71. Last Scr at discharge 12/2023 was 1.62 with GFR 43. Appears baseline. -Trend renal function  #T2DM, controlled Last A1c 6.5% in June. Noted at last hospitalization metformin was discontinued from home med list. -CBG monitoring -Very Sensitive SSI with meals  #Gastric Antral Vascular Ectasia (GAVE) #Iron Deficiency Anemia Hemoglobin on admission 9.9 which is improved from discharge in June. Patient denies any signs of GI bleed at this time. Has been taking his iron supplement. Hgb today 7.9, HCT 24.7. Denies hematochezia, melena. WBC normal now 5.3 from 11.7, but has thrombocytopenia. -Monitor CBC and signs of bleeding -midday CBC to assess for further drops in Hgb. -If Hgb <7, will transfuse -If concerns of GI bleed will need to start ceftriaxone.  #Myasthenia Gravis Chronic. No acute flares. Not currently  on therapy. Will monitor for symptom development.   #HTN #HLD BP normotensive on arrival, mildly hypertensive. Receiving Lasix  at this time. PTA on spironolactone  25mg , Lasix  40mg  BID along with diltiazem  120mg .  -Continue diuresis with IV Lasix , spironolactone  25 daily -Hold home atorvastatin  -Diltiazem  120 mg daily  #Chronic back pain #Chronic right hip pain Chronic. CT hip in 2024 showed severe right hip degenerative changes. PTA on tramadol  50mg  usually taken once to twice a day  as needed. -Voltaren  gel, heating pad -Home Tramadol  50mg  BID PRN   Best Practice: Diet: Cardiac diet IVF: Fluids: none VTE: enoxaparin  (LOVENOX ) injection 40 mg Start: 02/25/24 1100 Code: Full  Disposition planning: Therapy Recs: Pending,  Family Contact: stepdaughter DISPO: Anticipated discharge in 1-2 days to Home pending clinical improvement.  Signature:  Liat Mayol [facility]  11:20 AM, 02/27/2024  On Call pager [phone]

## 2024-02-27 NOTE — Plan of Care (Signed)

## 2024-02-27 NOTE — Progress Notes (Signed)
 Mobility Specialist: Progress Note   02/27/24 1157  Mobility  Activity Ambulated with assistance  Level of Assistance Contact guard assist, steadying assist  Assistive Device Front wheel walker  Distance Ambulated (ft) 20 ft  Activity Response Tolerated well  Mobility Referral Yes  Mobility visit 1 Mobility  Mobility Specialist Start Time (ACUTE ONLY) 1002  Mobility Specialist Stop Time (ACUTE ONLY) 1029  Mobility Specialist Time Calculation (min) (ACUTE ONLY) 27 min    Pt received in bed, agreeable to mobility session after pain meds. MinA for bed mobility to assist with trunk elevation. MinA for STS. CGA for ambulation. Distance limited d/t low back pain and general weakness. Agreed to sit up in the chair for awhile. Left in chair with all needs met, call bell in reach.   Ileana Lute Mobility Specialist Please contact via SecureChat or Rehab office at [phone]

## 2024-02-27 NOTE — Telephone Encounter (Signed)
-----   Message from Glendia FORBES Holt sent at 02/26/2024  7:51 PM EDT ----- Regarding: Hospital follow up Team,  Please book office follow up with Dr. San or APP in the next month to follow up on ascites.  Patient admitted twice in the past 2 months with large volume ascites.  Has CKD which limits diuretic dosing. Primary team queried whether TIPS candidate, but patient has CHF, so unlikely to be good candidate.  May require more frequent adjustments of diuretics and reinforcement of low sodium diet and outpatient therapeutic paracentesis.  Please order CMP to be drawn in 2 weeks  Thanks

## 2024-02-27 NOTE — Progress Notes (Signed)
 OT Cancellation Note  Patient Details Name: Bob Warren MRN: [id number] DOB: [birth year]   Cancelled Treatment:    Reason Eval/Treat Not Completed: Other (comment) Attempted to see pt for OT treatment this afternoon. Pt politely declined participation. Will continue to follow pt and have therapy work with him when able.   Leita Howell, OTR/L,CBIS  Supplemental OT - [facility] and [facility] Secure Chat Preferred   02/27/2024, 3:25 PM

## 2024-02-27 NOTE — Telephone Encounter (Signed)
 OV scheduled for 8/28 at 2:30 pm with [place], [place]. Lab order in. Will contact pt once discharged.

## 2024-02-28 DIAGNOSIS — K7460 Unspecified cirrhosis of liver: Secondary | ICD-10-CM | POA: Diagnosis not present

## 2024-02-28 DIAGNOSIS — Z9889 Other specified postprocedural states: Secondary | ICD-10-CM

## 2024-02-28 DIAGNOSIS — R188 Other ascites: Secondary | ICD-10-CM | POA: Diagnosis not present

## 2024-02-28 LAB — GLUCOSE, CAPILLARY
Glucose-Capillary: 269 mg/dL — ABNORMAL HIGH (ref 70–99)
Glucose-Capillary: 249 mg/dL — ABNORMAL HIGH (ref 70–99)
Glucose-Capillary: 233 mg/dL — ABNORMAL HIGH (ref 70–99)
Glucose-Capillary: 247 mg/dL — ABNORMAL HIGH (ref 70–99)
Glucose-Capillary: 273 mg/dL — ABNORMAL HIGH (ref 70–99)

## 2024-02-28 LAB — RENAL FUNCTION PANEL
Albumin: 2.4 g/dL — ABNORMAL LOW (ref 3.5–5.0)
Anion gap: 10 (ref 5–15)
BUN: 32 mg/dL — ABNORMAL HIGH (ref 8–23)
CO2: 25 mmol/L (ref 22–32)
Calcium: 8.6 mg/dL — ABNORMAL LOW (ref 8.9–10.3)
Chloride: 98 mmol/L (ref 98–111)
Creatinine, Ser: 1.71 mg/dL — ABNORMAL HIGH (ref 0.61–1.24)
GFR, Estimated: 41 mL/min — ABNORMAL LOW (ref >60)
Glucose, Bld: 288 mg/dL — ABNORMAL HIGH (ref 70–99)
Phosphorus: 3.6 mg/dL (ref 2.5–4.6)
Potassium: 3.5 mmol/L (ref 3.5–5.1)
Sodium: 133 mmol/L — ABNORMAL LOW (ref 135–145)

## 2024-02-28 LAB — CBC
HCT: 28 % — ABNORMAL LOW (ref 39.0–52.0)
Hemoglobin: 9 g/dL — ABNORMAL LOW (ref 13.0–17.0)
MCH: 25.7 pg — ABNORMAL LOW (ref 26.0–34.0)
MCHC: 32.1 g/dL (ref 30.0–36.0)
MCV: 80 fL (ref 80.0–100.0)
Platelets: 142 K/uL — ABNORMAL LOW (ref 150–400)
RBC: 3.5 MIL/uL — ABNORMAL LOW (ref 4.22–5.81)
RDW: 16.3 % — ABNORMAL HIGH (ref 11.5–15.5)
WBC: 7.9 K/uL (ref 4.0–10.5)
nRBC: 0 % (ref 0.0–0.2)

## 2024-02-28 LAB — BODY FLUID CULTURE W GRAM STAIN
Culture: NO GROWTH
Gram Stain: NONE SEEN

## 2024-02-28 MED ORDER — INSULIN GLARGINE-YFGN 100 UNIT/ML ~~LOC~~ SOLN
5.0000 [IU] | Freq: Every day | SUBCUTANEOUS | Status: DC
  Administered 2024-02-28 – 2024-02-29 (×2): 5 [IU] via SUBCUTANEOUS
  Filled 2024-02-28 (×3): qty 0.05

## 2024-02-28 MED ORDER — SPIRONOLACTONE 25 MG PO TABS
50.0000 mg | ORAL_TABLET | Freq: Every day | ORAL | Status: DC
  Administered 2024-02-29 – 2024-03-08 (×10): 50 mg via ORAL
  Filled 2024-02-28 (×9): qty 2

## 2024-02-28 MED ORDER — FUROSEMIDE 40 MG PO TABS
40.0000 mg | ORAL_TABLET | Freq: Two times a day (BID) | ORAL | Status: DC
  Administered 2024-02-28 – 2024-03-08 (×22): 40 mg via ORAL
  Filled 2024-02-28 (×20): qty 1

## 2024-02-28 MED ORDER — SPIRONOLACTONE 25 MG PO TABS
25.0000 mg | ORAL_TABLET | Freq: Once | ORAL | Status: AC
  Administered 2024-02-28: 25 mg via ORAL
  Filled 2024-02-28: qty 1

## 2024-02-28 MED ORDER — LACTULOSE 10 GM/15ML PO SOLN
10.0000 g | Freq: Three times a day (TID) | ORAL | Status: DC
  Administered 2024-02-28 (×2): 10 g via ORAL
  Filled 2024-02-28 (×2): qty 15

## 2024-02-28 NOTE — Progress Notes (Signed)
 Mobility Specialist Progress Note:   02/28/24 1400  Mobility  Activity  (bed level exercise)  Level of Assistance Standby assist, set-up cues, supervision of patient - no hands on  Assistive Device None  Distance Ambulated (ft)  (pt adamantly refused)  Range of Motion/Exercises Active;Active Assistive;All extremities  Activity Response Tolerated fair  Mobility Referral Yes  Mobility visit 1 Mobility  Mobility Specialist Start Time (ACUTE ONLY) 1400  Mobility Specialist Stop Time (ACUTE ONLY) 1410  Mobility Specialist Time Calculation (min) (ACUTE ONLY) 10 min   Pt adamantly refused any OOB mobility at this time despite max encouragement. States I'll atleast be here for two more days. Performed multiple bed level exercises fairly. Left with all needs met. Right now, pt agrees to OOB mobility tomorrow.  Therisa Rana Mobility Specialist Please contact via SecureChat or  Rehab office at [phone]

## 2024-02-28 NOTE — Progress Notes (Signed)
 Called to bedside as there was concern for patient becoming altered.   Arrived to bedside, and patient was with his daughter and wife.  He is able to recognize his wife, but thinks his daughter is his daughter-in-law.  He is able to follow all of my instructions, but was delayed in following instructions.  It took multiple prompts to have him put his iPad down.  At baseline, per daughter, he is able to follow instructions, respond quickly, and have full attention to conversation.  On my exam, he did not engage in conversation.  He had delayed responses.  He has asterixis on exam.  I have a high suspicion for hepatic encephalopathy.  Will start lactulose .  Goal bowel movements 3 BMs a day   Messaged nurse, and informed nurse of plan.  I updated family about the plan at bedside.  I did instruct the nurse to let the night nurse know that patient has a high risk of soiling himself, and can be high risk to develop wounds, so nursing staff should keep a close eye on patient to make sure he does not soil himself once starting lactulose .

## 2024-02-28 NOTE — Progress Notes (Addendum)
 HD#2 SUBJECTIVE:  Patient Summary: Bob Warren is a 78 y.o. with a pertinent PMH of GAVE, myasthenia gravis, HFpEF (60-65%), HTN, HLD, T2DM, GERD, , who presented with abdominal distention, bilateral leg swelling, and shortness of breath and admitted for volume overload due to decompensated cirrhosis.  Overnight Events: none  Interim History: Patient is doing well this morning without any new or worsening complaints.  No dyspnea overnight and he did have a bowel movement that was [place].  His abdominal swelling is stable and lower extremity edema has continued to improve.  OBJECTIVE:  Vital Signs: Vitals:   02/27/24 1211 02/27/24 1553 02/27/24 1947 02/28/24 0533  BP: 135/66 118/64 126/67 134/78  Pulse: 91 93 94 94  Resp: 19 18 15 15   Temp: 98 F (36.7 C) 98.1 F (36.7 C) 98.3 F (36.8 C) 97.6 F (36.4 C)  TempSrc:      SpO2: 99% 95% 97% 97%  Weight:      Height:       Supplemental O2: Room Air SpO2: 97 %  Filed Weights   02/24/24 1437 02/26/24 0408 02/27/24 0500  Weight: 98 kg 86.1 kg 86.1 kg     Intake/Output Summary (Last 24 hours) at 02/28/2024 0603 Last data filed at 02/27/2024 2043 Gross per 24 hour  Intake 560 ml  Output 1500 ml  Net -940 ml   Net IO Since Admission: -945 mL [02/28/24 0603]  Physical Exam: Constitutional: Chronically ill appearing male laying in bed. In no acute distress. Cardio:Regular rate and rhythm.  Pulm:Normal work of breathing on room air. Abdomen: Soft, nontender, distended but not tense, positive bowel sounds MSK: Trace lower extremity edema bilaterally Skin:Warm and dry. Neuro:Alert and oriented x3. No focal deficit noted. Psych:Pleasant mood and affect.  Patient Lines/Drains/Airways Status     Active Line/Drains/Airways     Name Placement date Placement time Site Days   Peripheral IV 02/25/24 20 G Left Antecubital 02/25/24  1700  Antecubital  1            Pertinent labs and imaging:      Latest Ref Rng & Units  02/28/2024    2:39 AM 02/27/2024    2:22 PM 02/27/2024    1:55 AM  CBC  WBC 4.0 - 10.5 K/uL 7.9  6.5  5.2   Hemoglobin 13.0 - 17.0 g/dL 9.0  8.8  7.9   Hematocrit 39.0 - 52.0 % 28.0  28.3  24.9   Platelets 150 - 400 K/uL 142  122  108        Latest Ref Rng & Units 02/28/2024    2:39 AM 02/27/2024    1:55 AM 02/26/2024    1:56 AM  CMP  Glucose 70 - 99 mg/dL 711  722  804   BUN 8 - 23 mg/dL 32  30  28   Creatinine 0.61 - 1.24 mg/dL 8.28  8.28  8.20   Sodium 135 - 145 mmol/L 133  134  133   Potassium 3.5 - 5.1 mmol/L 3.5  3.6  4.6   Chloride 98 - 111 mmol/L 98  101  100   CO2 22 - 32 mmol/L 25  24  23    Calcium  8.9 - 10.3 mg/dL 8.6  8.6  8.7   Total Protein 6.5 - 8.1 g/dL  5.1  5.6   Total Bilirubin 0.0 - 1.2 mg/dL  0.7  0.8   Alkaline Phos 38 - 126 U/L  50  52   AST 15 - 41  U/L  14  16   ALT 0 - 44 U/L  12  13     No results found.   ASSESSMENT/PLAN:  Assessment: Principal Problem:   Cirrhosis of liver with ascites (HCC) Active Problems:   Myasthenia gravis (HCC)   DM (diabetes mellitus) (HCC)   Hypertension   Esophageal varices without bleeding (HCC)   GAVE (gastric antral vascular ectasia)   Portal hypertensive gastropathy (HCC)   Ascites   Hypervolemia   (HFpEF) heart failure with preserved ejection fraction (HCC)   Atrial fibrillation (HCC)   CKD (chronic kidney disease) stage 3, GFR 30-59 ml/min (HCC)  Bob Warren is a 77y.o. 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  Plan: #Decompensated cirrhosis suspect from [facility] #History of esophageal varices #HFpEF (60-65%) Continues to improve after receiving IV Lasix  again yesterday and starting spironolactone .  Blood pressures have also held without significant decrease.   Continues to have normal colored bowel movements.  Lower extremity edema is almost gone and his abdominal swelling is stable.  He has follow-up with GI on 8/28 and would likely get every 6 week paracenteses outpatient. - Start Lasix  40 mg twice daily and increase spironolactone  to 50 mg daily  #T2DM, controlled Last A1c 6.5% in June.  Blood sugars have been high here from 250-350. - Add Semglee  5 units daily, if fasting CBG tomorrow morning above 180 increase to 10 for discharge -Very Sensitive SSI with meals while admitted  #HTN #HLD Continues to be normotensive with the addition of 25 mg spironolactone . - Lasix  and spironolactone  as above - Continue diltiazem  120 mg daily  #Hyponatremia, resolved  #CKD Stage 3 Creatinine stable at baseline 1.71 today  #Gastric Antral Vascular Ectasia (GAVE) #Iron Deficiency Anemia Hemoglobin stable today at 9.0 and no signs of active bleeding.  #Myasthenia Gravis Stable without acute flares off medication.  #Chronic back pain #Chronic right hip pain Chronic. CT hip in 2024 showed severe right hip degenerative changes. PTA on tramadol  50mg  usually taken once to twice a day as needed. -Voltaren  gel, heating pad -Home Tramadol  50mg  BID PRN  Best Practice: Diet: Cardiac diet IVF: Fluids: none VTE: enoxaparin  (LOVENOX ) injection 40 mg Start: 02/25/24 1100 Code: Full  Disposition planning: Therapy Recs: Pending,  Family Contact: stepdaughter DISPO: Anticipated discharge 0-1 days.  May discharge this afternoon if patient and family have a home return however may discharge tomorrow morning.  Signature:  [facility]  6:03 AM, 02/28/2024  On Call pager [phone]

## 2024-02-28 NOTE — Discharge Instructions (Addendum)
 Bob Warren,  You were recently admitted to [facility] for volume overload due to decompensated cirrhosis.  We treated you by drying fluid off of your abdomen and giving you diuretics to help you remove more fluid through your kidneys.  I am glad that you are well enough to go home and we have made some changes to your medications that are included in this packet.  Please follow-up with your primary care doctor within the next week and with gastroenterology on 03/25/2024 where they should be able to set up his paracentesis outpatient.  You should seek further medical care if you have any significant return of your symptoms or symptoms like dark or red bowel movements or bloody vomit.  Sincerely, Stirling Orton, DO

## 2024-02-28 NOTE — Plan of Care (Signed)

## 2024-02-29 DIAGNOSIS — R188 Other ascites: Secondary | ICD-10-CM | POA: Diagnosis not present

## 2024-02-29 DIAGNOSIS — K7460 Unspecified cirrhosis of liver: Secondary | ICD-10-CM | POA: Diagnosis not present

## 2024-02-29 LAB — CBC WITH DIFFERENTIAL/PLATELET
Abs Immature Granulocytes: 0.02 K/uL (ref 0.00–0.07)
Basophils Absolute: 0 K/uL (ref 0.0–0.1)
Basophils Relative: 1 %
Eosinophils Absolute: 0.3 K/uL (ref 0.0–0.5)
Eosinophils Relative: 4 %
HCT: 28.4 % — ABNORMAL LOW (ref 39.0–52.0)
Hemoglobin: 9 g/dL — ABNORMAL LOW (ref 13.0–17.0)
Immature Granulocytes: 0 %
Lymphocytes Relative: 12 %
Lymphs Abs: 0.7 K/uL (ref 0.7–4.0)
MCH: 25.6 pg — ABNORMAL LOW (ref 26.0–34.0)
MCHC: 31.7 g/dL (ref 30.0–36.0)
MCV: 80.7 fL (ref 80.0–100.0)
Monocytes Absolute: 0.5 K/uL (ref 0.1–1.0)
Monocytes Relative: 9 %
Neutro Abs: 4.2 K/uL (ref 1.7–7.7)
Neutrophils Relative %: 74 %
Platelets: 123 K/uL — ABNORMAL LOW (ref 150–400)
RBC: 3.52 MIL/uL — ABNORMAL LOW (ref 4.22–5.81)
RDW: 16.5 % — ABNORMAL HIGH (ref 11.5–15.5)
WBC: 5.7 K/uL (ref 4.0–10.5)
nRBC: 0 % (ref 0.0–0.2)

## 2024-02-29 LAB — GLUCOSE, CAPILLARY
Glucose-Capillary: 188 mg/dL — ABNORMAL HIGH (ref 70–99)
Glucose-Capillary: 144 mg/dL — ABNORMAL HIGH (ref 70–99)
Glucose-Capillary: 236 mg/dL — ABNORMAL HIGH (ref 70–99)
Glucose-Capillary: 259 mg/dL — ABNORMAL HIGH (ref 70–99)

## 2024-02-29 LAB — BASIC METABOLIC PANEL WITH GFR
Anion gap: 7 (ref 5–15)
BUN: 27 mg/dL — ABNORMAL HIGH (ref 8–23)
CO2: 25 mmol/L (ref 22–32)
Calcium: 8.3 mg/dL — ABNORMAL LOW (ref 8.9–10.3)
Chloride: 101 mmol/L (ref 98–111)
Creatinine, Ser: 1.59 mg/dL — ABNORMAL HIGH (ref 0.61–1.24)
GFR, Estimated: 44 mL/min — ABNORMAL LOW (ref >60)
Glucose, Bld: 185 mg/dL — ABNORMAL HIGH (ref 70–99)
Potassium: 3.2 mmol/L — ABNORMAL LOW (ref 3.5–5.1)
Sodium: 133 mmol/L — ABNORMAL LOW (ref 135–145)

## 2024-02-29 MED ORDER — LIDOCAINE 5 % EX PTCH
1.0000 | MEDICATED_PATCH | CUTANEOUS | Status: DC
  Administered 2024-02-29 – 2024-03-08 (×10): 1 via TRANSDERMAL
  Filled 2024-02-29 (×9): qty 1

## 2024-02-29 MED ORDER — LACTULOSE 10 GM/15ML PO SOLN
10.0000 g | Freq: Three times a day (TID) | ORAL | Status: DC
  Administered 2024-03-01 – 2024-03-02 (×4): 10 g via ORAL
  Filled 2024-02-29 (×4): qty 15

## 2024-02-29 MED ORDER — LACTULOSE 10 GM/15ML PO SOLN: 10.0000 g | Freq: Three times a day (TID) | ORAL | Status: DC

## 2024-02-29 MED ORDER — LACTULOSE 10 GM/15ML PO SOLN
20.0000 g | Freq: Three times a day (TID) | ORAL | Status: DC
  Administered 2024-02-29: 20 g via ORAL
  Filled 2024-02-29: qty 30

## 2024-02-29 MED ORDER — LACTULOSE 10 GM/15ML PO SOLN
20.0000 g | ORAL | Status: AC
  Administered 2024-02-29 (×3): 20 g via ORAL
  Filled 2024-02-29 (×3): qty 30

## 2024-02-29 MED ORDER — LACTULOSE 10 GM/15ML PO SOLN: 20.0000 g | Freq: Three times a day (TID) | ORAL | Status: DC

## 2024-02-29 NOTE — Progress Notes (Addendum)
 HD#2 SUBJECTIVE:  Patient Summary: Bob Warren is a 78 y.o. with a pertinent PMH of GAVE, myasthenia gravis, HFpEF (60-65%), HTN, HLD, T2DM, GERD, who presented with abdominal distention, bilateral leg swelling, and shortness of breath and admitted for volume overload due to decompensated cirrhosis.  Overnight Events: none  Interim History: Patient is doing well this morning without any new or worsening complaints.  No dyspnea overnight and he did have a bowel movement that was [place].  His abdominal swelling is stable and lower extremity edema has continued to improve. He is slower to answer questions but remains AxO x4. He is stating he has continued BLE swelling and pain in his arms that feels tight.  OBJECTIVE:  Vital Signs: Vitals:   02/28/24 2001 02/29/24 0122 02/29/24 0404 02/29/24 0837  BP: (!) 141/63 134/66 139/70 131/74  Pulse: 88 87 86 84  Resp: 16 17 18 18   Temp: 98.3 F (36.8 C) 97.9 F (36.6 C) 98 F (36.7 C) 98.1 F (36.7 C)  TempSrc:  Oral Oral Oral  SpO2: 100% 96% 98% 96%  Weight:   85.4 kg   Height:       Supplemental O2: Room Air SpO2: 96 %  Filed Weights   02/26/24 0408 02/27/24 0500 02/29/24 0404  Weight: 86.1 kg 86.1 kg 85.4 kg     Intake/Output Summary (Last 24 hours) at 02/29/2024 1051 Last data filed at 02/29/2024 0139 Gross per 24 hour  Intake 236 ml  Output 1900 ml  Net -1664 ml   Net IO Since Admission: -2,609 mL [02/29/24 1051]  Physical Exam: Constitutional: Chronically ill appearing male laying in bed. In no acute distress. Cardio:Regular rate and rhythm.  Pulm:Normal work of breathing on room air. Abdomen: Soft, nontender, distended but not tense, positive bowel sounds MSK: Trace lower extremity edema bilaterally Skin:Warm and dry. Neuro:Alert and oriented x3. No focal deficit noted. Asterixis present.  Psych: Pleasant but tired mood and affect.  Patient Lines/Drains/Airways Status     Active Line/Drains/Airways     Name  Placement date Placement time Site Days   Peripheral IV 02/25/24 20 G Left Antecubital 02/25/24  1700  Antecubital  1            Pertinent labs and imaging:      Latest Ref Rng & Units 02/29/2024    8:46 AM 02/28/2024    2:39 AM 02/27/2024    2:22 PM  CBC  WBC 4.0 - 10.5 K/uL 5.7  7.9  6.5   Hemoglobin 13.0 - 17.0 g/dL 9.0  9.0  8.8   Hematocrit 39.0 - 52.0 % 28.4  28.0  28.3   Platelets 150 - 400 K/uL 123  142  122        Latest Ref Rng & Units 02/29/2024    8:46 AM 02/28/2024    2:39 AM 02/27/2024    1:55 AM  CMP  Glucose 70 - 99 mg/dL 814  711  722   BUN 8 - 23 mg/dL 27  32  30   Creatinine 0.61 - 1.24 mg/dL 8.40  8.28  8.28   Sodium 135 - 145 mmol/L 133  133  134   Potassium 3.5 - 5.1 mmol/L 3.2  3.5  3.6   Chloride 98 - 111 mmol/L 101  98  101   CO2 22 - 32 mmol/L 25  25  24    Calcium  8.9 - 10.3 mg/dL 8.3  8.6  8.6   Total Protein 6.5 - 8.1 g/dL  5.1   Total Bilirubin 0.0 - 1.2 mg/dL   0.7   Alkaline Phos 38 - 126 U/L   50   AST 15 - 41 U/L   14   ALT 0 - 44 U/L   12     No results found.   ASSESSMENT/PLAN:  Assessment: Principal Problem:   Cirrhosis of liver with ascites (HCC) Active Problems:   Myasthenia gravis (HCC)   DM (diabetes mellitus) (HCC)   Hypertension   Esophageal varices without bleeding (HCC)   GAVE (gastric antral vascular ectasia)   Portal hypertensive gastropathy (HCC)   Ascites   Hypervolemia   (HFpEF) heart failure with preserved ejection fraction (HCC)   Atrial fibrillation (HCC)   CKD (chronic kidney disease) stage 3, GFR 30-59 ml/min (HCC)  Bob Warren is a 77y.o. 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  Plan: #Altered Mental Status Patient became altered yesterday afternoon  with decreased attention, delayed responses, and asterixis on exam. High suspicion for hepatic encephalopathy. Asterixis present. Lactulose  started yesterday with a goal of 3 BM a day.  - Increase to lactulose  20g Q2H until BM today - Then lactulose  20g TID after   #Decompensated cirrhosis suspect from [facility] #History of esophageal varices #HFpEF (60-65%) Continues to improve after receiving PO Lasix  again yesterday and starting spironolactone .  Blood pressures have also held without significant decrease.  Continues to have normal colored bowel movements.  Lower extremity edema is almost gone and his abdominal swelling is stable.  He has follow-up with GI on 8/28 and would likely get every 6 week paracenteses outpatient. - Continue PO Lasix  40 mg twice daily and increase spironolactone  to 50 mg daily  #T2DM, controlled Last A1c 6.5% in June.  Blood sugars have been high here from 250-350. CBG today 188, but will monitor tomorrow for the need to increase dose. - Add Semglee  5 units daily, if fasting CBG tomorrow morning above 180 increase to 10 for discharge -Very Sensitive SSI with meals while admitted  #HTN #HLD Continues to be normotensive with the addition of 50 mg spironolactone . - Lasix  and spironolactone  as above - Continue diltiazem  120 mg daily  #Hyponatremia, resolved  #CKD Stage 3 Creatinine stable at baseline 1.59 today  #Gastric Antral Vascular Ectasia (GAVE) #Iron Deficiency Anemia Hemoglobin stable yesterday at 9.0 and no signs of active bleeding.  #Myasthenia Gravis Stable without acute flares off medication.  #Chronic back pain #Chronic right hip pain Chronic. CT hip in 2024 showed severe right hip degenerative changes. PTA on tramadol  50mg  usually taken once to twice a day as needed.  -Voltaren  gel, heating pad - Added lidocaine  patch for further pain management  -Home Tramadol  50mg  BID PRN  Best Practice: Diet: Cardiac diet IVF: Fluids: none VTE: enoxaparin   (LOVENOX ) injection 40 mg Start: 02/25/24 1100 Code: Full  Disposition planning: Therapy Recs: Pending,  Family Contact: stepdaughter DISPO: Anticipated discharge 0-1 days.  May discharge this afternoon if patient and family have a home return however may discharge tomorrow morning.  Signature:  Kolston [facility]ount [facility]  10:51 AM, 02/29/2024  On Call pager [phone]

## 2024-02-29 NOTE — Progress Notes (Signed)
 Mobility Specialist Progress Note:   02/29/24 1200  Mobility  Activity Ambulated with assistance  Level of Assistance Contact guard assist, steadying assist  Assistive Device Front wheel walker  Distance Ambulated (ft) 25 ft  Activity Response Tolerated fair  Mobility Referral Yes  Mobility visit 1 Mobility  Mobility Specialist Start Time (ACUTE ONLY) 1200  Mobility Specialist Stop Time (ACUTE ONLY) 1215  Mobility Specialist Time Calculation (min) (ACUTE ONLY) 15 min   Pt agreeable to mobility session with encouragement. Required only CGA to stand and ambulate short room distance, however limited by weakness and fatigue. Pt c/o lower back pain with OOB mobility. Declined sitting up in chair, left with all needs met, bed alarm on.  Therisa Rana Mobility Specialist Please contact via SecureChat or  Rehab office at [phone]

## 2024-02-29 NOTE — Plan of Care (Signed)

## 2024-02-29 NOTE — Progress Notes (Signed)
 Spoke with the daughter at length about her dad and the disease process, along with Dr. Nooruddin. Joen, daughter, is very aware of his diagnosis and understands what  can come with that. She states she would like a palliative consult so she can just know all of their options. Dr. Nooruddin made aware and will order. Daughter would like to be notified when palliative plans to consult so she can be there, so please call her and not the wife as she is her mothers ride to get here.   Sherry: [phone]

## 2024-02-29 NOTE — Plan of Care (Signed)

## 2024-03-01 ENCOUNTER — Inpatient Hospital Stay (HOSPITAL_COMMUNITY)

## 2024-03-01 DIAGNOSIS — D509 Iron deficiency anemia, unspecified: Secondary | ICD-10-CM | POA: Diagnosis present

## 2024-03-01 DIAGNOSIS — K7682 Hepatic encephalopathy: Secondary | ICD-10-CM | POA: Diagnosis present

## 2024-03-01 DIAGNOSIS — Z7189 Other specified counseling: Secondary | ICD-10-CM | POA: Diagnosis not present

## 2024-03-01 DIAGNOSIS — K7469 Other cirrhosis of liver: Secondary | ICD-10-CM | POA: Diagnosis present

## 2024-03-01 DIAGNOSIS — K766 Portal hypertension: Secondary | ICD-10-CM | POA: Diagnosis present

## 2024-03-01 DIAGNOSIS — Z66 Do not resuscitate: Secondary | ICD-10-CM | POA: Diagnosis present

## 2024-03-01 DIAGNOSIS — I130 Hypertensive heart and chronic kidney disease with heart failure and stage 1 through stage 4 chronic kidney disease, or unspecified chronic kidney disease: Secondary | ICD-10-CM | POA: Diagnosis present

## 2024-03-01 DIAGNOSIS — Z515 Encounter for palliative care: Secondary | ICD-10-CM | POA: Diagnosis not present

## 2024-03-01 DIAGNOSIS — I509 Heart failure, unspecified: Secondary | ICD-10-CM

## 2024-03-01 DIAGNOSIS — E871 Hypo-osmolality and hyponatremia: Secondary | ICD-10-CM | POA: Diagnosis present

## 2024-03-01 DIAGNOSIS — E1122 Type 2 diabetes mellitus with diabetic chronic kidney disease: Secondary | ICD-10-CM | POA: Diagnosis present

## 2024-03-01 DIAGNOSIS — E8770 Fluid overload, unspecified: Secondary | ICD-10-CM | POA: Diagnosis present

## 2024-03-01 DIAGNOSIS — Z794 Long term (current) use of insulin: Secondary | ICD-10-CM | POA: Diagnosis not present

## 2024-03-01 DIAGNOSIS — G7000 Myasthenia gravis without (acute) exacerbation: Secondary | ICD-10-CM | POA: Diagnosis present

## 2024-03-01 DIAGNOSIS — R188 Other ascites: Secondary | ICD-10-CM | POA: Diagnosis present

## 2024-03-01 DIAGNOSIS — K7460 Unspecified cirrhosis of liver: Secondary | ICD-10-CM | POA: Diagnosis not present

## 2024-03-01 DIAGNOSIS — D696 Thrombocytopenia, unspecified: Secondary | ICD-10-CM | POA: Diagnosis not present

## 2024-03-01 DIAGNOSIS — N183 Chronic kidney disease, stage 3 unspecified: Secondary | ICD-10-CM | POA: Diagnosis present

## 2024-03-01 DIAGNOSIS — I480 Paroxysmal atrial fibrillation: Secondary | ICD-10-CM | POA: Diagnosis present

## 2024-03-01 DIAGNOSIS — I5033 Acute on chronic diastolic (congestive) heart failure: Secondary | ICD-10-CM | POA: Diagnosis present

## 2024-03-01 DIAGNOSIS — I8510 Secondary esophageal varices without bleeding: Secondary | ICD-10-CM | POA: Diagnosis present

## 2024-03-01 DIAGNOSIS — G252 Other specified forms of tremor: Secondary | ICD-10-CM | POA: Diagnosis not present

## 2024-03-01 LAB — CBC
HCT: 27.5 % — ABNORMAL LOW (ref 39.0–52.0)
Hemoglobin: 8.6 g/dL — ABNORMAL LOW (ref 13.0–17.0)
MCH: 25.5 pg — ABNORMAL LOW (ref 26.0–34.0)
MCHC: 31.3 g/dL (ref 30.0–36.0)
MCV: 81.6 fL (ref 80.0–100.0)
Platelets: 166 K/uL (ref 150–400)
RBC: 3.37 MIL/uL — ABNORMAL LOW (ref 4.22–5.81)
RDW: 16.3 % — ABNORMAL HIGH (ref 11.5–15.5)
WBC: 7.2 K/uL (ref 4.0–10.5)
nRBC: 0 % (ref 0.0–0.2)

## 2024-03-01 LAB — BASIC METABOLIC PANEL WITH GFR
Anion gap: 9 (ref 5–15)
BUN: 29 mg/dL — ABNORMAL HIGH (ref 8–23)
CO2: 25 mmol/L (ref 22–32)
Calcium: 8.8 mg/dL — ABNORMAL LOW (ref 8.9–10.3)
Chloride: 102 mmol/L (ref 98–111)
Creatinine, Ser: 1.68 mg/dL — ABNORMAL HIGH (ref 0.61–1.24)
GFR, Estimated: 42 mL/min — ABNORMAL LOW (ref >60)
Glucose, Bld: 392 mg/dL — ABNORMAL HIGH (ref 70–99)
Potassium: 3.4 mmol/L — ABNORMAL LOW (ref 3.5–5.1)
Sodium: 136 mmol/L (ref 135–145)

## 2024-03-01 LAB — GLUCOSE, CAPILLARY
Glucose-Capillary: 270 mg/dL — ABNORMAL HIGH (ref 70–99)
Glucose-Capillary: 208 mg/dL — ABNORMAL HIGH (ref 70–99)
Glucose-Capillary: 311 mg/dL — ABNORMAL HIGH (ref 70–99)
Glucose-Capillary: 427 mg/dL — ABNORMAL HIGH (ref 70–99)

## 2024-03-01 MED ORDER — ORAL CARE MOUTH RINSE: 15.0000 mL | OROMUCOSAL | Status: DC | PRN

## 2024-03-01 MED ORDER — INSULIN GLARGINE-YFGN 100 UNIT/ML ~~LOC~~ SOLN
10.0000 [IU] | Freq: Every day | SUBCUTANEOUS | Status: DC
  Administered 2024-03-01 – 2024-03-02 (×2): 10 [IU] via SUBCUTANEOUS
  Filled 2024-03-01 (×2): qty 0.1

## 2024-03-01 NOTE — Plan of Care (Signed)

## 2024-03-01 NOTE — Consult Note (Addendum)
 Consultation Note Date: 03/01/2024   Patient Name: Bob Warren  DOB: 1946/06/02  MRN: [id number]  Age / Sex: 78 y.o., male  PCP: Rosan Mix, MD Referring Physician: Francesco Elsie NOVAK, MD  Reason for Consultation: Establishing goals of care  HPI/Patient Profile: 78 y.o. male  with past medical history of GAVE, myasthenia gravis, HFpEF (60-65%), HTN, HLD, T2DM, and GERD,  admitted on 02/24/2024 with shortness of breath and admitted for volume overload due to decompensated cirrhosis.    PMT has been consulted to assist with goals of care conversation. Patient/Family face treatment option decisions, advanced directive decisions and anticipatory care needs.   Visited patient today, we had a brief conversation with the patient, who appears comfortable and not in any distress. He is able to communicate his needs effectively. A more detailed discussion regarding his current medical condition and goals of care is still pending.  Family face treatment option decision, advance directive decisions and anticipatory care needs.   Clinical Assessment and Goals of Care:  We have reviewed medical records including EPIC notes, labs and imaging, assessed the patient and then met with stepdaughter to discuss diagnosis prognosis, GOC, EOL wishes, disposition and options.  We introduced Palliative Medicine as specialized medical care for people living with serious illness. It focuses on providing relief from the symptoms and stress of a serious illness. The goal is to improve quality of life for both the patient and the family.  Created space and opportunity for family to explore thoughts and feelings regarding patient's current medical condition.   We discussed a brief life review of the patient and then focused on their current illness.    Medical History Review and Family/Patient Understanding:  78 y.o. male  with past medical history of GAVE, myasthenia gravis, HFpEF  (60-65%), HTN, HLD, T2DM, and GERD,  admitted on 02/24/2024 with shortness of breath and admitted for volume overload due to decompensated cirrhosis.   We had a brief conversation with the patient, who appears comfortable and not in any distress. He is able to communicate his needs effectively. A more detailed discussion regarding his current medical condition and goals of care is still pending.  Social History: Patient lives with wife of 17 years. Step daughter reports patient has very string church support.   Functional and Nutritional State: Stepdaughter reports there is notably a steady decline in patient's functional status over the last few months. Patient has home health services including PT and nursing, from previous recent hospital hospitalization.  Palliative Symptoms: Generalized weakness, intermittent confusion,   Advance Directives: According to the stepdaughter, it is unclear whether advance directives are in place; this will need to be verified.  Code Status: Full code  Discussion: We met with the patient's stepdaughter, Joen Fuse, today to review the patient's current medical condition. The discussion focused on the patient's poor prognosis in the context of decompensated cirrhosis. We explored possible treatment options, addressing both short- and long-term goals, and reviewed the anticipated disease trajectory to help plan for future needs. The patient's current code status is full code, and we emphasized the importance of revisiting this decision soon given the overall disease burden.  Joen demonstrated increased understanding of the patient's illness and its likely progression, though she appeared understandably overwhelmed as we provided realistic expectations. We discussed the balance between quality of life and the continuation of aggressive interventions, highlighting the potential impact on the patient's quality of life. Joen acknowledged the importance of  prioritizing quality over quantity of life.  She also expressed concern for her mother, noting the emotional toll this situation is likely to have on her. Our plan is to arrange a discussion with the patient, without his wife present to explore his goals of care, wishes, and preferences regarding treatment options. Code status may also need to be addressed. Joen indicated her willingness to participate in this conversation if needed. We plan to have this detailed discussion with the patient on 03/02/2024.  At present, plan is to continue with current code status and treatment plan till further goals of care be discussed with patient.   The difference between aggressive medical intervention and comfort care was considered in light of the patient's goals of care. Hospice and Palliative Care services outpatient were explained and offered.   Discussed the importance of continued conversation with family and the medical providers regarding overall plan of care and treatment options, ensuring decisions are within the context of the patient's values and GOCs.   Questions and concerns were addressed.  Hard Choices booklet left for review. The family was encouraged to call with questions or concerns.  PMT will continue to support holistically.    SUMMARY OF RECOMMENDATIONS   Code Status: Full Code Symptom management: Per attending Plan to meet with the patient on 03/02/2024 to discuss his current medical condition, goals of care, and preferences regarding medical interventions, including code status. Stepdaughter has expressed willingness and is able to participate in this conversation. Provide psychosocial emotional support.  Continue with holistic palliative support.    Palliative Prophylaxis:  Aspiration, Bowel Regimen, Delirium Protocol, Oral Care, and Turn Reposition   Prognosis:  Prognosis is relatively poor given the functional and cognitive decline in the setting of decompensated cirrhosis,  advanced age, compounded with his multiple co morbidities.   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  Constitutional:      Appearance: He is well-developed.  HENT:     Head: Normocephalic and atraumatic.  Cardiovascular:     Rate and Rhythm: Normal rate and regular rhythm.  Abdominal:     General: There is distension.  Musculoskeletal:     Comments: Generalized weakness  Skin:    General: Skin is warm.  Neurological:     General: No focal deficit present.     Mental Status: He is alert.     Vital Signs: BP 133/70 (BP Location: Right Arm)   Pulse 84   Temp (!) 97.5 F (36.4 C)   Resp 17   Ht 5' 7 (1.702 m)   Wt 86.8 kg   SpO2 97%   BMI 29.97 kg/m  Pain Scale: 0-10   Pain Score: Asleep   SpO2: SpO2: 97 % O2 Device:SpO2: 97 % O2 Flow Rate: .    Palliative Assessment/Data: 40%    Total time: I spent 75 minutes in the care of the patient today in the above activities and documenting the encounter.   Kathlyne JULIANNA Tracie Mickey, NP  Palliative Medicine Team Team phone # [phone]  Thank you for allowing the Palliative Medicine Team to assist in  the care of this patient. Please utilize secure chat with additional questions, if there is no response within 30 minutes please call the above phone number.  Palliative Medicine Team providers are available by phone from 7am to 7pm daily and can be reached through the team cell phone.  Should this patient require assistance outside of these  hours, please call the patient's attending physician.

## 2024-03-01 NOTE — Progress Notes (Signed)
 HD#5 SUBJECTIVE:  Patient Summary: Bob Warren is a 78 y.o. with a pertinent PMH of GAVE, myasthenia gravis, HFpEF (60-65%), HTN, HLD, T2DM, GERD, who presented with abdominal distention, bilateral leg swelling, and shortness of breath and admitted for volume overload due to decompensated cirrhosis.  Overnight Events: Daughter was present at bedside yesterday, voicing concerns about her father's hospitalization. Palliative care consulted per night team.  Interim History: Patient is doing well this morning without any new or worsening complaints. Today reports feeling okay. Does not feel that the lidocaine  patch helped with his pain too much yesterday. Alert and oriented x4 to person, place, time, and family members. He reports that he slept well. He denies belly pain. He reports pain with palpation over lower extremities around distal tibial region. He denies any BM this morning, but reports having 3-4 BM yesterday that were normal in color.   OBJECTIVE:  Vital Signs: Vitals:   02/29/24 2356 03/01/24 0347 03/01/24 0500 03/01/24 0742  BP: 136/62 129/63  133/70  Pulse: 91 87  84  Resp: 18 17    Temp: 98.5 F (36.9 C) 98 F (36.7 C)  (!) 97.5 F (36.4 C)  TempSrc: Oral     SpO2: 97% 96%  97%  Weight:   86.8 kg   Height:       Supplemental O2: Room Air SpO2: 97 %  Filed Weights   02/27/24 0500 02/29/24 0404 03/01/24 0500  Weight: 86.1 kg 85.4 kg 86.8 kg     Intake/Output Summary (Last 24 hours) at 03/01/2024 1331 Last data filed at 03/01/2024 0400 Gross per 24 hour  Intake --  Output 500 ml  Net -500 ml   Net IO Since Admission: -3,509 mL [03/01/24 1331]  Physical Exam: Constitutional: Chronically ill appearing male laying in bed. In no acute distress. Cardio:Regular rate and rhythm.  Pulm: Increased work of breathing on room air. Bilateral lungs sound fluid-filled.  Abdomen: Soft, nontender, distended but not tense, positive bowel sounds MSK: Trace lower extremity edema  bilaterally Skin:Warm and dry. Neuro:Alert and oriented x4. No focal deficit noted.  Psych: Pleasant but tired mood and affect.  Patient Lines/Drains/Airways Status     Active Line/Drains/Airways     Name Placement date Placement time Site Days   Peripheral IV 02/25/24 20 G Left Antecubital 02/25/24  1700  Antecubital  1            Pertinent labs and imaging:      Latest Ref Rng & Units 03/01/2024    3:29 AM 02/29/2024    8:46 AM 02/28/2024    2:39 AM  CBC  WBC 4.0 - 10.5 K/uL 7.2  5.7  7.9   Hemoglobin 13.0 - 17.0 g/dL 8.6  9.0  9.0   Hematocrit 39.0 - 52.0 % 27.5  28.4  28.0   Platelets 150 - 400 K/uL 166  123  142        Latest Ref Rng & Units 03/01/2024    3:29 AM 02/29/2024    8:46 AM 02/28/2024    2:39 AM  CMP  Glucose 70 - 99 mg/dL 607  814  711   BUN 8 - 23 mg/dL 29  27  32   Creatinine 0.61 - 1.24 mg/dL 8.31  8.40  8.28   Sodium 135 - 145 mmol/L 136  133  133   Potassium 3.5 - 5.1 mmol/L 3.4  3.2  3.5   Chloride 98 - 111 mmol/L 102  101  98  CO2 22 - 32 mmol/L 25  25  25    Calcium  8.9 - 10.3 mg/dL 8.8  8.3  8.6     DG CHEST PORT 1 VIEW Result Date: 03/01/2024 CLINICAL DATA:  Shortness of breath EXAM: PORTABLE CHEST 1 VIEW COMPARISON:  02/24/2024 FINDINGS: Heart and mediastinal contours are within normal limits. No focal opacities or effusions. No acute bony abnormality. Aortic atherosclerosis. IMPRESSION: No active cardiopulmonary disease. Electronically Signed   By: Franky Crease M.D.   On: 03/01/2024 12:18     ASSESSMENT/PLAN:  Assessment: Principal Problem:   Cirrhosis of liver with ascites (HCC) Active Problems:   Myasthenia gravis (HCC)   DM (diabetes mellitus) (HCC)   Hypertension   Esophageal varices without bleeding (HCC)   GAVE (gastric antral vascular ectasia)   Portal hypertensive gastropathy (HCC)   Ascites   Hypervolemia   (HFpEF) heart failure with preserved ejection fraction (HCC)   Atrial fibrillation (HCC)   CKD (chronic kidney disease)  stage 3, GFR 30-59 ml/min (HCC)   Hepatic encephalopathy (HCC)  Bob Warren is a 77y.o. 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  Plan: #Altered Mental Status, Improving Patient became altered 8/2 afternoon with decreased attention, delayed responses, and asterixis on exam. High suspicion for hepatic encephalopathy. Asterixis present. Lactulose  started yesterday with a goal of 3 BM a day. Patient able to achieve 7 bowel movements yesterday. Stepdaughter was bedside last night to discuss her father's degree of illness and what options regarding their options. Night team ordered palliative consult. Pending their recommendations. - Palliative consult - Lactulose  10g TID after  #Decompensated cirrhosis suspect from [facility] #History of esophageal varices #HFpEF (60-65%) Continues to improve after receiving PO Lasix  again yesterday and starting spironolactone .  Blood pressures have also held without significant decrease.  Continues to have normal colored bowel movements.  Lower extremity edema is almost gone and his abdominal swelling is stable.  He has follow-up with GI on 8/28 and would likely get every 6 week paracenteses outpatient. Today on exam, he sounded fluid loaded in the bilateral lower lungs. Will get CXR to ensure no new pulmonary edema and add incentive spirometry for breathing optimization. - Continue PO Lasix  40 mg twice daily and spironolactone  to 50 mg daily -- CXR, incentive spirometry  #T2DM, controlled Last A1c 6.5% in June.  Blood sugars have been high here from 250-350. CBG today 392, but will monitor tomorrow for the need to increase dose. - Increasing Semglee  to 10 units daily,  -- Very Sensitive SSI with meals while  admitted  #HTN #HLD Continues to be normotensive with the addition of 50 mg spironolactone . - Lasix  and spironolactone  as above - Continue diltiazem  120 mg daily  #Hyponatremia, resolved  #CKD Stage 3 Creatinine stable, close to baseline 1.68 today  #Gastric Antral Vascular Ectasia (GAVE) #Iron Deficiency Anemia Hemoglobin decreased slightly to 8.6 from 9 and no signs of active bleeding.  #Myasthenia Gravis Stable without acute flares off medication.  #Chronic back pain #Chronic right hip pain Chronic. CT hip in 2024 showed severe right hip degenerative changes. PTA on tramadol  50mg  usually taken once to twice a day as needed.  -Voltaren  gel, heating pad - Added lidocaine  patch for further pain management  -Home Tramadol  50mg  BID PRN   Best Practice:  Diet: Cardiac diet IVF: Fluids: none VTE: enoxaparin  (LOVENOX ) injection 40 mg Start: 02/25/24 1100 Code: Full  Disposition planning: Therapy Recs: Pending,  Family Contact: stepdaughter DISPO: Anticipated discharge 0-1 days.  May discharge this afternoon if patient and family have a home return however may discharge tomorrow morning.  Signature:  Dwayna Kentner [facility]  1:31 PM, 03/01/2024  On Call pager [phone]

## 2024-03-01 NOTE — Progress Notes (Signed)
 Occupational Therapy Treatment Patient Details Name: Bob Warren MRN: [id number] DOB: 1945-08-18 Today's Date: 03/01/2024   History of present illness Pt is a 78 y/o male presenting with SOB, abdominal distension and BLE edema. Admitted for volume overload, decompensated cirrhosis and hyponatremia. Paracentesis 7/30 removing 9.4L. PMH: chronic HFpEF, myasthenia gravis, PAF, GAVE, chronic bilateral lower extremity edema, hypertension, hyperlipidemia, GERD, type 2 diabetes, cirrhosis d/t MASH   OT comments  Pt greeted supine in bed, pt agreeable to OT intervention. Pt limited by back pain and decreased activity tolerance this session. Pt currently requires MIN A for short distance functional ambulation in room with RW, MIN A for UB ADLS and MAX A for LB ADLS. Pt declined further exercise or ADL participation after completing household distance functional ambulation in room. Pt endorses that wife can assist as needed with ADLs/iADLS. Recommend pt f/u with HHOT. Will follow acutely for OT needs.       If plan is discharge home, recommend the following:  A little help with bathing/dressing/bathroom;Assistance with cooking/housework;Assist for transportation   Equipment Recommendations  None recommended by OT    Recommendations for Other Services      Precautions / Restrictions Precautions Precautions: Fall Restrictions Weight Bearing Restrictions Per Provider Order: No       Mobility Bed Mobility Overal bed mobility: Needs Assistance       Supine to sit: Min assist, HOB elevated, Used rails Sit to supine: Mod assist   General bed mobility comments: MIN A needed to elevate trunk into sitting, MODA needed to return to supine to elevate BLE back to bed    Transfers Overall transfer level: Needs assistance Equipment used: Rolling walker (2 wheels) Transfers: Sit to/from Stand Sit to Stand: Min assist           General transfer comment: MIN A to rise from EOB with assist  needed to power up, cues for hand placement     Balance Overall balance assessment: Needs assistance Sitting-balance support: No upper extremity supported, Feet supported Sitting balance-Leahy Scale: Fair     Standing balance support: Bilateral upper extremity supported, During functional activity Standing balance-Leahy Scale: Poor Standing balance comment: reliant on BUE support for mobility                           ADL either performed or assessed with clinical judgement   ADL Overall ADL's : Needs assistance/impaired                 Upper Body Dressing : Minimal assistance;Sitting Upper Body Dressing Details (indicate cue type and reason): to don gown as back side cover Lower Body Dressing: Maximal assistance;Sitting/lateral leans Lower Body Dressing Details (indicate cue type and reason): to don socks d/t back pain Toilet Transfer: Contact guard assist;Ambulation;Rolling walker (2 wheels) Toilet Transfer Details (indicate cue type and reason): simulated via functional mobility with RW         Functional mobility during ADLs: Contact guard assist;Rolling walker (2 wheels) General ADL Comments: ADL participation impacted by decreased activity tolerance and back pain impacting pts ability to complete LB ADLS    Extremity/Trunk Assessment Upper Extremity Assessment Upper Extremity Assessment: Generalized weakness   Lower Extremity Assessment Lower Extremity Assessment: Defer to PT evaluation   Cervical / Trunk Assessment Cervical / Trunk Assessment: Normal    Vision Baseline Vision/History: 1 Wears glasses Ability to See in Adequate Light: 0 Adequate Patient Visual Report: No change from baseline Vision Assessment?:  No apparent visual deficits   Perception Perception Perception: Not tested   Praxis Praxis Praxis: Not tested   Communication Communication Communication: No apparent difficulties   Cognition Arousal: Alert Behavior During  Therapy: WFL for tasks assessed/performed Cognition: No apparent impairments                               Following commands: Intact        Cueing   Cueing Techniques: Verbal cues  Exercises      Shoulder Instructions       General Comments pt reports wife can assist as needed with ADLs    Pertinent Vitals/ Pain       Pain Assessment Pain Assessment: Faces Faces Pain Scale: Hurts little more Pain Location: back Pain Descriptors / Indicators: Sore Pain Intervention(s): Limited activity within patient's tolerance, Monitored during session, Repositioned  Home Living                                          Prior Functioning/Environment              Frequency  Min 2X/week        Progress Toward Goals  OT Goals(current goals can now be found in the care plan section)  Progress towards OT goals: Progressing toward goals  Acute Rehab OT Goals Patient Stated Goal: none stated Time For Goal Achievement: 03/11/24 Potential to Achieve Goals: Good  Plan      Co-evaluation                 AM-PAC OT 6 Clicks Daily Activity     Outcome Measure   Help from another person eating meals?: None Help from another person taking care of personal grooming?: A Little Help from another person toileting, which includes using toliet, bedpan, or urinal?: A Little Help from another person bathing (including washing, rinsing, drying)?: A Little Help from another person to put on and taking off regular upper body clothing?: A Little Help from another person to put on and taking off regular lower body clothing?: A Lot 6 Click Score: 18    End of Session Equipment Utilized During Treatment: Gait belt;Rolling walker (2 wheels)  OT Visit Diagnosis: Unsteadiness on feet (R26.81);Other abnormalities of gait and mobility (R26.89);Muscle weakness (generalized) (M62.81)   Activity Tolerance Patient tolerated treatment well   Patient Left in  bed;with call bell/phone within reach;with bed alarm set   Nurse Communication Mobility status;Other (comment) (communication with NT about fluid intake)        Time: 1250-1307 OT Time Calculation (min): 17 min  Charges: OT General Charges $OT Visit: 1 Visit OT Treatments $Self Care/Home Management : 8-22 mins  Ronal Mallie POUR., COTA/L Acute Rehabilitation Services [phone]   Ronal Mallie Needy 03/01/2024, 1:23 PM

## 2024-03-01 NOTE — Progress Notes (Signed)
 Physical Therapy Treatment Patient Details Name: Bob Warren MRN: [id number] DOB: 06/19/1946 Today's Date: 03/01/2024   History of Present Illness Pt is a 78 y/o male presenting with SOB, abdominal distension and BLE edema. Admitted for volume overload, decompensated cirrhosis and hyponatremia. Paracentesis 7/30 removing 9.4L. PMH: chronic HFpEF, myasthenia gravis, PAF, GAVE, chronic bilateral lower extremity edema, hypertension, hyperlipidemia, GERD, type 2 diabetes, cirrhosis d/t MASH    PT Comments  Pt reluctant to participate partly due to back pain but also due to increased fatigue it causes. Pt is limited by pain and increased truncal burden of ascites, in presence of decreased strength and endurance. Pt is min-modA for bed mobility, min A for transfers and contact guard assist for ambulation. D/c plans remain appropriate at this time. PT will continue to follow acutely.    If plan is discharge home, recommend the following: A little help with walking and/or transfers;A little help with bathing/dressing/bathroom;Assistance with cooking/housework;Assist for transportation;Help with stairs or ramp for entrance     Equipment Recommendations  None recommended by PT       Precautions / Restrictions Precautions Precautions: Fall Precaution/Restrictions Comments: fall over a year ago Restrictions Weight Bearing Restrictions Per Provider Order: No     Mobility  Bed Mobility Overal bed mobility: Needs Assistance Bed Mobility: Sidelying to Sit, Sit to Sidelying, Rolling Rolling: Min assist, Mod assist Sidelying to sit: Mod assist     Sit to sidelying: Mod assist General bed mobility comments: modA for rolling onto L side and to push up to seated, modA for returning LE to bed and min A for rolling back on his back    Transfers Overall transfer level: Needs assistance Equipment used: Rolling walker (2 wheels) Transfers: Sit to/from Stand Sit to Stand: Min assist            General transfer comment: increased time and effort and min A for power up to RW,    Ambulation/Gait Ambulation/Gait assistance: Contact guard assist, Supervision Gait Distance (Feet): 12 Feet Assistive device: Rolling walker (2 wheels) Gait Pattern/deviations: Step-through pattern, Trunk flexed, Decreased step length - right, Decreased step length - left       General Gait Details: CGA for safety, increased cuing for upright posture which causes pain in his mid region. pt able to ambulate to sink and back, increased WoB by the time pt is back at the bed          Balance Overall balance assessment: Needs assistance Sitting-balance support: No upper extremity supported, Feet supported Sitting balance-Leahy Scale: Fair     Standing balance support: Bilateral upper extremity supported, During functional activity, Single extremity supported Standing balance-Leahy Scale: Poor Standing balance comment: reliant on BUE for mobility and one UE for static standing at sink during ADLs                            Communication Communication Communication: No apparent difficulties  Cognition Arousal: Alert Behavior During Therapy: WFL for tasks assessed/performed                             Following commands: Intact      Cueing Cueing Techniques: Verbal cues, Gestural cues     General Comments General comments (skin integrity, edema, etc.): Pt's wife is present and provides moral support. VSS on RA      Pertinent Vitals/Pain Pain Assessment Pain Assessment: Faces Faces Pain  Scale: Hurts even more Pain Location: back Pain Descriptors / Indicators: Pressure Pain Intervention(s): Limited activity within patient's tolerance, Monitored during session, Repositioned     PT Goals (current goals can now be found in the care plan section) Acute Rehab PT Goals PT Goal Formulation: With patient Time For Goal Achievement: 03/11/24 Potential to Achieve Goals:  Fair Progress towards PT goals: Not progressing toward goals - comment    Frequency    Min 2X/week       AM-PAC PT 6 Clicks Mobility   Outcome Measure  Help needed turning from your back to your side while in a flat bed without using bedrails?: None Help needed moving from lying on your back to sitting on the side of a flat bed without using bedrails?: A Little Help needed moving to and from a bed to a chair (including a wheelchair)?: A Little Help needed standing up from a chair using your arms (e.g., wheelchair or bedside chair)?: A Little Help needed to walk in hospital room?: A Little Help needed climbing 3-5 steps with a railing? : A Lot 6 Click Score: 18    End of Session Equipment Utilized During Treatment: Gait belt Activity Tolerance: Patient tolerated treatment well Patient left: with call bell/phone within reach;in bed;with bed alarm set;with family/visitor present Nurse Communication: Mobility status PT Visit Diagnosis: Unsteadiness on feet (R26.81);Other abnormalities of gait and mobility (R26.89);Muscle weakness (generalized) (M62.81)     Time: 8346-8286 PT Time Calculation (min) (ACUTE ONLY): 20 min  Charges:    $Therapeutic Activity: 8-22 mins PT General Charges $$ ACUTE PT VISIT: 1 Visit                     Luree Palla B. Fleeta Lapidus PT, DPT Acute Rehabilitation Services Please use secure chat or  Call Office [phone]    Almarie KATHEE Fleeta [facility] 03/01/2024, 5:32 PM

## 2024-03-02 DIAGNOSIS — K7460 Unspecified cirrhosis of liver: Secondary | ICD-10-CM

## 2024-03-02 DIAGNOSIS — Z515 Encounter for palliative care: Secondary | ICD-10-CM

## 2024-03-02 DIAGNOSIS — R188 Other ascites: Secondary | ICD-10-CM | POA: Diagnosis not present

## 2024-03-02 DIAGNOSIS — K7682 Hepatic encephalopathy: Secondary | ICD-10-CM

## 2024-03-02 DIAGNOSIS — Z7189 Other specified counseling: Secondary | ICD-10-CM | POA: Diagnosis not present

## 2024-03-02 DIAGNOSIS — I509 Heart failure, unspecified: Secondary | ICD-10-CM

## 2024-03-02 LAB — BASIC METABOLIC PANEL WITH GFR
Anion gap: 9 (ref 5–15)
BUN: 29 mg/dL — ABNORMAL HIGH (ref 8–23)
CO2: 26 mmol/L (ref 22–32)
Calcium: 8.5 mg/dL — ABNORMAL LOW (ref 8.9–10.3)
Chloride: 99 mmol/L (ref 98–111)
Creatinine, Ser: 1.58 mg/dL — ABNORMAL HIGH (ref 0.61–1.24)
GFR, Estimated: 45 mL/min — ABNORMAL LOW (ref >60)
Glucose, Bld: 285 mg/dL — ABNORMAL HIGH (ref 70–99)
Potassium: 3.3 mmol/L — ABNORMAL LOW (ref 3.5–5.1)
Sodium: 134 mmol/L — ABNORMAL LOW (ref 135–145)

## 2024-03-02 LAB — CBC WITH DIFFERENTIAL/PLATELET
Abs Immature Granulocytes: 0.05 K/uL (ref 0.00–0.07)
Basophils Absolute: 0.1 K/uL (ref 0.0–0.1)
Basophils Relative: 1 %
Eosinophils Absolute: 0.2 K/uL (ref 0.0–0.5)
Eosinophils Relative: 3 %
HCT: 28.4 % — ABNORMAL LOW (ref 39.0–52.0)
Hemoglobin: 8.8 g/dL — ABNORMAL LOW (ref 13.0–17.0)
Immature Granulocytes: 1 %
Lymphocytes Relative: 13 %
Lymphs Abs: 0.8 K/uL (ref 0.7–4.0)
MCH: 25.3 pg — ABNORMAL LOW (ref 26.0–34.0)
MCHC: 31 g/dL (ref 30.0–36.0)
MCV: 81.6 fL (ref 80.0–100.0)
Monocytes Absolute: 0.6 K/uL (ref 0.1–1.0)
Monocytes Relative: 9 %
Neutro Abs: 4.8 K/uL (ref 1.7–7.7)
Neutrophils Relative %: 73 %
Platelets: 148 K/uL — ABNORMAL LOW (ref 150–400)
RBC: 3.48 MIL/uL — ABNORMAL LOW (ref 4.22–5.81)
RDW: 16.4 % — ABNORMAL HIGH (ref 11.5–15.5)
WBC: 6.5 K/uL (ref 4.0–10.5)
nRBC: 0 % (ref 0.0–0.2)

## 2024-03-02 LAB — GLUCOSE, CAPILLARY
Glucose-Capillary: 251 mg/dL — ABNORMAL HIGH (ref 70–99)
Glucose-Capillary: 222 mg/dL — ABNORMAL HIGH (ref 70–99)
Glucose-Capillary: 190 mg/dL — ABNORMAL HIGH (ref 70–99)
Glucose-Capillary: 331 mg/dL — ABNORMAL HIGH (ref 70–99)

## 2024-03-02 MED ORDER — LACTULOSE 10 GM/15ML PO SOLN
20.0000 g | Freq: Three times a day (TID) | ORAL | Status: DC
  Administered 2024-03-02 (×2): 20 g via ORAL
  Filled 2024-03-02 (×2): qty 30

## 2024-03-02 MED ORDER — INSULIN ASPART 100 UNIT/ML IJ SOLN
8.0000 [IU] | Freq: Once | INTRAMUSCULAR | Status: AC
  Administered 2024-03-02: 8 [IU] via SUBCUTANEOUS

## 2024-03-02 MED ORDER — POTASSIUM CHLORIDE 20 MEQ PO PACK
40.0000 meq | PACK | Freq: Two times a day (BID) | ORAL | Status: DC
  Administered 2024-03-02 – 2024-03-08 (×13): 40 meq via ORAL
  Filled 2024-03-02 (×13): qty 2

## 2024-03-02 MED ORDER — INSULIN GLARGINE-YFGN 100 UNIT/ML ~~LOC~~ SOLN
5.0000 [IU] | Freq: Once | SUBCUTANEOUS | Status: AC
  Administered 2024-03-02: 5 [IU] via SUBCUTANEOUS
  Filled 2024-03-02: qty 0.05

## 2024-03-02 MED ORDER — INSULIN GLARGINE-YFGN 100 UNIT/ML ~~LOC~~ SOLN
15.0000 [IU] | Freq: Every day | SUBCUTANEOUS | Status: DC
  Administered 2024-03-03 – 2024-03-08 (×7): 15 [IU] via SUBCUTANEOUS
  Filled 2024-03-02 (×6): qty 0.15

## 2024-03-02 NOTE — Plan of Care (Signed)

## 2024-03-02 NOTE — Inpatient Diabetes Management (Signed)
 Inpatient Diabetes Program Recommendations  AACE/ADA: New Consensus Statement on Inpatient Glycemic Control (2015)  Target Ranges:  Prepandial:   less than 140 mg/dL      Peak postprandial:   less than 180 mg/dL (1-2 hours)      Critically ill patients:  140 - 180 mg/dL    Latest Reference Range & Units 03/01/24 07:40 03/01/24 12:17 03/01/24 16:51 03/01/24 20:28  Glucose-Capillary 70 - 99 mg/dL 729 (H)  3 units Novolog   10 units Semglee  @0905   208 (H)  2 units Novolog   311 (H)  4 units Novolog   427 (H)  (H): Data is abnormally high  Latest Reference Range & Units 03/02/24 07:30  Glucose-Capillary 70 - 99 mg/dL 748 (H)  3 units Novolog    (H): Data is abnormally high    Home DM Meds: Novolog  4-6 units TID with meals       Lantus  30-40 units at HS       Metformin 850 mg BID  Current Orders: Semglee  10 units daily     Novolog  0-6 units TID    MD- Note CBGs >200  Please consider:  1. Increase Semglee  to 15 units daily  2. Increase Novolog  SSI to the 0-9 unit Sensitive scale    --Will follow patient during hospitalization--  Adina Rudolpho Arrow RN, MSN, CDCES Diabetes Coordinator Inpatient Glycemic Control Team Team Pager: [phone] (8a-5p)

## 2024-03-02 NOTE — Progress Notes (Signed)
 HD#5 SUBJECTIVE:  Patient Summary: Bob Warren is a 78 y.o. with a pertinent PMH of GAVE, myasthenia gravis, HFpEF (60-65%), HTN, HLD, T2DM, GERD, who presented with abdominal distention, bilateral leg swelling, and shortness of breath and admitted for volume overload due to decompensated cirrhosis.  Overnight Events: Daughter was present at bedside yesterday, voicing concerns about her father's hospitalization. Palliative care consulted per night team.  Interim History: Patient is doing well this morning without any new or worsening complaints. Today he reports feeling okay overall. He denies pain, headaches, shortness of breath, and overall feels similar to his baseline. He denies any leg pain or belly pain. He reports 3 BM yesterday on 8/4. He also states he did not get his incentive spirometry yesterday.  OBJECTIVE:  Vital Signs: Vitals:   03/02/24 0024 03/02/24 0413 03/02/24 0500 03/02/24 0717  BP: 131/71 138/77  (!) 141/75  Pulse: 87 85  88  Resp: 18 18  17   Temp: 97.9 F (36.6 C) 98 F (36.7 C)  97.8 F (36.6 C)  TempSrc: Oral Oral    SpO2: 98% 99%  99%  Weight:   84.3 kg   Height:       Supplemental O2: Room Air SpO2: 99 %  Filed Weights   02/29/24 0404 03/01/24 0500 03/02/24 0500  Weight: 85.4 kg 86.8 kg 84.3 kg     Intake/Output Summary (Last 24 hours) at 03/02/2024 1446 Last data filed at 03/02/2024 0025 Gross per 24 hour  Intake --  Output 550 ml  Net -550 ml   Net IO Since Admission: -4,059 mL [03/02/24 1446]  Physical Exam: Constitutional: Chronically ill appearing male laying in bed. In no acute distress. Cardio:Regular rate and rhythm.  Pulm: Increased work of breathing on room air. Bilateral lungs sound fluid-filled.  Abdomen: Soft, nontender, distended but not tense, positive bowel sounds MSK: Trace lower extremity edema bilaterally Skin:Warm and dry. Neuro:Alert and oriented x4. No focal deficit noted.  Psych: Pleasant but tired mood and  affect.  Patient Lines/Drains/Airways Status     Active Line/Drains/Airways     Name Placement date Placement time Site Days   Peripheral IV 02/25/24 20 G Left Antecubital 02/25/24  1700  Antecubital  1            Pertinent labs and imaging:      Latest Ref Rng & Units 03/02/2024    3:28 AM 03/01/2024    3:29 AM 02/29/2024    8:46 AM  CBC  WBC 4.0 - 10.5 K/uL 6.5  7.2  5.7   Hemoglobin 13.0 - 17.0 g/dL 8.8  8.6  9.0   Hematocrit 39.0 - 52.0 % 28.4  27.5  28.4   Platelets 150 - 400 K/uL 148  166  123        Latest Ref Rng & Units 03/02/2024    3:28 AM 03/01/2024    3:29 AM 02/29/2024    8:46 AM  CMP  Glucose 70 - 99 mg/dL 714  607  814   BUN 8 - 23 mg/dL 29  29  27    Creatinine 0.61 - 1.24 mg/dL 8.41  8.31  8.40   Sodium 135 - 145 mmol/L 134  136  133   Potassium 3.5 - 5.1 mmol/L 3.3  3.4  3.2   Chloride 98 - 111 mmol/L 99  102  101   CO2 22 - 32 mmol/L 26  25  25    Calcium  8.9 - 10.3 mg/dL 8.5  8.8  8.3  No results found.    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  Plan: #Hypokalemia Patient K today 3.3. Will replete  with 40mEq today. Will continue to monitor with daily BMPs - Trend BMP  #Altered Mental Status, Improving Patient became altered 8/2 afternoon with decreased attention, delayed responses, and asterixis on exam. High suspicion for hepatic encephalopathy. Asterixis present. Lactulose  started 8/3 with a goal of 3 BM a day. Patient able to achieve at least 3 bowel movements yesterday. Palliative care saw patient and stepdaughter and will have a further discussion today about advanced directives and the patient's GOC and treatment preferences. Pt has home PT and nursing already. As of current, patient wishes to remain full code. - Palliative discussion today - Lactulose  10g TID  #Decompensated cirrhosis suspect from [facility] #History of esophageal varices #HFpEF (60-65%) Continues to improve after receiving PO Lasix  again yesterday and starting spironolactone .  Blood pressures have also held without significant decrease.  Continues to have normal colored bowel movements.  Lower extremity edema is almost gone and his abdominal swelling is stable.  He has follow-up with GI on 8/28 and would likely get every 6 week paracenteses outpatient. CXR yesterday showed no new pulmonary edema. Incentive spirometry for breathing optimization. - Continue PO Lasix  40 mg twice daily and spironolactone  to 50 mg daily -- Continue incentive spirometry  #T2DM, controlled Last A1c 6.5% in June.  Blood sugars have been high here from 250-350. CBG today 285. Will increase Semglee  to 15units - Semglee  15 units daily -- Very Sensitive SSI with meals while admitted  #HTN #HLD Continues to be normotensive with the addition of 50 mg spironolactone . - Lasix  and spironolactone  as above - Continue diltiazem  120 mg daily  #Hyponatremia, resolved  #CKD Stage 3 Creatinine stable, close to baseline 1.58 today  #Gastric Antral Vascular Ectasia (GAVE) #Iron Deficiency Anemia Hemoglobin 8.8 today with no signs of active  bleeding.  #Myasthenia Gravis Stable without acute flares off medication.  #Chronic back pain #Chronic right hip pain Chronic. CT hip in 2024 showed severe right hip degenerative changes. PTA on tramadol  50mg  usually taken once to twice a day as needed.  -Voltaren  gel, heating pad - Lidocaine  patch for further pain management  -Home Tramadol  50mg  BID PRN   Best Practice: Diet: Cardiac diet IVF: Fluids: none VTE: enoxaparin  (LOVENOX ) injection 40 mg Start: 02/25/24 1100 Code: Full  Disposition planning: Therapy Recs: Pending,  Family Contact: stepdaughter DISPO: Anticipated discharge 0-1 days.  May discharge this afternoon if patient and family have a home return however may discharge tomorrow morning.  Signature:  Hamad Whyte [facility]  2:46 PM, 03/02/2024  On Call pager [phone]

## 2024-03-02 NOTE — Progress Notes (Signed)
 Mobility Specialist: Progress Note   03/02/24 1547  Mobility  Activity Dangled on edge of bed  Level of Assistance Moderate assist, patient does 50-74%  Activity Response Tolerated well  Mobility Referral Yes  Mobility visit 1 Mobility  Mobility Specialist Start Time (ACUTE ONLY) 0912  Mobility Specialist Stop Time (ACUTE ONLY) 0929  Mobility Specialist Time Calculation (min) (ACUTE ONLY) 17 min    Pt received in bed, agreeable to mobility session. Initially agreeable to ambulation but pt very distracted and requiring max verbal cues to stay on task this session. ModA physical assist along with extra time and maxA cues to initiate movement to EOB. Once EOB, pt needing to be redirected 3x to stand up. Pt would agree, but would start to lay back down. Further mobility eventually deferred. Returned pt to supine. Left in bed with all needs met, call bell in reach.   Ileana Lute Mobility Specialist Please contact via SecureChat or Rehab office at [phone]

## 2024-03-02 NOTE — Progress Notes (Signed)
 Palliative:  HPI: 78 y.o. male  with past medical history of GAVE, myasthenia gravis, HFpEF (60-65%), HTN, HLD, T2DM, and GERD,  admitted on 02/24/2024 with shortness of breath and admitted for volume overload due to decompensated cirrhosis.   I met today with Bob Warren. He is alert and interactive. He is oriented to person, place, and year. He is struggling to focus and poor insight into condition. Poor concentration limiting ability to have good conversation with him today. I called and discussed in detail with daughter, Bob Warren. Bob Warren and I both agree to postpone conversation with Bob Warren today but to meet later today and discuss with his wife.   Update: Bob Warren and I met with wife, Bob Warren, to discuss decompensated liver cirrhosis and overall poor prognosis. We gently explained his poor progression and high risk for further complications and decline. Bob Warren receives this information but is understandably tearful sharing that she was hoping he could get better. I did explain that we can do all we can to ensure that whatever time he does have left is as good and meaningful as possible. I reassured her that we can help them decide how to best care for Bob Warren from here and try to plan ahead for potential decline. Bob Warren offers support to her mother throughout this difficult conversation. They do not want him to suffer. Bob Warren needs some time to process this information. We are hopeful that we can have a meaningful GOC conversation with Bob Warren to further clarify his wishes if he is able to have more insight into the reality of his situation. We will speak again tomorrow.   All questions/concerns addressed. Emotional support provided.   Exam: Alert, oriented to person, place, year. Unable to focus to have meaningful conversation. Fixated at times on phone and other items. Trying to utilize knife to eat - assisted with meal set up. No distress. Abd distended but soft. BLE edema.   Plan: - Wife processing  overall poor prognosis. Ongoing conversation regarding goals of care and decisions to be made. F//U tomorrow.   90 min  Bernarda Kitty, NP Palliative Medicine Team Pager [phone] (Please see [email or web address] for schedule) Team Phone [phone]

## 2024-03-03 DIAGNOSIS — R188 Other ascites: Secondary | ICD-10-CM | POA: Diagnosis not present

## 2024-03-03 DIAGNOSIS — Z7189 Other specified counseling: Secondary | ICD-10-CM | POA: Diagnosis not present

## 2024-03-03 DIAGNOSIS — Z515 Encounter for palliative care: Secondary | ICD-10-CM | POA: Diagnosis not present

## 2024-03-03 DIAGNOSIS — K7460 Unspecified cirrhosis of liver: Secondary | ICD-10-CM | POA: Diagnosis not present

## 2024-03-03 LAB — BASIC METABOLIC PANEL WITH GFR
Anion gap: 9 (ref 5–15)
BUN: 25 mg/dL — ABNORMAL HIGH (ref 8–23)
CO2: 26 mmol/L (ref 22–32)
Calcium: 8.6 mg/dL — ABNORMAL LOW (ref 8.9–10.3)
Chloride: 100 mmol/L (ref 98–111)
Creatinine, Ser: 1.49 mg/dL — ABNORMAL HIGH (ref 0.61–1.24)
GFR, Estimated: 48 mL/min — ABNORMAL LOW (ref >60)
Glucose, Bld: 141 mg/dL — ABNORMAL HIGH (ref 70–99)
Potassium: 3.9 mmol/L (ref 3.5–5.1)
Sodium: 135 mmol/L (ref 135–145)

## 2024-03-03 LAB — CBC
HCT: 27.9 % — ABNORMAL LOW (ref 39.0–52.0)
Hemoglobin: 8.8 g/dL — ABNORMAL LOW (ref 13.0–17.0)
MCH: 25.9 pg — ABNORMAL LOW (ref 26.0–34.0)
MCHC: 31.5 g/dL (ref 30.0–36.0)
MCV: 82.1 fL (ref 80.0–100.0)
Platelets: 134 K/uL — ABNORMAL LOW (ref 150–400)
RBC: 3.4 MIL/uL — ABNORMAL LOW (ref 4.22–5.81)
RDW: 16.7 % — ABNORMAL HIGH (ref 11.5–15.5)
WBC: 7.4 K/uL (ref 4.0–10.5)
nRBC: 0 % (ref 0.0–0.2)

## 2024-03-03 LAB — GLUCOSE, CAPILLARY
Glucose-Capillary: 136 mg/dL — ABNORMAL HIGH (ref 70–99)
Glucose-Capillary: 199 mg/dL — ABNORMAL HIGH (ref 70–99)
Glucose-Capillary: 216 mg/dL — ABNORMAL HIGH (ref 70–99)
Glucose-Capillary: 211 mg/dL — ABNORMAL HIGH (ref 70–99)

## 2024-03-03 MED ORDER — LACTULOSE 10 GM/15ML PO SOLN
30.0000 g | Freq: Three times a day (TID) | ORAL | Status: DC
  Administered 2024-03-03 – 2024-03-06 (×10): 30 g via ORAL
  Filled 2024-03-03 (×9): qty 45

## 2024-03-03 MED ORDER — INSULIN ASPART 100 UNIT/ML IJ SOLN
4.0000 [IU] | Freq: Three times a day (TID) | INTRAMUSCULAR | Status: DC
  Administered 2024-03-03 – 2024-03-07 (×11): 4 [IU] via SUBCUTANEOUS

## 2024-03-03 NOTE — Progress Notes (Signed)
 Physical Therapy Treatment Patient Details Name: Bob Warren MRN: [id number] DOB: 15-May-1946 Today's Date: 03/03/2024   History of Present Illness Pt is a 78 y/o male presenting with SOB, abdominal distension and BLE edema. Admitted for volume overload, decompensated cirrhosis and hyponatremia. Paracentesis 7/30 removing 9.4L. PMH: chronic HFpEF, myasthenia gravis, PAF, GAVE, chronic bilateral lower extremity edema, hypertension, hyperlipidemia, GERD, type 2 diabetes, cirrhosis d/t MASH    PT Comments  Pt denies pain and is agreeable for PT treatment. Pt transitioning to edge of bed with min assist. Performed seated warm up exercise and ambulating ~25 ft with a walker and CGA. Will benefit from continued acute and follow up PT to address strengthening, endurance, and balance.   If plan is discharge home, recommend the following: A little help with walking and/or transfers;A little help with bathing/dressing/bathroom;Assistance with cooking/housework;Assist for transportation;Help with stairs or ramp for entrance   Can travel by private vehicle        Equipment Recommendations  None recommended by PT    Recommendations for Other Services       Precautions / Restrictions Precautions Precautions: Fall Restrictions Weight Bearing Restrictions Per Provider Order: No     Mobility  Bed Mobility Overal bed mobility: Needs Assistance Bed Mobility: Supine to Sit, Sit to Supine     Supine to sit: Min assist Sit to supine: Mod assist   General bed mobility comments: Light minA to fully elevate trunk to sitting position. ModA for BLE elevation back into bed    Transfers Overall transfer level: Needs assistance Equipment used: Rolling walker (2 wheels) Transfers: Sit to/from Stand Sit to Stand: Contact guard assist           General transfer comment: Bed slightly elevated, no physical asisst to power up    Ambulation/Gait Ambulation/Gait assistance: Contact guard assist Gait  Distance (Feet): 25 Feet Assistive device: Rolling walker (2 wheels) Gait Pattern/deviations: Step-through pattern, Trunk flexed, Decreased step length - right, Decreased step length - left Gait velocity: decreased Gait velocity interpretation: <1.8 ft/sec, indicate of risk for recurrent falls   General Gait Details: Slow and steady pace, ambulated to door and back   Stairs             Wheelchair Mobility     Tilt Bed    Modified Rankin (Stroke Patients Only)       Balance Overall balance assessment: Needs assistance Sitting-balance support: No upper extremity supported, Feet supported Sitting balance-Leahy Scale: Fair     Standing balance support: Bilateral upper extremity supported, During functional activity, Single extremity supported Standing balance-Leahy Scale: Poor Standing balance comment: reliant on at least one extremity support for standing balance                            Communication Communication Communication: No apparent difficulties  Cognition Arousal: Alert Behavior During Therapy: WFL for tasks assessed/performed   PT - Cognitive impairments: No apparent impairments                         Following commands: Intact      Cueing Cueing Techniques: Verbal cues, Gestural cues  Exercises General Exercises - Upper Extremity Shoulder Flexion: AROM, Both, 5 reps, Seated (to 90) General Exercises - Lower Extremity Long Arc Quad: AROM, Both, 10 reps, Seated Hip Flexion/Marching: AROM, Both, 10 reps, Seated    General Comments General comments (skin integrity, edema, etc.): patient declining staying  up in recliner at end of session due to patient states bed is more comfortable for his back      Pertinent Vitals/Pain Pain Assessment Pain Assessment: No/denies pain    Home Living                          Prior Function            PT Goals (current goals can now be found in the care plan section) Acute  Rehab PT Goals Potential to Achieve Goals: Fair Progress towards PT goals: Progressing toward goals    Frequency    Min 2X/week      PT Plan      Co-evaluation              AM-PAC PT 6 Clicks Mobility   Outcome Measure  Help needed turning from your back to your side while in a flat bed without using bedrails?: None Help needed moving from lying on your back to sitting on the side of a flat bed without using bedrails?: A Little Help needed moving to and from a bed to a chair (including a wheelchair)?: A Little Help needed standing up from a chair using your arms (e.g., wheelchair or bedside chair)?: A Little Help needed to walk in hospital room?: A Little Help needed climbing 3-5 steps with a railing? : A Lot 6 Click Score: 18    End of Session Equipment Utilized During Treatment: Gait belt Activity Tolerance: Patient tolerated treatment well Patient left: in bed;with call bell/phone within reach;with bed alarm set Nurse Communication: Mobility status PT Visit Diagnosis: Unsteadiness on feet (R26.81);Other abnormalities of gait and mobility (R26.89);Muscle weakness (generalized) (M62.81)     Time: 8961-8946 PT Time Calculation (min) (ACUTE ONLY): 15 min  Charges:    $Therapeutic Activity: 8-22 mins PT General Charges $$ ACUTE PT VISIT: 1 Visit                     Aleck Daring, PT, DPT Acute Rehabilitation Services Office [phone]    Bob Warren 03/03/2024, 12:17 PM

## 2024-03-03 NOTE — TOC Progression Note (Signed)
 Transition of Care [facility]) - Progression Note    Patient Details  Name: Bob Warren MRN: [id number] Date of Birth: October 17, 1945  Transition of Care [facility]) CM/SW Contact  Rosaline JONELLE Joe, RN Phone Number: 03/03/2024, 4:24 PM  Clinical Narrative:    Cm spoke with Bernarda Kitty, NP with Palliative Care Team and she states that she met with the patient and family regarding home hospice.  Patient's family would like time to discuss patient's needs.  I will follow up with Palliative Care in the am and family when appropriate to offer choice once decisions have been made regarding comfort care needs.  CM with IP Care management team will continue to follow the patient.                     Expected Discharge Plan and Services                                               Social Drivers of Health (SDOH) Interventions SDOH Screenings   Food Insecurity: No Food Insecurity (02/25/2024)  Housing: Low Risk  (02/25/2024)  Transportation Needs: No Transportation Needs (02/25/2024)  Recent Concern: Transportation Needs - Unmet Transportation Needs (12/24/2023)   Received from [facility]  Utilities: Not At Risk (02/25/2024)  Financial Resource Strain: Medium Risk (12/24/2023)   Received from [facility]  Physical Activity: Sufficiently Active (11/27/2022)   Received from [facility]  Social Connections: Moderately Integrated (02/25/2024)  Stress: No Stress Concern Present (11/27/2022)   Received from [facility]  Tobacco Use: Medium Risk (02/24/2024)  Health Literacy: Low Risk  (11/27/2022)   Received from [facility]    Readmission Risk Interventions     No data to display

## 2024-03-03 NOTE — Progress Notes (Signed)
 HD#6 SUBJECTIVE:  Patient Summary: Bob Warren is a 78 y.o. with a pertinent PMH of GAVE, myasthenia gravis, HFpEF (60-65%), HTN, HLD, T2DM, GERD, who presented with abdominal distention, bilateral leg swelling, and shortness of breath and admitted for volume overload due to decompensated cirrhosis.  Overnight Events: Patient required 8 units of Novolog  overnight. Had a fasting glucose of 331.  Interim History: Patient is doing well this morning without any new or worsening complaints. Today he reports feeling okay overall. He denies pain, headaches, shortness of breath, and overall feels similar to his baseline. He denies any leg pain or belly pain. He reports 3 BM yesterday, but this is unclear.  OBJECTIVE:  Vital Signs: Vitals:   03/03/24 0346 03/03/24 0500 03/03/24 0733 03/03/24 1140  BP: 135/75  127/70 128/75  Pulse: 83  76 80  Resp: 17     Temp: 97.8 F (36.6 C)  97.8 F (36.6 C) 97.6 F (36.4 C)  TempSrc: Oral     SpO2: 98%  97% 98%  Weight:  84.4 kg    Height:       Supplemental O2: Room Air SpO2: 98 %  Filed Weights   03/01/24 0500 03/02/24 0500 03/03/24 0500  Weight: 86.8 kg 84.3 kg 84.4 kg     Intake/Output Summary (Last 24 hours) at 03/03/2024 1334 Last data filed at 03/03/2024 0030 Gross per 24 hour  Intake 340 ml  Output 700 ml  Net -360 ml   Net IO Since Admission: -4,419 mL [03/03/24 1334]  Physical Exam: Constitutional: Chronically ill appearing male laying in bed. In no acute distress. Cardio:Regular rate and rhythm.  Pulm: Increased work of breathing on room air. Bilateral lungs sound fluid-filled.  Abdomen: Soft, nontender, distended but not tense, positive bowel sounds MSK: Trace lower extremity edema bilaterally Skin:Warm and dry. Neuro:Alert and oriented x4. No focal deficit noted.  Psych: Pleasant but tired mood and affect.  Patient Lines/Drains/Airways Status     Active Line/Drains/Airways     Name Placement date Placement time Site  Days   Peripheral IV 02/25/24 20 G Left Antecubital 02/25/24  1700  Antecubital  1            Pertinent labs and imaging:      Latest Ref Rng & Units 03/03/2024    9:02 AM 03/02/2024    3:28 AM 03/01/2024    3:29 AM  CBC  WBC 4.0 - 10.5 K/uL 7.4  6.5  7.2   Hemoglobin 13.0 - 17.0 g/dL 8.8  8.8  8.6   Hematocrit 39.0 - 52.0 % 27.9  28.4  27.5   Platelets 150 - 400 K/uL 134  148  166        Latest Ref Rng & Units 03/03/2024    9:02 AM 03/02/2024    3:28 AM 03/01/2024    3:29 AM  CMP  Glucose 70 - 99 mg/dL 858  714  607   BUN 8 - 23 mg/dL 25  29  29    Creatinine 0.61 - 1.24 mg/dL 8.50  8.41  8.31   Sodium 135 - 145 mmol/L 135  134  136   Potassium 3.5 - 5.1 mmol/L 3.9  3.3  3.4   Chloride 98 - 111 mmol/L 100  99  102   CO2 22 - 32 mmol/L 26  26  25    Calcium  8.9 - 10.3 mg/dL 8.6  8.5  8.8     No results found.    ASSESSMENT/PLAN:  Assessment: Principal  Problem:   Cirrhosis of liver with ascites (HCC) Active Problems:   Myasthenia gravis (HCC)   DM (diabetes mellitus) (HCC)   Hypertension   Esophageal varices without bleeding (HCC)   GAVE (gastric antral vascular ectasia)   Portal hypertensive gastropathy (HCC)   Ascites   Hypervolemia   (HFpEF) heart failure with preserved ejection fraction (HCC)   Atrial fibrillation (HCC)   CKD (chronic kidney disease) stage 3, GFR 30-59 ml/min (HCC)   Hepatic encephalopathy (HCC)   Acute on chronic congestive heart failure (HCC)  Bob Warren is a 77y.o. 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  Plan: #Hypokalemia, resolved Patient K today 135. Will continue to monitor with daily BMPs - Trend BMP  #Altered Mental Status, Improving Patient became altered 8/2 afternoon  with decreased attention, delayed responses, and asterixis on exam. High suspicion for hepatic encephalopathy. Asterixis negative. Lactulose  started 8/3 with a goal of 3 BM a day. Patient only had 2 reported BM yesterday. Will increase lactulose  to 30g TID until BM goals achieved. Palliative care saw patient, wife, and stepdaughter and will have a further discussion today about advanced directives and the patient's GOC and treatment preferences. Patient was unable to sustain attention to have a meaningful conversation last night.  - Palliative discussion today - Lactulose  20g TID  #Decompensated cirrhosis suspect from [facility] #History of esophageal varices #HFpEF (60-65%) Continues to improve after receiving PO Lasix  again yesterday and starting spironolactone .  Blood pressures have also held without significant decrease.  Continues to have normal colored bowel movements.  Lower extremity edema is almost gone and his abdominal swelling is stable.  He has follow-up with GI on 8/28 and would likely get every 6 week paracenteses outpatient.Ultrasound yesterday found minimal fluid in the right lower lung. Patient had no complaints and felt he could breathe normally on room air.  - Continue PO Lasix  40 mg twice daily and spironolactone  to 50 mg daily   #T2DM, controlled Last A1c 6.5% in June.  Blood sugars have been high here from 250-350. Fasting glucose last night 331. Will add 4 units of mealtime insulin . - Semglee  20 units daily -- Very Sensitive SSI with meals  #HTN #HLD Continues to be normotensive with the addition of 50 mg spironolactone . - Lasix  and spironolactone  as above - Continue diltiazem  120 mg daily  #Hyponatremia, resolved  #CKD Stage 3 Creatinine stable, 1.49 today  #Gastric Antral Vascular Ectasia (GAVE) #Iron Deficiency Anemia Hemoglobin 8.8  today with no signs of active bleeding.  #Myasthenia Gravis Stable without acute flares off medication.  #Chronic back  pain #Chronic right hip pain Chronic. CT hip in 2024 showed severe right hip degenerative changes. PTA on tramadol  50mg  usually taken once to twice a day as needed.  -Voltaren  gel, heating pad - Lidocaine  patch for further pain management  -Home Tramadol  50mg  BID PRN   Best Practice: Diet: Cardiac diet IVF: Fluids: none VTE: enoxaparin  (LOVENOX ) injection 40 mg Start: 02/25/24 1100 Code: Full  Disposition planning: Therapy Recs: Pending,  Family Contact: stepdaughter DISPO: Anticipated discharge 0-1 days.  May discharge this afternoon if patient and family have a home return however may discharge tomorrow morning.  Signature:  Merilee Wible [facility]  1:34 PM, 03/03/2024  On Call pager [phone]

## 2024-03-03 NOTE — Progress Notes (Signed)
 Palliative:  HPI: 78 y.o. male  with past medical history of GAVE, myasthenia gravis, HFpEF (60-65%), HTN, HLD, T2DM, and GERD,  admitted on 02/24/2024 with shortness of breath and admitted for volume overload due to decompensated cirrhosis.   I met today with Bob Warren along with Dr. Isobel. We reviewed plans and potential discharge options. I shared that I do not feel he would qualify for hospice facility at this stage. I discussed potential of hospice help at home in the interim. We discussed what hospice at home would look like. I know that this would still be a lot on family too though.   Update: I returned to the bedside. I met with Bob Warren, wife Bob Warren, daughter Bob Warren, and 2 granddaughters. I spent time discussing and explaining to Bob Warren his liver cirrhosis and what this means. I gently explained that this is a condition that worsens over time and can have severe complications. I explained our goal to take good care of him but also to prepare for what is to come. I spent time explaining to Bob Warren the importance of knowing his wishes and what he would want. I discussed with him his desire for resuscitation. He initially shares he would want attempt at resuscitation but after explaining this further and poor outcome he confirms that he would want a peaceful death surrounded by his loved ones if it is his time to go. We will place DNR/DNI.   I spent time discussing with Bob Warren his wishes for the time he has left. He wants to have good time with his wife and family. He does not want to suffer. He wants to be at home. I discussed with them the 2 paths of home health vs hospice support at home. I explained that I feel that he and his family would be better supported and cared for by the help of hospice. I acknowledged that this is a lot to take in. I reassured him that he does not need to make all the decisions today but that this is something he can continue to think and speak with his family  about. He would like to take a little time to consider. I spent time providing him reassurance that we will take good care of him. We reviewed his faith and that God will continue to care for him and his family through this journey. I reassured him that I feel that he still has some good time left that he can enjoy time with his loved ones. Bob Warren thanked me for my honesty in this conversation. I left him to spend some time with his family.   All questions/concerns addressed. Emotional support provided.   Exam: Alert, oriented today. No distress. Breathing regular, unlabored. Abd distended. Moves all extremities. Generalized weakness.   Plan: - DNR/DNI decided - Considering home with hospice - Needs paracentesis vs peritoneal drain prior to d/c  60 min  Bernarda Kitty, NP Palliative Medicine Team Pager [phone] (Please see [email or web address] for schedule) Team Phone [phone]

## 2024-03-03 NOTE — Plan of Care (Signed)

## 2024-03-03 NOTE — Progress Notes (Signed)
 Occupational Therapy Treatment Patient Details Name: Kaidin Boehle MRN: [id number] DOB: 1945-11-14 Today's Date: 03/03/2024   History of present illness Pt is a 78 y/o male presenting with SOB, abdominal distension and BLE edema. Admitted for volume overload, decompensated cirrhosis and hyponatremia. Paracentesis 7/30 removing 9.4L. PMH: chronic HFpEF, myasthenia gravis, PAF, GAVE, chronic bilateral lower extremity edema, hypertension, hyperlipidemia, GERD, type 2 diabetes, cirrhosis d/t MASH   OT comments  Patient making good gains with OT treatment with decreased back pain. Patient instructed patient on log rolling and mod assist to get to EOB. Patient able to ambulate from EOB to bathroom for toilet. Patient required increased time to use bathroom and was unable to have a BM. Patient performed self care seated/standing at sink with CGA while standing and min to mod assist for LB ADLs. Discharge recommendations continue to be appropriate.  Acute OT to continue to follow to address established goals to facilitate DC to next venue of care.        If plan is discharge home, recommend the following:  A little help with bathing/dressing/bathroom;Assistance with cooking/housework;Assist for transportation   Equipment Recommendations  None recommended by OT    Recommendations for Other Services      Precautions / Restrictions Precautions Precautions: Fall Precaution/Restrictions Comments: fall over a year ago Restrictions Weight Bearing Restrictions Per Provider Order: No       Mobility Bed Mobility Overal bed mobility: Needs Assistance Bed Mobility: Sidelying to Sit, Sit to Sidelying, Rolling Rolling: Min assist Sidelying to sit: Mod assist     Sit to sidelying: Mod assist General bed mobility comments: mod assist with trunk to get to EOB and assistance with BLEs to return to sidelying    Transfers Overall transfer level: Needs assistance Equipment used: Rolling walker (2  wheels) Transfers: Sit to/from Stand Sit to Stand: Min assist           General transfer comment: min assist to power up with cues for hand placement and min assist with verbal cues with transfers     Balance Overall balance assessment: Needs assistance Sitting-balance support: No upper extremity supported, Feet supported Sitting balance-Leahy Scale: Fair     Standing balance support: Bilateral upper extremity supported, During functional activity, Single extremity supported Standing balance-Leahy Scale: Poor Standing balance comment: reliant on at least one extremity support for standing balance                           ADL either performed or assessed with clinical judgement   ADL Overall ADL's : Needs assistance/impaired     Grooming: Wash/dry hands;Wash/dry face;Oral care;Contact guard assist;Standing Grooming Details (indicate cue type and reason): used sink for support Upper Body Bathing: Set up;Sitting   Lower Body Bathing: Minimal assistance;Sitting/lateral leans;Sit to/from stand Lower Body Bathing Details (indicate cue type and reason): assistance with peri area bottom when standing Upper Body Dressing : Set up;Sitting   Lower Body Dressing: Maximal assistance;Sitting/lateral leans Lower Body Dressing Details (indicate cue type and reason): for socks Toilet Transfer: Minimal assistance;Ambulation;BSC/3in1;Rolling walker (2 wheels) Toilet Transfer Details (indicate cue type and reason): to toilet with BSC over toilet Toileting- Clothing Manipulation and Hygiene: Minimal assistance;Sitting/lateral lean;Sit to/from stand              Extremity/Trunk Assessment              Vision       Perception     Praxis  Communication Communication Communication: No apparent difficulties   Cognition Arousal: Alert Behavior During Therapy: WFL for tasks assessed/performed Cognition: No apparent impairments                                Following commands: Intact        Cueing   Cueing Techniques: Verbal cues, Gestural cues  Exercises      Shoulder Instructions       General Comments patient declining staying up in recliner at end of session due to patient states bed is more comfortable for his back    Pertinent Vitals/ Pain       Pain Assessment Pain Assessment: Faces Faces Pain Scale: Hurts little more Pain Location: back Pain Descriptors / Indicators: Discomfort, Grimacing Pain Intervention(s): Limited activity within patient's tolerance, Monitored during session, Repositioned, Premedicated before session, RN gave pain meds during session  Home Living                                          Prior Functioning/Environment              Frequency  Min 2X/week        Progress Toward Goals  OT Goals(current goals can now be found in the care plan section)  Progress towards OT goals: Progressing toward goals  Acute Rehab OT Goals Patient Stated Goal: to go back to bed OT Goal Formulation: With patient Time For Goal Achievement: 03/11/24 Potential to Achieve Goals: Good ADL Goals Pt Will Perform Grooming: with modified independence;standing Pt Will Perform Lower Body Bathing: with supervision;sit to/from stand;sitting/lateral leans;with adaptive equipment Pt Will Transfer to Toilet: with set-up;ambulating Pt/caregiver will Perform Home Exercise Program: Increased strength;Both right and left upper extremity;With theraband;Independently;With written HEP provided Additional ADL Goal #1: Pt to verbalize at least 3 energy conservation strategies to implement during daily routine  Plan      Co-evaluation                 AM-PAC OT 6 Clicks Daily Activity     Outcome Measure   Help from another person eating meals?: None Help from another person taking care of personal grooming?: A Little Help from another person toileting, which includes using toliet, bedpan,  or urinal?: A Little Help from another person bathing (including washing, rinsing, drying)?: A Little Help from another person to put on and taking off regular upper body clothing?: A Little Help from another person to put on and taking off regular lower body clothing?: A Lot 6 Click Score: 18    End of Session Equipment Utilized During Treatment: Gait belt;Rolling walker (2 wheels)  OT Visit Diagnosis: Unsteadiness on feet (R26.81);Other abnormalities of gait and mobility (R26.89);Muscle weakness (generalized) (M62.81)   Activity Tolerance Patient tolerated treatment well   Patient Left in bed;with call bell/phone within reach;with bed alarm set   Nurse Communication Mobility status        Time: 9058-8979 OT Time Calculation (min): 39 min  Charges: OT General Charges $OT Visit: 1 Visit OT Treatments $Self Care/Home Management : 38-52 mins  Dick Laine, OTA Acute Rehabilitation Services  Office [phone]   Jeb LITTIE Laine 03/03/2024, 10:57 AM

## 2024-03-04 DIAGNOSIS — K7460 Unspecified cirrhosis of liver: Secondary | ICD-10-CM | POA: Diagnosis not present

## 2024-03-04 DIAGNOSIS — R188 Other ascites: Secondary | ICD-10-CM | POA: Diagnosis not present

## 2024-03-04 DIAGNOSIS — Z7189 Other specified counseling: Secondary | ICD-10-CM | POA: Diagnosis not present

## 2024-03-04 DIAGNOSIS — Z515 Encounter for palliative care: Secondary | ICD-10-CM | POA: Diagnosis not present

## 2024-03-04 LAB — CBC
HCT: 28.4 % — ABNORMAL LOW (ref 39.0–52.0)
Hemoglobin: 8.9 g/dL — ABNORMAL LOW (ref 13.0–17.0)
MCH: 25.6 pg — ABNORMAL LOW (ref 26.0–34.0)
MCHC: 31.3 g/dL (ref 30.0–36.0)
MCV: 81.6 fL (ref 80.0–100.0)
Platelets: 165 K/uL (ref 150–400)
RBC: 3.48 MIL/uL — ABNORMAL LOW (ref 4.22–5.81)
RDW: 16.6 % — ABNORMAL HIGH (ref 11.5–15.5)
WBC: 9.2 K/uL (ref 4.0–10.5)
nRBC: 0 % (ref 0.0–0.2)

## 2024-03-04 LAB — BASIC METABOLIC PANEL WITH GFR
Anion gap: 9 (ref 5–15)
BUN: 24 mg/dL — ABNORMAL HIGH (ref 8–23)
CO2: 24 mmol/L (ref 22–32)
Calcium: 8.5 mg/dL — ABNORMAL LOW (ref 8.9–10.3)
Chloride: 101 mmol/L (ref 98–111)
Creatinine, Ser: 1.58 mg/dL — ABNORMAL HIGH (ref 0.61–1.24)
GFR, Estimated: 45 mL/min — ABNORMAL LOW (ref >60)
Glucose, Bld: 182 mg/dL — ABNORMAL HIGH (ref 70–99)
Potassium: 4.7 mmol/L (ref 3.5–5.1)
Sodium: 134 mmol/L — ABNORMAL LOW (ref 135–145)

## 2024-03-04 LAB — GLUCOSE, CAPILLARY
Glucose-Capillary: 132 mg/dL — ABNORMAL HIGH (ref 70–99)
Glucose-Capillary: 110 mg/dL — ABNORMAL HIGH (ref 70–99)
Glucose-Capillary: 156 mg/dL — ABNORMAL HIGH (ref 70–99)
Glucose-Capillary: 116 mg/dL — ABNORMAL HIGH (ref 70–99)

## 2024-03-04 NOTE — TOC Progression Note (Signed)
 Transition of Care [facility]) - Progression Note    Patient Details  Name: Bob Warren MRN: [id number] Date of Birth: 03-27-1946  [facility]) CM/SW Contact  Rosaline JONELLE Joe, RN Phone Number: 03/04/2024, 3:40 PM  Clinical Narrative:    CM spoke with Bernarda, NP with the patient and wife at the bedside.  Patient's family prefers [facility] for home hospice services.  I spoke with the patient and patient will need drainage evacuation bulbs starter kit once drain is placed in IR.  Patient has DME at home including ramp, RW, WC, tub bench, Rolator and 3:1.    Patient will need hospital bed.  CM will order ?pleurex catheter drainage kit for home - pending drain placement - I will have the secretary order starter kit for home.  I called Cheri, RNCM with [facility] and referral was placed for home hospice.  Magdalena is aware that patient will likely go home Saturday.  Patient is pending IR peroneal drain placement.  Patient will likely discharge home on Saturday.  PTAR will be arranged by [facility] Care management for home on Saturday.                     Expected Discharge Plan and Services                                               Social Drivers of Health (SDOH) Interventions SDOH Screenings   Food Insecurity: No Food Insecurity (02/25/2024)  Housing: Low Risk  (02/25/2024)  Transportation Needs: No Transportation Needs (02/25/2024)  Recent Concern: Transportation Needs - Unmet Transportation Needs (12/24/2023)   Received from [facility]  Utilities: Not At Risk (02/25/2024)  Financial Resource Strain: Medium Risk (12/24/2023)   Received from [facility]  Physical Activity: Sufficiently Active (11/27/2022)   Received from [facility]  Social Connections: Moderately Integrated (02/25/2024)  Stress: No Stress Concern Present (11/27/2022)   Received from [facility]  Tobacco Use: Medium Risk (02/24/2024)  Health Literacy: Low Risk   (11/27/2022)   Received from [facility]    Readmission Risk Interventions    03/04/2024    3:40 PM  Readmission Risk Prevention Plan  Transportation Screening Complete  PCP or Specialist Appt within 3-5 Days Complete  HRI or Home Care Consult Complete  Social Work Consult for Recovery Care Planning/Counseling Complete  Palliative Care Screening Complete  Medication Review Oceanographer) Complete

## 2024-03-04 NOTE — Progress Notes (Signed)
 Palliative:  ***  Yong Channel, NP Palliative Medicine Team Pager [phone] (Please see [email or web address] for schedule) Team Phone [phone]

## 2024-03-04 NOTE — Progress Notes (Signed)
 HD#6 SUBJECTIVE:  Patient Summary: Bob Warren is a 78 y.o. with a pertinent PMH of GAVE, myasthenia gravis, HFpEF (60-65%), HTN, HLD, T2DM, GERD, who presented with abdominal distention, bilateral leg swelling, and shortness of breath and admitted for volume overload due to decompensated cirrhosis.  Overnight Events: none  Interim History: Patient is doing well this morning without any new or worsening complaints. Reports feeling like his normal self. He denies headaches, abdominal pain, dizziness. He does endorse pain of his lower legs right around the indentation of his sock, which he has had before. He has been able to walk a little which does not make his leg pain worse. He reports a BM last night, but has not had one yet this morning. He is unsure how many BM he had yesterday, but he said 2 BM sounds about right. He still does not have much of an appetite. He does remember Palliative coming by to talk yesterday, but does not recall a lot of the conversation. He remembers talking about keeping a watch on his abdominal fluid level and possibly removal of it.   OBJECTIVE:  Vital Signs: Vitals:   03/04/24 0000 03/04/24 0430 03/04/24 0431 03/04/24 0829  BP: 127/65 129/69  124/65  Pulse: 82 74  70  Resp: 18 18  18   Temp: 97.6 F (36.4 C) 98 F (36.7 C)  98.2 F (36.8 C)  TempSrc:    Oral  SpO2: 98% 98%  99%  Weight:   82 kg   Height:       Supplemental O2: Room Air SpO2: 99 %  Filed Weights   03/02/24 0500 03/03/24 0500 03/04/24 0431  Weight: 84.3 kg 84.4 kg 82 kg     Intake/Output Summary (Last 24 hours) at 03/04/2024 1041 Last data filed at 03/04/2024 0400 Gross per 24 hour  Intake --  Output 1100 ml  Net -1100 ml   Net IO Since Admission: -5,519 mL [03/04/24 1041]  Physical Exam: Constitutional: Chronically ill appearing male laying in bed. In no acute distress. Cardio:Regular rate and rhythm.  Pulm: Increased work of breathing on room air. Bilateral lungs sound  fluid-filled.  Abdomen: Soft, nontender, distended, tense, positive bowel sounds MSK: Trace lower extremity edema bilaterally, chronically dry, warm skin to BLE Skin:Warm and dry. Neuro:Alert and oriented x4. No focal deficit noted.  Psych: Pleasant but tired mood and affect.  Patient Lines/Drains/Airways Status     Active Line/Drains/Airways     Name Placement date Placement time Site Days   Peripheral IV 02/25/24 20 G Left Antecubital 02/25/24  1700  Antecubital  1            Pertinent labs and imaging:      Latest Ref Rng & Units 03/04/2024    2:02 AM 03/03/2024    9:02 AM 03/02/2024    3:28 AM  CBC  WBC 4.0 - 10.5 K/uL 9.2  7.4  6.5   Hemoglobin 13.0 - 17.0 g/dL 8.9  8.8  8.8   Hematocrit 39.0 - 52.0 % 28.4  27.9  28.4   Platelets 150 - 400 K/uL 165  134  148        Latest Ref Rng & Units 03/04/2024    2:02 AM 03/03/2024    9:02 AM 03/02/2024    3:28 AM  CMP  Glucose 70 - 99 mg/dL 817  858  714   BUN 8 - 23 mg/dL 24  25  29    Creatinine 0.61 - 1.24 mg/dL 8.41  1.49  1.58   Sodium 135 - 145 mmol/L 134  135  134   Potassium 3.5 - 5.1 mmol/L 4.7  3.9  3.3   Chloride 98 - 111 mmol/L 101  100  99   CO2 22 - 32 mmol/L 24  26  26    Calcium  8.9 - 10.3 mg/dL 8.5  8.6  8.5     No results found.    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  Plan: #Hypokalemia, resolved Patient K today 134. Will continue to monitor with daily BMPs - Trend BMP  #Altered Mental Status, Improving Patient became altered 8/2 afternoon with decreased attention, delayed responses, and asterixis on exam. High suspicion for hepatic encephalopathy. Asterixis negative. Lactulose  started 8/3 with a goal of 3 BM a day. Patient only had 2 reported BM yesterday, but is not completely sure. He is still on a lactulose  dose of 30g TID until BM goals achieved. Palliative care saw patient, wife, and stepdaughter and discussed patient's GOC. He was able to sustain attention and ultimately decided to become DNR/DNI. The family is still considering home with hospice, and wanted some time to consider. Social work was consulted. Patient's abdomen feels tight, may need to consider an additional paracentesis and peritoneal drain to be managed by hospice before discharge.  - Palliative discussion today - IR for paracentesis, peritoneal drain before d/c - Lactulose  30g TID  #Decompensated cirrhosis suspect from [facility] #History of esophageal varices #HFpEF (60-65%) Continues to improve after receiving PO Lasix  again yesterday and starting spironolactone .  Blood pressures have also held without significant decrease.  Continues to have normal colored bowel movements.  Lower extremity edema is almost gone and his abdominal swelling is stable.  He has follow-up with GI on 8/28 and would likely get every 6 week paracenteses outpatient.Ultrasound yesterday found minimal fluid in the right lower lung. Patient had no complaints and felt he could breathe normally on room air.  - Continue PO Lasix  40 mg twice daily and spironolactone  to 50 mg daily   #T2DM, controlled Last A1c 6.5%  in June.  Fasting blood sugars between 190-210s. CBG today 182. Added 4 units of mealtime insulin . - Semglee  15 units daily -- Very Sensitive SSI with meals  #HTN #HLD Continues to be normotensive with the addition of 50 mg spironolactone . - Lasix  and spironolactone  as above - Continue diltiazem  120 mg daily  #Hyponatremia, resolved  #CKD Stage 3 Creatinine stable, 1.58 today  #Gastric Antral Vascular Ectasia (GAVE) #Iron Deficiency Anemia Hemoglobin 8.9  today with no signs of active bleeding.  #Myasthenia Gravis Stable without acute flares off medication.  #Chronic back pain #Chronic right hip pain Chronic. CT  hip in 2024 showed severe right hip degenerative changes. PTA on tramadol  50mg  usually taken once to twice a day as needed.  - Voltaren  gel, heating pad - Lidocaine  patch for further pain management  - Home Tramadol  50mg  BID PRN   Best Practice: Diet: Cardiac diet IVF: Fluids: none VTE: enoxaparin  (LOVENOX ) injection 40 mg Start: 02/25/24 1100 Code: Full  Disposition planning: Therapy Recs: Pending,  Family Contact: stepdaughter DISPO: Anticipated discharge 0-1 days.  May discharge this afternoon if patient and family have a home return however may discharge tomorrow morning.  Signature:  Laquanta Hummel [facility]  10:41 AM, 03/04/2024  On Call pager [phone]

## 2024-03-04 NOTE — Progress Notes (Signed)
   I have been able to speak with the pt's wife Joen and confirm interest for hospice care. She is in agreement with going home and focusing on comfort. We have ordered a hospital bed, over bed table and also a BSC to be delivered tomorrow afternoon per request of the pt's wife. We will plan to meet on day of d/c from hospital to complete intake visit for hospice care and follow after d/c for comfort care at home.   Magdalena Berber RN [phone]

## 2024-03-04 NOTE — Plan of Care (Signed)

## 2024-03-04 NOTE — Progress Notes (Signed)
 Mobility Specialist: Progress Note   03/04/24 1600  Mobility  Activity Ambulated with assistance  Level of Assistance Contact guard assist, steadying assist  Assistive Device Front wheel walker  Distance Ambulated (ft) 25 ft  Activity Response Tolerated well  Mobility Referral Yes  Mobility visit 1 Mobility  Mobility Specialist Start Time (ACUTE ONLY) [id number]  Mobility Specialist Stop Time (ACUTE ONLY) 0934  Mobility Specialist Time Calculation (min) (ACUTE ONLY) 12 min    Pt received in bed, agreeable to mobility session. MinA for bed mobility to assist with trunk elevation. ModA for STS. CGA for ambulation. Ambulated to the door and back. Encouraged pt to to increase distance and see if he can make it to the hallway, but stated he probably not and that he was fatigued. Once returned to bedside agreed to try to perform additional STS, but was a heavy modA to perform one so further mobility was deferred. Returned pt to supine. Left in bed with all needs met, call bell in reach.   Ileana Lute Mobility Specialist Please contact via SecureChat or Rehab office at [phone]

## 2024-03-04 NOTE — Plan of Care (Signed)

## 2024-03-05 LAB — GLUCOSE, CAPILLARY
Glucose-Capillary: 123 mg/dL — ABNORMAL HIGH (ref 70–99)
Glucose-Capillary: 63 mg/dL — ABNORMAL LOW (ref 70–99)
Glucose-Capillary: 176 mg/dL — ABNORMAL HIGH (ref 70–99)
Glucose-Capillary: 180 mg/dL — ABNORMAL HIGH (ref 70–99)
Glucose-Capillary: 297 mg/dL — ABNORMAL HIGH (ref 70–99)

## 2024-03-05 MED ORDER — DEXTROSE 50 % IV SOLN
1.0000 | Freq: Once | INTRAVENOUS | Status: AC
  Administered 2024-03-05: 50 mL via INTRAVENOUS
  Filled 2024-03-05: qty 50

## 2024-03-05 MED ORDER — LACTULOSE 10 GM/15ML PO SOLN
30.0000 g | Freq: Once | ORAL | Status: AC
  Filled 2024-03-05: qty 45

## 2024-03-05 NOTE — Progress Notes (Signed)
 HD#7 SUBJECTIVE:  Patient Summary: Bob Warren is a 78 y.o. with a pertinent PMH of GAVE, myasthenia gravis, HFpEF (60-65%), HTN, HLD, T2DM, GERD, who presented with abdominal distention, bilateral leg swelling, and shortness of breath and admitted for volume overload due to decompensated cirrhosis.  Overnight Events: none  Interim History: Patient is doing well this morning without any new or worsening complaints. Slept pretty well last night. Bob Warren denies any belly pain, but does notice some tightness. Denies abdominal pain with palpation. Bob Warren reports about 3 BM yesterday. Is urinating without issue. Bob Warren remembers that Bob Warren is NPO and understands Bob Warren shouldn't eat until confirmation from Radiology.   OBJECTIVE:  Vital Signs: Vitals:   03/04/24 2320 03/05/24 0356 03/05/24 0500 03/05/24 0734  BP: (!) 147/82 131/76  (!) 155/68  Pulse: 87 87  80  Resp: 18 18  17   Temp: 97.7 F (36.5 C) 97.7 F (36.5 C)  97.7 F (36.5 C)  TempSrc:      SpO2: 97% 98%  98%  Weight:   83.5 kg   Height:       Supplemental O2: Room Air SpO2: 98 %  Filed Weights   03/03/24 0500 03/04/24 0431 03/05/24 0500  Weight: 84.4 kg 82 kg 83.5 kg     Intake/Output Summary (Last 24 hours) at 03/05/2024 1106 Last data filed at 03/04/2024 2218 Gross per 24 hour  Intake --  Output 700 ml  Net -700 ml   Net IO Since Admission: -6,219 mL [03/05/24 1106]  Physical Exam: Constitutional: Chronically ill appearing male laying in bed. In no acute distress. Cardio:Regular rate and rhythm.  Pulm: Increased work of breathing on room air. Bilateral lungs sound fluid-filled.  Abdomen: Soft, nontender, distended, tense, positive bowel sounds MSK: Trace lower extremity edema bilaterally, chronically dry, warm skin to BLE Skin:Warm and dry. Neuro:Alert and oriented x4. No focal deficit noted.  Psych: Pleasant but tired mood and affect.  Patient Lines/Drains/Airways Status     Active Line/Drains/Airways     Name Placement  date Placement time Site Days   Peripheral IV 02/25/24 20 G Left Antecubital 02/25/24  1700  Antecubital  1            Pertinent labs and imaging:      Latest Ref Rng & Units 03/04/2024    2:02 AM 03/03/2024    9:02 AM 03/02/2024    3:28 AM  CBC  WBC 4.0 - 10.5 K/uL 9.2  7.4  6.5   Hemoglobin 13.0 - 17.0 g/dL 8.9  8.8  8.8   Hematocrit 39.0 - 52.0 % 28.4  27.9  28.4   Platelets 150 - 400 K/uL 165  134  148        Latest Ref Rng & Units 03/04/2024    2:02 AM 03/03/2024    9:02 AM 03/02/2024    3:28 AM  CMP  Glucose 70 - 99 mg/dL 817  858  714   BUN 8 - 23 mg/dL 24  25  29    Creatinine 0.61 - 1.24 mg/dL 8.41  8.50  8.41   Sodium 135 - 145 mmol/L 134  135  134   Potassium 3.5 - 5.1 mmol/L 4.7  3.9  3.3   Chloride 98 - 111 mmol/L 101  100  99   CO2 22 - 32 mmol/L 24  26  26    Calcium  8.9 - 10.3 mg/dL 8.5  8.6  8.5     No results found.    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  Plan: #Altered Mental Status, Improving Patient became altered 8/2 afternoon with decreased attention, delayed responses, and asterixis on exam. High  suspicion for hepatic encephalopathy. Asterixis negative. Lactulose  started 8/3 with a goal of 3 BM a day. Patient only had 2 reported BM yesterday, but is not completely sure. Bob Warren is still on a lactulose  dose of 30g TID until BM goals achieved. Palliative care saw patient, wife, and stepdaughter and discussed patient's GOC. Patient and family agreeable to [facility] for home hospice services. Social work is involved and will order appropriate supplies for the family, and likely to discharge patient on Saturday. Patient's abdomen feels tight, IR consulted for an additional paracentesis and peritoneal drain to be managed by hospice before discharge.  - Palliative discussion today - IR for paracentesis, peritoneal drain before d/c - Lactulose  30g TID  #Decompensated cirrhosis suspect from [facility] #History of esophageal varices #HFpEF (60-65%) Continues to improve after receiving PO Lasix  again yesterday and starting spironolactone .  Blood pressures have also held without significant decrease.  Continues to have normal colored bowel movements.  Lower extremity edema is almost gone and his abdominal swelling is stable.  Bob Warren has follow-up with GI on 8/28 and would likely get every 6 week paracenteses outpatient.Ultrasound yesterday found minimal fluid in the right lower lung. Patient had no complaints and felt Bob Warren could breathe normally on room air.  - Continue PO Lasix  40 mg twice daily and spironolactone  to 50 mg daily   #T2DM, controlled Last A1c 6.5% in June.  Fasting blood sugars between 190-210s. CBG today 123. Added 4 units of mealtime insulin . - Semglee  15 units daily -- Very Sensitive SSI with meals  #HTN #HLD Continues to be normotensive with the addition of 50 mg spironolactone . - Lasix  and spironolactone  as above - Continue diltiazem  120 mg daily  #Hyponatremia, resolved  #Hypokalemia, resolved  #CKD Stage 3 Creatinine stable, 1.58, around his baseline.   #Gastric Antral  Vascular Ectasia (GAVE) #Iron Deficiency Anemia Hemoglobin 8.9  yesterday with no signs of active bleeding.  #Myasthenia Gravis Stable without acute flares off medication.  #Chronic back pain #Chronic right hip pain Chronic. CT hip in 2024 showed severe right hip degenerative changes. PTA on tramadol  50mg  usually taken once to twice a day as needed.  - Voltaren  gel, heating pad - Lidocaine  patch for further pain management  - Home Tramadol  50mg  BID PRN   Best Practice: Diet: Cardiac diet IVF: Fluids: none VTE: enoxaparin  (LOVENOX ) injection 40 mg Start: 02/25/24 1100 Code: Full  Disposition planning: Therapy Recs: Pending,  Family Contact: stepdaughter DISPO: Anticipated discharge 0-1 days.  May discharge this afternoon if patient  and family have a home return however may discharge tomorrow morning.  Signature:  Denny Lave [facility]  11:06 AM, 03/05/2024  On Call pager [phone]

## 2024-03-05 NOTE — Plan of Care (Signed)

## 2024-03-05 NOTE — Progress Notes (Signed)
 Physical Therapy Treatment Patient Details Name: Bob Warren MRN: [id number] DOB: 1945/10/26 Today's Date: 03/05/2024   History of Present Illness Pt is a 78 y/o male presenting with SOB, abdominal distension and BLE edema. Admitted for volume overload, decompensated cirrhosis and hyponatremia. Paracentesis 7/30 removing 9.4L. PMH: chronic HFpEF, myasthenia gravis, PAF, GAVE, chronic bilateral lower extremity edema, hypertension, hyperlipidemia, GERD, type 2 diabetes, cirrhosis d/t MASH    PT Comments  On entry, pt reports need to use bathroom. Pt requiring cuing to slow down despite urgency. Pt is modA for bed mobility, and contact guard for transfers and ambulation. Pt needs increased cuing for proximity to RW especially to be able to navigate in tight spaces like the bathroom. Pt very fatigued after using bathroom declining further therapy. D/c plan remains appropriate at this time. PT will continue to follow acutely.     If plan is discharge home, recommend the following: A little help with walking and/or transfers;A little help with bathing/dressing/bathroom;Assistance with cooking/housework;Assist for transportation;Help with stairs or ramp for entrance   Can travel by private vehicle      Yes  Equipment Recommendations  None recommended by PT       Precautions / Restrictions Precautions Precautions: Fall Restrictions Weight Bearing Restrictions Per Provider Order: No     Mobility  Bed Mobility Overal bed mobility: Needs Assistance Bed Mobility: Supine to Sit, Sit to Supine     Supine to sit: Mod assist Sit to supine: Mod assist   General bed mobility comments: with HoB elevated pt able to utilize rail to pull to EoB, requires modA for pad scoot of hips towards EoB. After using bathroom pt requires mod A for returning LE to bed    Transfers Overall transfer level: Needs assistance Equipment used: Rolling walker (2 wheels) Transfers: Sit to/from Stand Sit to Stand:  Contact guard assist           General transfer comment: Bed slightly elevated, no physical asisst to power up, able to power up from [facility] over toilet both with contact guard for safety    Ambulation/Gait Ambulation/Gait assistance: Contact guard assist Gait Distance (Feet): 12 Feet (x2) Assistive device: Rolling walker (2 wheels) Gait Pattern/deviations: Step-through pattern, Trunk flexed, Decreased step length - right, Decreased step length - left Gait velocity: decreased Gait velocity interpretation: <1.31 ft/sec, indicative of household ambulator   General Gait Details: pt needs increased cuing for proximity to RW especially with navingation in small bathroom,         Balance Overall balance assessment: Needs assistance Sitting-balance support: No upper extremity supported, Feet supported Sitting balance-Leahy Scale: Fair     Standing balance support: Bilateral upper extremity supported, During functional activity, Single extremity supported Standing balance-Leahy Scale: Poor Standing balance comment: reliant on at least one extremity support for standing balance                            Communication Communication Communication: No apparent difficulties  Cognition Arousal: Alert Behavior During Therapy: WFL for tasks assessed/performed   PT - Cognitive impairments: No apparent impairments                         Following commands: Intact      Cueing Cueing Techniques: Verbal cues, Gestural cues     General Comments General comments (skin integrity, edema, etc.): wife and granddaughter in room providing support      Pertinent  Vitals/Pain Pain Assessment Pain Assessment: Faces Faces Pain Scale: Hurts little more Pain Location: back Pain Descriptors / Indicators: Discomfort, Grimacing Pain Intervention(s): Limited activity within patient's tolerance, Monitored during session, Repositioned     PT Goals (current goals can now be  found in the care plan section) Acute Rehab PT Goals PT Goal Formulation: With patient Time For Goal Achievement: 03/11/24 Potential to Achieve Goals: Fair Progress towards PT goals: Progressing toward goals    Frequency    Min 2X/week       AM-PAC PT 6 Clicks Mobility   Outcome Measure  Help needed turning from your back to your side while in a flat bed without using bedrails?: None Help needed moving from lying on your back to sitting on the side of a flat bed without using bedrails?: A Little Help needed moving to and from a bed to a chair (including a wheelchair)?: A Little Help needed standing up from a chair using your arms (e.g., wheelchair or bedside chair)?: A Little Help needed to walk in hospital room?: A Little Help needed climbing 3-5 steps with a railing? : A Lot 6 Click Score: 18    End of Session Equipment Utilized During Treatment: Gait belt Activity Tolerance: Patient tolerated treatment well Patient left: in bed;with call bell/phone within reach;with bed alarm set;with family/visitor present;with nursing/sitter in room Nurse Communication: Mobility status PT Visit Diagnosis: Unsteadiness on feet (R26.81);Other abnormalities of gait and mobility (R26.89);Muscle weakness (generalized) (M62.81)     Time: 8561-8541 PT Time Calculation (min) (ACUTE ONLY): 20 min  Charges:    $Therapeutic Activity: 8-22 mins PT General Charges $$ ACUTE PT VISIT: 1 Visit                     Savon Bordonaro B. Fleeta Lapidus PT, DPT Acute Rehabilitation Services Please use secure chat or  Call Office [phone]    Almarie KATHEE Fleeta [facility] 03/05/2024, 4:04 PM

## 2024-03-05 NOTE — Progress Notes (Signed)
 Discussed the plan to perform peritoneal drain on upcoming Monday due to inability to complete it today with the patient's caretaker Joen.  She expressed some concern about the patient not getting the care that he needed and cited an episode that the patient was left on the toilet for an unknown period of time.  Also expresses concerns that his confusion has returned some today which was reported by another family member.  We went to evaluate the patient who appeared stable from earlier this morning and was alert and oriented to person, place, situation, and time.  He did have mild asterixis on exam and continues to have a distended but not tense or tender abdomen.  Not showing any signs of acute infection, bleeding, or acute worsening in his confusion.  However he has not had any bowel movements today. -Additional dose of 30 g lactulose , will redose if needed to have at least 2 bowel movements this afternoon - Please page for any acute abdominal pain, nausea, vomiting, dark bowel movements, red bowel movements, or any acute confusion  Fairy Pool, DO Internal Medicine Resident, PGY-3 Please contact the on call pager at [phone] for any urgent or emergent needs. 3:48 PM 03/05/2024

## 2024-03-05 NOTE — TOC Progression Note (Signed)
 Transition of Care [facility]) - Progression Note    Patient Details  Name: Bob Warren MRN: [id number] Date of Birth: 1946-07-15  Transition of Care [facility]) CM/SW Contact  Rosaline JONELLE Joe, RN Phone Number: 03/05/2024, 4:41 PM  Clinical Narrative:    CM with IP Care management team will continue to follow the patient for return to home next week with HOP.  Per MD note - patient is scheduled for Peritoneal drain placement on Monday in IR while inpatient.                       Expected Discharge Plan and Services                                               Social Drivers of Health (SDOH) Interventions SDOH Screenings   Food Insecurity: No Food Insecurity (02/25/2024)  Housing: Low Risk  (02/25/2024)  Transportation Needs: No Transportation Needs (02/25/2024)  Recent Concern: Transportation Needs - Unmet Transportation Needs (12/24/2023)   Received from [facility]  Utilities: Not At Risk (02/25/2024)  Financial Resource Strain: Medium Risk (12/24/2023)   Received from [facility]  Physical Activity: Sufficiently Active (11/27/2022)   Received from [facility]  Social Connections: Moderately Integrated (02/25/2024)  Stress: No Stress Concern Present (11/27/2022)   Received from [facility]  Tobacco Use: Medium Risk (02/24/2024)  Health Literacy: Low Risk  (11/27/2022)   [facility]    Readmission Risk Interventions    03/04/2024    3:40 PM  Readmission Risk Prevention Plan  Transportation Screening Complete  PCP or Specialist Appt within 3-5 Days Complete  HRI or Home Care Consult Complete  Social Work Consult for Recovery Care Planning/Counseling Complete  Palliative Care Screening Complete  Medication Review [employer]) Complete

## 2024-03-05 NOTE — Progress Notes (Signed)
 IMTS Cross Cover Note  Received request from out going resident team to evaluate patient at bedside because concern for worsening confusion in the context of hepatic encephalopathy.  Patient was resting comfortably in bed, awake, and interactive. Told me he has liver problems and that he has been having trouble passing BM today. Has attempted to pass 4x Bms today with minimal amount per RN at bedside. Feels better than when he arrived in the hospital. Denies chest pain, shortness of breath, nausea or vomiting. Denies pruritus.   Exam: Vitals:   03/05/24 1633 03/05/24 2033  BP: 125/64 135/70  Pulse: 81 93  Resp:  18  Temp: 98.2 F (36.8 C) 98 F (36.7 C)  SpO2: 99% 100%    General: Resting comfortably without distress Cardio: Irregular Pulm: CTAB anterolaterally. On RA Abdominal: distended abdomen with shifting dullness. Non tender, soft.. Flapping tremor with arm and hand extension Neuro: Alert, oriented to self and place. Not oriented to month, year, or date. Attempted to look at the calendar to orient himself but quickly got frustrated.  Reviewed I/O chart, AM labs and day team notes  Carlin Daring a person with GAVE, MG, HFpEF, HTN, T2DM, admitted for decompensated cirrhosis being treated for volume overload and hepatic encephalopathy with recent worsening in encephalopathy. Of note, patient is awaiting additional paracentesis and peritoneal drain before discharge with hospice.  Compared to morning notes, patient's encephalopathy is worsening. Per RN, the volume and frequency of BM have decreased today. She took care of the patient yesterday. Thus, despite 3 BM charted, the volume of them is minimal. Per day team, patient is to receive an additional dose of lactulose  tonight. Discussed with RN that if patient unable to take PO, we will link enema dosing to PO lactulose .  Plan: - 30g lactulose  tonight. Will switch to Lactulose  enema if unable to take PO - Continue lactulose  until  improvement in encephalopathy - Will continue to monitor overnight. RN notified.  Hadassah Ala, MD Internal Medicine Resident PGY-3 Please contact the on-call pager [phone].

## 2024-03-05 NOTE — Plan of Care (Signed)
  Problem: Education: Goal: Knowledge of General Education information will improve Description: Including pain rating scale, medication(s)/side effects and non-pharmacologic comfort measures Outcome: Adequate for Discharge   Problem: Health Behavior/Discharge Planning: Goal: Ability to manage health-related needs will improve Outcome: Adequate for Discharge   Problem: Clinical Measurements: Goal: Ability to maintain clinical measurements within normal limits will improve Outcome: Adequate for Discharge Goal: Will remain free from infection Outcome: Adequate for Discharge Goal: Diagnostic test results will improve Outcome: Adequate for Discharge Goal: Respiratory complications will improve Outcome: Adequate for Discharge Goal: Cardiovascular complication will be avoided Outcome: Adequate for Discharge   Problem: Nutrition: Goal: Adequate nutrition will be maintained Outcome: Adequate for Discharge   Problem: Coping: Goal: Level of anxiety will decrease Outcome: Adequate for Discharge   Problem: Activity: Goal: Risk for activity intolerance will decrease Outcome: Adequate for Discharge   Problem: Elimination: Goal: Will not experience complications related to bowel motility Outcome: Adequate for Discharge Goal: Will not experience complications related to urinary retention Outcome: Adequate for Discharge

## 2024-03-06 LAB — BASIC METABOLIC PANEL WITH GFR
Anion gap: 10 (ref 5–15)
BUN: 20 mg/dL (ref 8–23)
CO2: 23 mmol/L (ref 22–32)
Calcium: 8.7 mg/dL — ABNORMAL LOW (ref 8.9–10.3)
Chloride: 100 mmol/L (ref 98–111)
Creatinine, Ser: 1.65 mg/dL — ABNORMAL HIGH (ref 0.61–1.24)
GFR, Estimated: 43 mL/min — ABNORMAL LOW (ref >60)
Glucose, Bld: 199 mg/dL — ABNORMAL HIGH (ref 70–99)
Potassium: 4.2 mmol/L (ref 3.5–5.1)
Sodium: 133 mmol/L — ABNORMAL LOW (ref 135–145)

## 2024-03-06 LAB — CBC WITH DIFFERENTIAL/PLATELET
Abs Immature Granulocytes: 0.06 K/uL (ref 0.00–0.07)
Basophils Absolute: 0.1 K/uL (ref 0.0–0.1)
Basophils Relative: 1 %
Eosinophils Absolute: 0.1 K/uL (ref 0.0–0.5)
Eosinophils Relative: 1 %
HCT: 29.3 % — ABNORMAL LOW (ref 39.0–52.0)
Hemoglobin: 8.9 g/dL — ABNORMAL LOW (ref 13.0–17.0)
Immature Granulocytes: 1 %
Lymphocytes Relative: 9 %
Lymphs Abs: 0.8 K/uL (ref 0.7–4.0)
MCH: 25.1 pg — ABNORMAL LOW (ref 26.0–34.0)
MCHC: 30.4 g/dL (ref 30.0–36.0)
MCV: 82.5 fL (ref 80.0–100.0)
Monocytes Absolute: 0.8 K/uL (ref 0.1–1.0)
Monocytes Relative: 9 %
Neutro Abs: 6.6 K/uL (ref 1.7–7.7)
Neutrophils Relative %: 79 %
Platelets: 169 K/uL (ref 150–400)
RBC: 3.55 MIL/uL — ABNORMAL LOW (ref 4.22–5.81)
RDW: 16.6 % — ABNORMAL HIGH (ref 11.5–15.5)
WBC: 8.3 K/uL (ref 4.0–10.5)
nRBC: 0 % (ref 0.0–0.2)

## 2024-03-06 LAB — GLUCOSE, CAPILLARY
Glucose-Capillary: 206 mg/dL — ABNORMAL HIGH (ref 70–99)
Glucose-Capillary: 155 mg/dL — ABNORMAL HIGH (ref 70–99)
Glucose-Capillary: 95 mg/dL (ref 70–99)
Glucose-Capillary: 116 mg/dL — ABNORMAL HIGH (ref 70–99)

## 2024-03-06 MED ORDER — LACTULOSE 10 GM/15ML PO SOLN
30.0000 g | Freq: Four times a day (QID) | ORAL | Status: DC
  Administered 2024-03-06 – 2024-03-08 (×11): 30 g via ORAL
  Filled 2024-03-06: qty 45
  Filled 2024-03-06: qty 60
  Filled 2024-03-06 (×4): qty 45
  Filled 2024-03-06: qty 60
  Filled 2024-03-06 (×2): qty 45

## 2024-03-06 NOTE — Progress Notes (Signed)
 HD#7 SUBJECTIVE:  Patient Summary: Bob Warren is a 78 y.o. with a pertinent PMH of GAVE, myasthenia gravis, HFpEF (60-65%), HTN, HLD, T2DM, GERD, who presented with abdominal distention, bilateral leg swelling, and shortness of breath and admitted for volume overload due to decompensated cirrhosis now complicated by hepatic encephalopathy with plans to discharge home with hospice pending peritoneal drain placement.  Overnight Events: Some increased confusion late yesterday that required additional lactulose .  Interim History:  Patient is doing much better this morning and is back to baseline.  He has had 1 bowel movement this morning.  Denies any shortness of breath or other new or worsening symptoms.  OBJECTIVE:  Vital Signs: Vitals:   03/06/24 0000 03/06/24 0426 03/06/24 0427 03/06/24 0440  BP: 137/74  138/66 121/78  Pulse: 99  (!) 101 97  Resp: 18  18 18   Temp: 97.9 F (36.6 C)  98.6 F (37 C) 98 F (36.7 C)  TempSrc: Oral   Oral  SpO2: 100%  96% 100%  Weight:  83.2 kg    Height:       Supplemental O2: Room Air SpO2: 100 %  Filed Weights   03/04/24 0431 03/05/24 0500 03/06/24 0426  Weight: 82 kg 83.5 kg 83.2 kg     Intake/Output Summary (Last 24 hours) at 03/06/2024 0603 Last data filed at 03/05/2024 2052 Gross per 24 hour  Intake --  Output 500 ml  Net -500 ml   Net IO Since Admission: -6,719 mL [03/06/24 0603]  Physical Exam: Constitutional: Chronically ill appearing male laying in bed. In no acute distress. Cardio:Regular rate and rhythm.  Pulm: Normal work of breathing on room air Abdomen: Soft, nontender, distended, positive bowel sounds MSK: Trace lower extremity edema bilaterally, chronically dry, warm skin to BLE Neuro:Alert and oriented x4.  No asterixis  Patient Lines/Drains/Airways Status     Active Line/Drains/Airways     Name Placement date Placement time Site Days   Peripheral IV 02/25/24 20 G Left Antecubital 02/25/24  1700  Antecubital  1             Pertinent labs and imaging:      Latest Ref Rng & Units 03/04/2024    2:02 AM 03/03/2024    9:02 AM 03/02/2024    3:28 AM  CBC  WBC 4.0 - 10.5 K/uL 9.2  7.4  6.5   Hemoglobin 13.0 - 17.0 g/dL 8.9  8.8  8.8   Hematocrit 39.0 - 52.0 % 28.4  27.9  28.4   Platelets 150 - 400 K/uL 165  134  148        Latest Ref Rng & Units 03/04/2024    2:02 AM 03/03/2024    9:02 AM 03/02/2024    3:28 AM  CMP  Glucose 70 - 99 mg/dL 817  858  714   BUN 8 - 23 mg/dL 24  25  29    Creatinine 0.61 - 1.24 mg/dL 8.41  8.50  8.41   Sodium 135 - 145 mmol/L 134  135  134   Potassium 3.5 - 5.1 mmol/L 4.7  3.9  3.3   Chloride 98 - 111 mmol/L 101  100  99   CO2 22 - 32 mmol/L 24  26  26    Calcium  8.9 - 10.3 mg/dL 8.5  8.6  8.5     No results found.    ASSESSMENT/PLAN:  Assessment: Principal Problem:   Cirrhosis of liver with ascites (HCC) Active Problems:   Myasthenia gravis (HCC)  DM (diabetes mellitus) (HCC)   Hypertension   Esophageal varices without bleeding (HCC)   GAVE (gastric antral vascular ectasia)   Portal hypertensive gastropathy (HCC)   Ascites   Hypervolemia   (HFpEF) heart failure with preserved ejection fraction (HCC)   Atrial fibrillation (HCC)   CKD (chronic kidney disease) stage 3, GFR 30-59 ml/min (HCC)   Hepatic encephalopathy (HCC)   Acute on chronic congestive heart failure (HCC)  Bob Warren is a 77y.o. 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  Plan: #Decompensated cirrhosis with new hepatic encephalopathy #MASH #History of esophageal varices Continues to have varying response to lactulose  dosing and yesterday afternoon developed worsening encephalopathy.  He was given additional lactulose  yesterday  afternoon and yesterday evening.  Much improved this morning and back at baseline.  Overall pending home with hospice after peritoneal drain placement scheduled for Monday. - Continue Lasix  40 mg twice daily and spironolactone  50 mg daily - Increase lactulose  30 g to 4 times daily  #HFpEF (60-65%) Volume status significantly impacted by decompensated cirrhosis as above.  - Diuresis as above  #T2DM, controlled Last A1c 6.5% in June. -Semglee  15 units daily -Very Sensitive SSI and 4 units aspart 3 times daily with meals  #HTN #HLD Continues to be normotensive with the addition of 50 mg spironolactone . - Lasix  and spironolactone  as above - Continue diltiazem  120 mg daily  #CKD Stage 3 Creatinine stable, 1.58, around his baseline.   #Gastric Antral Vascular Ectasia (GAVE) #Iron Deficiency Anemia Hemoglobin 8.9  yesterday with no signs of active bleeding.  #Myasthenia Gravis Stable without acute flares off medication.  #Chronic back pain #Chronic right hip pain Chronic. CT hip in 2024 showed severe right hip degenerative changes. PTA on tramadol  50mg  usually taken once to twice a day as needed.  - Voltaren  gel, heating pad - Lidocaine  patch for further pain management  - Home Tramadol  50mg  BID PRN  #Hyponatremia, resolved #Hypokalemia, resolved  Best Practice: Diet: Cardiac diet IVF: Fluids: none VTE: enoxaparin  (LOVENOX ) injection 40 mg Start: 02/25/24 1100 Code: Full  Disposition planning: Therapy Recs: Pending,  Family Contact: stepdaughter DISPO: Anticipated discharge 2 days pending peritoneal drain placement.  Signature: Fairy Pool, DO Internal Medicine Resident, PGY-3 Please contact the on call pager at [phone] for any urgent or emergent needs. 6:03 AM 03/06/2024

## 2024-03-06 NOTE — Plan of Care (Signed)

## 2024-03-07 DIAGNOSIS — I130 Hypertensive heart and chronic kidney disease with heart failure and stage 1 through stage 4 chronic kidney disease, or unspecified chronic kidney disease: Secondary | ICD-10-CM

## 2024-03-07 DIAGNOSIS — Z7901 Long term (current) use of anticoagulants: Secondary | ICD-10-CM

## 2024-03-07 DIAGNOSIS — Z79899 Other long term (current) drug therapy: Secondary | ICD-10-CM

## 2024-03-07 DIAGNOSIS — D631 Anemia in chronic kidney disease: Secondary | ICD-10-CM

## 2024-03-07 DIAGNOSIS — G8929 Other chronic pain: Secondary | ICD-10-CM

## 2024-03-07 DIAGNOSIS — E1122 Type 2 diabetes mellitus with diabetic chronic kidney disease: Secondary | ICD-10-CM

## 2024-03-07 DIAGNOSIS — M25551 Pain in right hip: Secondary | ICD-10-CM

## 2024-03-07 DIAGNOSIS — G7000 Myasthenia gravis without (acute) exacerbation: Secondary | ICD-10-CM

## 2024-03-07 LAB — GLUCOSE, CAPILLARY
Glucose-Capillary: 111 mg/dL — ABNORMAL HIGH (ref 70–99)
Glucose-Capillary: 119 mg/dL — ABNORMAL HIGH (ref 70–99)
Glucose-Capillary: 115 mg/dL — ABNORMAL HIGH (ref 70–99)

## 2024-03-07 NOTE — Progress Notes (Signed)
   Spoke to the pt's daugher Joen and confirmed pt equipment was delivered to the home today.  Plan to meet them tomorrow to do intake visit for hospice wervices anticipating he will d/c if IR able to work pt in for  pleurex drain in placement.  This will be managed by [facility]. Request pt d/c home with starter kit for new plerux placement.    Thank you Magdalena Berber RN [phone]

## 2024-03-07 NOTE — Plan of Care (Signed)

## 2024-03-07 NOTE — Progress Notes (Signed)
 HD#7 SUBJECTIVE:  Patient Summary: Bob Warren is a 78 y.o. with a pertinent PMH of GAVE, myasthenia gravis, HFpEF (60-65%), HTN, HLD, T2DM, GERD, who presented with abdominal distention, bilateral leg swelling, and shortness of breath and admitted for volume overload due to decompensated cirrhosis now complicated by hepatic encephalopathy with plans to discharge home with hospice pending peritoneal drain placement.  Overnight Events: Some increased confusion late yesterday that required additional lactulose .  Interim History:  Patient is a little more encephalopathic this morning.  He is slower to respond to questions and is not fully oriented to time.  He did refuse his lactulose  last night and this morning but is agreeable to take it now.  OBJECTIVE:  Vital Signs: Vitals:   03/07/24 0414 03/07/24 0415 03/07/24 0842 03/07/24 1149  BP: 120/76  125/74 113/69  Pulse: 85  92 80  Resp: 18  18   Temp: 98.4 F (36.9 C)  97.8 F (36.6 C) (!) 97.5 F (36.4 C)  TempSrc:      SpO2: 97%  100% 99%  Weight:  84.1 kg    Height:       Supplemental O2: Room Air SpO2: 99 %  Filed Weights   03/05/24 0500 03/06/24 0426 03/07/24 0415  Weight: 83.5 kg 83.2 kg 84.1 kg     Intake/Output Summary (Last 24 hours) at 03/07/2024 1224 Last data filed at 03/07/2024 1032 Gross per 24 hour  Intake 120 ml  Output 1250 ml  Net -1130 ml   Net IO Since Admission: -7,699 mL [03/07/24 1224]  Physical Exam: Constitutional: Chronically ill appearing male laying in bed. In no acute distress. Cardio:Regular rate and rhythm.  Pulm: Normal work of breathing on room air Abdomen: Soft, nontender, distended, positive bowel sounds MSK: Trace lower extremity edema bilaterally, chronically dry, warm skin to BLE Neuro:Alert and oriented to person place, situation but not time.  Asterisks is present  Pertinent labs and imaging:      Latest Ref Rng & Units 03/06/2024    9:09 AM 03/04/2024    2:02 AM 03/03/2024     9:02 AM  CBC  WBC 4.0 - 10.5 K/uL 8.3  9.2  7.4   Hemoglobin 13.0 - 17.0 g/dL 8.9  8.9  8.8   Hematocrit 39.0 - 52.0 % 29.3  28.4  27.9   Platelets 150 - 400 K/uL 169  165  134        Latest Ref Rng & Units 03/06/2024    9:09 AM 03/04/2024    2:02 AM 03/03/2024    9:02 AM  CMP  Glucose 70 - 99 mg/dL 800  817  858   BUN 8 - 23 mg/dL 20  24  25    Creatinine 0.61 - 1.24 mg/dL 8.34  8.41  8.50   Sodium 135 - 145 mmol/L 133  134  135   Potassium 3.5 - 5.1 mmol/L 4.2  4.7  3.9   Chloride 98 - 111 mmol/L 100  101  100   CO2 22 - 32 mmol/L 23  24  26    Calcium  8.9 - 10.3 mg/dL 8.7  8.5  8.6     No results found.    ASSESSMENT/PLAN:  Assessment: Principal Problem:   Cirrhosis of liver with ascites (HCC) Active Problems:   Myasthenia gravis (HCC)   DM (diabetes mellitus) (HCC)   Hypertension   Esophageal varices without bleeding (HCC)   GAVE (gastric antral vascular ectasia)   Portal hypertensive gastropathy (HCC)   Ascites  Hypervolemia   (HFpEF) heart failure with preserved ejection fraction (HCC)   Atrial fibrillation (HCC)   CKD (chronic kidney disease) stage 3, GFR 30-59 ml/min (HCC)   Hepatic encephalopathy (HCC)   Acute on chronic congestive heart failure (HCC)  Bob Warren is a 77y.o. 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  Plan: #Decompensated cirrhosis with new hepatic encephalopathy #MASH #History of esophageal varices Slightly encephalopathic this morning after missing 2 doses of his lactulose .  Does appear that at the increased dose he responds well if he takes it.  Continues to have significant ascites but no shortness of breath, abdominal pain, or other worrisome symptoms. Overall pending home with hospice  after peritoneal drain placement scheduled for Monday.  N.p.o. at midnight - Continue Lasix  40 mg twice daily and spironolactone  50 mg daily - Continue lactulose  30 g to 4 times daily, can use enema if needed  #HFpEF (60-65%) Volume status significantly impacted by decompensated cirrhosis as above.  - Diuresis as above  #T2DM, controlled Last A1c 6.5% in June. -Semglee  15 units daily -Very Sensitive SSI and 4 units aspart 3 times daily with meals  #HTN #HLD Continues to be normotensive with the addition of 50 mg spironolactone . - Lasix  and spironolactone  as above - Continue diltiazem  120 mg daily  #CKD Stage 3 Creatinine stable, 1.58, around his baseline.   #Gastric Antral Vascular Ectasia (GAVE) #Iron Deficiency Anemia Hemoglobin 8.9  yesterday with no signs of active bleeding.  #Myasthenia Gravis Stable without acute flares off medication.  #Chronic back pain #Chronic right hip pain Chronic. CT hip in 2024 showed severe right hip degenerative changes. PTA on tramadol  50mg  usually taken once to twice a day as needed.  - Voltaren  gel, heating pad - Lidocaine  patch for further pain management  - Home Tramadol  50mg  BID PRN  #Hyponatremia, resolved #Hypokalemia, resolved  Best Practice: Diet: Cardiac diet IVF: Fluids: none VTE: enoxaparin  (LOVENOX ) injection 40 mg Start: 02/25/24 1100 Code: Full  Disposition planning: Therapy Recs: Pending,  Family Contact: stepdaughter DISPO: Anticipated discharge 1 days pending peritoneal drain placement.  Signature: Fairy Pool, DO Internal Medicine Resident, PGY-3 Please contact the on call pager at [phone] for any urgent or emergent needs. 12:24 PM 03/07/2024

## 2024-03-07 NOTE — Plan of Care (Signed)

## 2024-03-07 NOTE — Consult Note (Signed)
 Chief Complaint: Patient was seen in consultation today for recurrent malignant ascites, with consideration for abdominal PleurX catheter placement.  Referring Provider(s): Dr. Elsie Savannah, MD   Supervising Physician: Philip Cornet  Patient Status: [name] - In-pt  History of Present Illness: Bob Warren is a 78 y.o. 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  Patient is know to IR service, having most recently undergone paracentesis on 7/30 by Ms. Omohundro, NP.  Per Dr. [name] progress note on 8/9: [Patient] presented with abdominal distention, bilateral leg swelling, and shortness of breath and admitted for volume overload due to decompensated cirrhosis. Now complicated by hepatic encephalopathy, with plans to discharge home with hospice pending peritoneal drain placement.  Interventional Radiology is asked to evaluate for abdominal PleurX catheter placement. Request was reviewed and approved by Dr. Vanice. Patient is scheduled for procedure in IR tomorrow.    Patient currently has DNR order in place. Discussion with the patient and family regarding wishes.  The DNR order is rescinded during the procedure and the patient consents to the use of any resuscitation procedure needed to treat the clinical events that occur.    Past Medical History:  Diagnosis Date   Arthritis    Diabetes (HCC)    High cholesterol    Hypertension    Kidney stone    Myasthenia gravis [facility])     Past Surgical History:  Procedure Laterality Date   COLONOSCOPY  01/09/2021   Dr.Raghid Donnise [facility]. Poor prep.   ESOPHAGOGASTRODUODENOSCOPY N/A 01/14/2018   Procedure: ESOPHAGOGASTRODUODENOSCOPY (EGD);  Surgeon: Janalyn Keene NOVAK, MD;  Location: [facility] ENDOSCOPY;  Service: Endoscopy;  Laterality: N/A;   ESOPHAGOGASTRODUODENOSCOPY  01/09/2021   Dr.Raghid Donnise, [facility]. Hiatal Hernia. Findings concerning for nodular Barrett's disease. Biopsied. 2cm submucosal gastric body mass. Reosive gastritis, Biopsied.   ESOPHAGOGASTRODUODENOSCOPY N/A 01/16/2023   Procedure: ESOPHAGOGASTRODUODENOSCOPY (EGD);  Surgeon: Federico Rosario BROCKS, MD;  Location: [facility];  Service: Gastroenterology;  Laterality: N/A;   ESOPHAGOGASTRODUODENOSCOPY (EGD) WITH PROPOFOL  N/A 06/10/2022   Procedure: ESOPHAGOGASTRODUODENOSCOPY (EGD) WITH PROPOFOL ;  Surgeon: San Sandor GAILS, DO;  Location: [facility] ENDOSCOPY;  Service: Gastroenterology;  Laterality: N/A;   ESOPHAGOGASTRODUODENOSCOPY (EGD) WITH PROPOFOL  N/A 02/20/2023   Procedure: ESOPHAGOGASTRODUODENOSCOPY (EGD) WITH PROPOFOL ;  Surgeon: San Sandor GAILS, DO;  Location: [facility] ENDOSCOPY;  Service: Gastroenterology;  Laterality: N/A;   GASTRIC VARICES BANDING  01/16/2023   Procedure: GASTRIC GAVE BANDING;  Surgeon: Federico Rosario BROCKS, MD;  Location: [facility];  Service: Gastroenterology;;   GASTRIC VARICES BANDING  02/20/2023   Procedure: GASTRIC VARICES BANDING;  Surgeon: San Sandor GAILS, DO;  Location: [facility] ENDOSCOPY;  Service: Gastroenterology;;   HOT HEMOSTASIS N/A 06/10/2022   Procedure: HOT HEMOSTASIS (ARGON PLASMA COAGULATION/BICAP);  Surgeon: San Sandor GAILS, DO;  Location: [facility] ENDOSCOPY;  Service: Gastroenterology;  Laterality: N/A;   IR PARACENTESIS  02/25/2024   SKIN CANCER EXCISION Right 2016   Arm    Allergies: Imuran  [azathioprine ] and Zestril [lisinopril]  Medications: Prior to Admission medications   Medication Sig Start Date End Date Taking? Authorizing Provider  atorvastatin  (LIPITOR) 20 MG tablet Take 20 mg by mouth daily. 01/03/20  Yes [provider]  Cholecalciferol  (VITAMIN D3) 125 MCG (5000 UT) TABS Take 5,000 Units by mouth 2 (two) times daily.   Yes [provider]  diltiazem  (TIAZAC ) 120 MG 24 hr capsule Take 120 mg by mouth daily.   Yes [provider]  ferrous sulfate  325 (65 FE) MG EC  tablet Take 325 mg by mouth daily.   Yes [provider]  furosemide  (LASIX ) 40 MG tablet Take 1 tablet (40 mg total) by mouth 2 (two) times daily. 01/02/24  Yes Fairy Frames, MD  insulin  aspart (NOVOLOG ) 100 UNIT/ML injection Inject 4-6 Units into the skin See admin instructions. Inject 4-6 units into the skin three times daily, after meals. 01/28/23 04/03/24 Yes [provider]  insulin  glargine (LANTUS ) 100 UNIT/ML injection Inject 0.14 mLs (14 Units total) into the skin at bedtime. Patient taking differently: Inject 30-40 Units into the skin at bedtime. 02/03/18  Yes Danton Reyes DASEN, MD  losartan  (COZAAR ) 50 MG tablet Take 25 mg by mouth daily.   Yes [provider]  metFORMIN (GLUCOPHAGE) 850 MG tablet Take 850 mg by mouth 2 (two) times daily.   Yes [provider]  Omega-3 Fatty Acids (FISH OIL PO) Take 1 capsule by mouth in the morning.   Yes [provider]  omeprazole (PRILOSEC) 40 MG capsule Take 40 mg by mouth at bedtime.   Yes [provider]  potassium chloride  SA (KLOR-CON  M) 20 MEQ tablet 20 mEq See admin instructions. Disperse 1 tablet ( ) into 4 ounces of water and drink, once daily.   Yes [provider]  Probiotic Product (PROBIOTIC PO) Take 1 capsule by mouth daily.   Yes [provider]  traMADol  (ULTRAM ) 50 MG tablet Take 1 tablet (50 mg total) by mouth every 6 (six) hours as needed for moderate pain. Patient taking differently: Take 50 mg by mouth 2 (two) times daily as needed (pain). 02/03/18  Yes Danton Reyes DASEN, MD  gabapentin (NEURONTIN) 100 MG capsule Take 100 mg by mouth 2 (two) times daily. Patient not taking: Reported on 02/25/2024    [provider]  spironolactone  (ALDACTONE ) 25 MG tablet Take 1 tablet (25 mg total) by mouth daily. Patient not taking: Reported on 02/25/2024 01/02/24   Fairy Frames, MD     Family History  Problem Relation Age of Onset   Breast cancer Mother     Other Father        Gunshot wound   Diabetes Maternal Grandfather    Heart disease Maternal Grandfather    Colon cancer Neg Hx    Esophageal cancer Neg Hx    Stomach cancer Neg Hx     Social History   Socioeconomic History   Marital status: Married    Spouse name: Not on file   Number of children: 2   Years of education: 12   Highest education level: Not on file  Occupational History   Occupation: Retired  Tobacco Use   Smoking status: Former   Smokeless tobacco: Never   Tobacco comments:    Quit 25 yrs ago  Advertising account planner   Vaping status: Never Used  Substance and Sexual Activity   Alcohol use: No    Comment: Quit 25 yrs ago   Drug use: No   Sexual activity: Not on file  Other Topics Concern   Not on file  Social History Narrative   Lives at home w/ his wife   Right-handed   Caffeine: 3-6 cups of coffee per day   Social Drivers of Health   Financial Resource Strain: Medium Risk (12/24/2023)   [facility]   Overall Financial Resource Strain (CARDIA)    Difficulty of Paying Living Expenses: Somewhat hard  Food Insecurity: No Food Insecurity (  02/25/2024)   Hunger Vital Sign    Worried About Running Out of Food in the Last Year: Never true    Ran Out of Food in the Last Year: Never true  Transportation Needs: No Transportation Needs (02/25/2024)   PRAPARE - Administrator, Civil Service (Medical): No    Lack of Transportation (Non-Medical): No  Recent Concern: Transportation Needs - Unmet Transportation Needs (12/24/2023)   [facility]   [facility] - Transportation    Lack of Transportation (Medical): Yes    Lack of Transportation (Non-Medical): Yes  Physical Activity: Sufficiently Active (11/27/2022)   Received from [facility]   Exercise Vital Sign    On average, how many days per week do you engage in moderate to strenuous exercise (like a brisk walk)?: 7 days    On average, how many minutes do you engage in exercise  at this level?: 30 min  Stress: No Stress Concern Present (11/27/2022)   Received from [facility] - Occupational Stress Questionnaire    Feeling of Stress : Only a little  Social Connections: Moderately Integrated (02/25/2024)   Social Connection and Isolation Panel    Frequency of Communication with Friends and Family: More than three times a week    Frequency of Social Gatherings with Friends and Family: Once a week    Attends Religious Services: 1 to 4 times per year    Active Member of [employer] or Organizations: No    Attends Engineer, structural: Never    Marital Status: Married    Vital Signs: BP 125/74 (BP Location: Left Arm)   Pulse 92   Temp 97.8 F (36.6 C)   Resp 18   Ht 5' 7 (1.702 m)   Wt 185 lb 6.4 oz (84.1 kg)   SpO2 100%   BMI 29.04 kg/m   Advance Care Plan: The advanced care place/surrogate decision maker was discussed at the time of visit and the patient did not wish to discuss or was not able to name a surrogate decision maker or provide an advance care plan.  Physical Exam Constitutional:      Appearance: Normal appearance.  HENT:     Head: Normocephalic and atraumatic.  Cardiovascular:     Rate and Rhythm: Normal rate.  Pulmonary:     Effort: Pulmonary effort is normal. No respiratory distress.  Abdominal:     General: There is distension.     Tenderness: There is no abdominal tenderness.  Skin:    General: Skin is warm and dry.  Neurological:     General: No focal deficit present.     Mental Status: He is alert.     Imaging: DG CHEST PORT 1 VIEW Result Date: 03/01/2024 CLINICAL DATA:  Shortness of breath EXAM: PORTABLE CHEST 1 VIEW COMPARISON:  02/24/2024 FINDINGS: Heart and mediastinal contours are within normal limits. No focal opacities or effusions. No acute bony abnormality. Aortic atherosclerosis. IMPRESSION: No active cardiopulmonary disease. Electronically Signed   By: Franky Crease M.D.    On: 03/01/2024 12:18   IR Paracentesis Result Date: 02/25/2024 INDICATION: 78 year old male. History of CHF with recurrent ascites. Request for therapeutic paracentesis EXAM: ULTRASOUND GUIDED THERAPEUTIC LEFT-SIDED PARACENTESIS MEDICATIONS: Lidocaine  1% 10 mL COMPLICATIONS: None immediate. PROCEDURE: Informed written consent was obtained from the patient after a discussion of the risks, benefits and alternatives to treatment. A timeout was performed prior to the initiation of the procedure. Initial  ultrasound scanning demonstrates a large amount of ascites within the right lower abdominal quadrant. The right lower abdomen was prepped and draped in the usual sterile fashion. 1% lidocaine  was used for local anesthesia. Following this, a 19 gauge, 7-cm, Yueh catheter was introduced. An ultrasound image was saved for documentation purposes. The paracentesis was performed. The catheter was removed and a dressing was applied. The patient tolerated the procedure well without immediate post procedural complication. Patient received post-procedure intravenous albumin ; see nursing notes for details. FINDINGS: A total of approximately 9.4 L of straw-colored fluid was removed. IMPRESSION: Successful ultrasound-guided paracentesis yielding 9.4 liters of peritoneal fluid. Performed by Delon Beagle NP Electronically Signed   By: Ester Sides M.D.   On: 02/25/2024 16:07   US  ASCITES (ABDOMEN LIMITED) Result Date: 02/25/2024 CLINICAL DATA:  78 year old male with ascites, heart failure. Status post ultrasound-guided paracentesis on 12/28/2023. EXAM: LIMITED ABDOMEN ULTRASOUND FOR ASCITES TECHNIQUE: Limited ultrasound survey for ascites was performed in all four abdominal quadrants. COMPARISON:  12/28/2023 ultrasound. FINDINGS: Grayscale images of the 4 quadrants demonstrate large volume recurrent ascites with simple appearing fluid density (image 11). Volume appears similar to that prior to the June paracentesis.  IMPRESSION: Recurrent ascites since June paracentesis, Large volume of similar to prior. Electronically Signed   By: VEAR Hurst M.D.   On: 02/25/2024 12:44   DG Chest 2 View Result Date: 02/24/2024 CLINICAL DATA:  Dyspnea and swelling. Abdominal distension for couple of days. 20 pound weight gain over the last week. History of CHF. EXAM: CHEST - 2 VIEW COMPARISON:  12/27/2023 FINDINGS: Shallow inspiration. Heart size and pulmonary vascularity are normal. Linear atelectasis suggested in the lung bases. No airspace disease or consolidation. No pleural effusion or pneumothorax. Mediastinal contours appear intact. Degenerative changes in the shoulders. Calcification of the aorta. IMPRESSION: Shallow inspiration with linear atelectasis in the lung bases. No focal consolidation. Electronically Signed   By: Elsie Gravely M.D.   On: 02/24/2024 16:28    Labs:  CBC: Recent Labs    03/02/24 0328 03/03/24 0902 03/04/24 0202 03/06/24 0909  WBC 6.5 7.4 9.2 8.3  HGB 8.8* 8.8* 8.9* 8.9*  HCT 28.4* 27.9* 28.4* 29.3*  PLT 148* 134* 165 169    COAGS: Recent Labs    12/28/23 1655 12/31/23 0226 02/25/24 1204  INR 1.2 1.2 1.2    BMP: Recent Labs    03/02/24 0328 03/03/24 0902 03/04/24 0202 03/06/24 0909  NA 134* 135 134* 133*  K 3.3* 3.9 4.7 4.2  CL 99 100 101 100  CO2 26 26 24 23   GLUCOSE 285* 141* 182* 199*  BUN 29* 25* 24* 20  CALCIUM  8.5* 8.6* 8.5* 8.7*  CREATININE 1.58* 1.49* 1.58* 1.65*  GFRNONAA 45* 48* 45* 43*    LIVER FUNCTION TESTS: Recent Labs    12/31/23 0226 02/24/24 1512 02/26/24 0156 02/27/24 0155 02/28/24 0239  BILITOT 0.6 1.1 0.8 0.7  --   AST 50* 30 16 14*  --   ALT 36 20 13 12   --   ALKPHOS 41 87 52 50  --   PROT 5.5* 7.2 5.6* 5.1*  --   ALBUMIN  2.1* 2.2* 2.7* 2.2* 2.4*    TUMOR MARKERS: No results for input(s): AFPTM, CEA, CA199, CHROMGRNA in the last 8760 hours.  Assessment and Plan:  Abdominal distention due to decompensated cirrhosis. Now  complicated by hepatic encephalopathy, with plans to discharge home with hospice pending peritoneal drain placement.  Patient will present for tentatively scheduled abdominal PleurX catheter  placement in IR tomorrow.  Patient will be NPO at midnight.  All labs and medications are within acceptable parameters.  No pertinent allergies.   Risks and benefits  of peritoneal catheter discussed with the patient including bleeding, infection, damage to adjacent structures, malfunction of the catheter with need for additional procedures.  All of the patient's daughter's questions were answered, patient's daughter is agreeable to proceed.  Consent signed and in chart.   Patient currently has DNR order in place. Discussion with the patient and family regarding wishes.  The DNR order is rescinded during the procedure and the patient consents to the use of any resuscitation procedure needed to treat the clinical events that occur.  Thank you for allowing our service to participate in Bob Warren 's care.  Electronically Signed:   SARI GORMAN LAMP PA-C 03/07/2024 12:31 PM      I spent a total of 40 Minutes  in face to face in clinical consultation, greater than 50% of which was counseling/coordinating care for recurrent malignant ascites, with consideration for abdominal PleurX catheter placement.

## 2024-03-08 ENCOUNTER — Encounter (HOSPITAL_COMMUNITY): Payer: Self-pay | Admitting: Internal Medicine

## 2024-03-08 ENCOUNTER — Inpatient Hospital Stay (HOSPITAL_COMMUNITY)

## 2024-03-08 DIAGNOSIS — K3189 Other diseases of stomach and duodenum: Secondary | ICD-10-CM

## 2024-03-08 DIAGNOSIS — I5033 Acute on chronic diastolic (congestive) heart failure: Secondary | ICD-10-CM

## 2024-03-08 DIAGNOSIS — K766 Portal hypertension: Secondary | ICD-10-CM

## 2024-03-08 HISTORY — PX: IR PERC TUN PERIT CATH WO PORT S&I /IMAG: IMG2327

## 2024-03-08 LAB — GLUCOSE, CAPILLARY
Glucose-Capillary: 146 mg/dL — ABNORMAL HIGH (ref 70–99)
Glucose-Capillary: 176 mg/dL — ABNORMAL HIGH (ref 70–99)

## 2024-03-08 MED ORDER — CHLORHEXIDINE GLUCONATE CLOTH 2 % EX PADS
6.0000 | MEDICATED_PAD | Freq: Every day | CUTANEOUS | Status: DC
  Administered 2024-03-08 (×2): 6 via TOPICAL

## 2024-03-08 MED ORDER — LACTULOSE 10 GM/15ML PO SOLN: 30.0000 g | Freq: Four times a day (QID) | ORAL | Status: DC

## 2024-03-08 MED ORDER — DICLOFENAC SODIUM 1 % EX GEL: 2.0000 g | Freq: Four times a day (QID) | CUTANEOUS | Status: DC | PRN

## 2024-03-08 MED ORDER — LACTULOSE 10 GM/15ML PO SOLN
30.0000 g | ORAL | Status: DC
  Administered 2024-03-08 (×2): 30 g via ORAL
  Filled 2024-03-08 (×2): qty 45

## 2024-03-08 MED ORDER — MIDAZOLAM HCL 2 MG/2ML IJ SOLN
INTRAMUSCULAR | Status: AC
Start: 2024-03-08 — End: 2024-03-08
  Filled 2024-03-08: qty 2

## 2024-03-08 MED ORDER — POLYETHYLENE GLYCOL 3350 17 G PO PACK: 17.0000 g | PACK | Freq: Every day | ORAL | 0 refills | Status: DC | PRN

## 2024-03-08 MED ORDER — FENTANYL CITRATE (PF) 100 MCG/2ML IJ SOLN
INTRAMUSCULAR | Status: AC
  Filled 2024-03-08: qty 2

## 2024-03-08 MED ORDER — LIDOCAINE 5 % EX PTCH: 1.0000 | MEDICATED_PATCH | Freq: Every day | CUTANEOUS | 0 refills | Status: DC | PRN

## 2024-03-08 MED ORDER — LIDOCAINE-EPINEPHRINE 1 %-1:100000 IJ SOLN
INTRAMUSCULAR | Status: AC
  Filled 2024-03-08: qty 1

## 2024-03-08 MED ORDER — CEFAZOLIN SODIUM-DEXTROSE 2-4 GM/100ML-% IV SOLN
INTRAVENOUS | Status: AC
  Filled 2024-03-08: qty 100

## 2024-03-08 MED ORDER — SPIRONOLACTONE 50 MG PO TABS: 50.0000 mg | ORAL_TABLET | Freq: Every day | ORAL | Status: DC

## 2024-03-08 MED ORDER — FENTANYL CITRATE (PF) 100 MCG/2ML IJ SOLN
INTRAMUSCULAR | Status: AC | PRN
  Administered 2024-03-08 (×2): 50 ug via INTRAVENOUS

## 2024-03-08 MED ORDER — SENNOSIDES-DOCUSATE SODIUM 8.6-50 MG PO TABS: 1.0000 | ORAL_TABLET | Freq: Every evening | ORAL | Status: DC | PRN

## 2024-03-08 MED ORDER — LACTULOSE 10 GM/15ML PO SOLN: 30.0000 g | Freq: Three times a day (TID) | ORAL | Status: DC

## 2024-03-08 MED ORDER — INSULIN GLARGINE 100 UNIT/ML ~~LOC~~ SOLN: 15.0000 [IU] | Freq: Every day | SUBCUTANEOUS | 0 refills | Status: DC

## 2024-03-08 MED ORDER — LOPERAMIDE HCL 2 MG PO CAPS
4.0000 mg | ORAL_CAPSULE | Freq: Once | ORAL | Status: AC
  Administered 2024-03-08 (×2): 4 mg via ORAL
  Filled 2024-03-08: qty 2

## 2024-03-08 MED ORDER — INSULIN GLARGINE 100 UNIT/ML ~~LOC~~ SOLN: 15.0000 [IU] | Freq: Every day | SUBCUTANEOUS | Status: DC

## 2024-03-08 MED ORDER — LIDOCAINE 5 % EX PTCH: 1.0000 | MEDICATED_PATCH | Freq: Every day | CUTANEOUS | Status: DC | PRN

## 2024-03-08 MED ORDER — LIDOCAINE-EPINEPHRINE 1 %-1:100000 IJ SOLN
20.0000 mL | Freq: Once | INTRAMUSCULAR | Status: AC
  Administered 2024-03-08 (×2): 10 mL

## 2024-03-08 MED ORDER — SPIRONOLACTONE 50 MG PO TABS: 50.0000 mg | ORAL_TABLET | Freq: Every day | ORAL | 0 refills | Status: DC

## 2024-03-08 MED ORDER — MIDAZOLAM HCL 2 MG/2ML IJ SOLN
INTRAMUSCULAR | Status: AC | PRN
Start: 2024-03-08 — End: 2024-03-08
  Administered 2024-03-08 (×4): 1 mg via INTRAVENOUS

## 2024-03-08 MED ORDER — DICLOFENAC SODIUM 1 % EX GEL: 2.0000 g | Freq: Four times a day (QID) | CUTANEOUS | 0 refills | Status: DC | PRN

## 2024-03-08 MED ORDER — LACTULOSE 10 GM/15ML PO SOLN: 30.0000 g | Freq: Three times a day (TID) | ORAL | 0 refills | Status: DC

## 2024-03-08 MED ORDER — CEFAZOLIN SODIUM-DEXTROSE 2-4 GM/100ML-% IV SOLN
INTRAVENOUS | Status: AC | PRN
  Administered 2024-03-08 (×2): 2 g via INTRAVENOUS

## 2024-03-08 MED ORDER — POLYETHYLENE GLYCOL 3350 17 G PO PACK: 17.0000 g | PACK | Freq: Every day | ORAL | Status: DC | PRN

## 2024-03-08 MED ORDER — SENNOSIDES-DOCUSATE SODIUM 8.6-50 MG PO TABS: 1.0000 | ORAL_TABLET | Freq: Every evening | ORAL | 0 refills | Status: DC | PRN

## 2024-03-08 MED ORDER — LACTULOSE 10 GM/15ML PO SOLN: 30.0000 g | Freq: Four times a day (QID) | ORAL | 0 refills | Status: DC

## 2024-03-08 NOTE — Discharge Summary (Signed)
 Name: Bob Warren MRN: [id number] DOB: 08-Sep-1945 78 y.o. PCP: Bob Mix, MD  Date of Admission: 02/24/2024  2:03 PM Date of Discharge: 03/08/2024  Attending Physician: Dr. MICAEL Riis Warren  Discharge Diagnosis: Principal Problem:   Cirrhosis of liver with ascites (HCC) Active Problems:   Myasthenia gravis (HCC)   DM (diabetes mellitus) (HCC)   Hypertension   Esophageal varices without bleeding (HCC)   GAVE (gastric antral vascular ectasia)   Portal hypertensive gastropathy (HCC)   Ascites   Hypervolemia   (HFpEF) heart failure with preserved ejection fraction (HCC)   Atrial fibrillation (HCC)   CKD (chronic kidney disease) stage 3, GFR 30-59 ml/min (HCC)   Hepatic encephalopathy (HCC)   Acute on chronic congestive heart failure (HCC) Hyperlipidemia Iron Deficiency Anemia Chronic back pain Chronic right hip pain Hyponatremia, resolved Hypokalemia, resolved  Discharge Medications: Allergies as of 03/08/2024       Reactions   Imuran  [azathioprine ] Other (See Comments)   Abnormal LIver functional    Zestril [lisinopril] Cough        Medication List     STOP taking these medications    atorvastatin  20 MG tablet Commonly known as: LIPITOR   gabapentin 100 MG capsule Commonly known as: NEURONTIN   losartan  50 MG tablet Commonly known as: COZAAR    metFORMIN 850 MG tablet Commonly known as: GLUCOPHAGE   potassium chloride  SA 20 MEQ tablet Commonly known as: KLOR-CON  M       TAKE these medications    diclofenac  Sodium 1 % Gel Commonly known as: VOLTAREN  Apply 2 g topically 4 (four) times daily as needed (pain).   diltiazem  120 MG 24 hr capsule Commonly known as: TIAZAC  Take 120 mg by mouth daily.   ferrous sulfate  325 (65 FE) MG EC tablet Take 325 mg by mouth daily.   FISH OIL PO Take 1 capsule by mouth in the morning.   furosemide  40 MG tablet Commonly known as: LASIX  Take 1 tablet (40 mg total) by mouth 2 (two) times daily.    insulin  aspart 100 UNIT/ML injection Commonly known as: novoLOG  Inject 4-6 Units into the skin See admin instructions. Inject 4-6 units into the skin three times daily, after meals.   insulin  glargine 100 UNIT/ML injection Commonly known as: LANTUS  Inject 0.15 mLs (15 Units total) into the skin at bedtime. What changed: how much to take   lactulose  10 GM/15ML solution Commonly known as: CHRONULAC  Take 45 mLs (30 g total) by mouth 4 (four) times daily.   lidocaine  5 % Commonly known as: LIDODERM  Place 1 patch onto the skin daily as needed (pain). Remove & Discard patch within 12 hours or as directed by MD   omeprazole 40 MG capsule Commonly known as: PRILOSEC Take 40 mg by mouth at bedtime.   polyethylene glycol 17 g packet Commonly known as: MIRALAX  / GLYCOLAX  Take 17 g by mouth daily as needed for moderate constipation.   PROBIOTIC PO Take 1 capsule by mouth daily.   senna-docusate 8.6-50 MG tablet Commonly known as: Senokot-S Take 1 tablet by mouth at bedtime as needed for mild constipation.   spironolactone  50 MG tablet Commonly known as: ALDACTONE  Take 1 tablet (50 mg total) by mouth daily. What changed:  medication strength how much to take   traMADol  50 MG tablet Commonly known as: ULTRAM  Take 1 tablet (50 mg total) by mouth every 6 (six) hours as needed for moderate pain. What changed:  when to take this reasons to take this  Vitamin D3 125 MCG (5000 UT) Tabs Take 5,000 Units by mouth 2 (two) times daily.        Disposition and follow-up:   Bob Warren was discharged from [facility] in Stable condition.  At the hospital follow up visit please address:  1.  Follow-up:  a. Results from [place] [facility] appointment on 03/25/24 for recurrent ascites. Additionally, please ensure his lactulose  dosing is still appropriate at 4 times day and the patient's encephalopathy has not worsened.       Follow-up Appointments:  Follow-up  Information     Bob Alan SAUNDERS, PA-C. Go on 03/25/2024.   Specialty: Gastroenterology Why: at 2:30 PM, please arrive 15-30 minutes early Contact information: [address] [place] [address] [phone]         Bob Mix, MD. Schedule an appointment as soon as possible for a visit.   Specialty: Internal Medicine Contact information: [address] [place] [address] [phone]         [place] up.   Why: [facility] [place] will provide home hospice services. Contact information: [address] [place] [address] [phone]                 Hospital Course by problem list: Bob Warren is a 78 y.o. 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  #Volume overload #Decompensated cirrhosis suspect from [facility] #Hx of Esophageal varices #HFpEF (60-65%) Presented with recurrent ascites and bilateral LE edema.  Was admitted and May/June for ascites and HFpEF.  Received paracentesis during last admission, no signs of infection or malignant cells.  No signs of bleeding during this hospitalization, but reported dark stools 2 days prior to his admission. Labs notable for hyponatremia of 129, BNP 179, leukocytosis of 11.7, stable kidney function and normocytic anemia, normal platelet count, negative troponin x2. CXR appears relatively unremarkable. EKG with prolonged PR interval, SR Renal function roughly the same compared to at time of discharge from June.  Ultrasound abdomen showed large volume ascites.  Exam was nontender, afebrile so was less suspicious for SBP. A&O x 3 without current signs for encephalopathy. Weight at last discharge was 88.8kg on 6/6. Weight on admission here 98 kg. His hypervolemia 2/2 decompensated  cirrhosis and a history of HFpEF, with most recent TTE in June without change. No history of HE noted on chart review. Underwent paracentesis with removal of 9.4L. Received albumin . UOP and weight following IV Lasix  and paracentesis improved. Refractory ascites discussed with GI and consideration of TIPS. Not recommending TIPS at this time since the patient is not currently requiring frequent (1-2x/month) therapeutic paracentesis despite diuretics. Discussed with patient's step-daughter/primary caregiver the need for more regular therapeutic paracentesis every 6 weeks to avoid large volumes being removed at once, which can further stress the kidneys.  The patient and his family opted to consult palliative care regarding the patient's options for continued treatment or hospice care.  After a difficult and thoughtful discussion, the patient and his family will discharge to [facility]. IR was able to place a PleurX catheter and perform another therapeutic paracentesis for recurrent ascites, providing relief. He was discharged on Lasix  40 mg twice daily and spironolactone  50 mg daily with GI follow-up scheduled.  Recommended to repeat  metabolic panel in 2 weeks.  #Altered Mental Status Patient became altered 8/2 with decreased attention, delayed responses, and asterixis on exam. High suspicion for hepatic encephalopathy. Lactulose  20g Q2H started until BM.  Patient was able to achieve this goal for 1 day, then was placed on lactulose  10 g 3 times daily.  He sustained this goal for another day before he needed to be uptitrated back to lactulose  20 g 3 times daily.  After not being able to meet his goal, he was uptitrated to 30 g 4 times a day.  His bowel movements were closely monitored.   #Hyponatremia  Arrived hyponatremic at 129 due to volume overload which improved after diuresis.  Sodium level at discharge is 135.  #CKD 3 Serum creatinine remained at baseline during admission.  1.49 on  discharge.   #T2DM, controlled Last A1c 6.5 in June.  Noted at last hospitalization metformin was discontinued from home med list. Blood sugars were high here from 250-350. Added Semglee  15 units daily, and monitored for CBG  > 180. Added 4 units of meal time insulin  to cover elevated CBGs. CBG on discharge was 146.    #GAVE #IDA Hemoglobin on admission 9.9 which is improved from discharge in June.  No active bleeding during admission.  8.9 on discharge.   #Myasthenia gravis Chronic.  No acute flares.  Not currently on therapy.   #HTN #HLD Remained largely normotensive even with the addition of spironolactone  and Lasix  as above.  Continued on home diltiazem .  Stopped atorvastatin  in the setting of decompensated cirrhosis.   #Chronic back pain #Chronic right hip pain Chronic. CT hip in 2024 showed severe right hip degenerative changes. PTA on tramadol  50 mg usually taken once to twice a day as needed. Voltaren  gel, heating pad given for symptomatic management. Added lidocaine  patch for chronic back pain.    Discharge Subjective: Patient laying comfortably in bed. States he slept alright overnight, but had some trouble getting comfortable due to his chronic back pain. He is ready to go home and understands he will have the PleurX drain placed today. He is ready to be discharged.  Discharge Exam:   BP 126/63   Pulse 85   Temp 97.6 F (36.4 C)   Resp 15   Ht 5' 7 (1.702 m)   Wt 84.1 kg   SpO2 99%   BMI 29.04 kg/m  Constitutional: chronically-ill appearing male laying in bed, in no acute distress HENT: normocephalic atraumatic, mucous membranes moist Eyes: conjunctiva non-erythematous Neck: supple Cardiovascular: regular rate and rhythm, no m/r/g Pulmonary/Chest: normal work of breathing on room air, lungs clear to auscultation bilaterally Abdominal: soft, non-tender, distended, positive bowel sounds Neurological: alert & oriented to person, place, time and situation. This has  been labile on his lactulose  dosing.  Skin: warm and dry Psych: normal mood and affect.   Pertinent Labs, Studies, and Procedures:     Latest Ref Rng & Units 03/06/2024    9:09 AM 03/04/2024    2:02 AM 03/03/2024    9:02 AM  CBC  WBC 4.0 - 10.5 K/uL 8.3  9.2  7.4   Hemoglobin 13.0 - 17.0 g/dL 8.9  8.9  8.8   Hematocrit 39.0 - 52.0 % 29.3  28.4  27.9   Platelets 150 - 400 K/uL 169  165  134        Latest Ref Rng & Units 03/06/2024    9:09 AM 03/04/2024    2:02 AM 03/03/2024    9:02 AM  CMP  Glucose 70 - 99 mg/dL 800  817  858   BUN 8 - 23 mg/dL 20  24  25    Creatinine 0.61 - 1.24 mg/dL 8.34  8.41  8.50   Sodium 135 - 145 mmol/L 133  134  135   Potassium 3.5 - 5.1 mmol/L 4.2  4.7  3.9   Chloride 98 - 111 mmol/L 100  101  100   CO2 22 - 32 mmol/L 23  24  26    Calcium  8.9 - 10.3 mg/dL 8.7  8.5  8.6     IR Paracentesis Result Date: 02/25/2024 INDICATION: 78 year old male. History of CHF with recurrent ascites. Request for therapeutic paracentesis EXAM: ULTRASOUND GUIDED THERAPEUTIC LEFT-SIDED PARACENTESIS MEDICATIONS: Lidocaine  1% 10 mL COMPLICATIONS: None immediate. PROCEDURE: Informed written consent was obtained from the patient after a discussion of the risks, benefits and alternatives to treatment. A timeout was performed prior to the initiation of the procedure. Initial ultrasound scanning demonstrates a large amount of ascites within the right lower abdominal quadrant. The right lower abdomen was prepped and draped in the usual sterile fashion. 1% lidocaine  was used for local anesthesia. Following this, a 19 gauge, 7-cm, Yueh catheter was introduced. An ultrasound image was saved for documentation purposes. The paracentesis was performed. The catheter was removed and a dressing was applied. The patient tolerated the procedure well without immediate post procedural complication. Patient received post-procedure intravenous albumin ; see nursing notes for details. FINDINGS: A total of  approximately 9.4 L of straw-colored fluid was removed. IMPRESSION: Successful ultrasound-guided paracentesis yielding 9.4 liters of peritoneal fluid. Performed by Delon Beagle NP Electronically Signed   By: Ester Sides M.D.   On: 02/25/2024 16:07   US  ASCITES (ABDOMEN LIMITED) Result Date: 02/25/2024 CLINICAL DATA:  78 year old male with ascites, heart failure. Status post ultrasound-guided paracentesis on 12/28/2023. EXAM: LIMITED ABDOMEN ULTRASOUND FOR ASCITES TECHNIQUE: Limited ultrasound survey for ascites was performed in all four abdominal quadrants. COMPARISON:  12/28/2023 ultrasound. FINDINGS: Grayscale images of the 4 quadrants demonstrate large volume recurrent ascites with simple appearing fluid density (image 11). Volume appears similar to that prior to the June paracentesis. IMPRESSION: Recurrent ascites since June paracentesis, Large volume of similar to prior. Electronically Signed   By: VEAR Hurst M.D.   On: 02/25/2024 12:44   DG Chest 2 View Result Date: 02/24/2024 CLINICAL DATA:  Dyspnea and swelling. Abdominal distension for couple of days. 20 pound weight gain over the last week. History of CHF. EXAM: CHEST - 2 VIEW COMPARISON:  12/27/2023 FINDINGS: Shallow inspiration. Heart size and pulmonary vascularity are normal. Linear atelectasis suggested in the lung bases. No airspace disease or consolidation. No pleural effusion or pneumothorax. Mediastinal contours appear intact. Degenerative changes in the shoulders. Calcification of the aorta. IMPRESSION: Shallow inspiration with linear atelectasis in the lung bases. No focal consolidation. Electronically Signed   By: Elsie Gravely M.D.   On: 02/24/2024 16:28     Discharge Instructions:   Discharge Instructions      Larence Thone,  You were recently admitted to [facility] for volume overload due to decompensated cirrhosis.  We treated you by drying fluid off of your abdomen and giving you diuretics to help you  remove more fluid through your kidneys.  I am glad that you are well enough to go home and we have made some changes to your medications that are included in this packet.  Please follow-up with your primary care doctor within the next week and with  gastroenterology on 03/25/2024 where they should be able to set up his paracentesis outpatient.  You should seek further medical care if you have any significant return of your symptoms or symptoms like dark or red bowel movements or bloody vomit.  Sincerely, Jakevious Hollister, DO     Signed: Curtis Cain, DO Internal Medicine Resident, PGY-1 03/08/2024, 11:29 AM   Please contact the on call pager after 5 pm and on weekends at [phone].

## 2024-03-08 NOTE — Progress Notes (Signed)
 Placed call to patient's daughter Joen Fuse [phone]). Daughter Joen in agreement to meet today at 1:30 PM today to complete paperwork/consents for discharge home with hospice care.   Jon Peal [facility] Liaison    direct:  [phone] fax:  [phone]     [place] [address] p:  [phone] Email: [email or web address]  [place] p:  [phone] Email: [email or web address]

## 2024-03-08 NOTE — Care Management Important Message (Signed)
 Important Message  Patient Details  Name: Bob Warren MRN: [id number] Date of Birth: November 14, 1945   Important Message Given:  Yes - Medicare IM     Claretta Deed 03/08/2024, 4:14 PM

## 2024-03-08 NOTE — TOC Transition Note (Addendum)
 Transition of Care [facility]) - Discharge Note   Patient Details  Name: Laakea Pereira MRN: [id number] Date of Birth: 1945-08-06  [facility] CM/SW Contact:  Rosaline JONELLE Joe, RN Phone Number: 03/08/2024, 12:55 PM   Clinical Narrative:    CM met with the patient at the bedside and the patient received Right sided Pleurex drain today.  Bedside nursing will send Pleurex drainage devices home with the patient  #5 and [facility] RN will provide supplies and care for the pleurex catheter in the home.  I called the daughter and hospital bed and bedside table have been delivered to the home.  The daughter is meeting Magdalena Berber, CM with [facility] at the bedside today at 1:30 to sign home hospice consent forms.  Daughter was also requesting condom cath versus foley catheter.  PTAR will be arranged for home when patient is ready to discharge home today.  PTAr is set up as will call.  Bedside nursing can call PTAR when patient is ready for transport to home.  DNR is located in the [place] packet.  03/08/24 1540 - Daughter is at the bedside with [facility] RN.  Daughter requests a foley placed prior to discharge.  MD requested to place order.  [facility] will be called for transport to home.  03/08/24 1551 - bedside nursing states that she plans to place foley once MD places order.  I called PT[facility] and requested transport to home.         Patient Goals and CMS Choice            Discharge Placement                       Discharge Plan and Services Additional resources added to the After Visit Summary for                                       Social Drivers of Health (SDOH) Interventions SDOH Screenings   Food Insecurity: No Food Insecurity (02/25/2024)  Housing: Low Risk  (02/25/2024)  Transportation Needs: No Transportation Needs (02/25/2024)  Recent Concern: Transportation Needs - Unmet Transportation Needs (12/24/2023)   Received from [facility]  Utilities: Not At Risk  (02/25/2024)  Financial Resource Strain: Medium Risk (12/24/2023)   Received from [facility]  Physical Activity: Sufficiently Active (11/27/2022)   Received from [facility]  Social Connections: Moderately Integrated (02/25/2024)  Stress: No Stress Concern Present (11/27/2022)   Received from [facility]  Tobacco Use: Medium Risk (03/08/2024)  Health Literacy: Low Risk  (11/27/2022)   Received from [facility]     Readmission Risk Interventions    03/04/2024    3:40 PM  Readmission Risk Prevention Plan  Transportation Screening Complete  PCP or Specialist Appt within 3-5 Days Complete  HRI or Home Care Consult Complete  Social Work Consult for Recovery Care Planning/Counseling Complete  Palliative Care Screening Complete  Medication Review [employer]) Complete

## 2024-03-08 NOTE — Progress Notes (Signed)
 PT Cancellation Note  Patient Details Name: Bob Warren MRN: [id number] DOB: 1945/07/30   Cancelled Treatment:    Reason Eval/Treat Not Completed: Patient at procedure or test/unavailable Pt is off floor for drain placement and is going home with hospice this afternoon. PT will follow back to provide family education on movement as schedule allows.   Seyed Heffley B. Fleeta Lapidus PT, DPT Acute Rehabilitation Services Please use secure chat or  Call Office [phone]   Almarie KATHEE Fleeta [facility] 03/08/2024, 10:21 AM

## 2024-03-08 NOTE — Progress Notes (Signed)
 Occupational Therapy Treatment Patient Details Name: Bob Warren MRN: [id number] DOB: 02/20/1946 Today's Date: 03/08/2024   History of present illness Pt is a 78 y/o male presenting with SOB, abdominal distension and BLE edema. Admitted for volume overload, decompensated cirrhosis and hyponatremia. Paracentesis 7/30 removing 9.4L. PMH: chronic HFpEF, myasthenia gravis, PAF, GAVE, chronic bilateral lower extremity edema, hypertension, hyperlipidemia, GERD, type 2 diabetes, cirrhosis d/t MASH   OT comments  Pt with slower progress towards OT goals due to progressive confusion today. Pt reporting need to use bathroom; assisted to [facility] using RW with Min A though unsuccessful BM. Once back to bed, pt noted to press call button asking for bathroom assistance- reoriented that pt had just transferred onto [facility] Noted plan for discharging home with hospice. Will need hands on assist for all mobility at home especially if confusion persists.       If plan is discharge home, recommend the following:  Assistance with cooking/housework;Assist for transportation;A lot of help with bathing/dressing/bathroom;A little help with walking and/or transfers   Equipment Recommendations  None recommended by OT    Recommendations for Other Services      Precautions / Restrictions Precautions Precautions: Fall Restrictions Weight Bearing Restrictions Per Provider Order: No       Mobility Bed Mobility Overal bed mobility: Needs Assistance Bed Mobility: Supine to Sit, Sit to Supine     Supine to sit: Mod assist Sit to supine: Min assist        Transfers Overall transfer level: Needs assistance Equipment used: Rolling walker (2 wheels) Transfers: Sit to/from Stand, Bed to chair/wheelchair/BSC Sit to Stand: Contact guard assist     Step pivot transfers: Min assist     General transfer comment: to/from [facility] with RW     Balance Overall balance assessment: Needs assistance Sitting-balance support:  No upper extremity supported, Feet supported Sitting balance-Leahy Scale: Fair     Standing balance support: Bilateral upper extremity supported, During functional activity, Single extremity supported Standing balance-Leahy Scale: Poor                             ADL either performed or assessed with clinical judgement   ADL Overall ADL's : Needs assistance/impaired                     Lower Body Dressing: Maximal assistance;Sitting/lateral leans Lower Body Dressing Details (indicate cue type and reason): for socks Toilet Transfer: Minimal assistance;Stand-pivot;BSC/3in1;Rolling walker (2 wheels) Toilet Transfer Details (indicate cue type and reason): pt reports likely unable to make it to walk to bathroom - assisted to [facility], Min A to control RW to/from [facility]. cues/assist for directions back to bed                Extremity/Trunk Assessment Upper Extremity Assessment Upper Extremity Assessment: Generalized weakness;Right hand dominant   Lower Extremity Assessment Lower Extremity Assessment: Defer to PT evaluation        Vision   Vision Assessment?: No apparent visual deficits   Perception     Praxis     Communication Communication Communication: No apparent difficulties   Cognition Arousal: Alert Behavior During Therapy: WFL for tasks assessed/performed, Impulsive Cognition: Cognition impaired   Orientation impairments: Situation, Time Awareness: Intellectual awareness impaired, Online awareness impaired Memory impairment (select all impairments): Short-term memory, Working [employer] fast streamer, Conservation officer, historic buildings Attention impairment (select first level of impairment): Sustained attention Executive functioning impairment (select all impairments): Sequencing, Reasoning, Problem solving,  Organization OT - Cognition Comments: pt more confused this AM compared to initial eval. Pt reporting unable to use call bell/tv remove with bandaid on hand and with  bedrail up- reading out MRN number x 2 during session and reported need to change the name on his band from his son's name. cues for sequencing and awareness. pt reported need to use bathroom, assisted to [facility] but then said unable to use due to sign on wall. once back in bed, pt hit call bell and reported need to use bathroom while OT present -reoriented that we had just gotten on [facility]                 Following commands: Impaired Following commands impaired: Follows one step commands inconsistently, Follows one step commands with increased time      Cueing   Cueing Techniques: Verbal cues, Gestural cues  Exercises      Shoulder Instructions       General Comments      Pertinent Vitals/ Pain       Pain Assessment Pain Assessment: No/denies pain  Home Living                                          Prior Functioning/Environment              Frequency  Min 2X/week        Progress Toward Goals  OT Goals(current goals can now be found in the care plan section)  Progress towards OT goals: Progressing toward goals  Acute Rehab OT Goals Patient Stated Goal: leave in 10 minutes OT Goal Formulation: With patient Time For Goal Achievement: 03/11/24 Potential to Achieve Goals: Good ADL Goals Pt Will Perform Grooming: with modified independence;standing Pt Will Perform Lower Body Bathing: with supervision;sit to/from stand;sitting/lateral leans;with adaptive equipment Pt Will Transfer to Toilet: with set-up;ambulating Pt/caregiver will Perform Home Exercise Program: Increased strength;Both right and left upper extremity;With theraband;Independently;With written HEP provided Additional ADL Goal #1: Pt to verbalize at least 3 energy conservation strategies to implement during daily routine  Plan      Co-evaluation                 AM-PAC OT 6 Clicks Daily Activity     Outcome Measure   Help from another person eating meals?: None Help from  another person taking care of personal grooming?: A Little Help from another person toileting, which includes using toliet, bedpan, or urinal?: A Little Help from another person bathing (including washing, rinsing, drying)?: A Little Help from another person to put on and taking off regular upper body clothing?: A Little Help from another person to put on and taking off regular lower body clothing?: A Lot 6 Click Score: 18    End of Session Equipment Utilized During Treatment: Gait belt;Rolling walker (2 wheels)  OT Visit Diagnosis: Unsteadiness on feet (R26.81);Other abnormalities of gait and mobility (R26.89);Muscle weakness (generalized) (M62.81)   Activity Tolerance Other (comment) (limited by cognition)   Patient Left in bed;with call bell/phone within reach;with bed alarm set   Nurse Communication Mobility status        Time: 0722-0738 OT Time Calculation (min): 16 min  Charges: OT General Charges $OT Visit: 1 Visit OT Treatments $Self Care/Home Management : 8-22 mins  Mliss NOVAK, OTR/L Acute Rehab Services Office: [phone]   Mliss Fish 03/08/2024, 8:25 AM

## 2024-03-08 NOTE — Sedation Documentation (Signed)
 Writer attempted to call report to 2w 7 times, and could not get through to primary RN or charge. Reprt given in [employer]

## 2024-03-08 NOTE — Procedures (Signed)
 Interventional Radiology Procedure Note  Procedure: RT ABD TUNNELED PERITONEAL DRAIN(ABD PLEURX)    Complications: None  Estimated Blood Loss:  MIN  Findings: FULL REPORT IN PACS     EMERSON FREDERIC SPECKING, MD

## 2024-03-09 ENCOUNTER — Telehealth: Payer: Self-pay

## 2024-03-09 NOTE — Telephone Encounter (Signed)
 Prior Authorization for patient (Lidocaine  5% patches) came through on cover my meds was submitted awaiting approval or denial.  [id number]

## 2024-03-09 NOTE — Telephone Encounter (Signed)
 [address] ([employer]) [place] Case ID #: [id number] Rx #: [id number] Need Help? Call us  at [phone] Outcome Approved today by [facility] NCPDP 2017 Your request has been approved Effective Date: 07/30/2023 Authorization Expiration Date: 07/28/2024 Drug Lidocaine  5% patches ePA cloud logo Form [employer] Electronic PA Form (2017 NCPDP) Original Claim Info [id number] PRECERT REQ BY MD [phone] REQUIRED MD CALL [phone] REQUIRES PRIOR AUTHORIZATION

## 2024-03-11 NOTE — Telephone Encounter (Signed)
 Pt d/c from hospital on 03/08/24. Left message for patient to call back.

## 2024-03-12 NOTE — Telephone Encounter (Signed)
 Left message for patient to call back

## 2024-03-18 NOTE — Telephone Encounter (Signed)
 Unable to reach patient x 3. Left detailed message for patient/wife to call back. Letter sent to pt as well.

## 2024-03-25 ENCOUNTER — Ambulatory Visit: Admitting: Physician Assistant

## 2024-03-25 NOTE — Progress Notes (Deleted)
 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 represented by ascites with recent admission discharged with hospice of [facility] 03/06/2024 WBC 8.3 HGB 8.9 Platelets 169 02/27/2024 AST 14 ALT 12 Alkphos 50 TBili 0.7 02/25/2024 INR 1.2 MELD 3.0: 17 at 02/27/2024  1:55 AM MELD-Na: 17 at 02/27/2024  1:55 AM Calculated from: Serum Creatinine: 1.71 mg/dL at 08/05/7972  8:44 AM Serum Sodium: 134 mmol/L at 02/27/2024  1:55 AM Total Bilirubin: 0.7 mg/dL (Using min of 1 mg/dL) at 08/05/7972  8:44 AM Serum Albumin : 2.2 g/dL at 08/05/7972  8:44 AM INR(ratio): 1.2 at 02/25/2024 12:04 PM Age at listing (hypothetical): 53 years Sex: Male at 02/27/2024  1:55 AM  Serologic workup: -2023 Ascites:    Recent admissions for HFpEF as well as decompensated cirrhosis ascites complicated with hyponatremia and CKD On Lasix  40 mg twice daily and spironolactone  50 mg daily Last LVP 02/25/2024 currently now with Pleurx catheter for therapeutic paracenteses and discharged into hospice 8/11. Peritoneal fluid negative for SBP no malignant cells identified 7/30 -Nutrition and low sodium diet discussed with patient and information given - Check kidney function -Continue therapeutic paracenteses Varices screening / surveillance EGD:     Last EGD 02/20/2023 with Dr. San at [facility] for IDA showed grade 2 varices small in size short segment Barrett's, moderate nodular GAVE with pockets of active oozing 6 bands successfully placed moderate portal hypertensive gastropathy mucosa friable contact oozing with endoscope normal duodenum Not on prophylaxis Hepatic encephalopathy:  AMS during recent hospitalization August 11 started on lactulose  30 g 4 times daily {acaccirrhosishepaticencephalopathy:26796} Most recent HCC screening:    US  on 06/04/2022 without reported focal liver  lesions.   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 No nodular appearance to the esophagus. EUS portions otherwise unremarkable. Esophageal biopsies confirmed nondysplastic Barrett's Esophagus    Thrombocytopenia (HCC) Monitor closely, secondary to cirrhosis. Ideally want platelets above 50 prior to endoscopic evaluation, recheck today   B12 deficiency Continue B12 supplements   Drug-induced pancytopenia (HCC)   Myasthenia gravis (HCC)  A-fib  Patient Care Team: Rosan Mix, MD as PCP - General (Internal Medicine) Hospital, Darryll Lien as Referring Physician (General Practice)  HISTORY OF PRESENT ILLNESS: 78 y.o. male with medical history significant for diabetes, CAD, paroxysmal A-fib, hyperlipidemia, HTN, myasthenia gravis (not currently on IVIG), degenerative disc disease with chronic low back pain, IDA, cirrhosis with esophageal varices, suspected GAVE, portal hypertensive gastropathy, ascites presents for follow up of cirrhosis secondary to NASH. Has history of the following complications from cirrhosis: esophageal varices, portal hypertensive gastropathy, ascites, and thrombocytopenia  Last seen in the office 01/2023 for IDA and EGD with nodular GAVE status post 6 bands, grade 2 varices short segment Barrett's. Recent hospitalization 7/30 through 8/11 for ascites in the setting of hyponatremia and renal insufficiency.   Last EGD was 02/20/2023 with Dr. San at [facility] for IDA showed grade 2  varices small in size short segment Barrett's, moderate nodular GAVE with pockets  of active oozing 6 bands successfully placed moderate portal hypertensive gastropathy mucosa friable contact oozing with endoscope normal duodenum Last HCC screen: *** Last AFP 05/28/2022 1.7  Last INR: 02/25/2024 1.2   {cirrhosishepaticencephalopathy:26796} He {Denies/complains:31533} swelling.  He {Actions; are/are not:16769} on spironolactone  and lasix .  Wt Readings from Last 3 Encounters:  03/07/24 185 lb 6.4 oz (84.1 kg)  01/02/24 195 lb 12.3 oz (88.8 kg)  02/20/23 182 lb (82.6 kg)    Discussed the use of AI scribe software for clinical note transcription with the patient, who gave verbal consent to proceed.  History of Present Illness            Hepatitis immunity status:  05/28/2022 HepA REACTIVE  05/28/2022 HepBsAG NON-REACTIVE  05/28/2022 HepAsAB NON-REACTIVE  No results found for requested labs within last 1095 days. HepCAB No results found for requested labs within last 1095 days.   Social history:  He  reports that he has quit smoking. He has never used smokeless tobacco. He reports that he does not drink alcohol and does not use drugs.  RELEVANT GI HISTORY, LABS, IMAGING:  CBC    Component Value Date/Time   WBC 8.3 03/06/2024 0909   RBC 3.55 (L) 03/06/2024 0909   HGB 8.9 (L) 03/06/2024 0909   HGB 12.1 (L) 07/03/2017 1253   HCT 29.3 (L) 03/06/2024 0909   HCT 35.7 (L) 07/03/2017 1253   PLT 169 03/06/2024 0909   PLT 167 07/03/2017 1253   MCV 82.5 03/06/2024 0909   MCV 109 (H) 07/03/2017 1253   MCH 25.1 (L) 03/06/2024 0909   MCHC 30.4 03/06/2024 0909   RDW 16.6 (H) 03/06/2024 0909   RDW 23.1 (H) 07/03/2017 1253   LYMPHSABS 0.8 03/06/2024 0909   LYMPHSABS 0.9 07/03/2017 1253   MONOABS 0.8 03/06/2024 0909   EOSABS 0.1 03/06/2024 0909   EOSABS 0.1 07/03/2017 1253   BASOSABS 0.1 03/06/2024 0909   BASOSABS 0.1 07/03/2017 1253   Recent Labs    02/26/24 1255 02/27/24 0155  02/27/24 1422 02/28/24 0239 02/29/24 0846 03/01/24 0329 03/02/24 0328 03/03/24 0902 03/04/24 0202 03/06/24 0909  HGB 9.5* 7.9* 8.8* 9.0* 9.0* 8.6* 8.8* 8.8* 8.9* 8.9*    CMP     Component Value Date/Time   NA 133 (L) 03/06/2024 0909   NA 140 07/03/2017 1253   K 4.2 03/06/2024 0909   CL 100 03/06/2024 0909   CO2 23 03/06/2024 0909   GLUCOSE 199 (H) 03/06/2024 0909   BUN 20 03/06/2024 0909   BUN 22 07/03/2017 1253   CREATININE 1.65 (H) 03/06/2024 0909   CALCIUM  8.7 (L) 03/06/2024 0909   PROT 5.1 (L) 02/27/2024 0155   PROT 6.2 07/03/2017 1253   ALBUMIN  2.4 (L) 02/28/2024 0239   ALBUMIN  2.8 (L) 07/03/2017 1253   AST 14 (L) 02/27/2024 0155   ALT 12 02/27/2024 0155   ALKPHOS 50 02/27/2024 0155   BILITOT 0.7 02/27/2024 0155   BILITOT 5.9 (H) 07/03/2017 1253   GFRNONAA 43 (L) 03/06/2024 0909   GFRAA 43 (L) 02/03/2018 0437      Latest Ref Rng & Units 02/28/2024    2:39 AM 02/27/2024    1:55 AM 02/26/2024    1:56 AM  Hepatic Function  Total Protein 6.5 - 8.1 g/dL  5.1  5.6   Albumin  3.5 - 5.0 g/dL 2.4  2.2  2.7   AST 15 - 41 U/L  14  16   ALT 0 - 44 U/L  12  13   Alk  Phosphatase 38 - 126 U/L  50  52   Total Bilirubin 0.0 - 1.2 mg/dL  0.7  0.8       Latest Ref Rng & Units 05/28/2022    2:46 PM  Hepatitis C  AFP <6.1 ng/mL 1.7     Current Medications:   Current Outpatient Medications (Endocrine & Metabolic):    insulin  aspart (NOVOLOG ) 100 UNIT/ML injection, Inject 4-6 Units into the skin See admin instructions. Inject 4-6 units into the skin three times daily, after meals.   insulin  glargine (LANTUS ) 100 UNIT/ML injection, Inject 0.15 mLs (15 Units total) into the skin at bedtime.  Current Outpatient Medications (Cardiovascular):    diltiazem  (TIAZAC ) 120 MG 24 hr capsule, Take 120 mg by mouth daily.   furosemide  (LASIX ) 40 MG tablet, Take 1 tablet (40 mg total) by mouth 2 (two) times daily.   spironolactone  (ALDACTONE ) 50 MG tablet, Take 1 tablet (50 mg total) by  mouth daily.   Current Outpatient Medications (Analgesics):    traMADol  (ULTRAM ) 50 MG tablet, Take 1 tablet (50 mg total) by mouth every 6 (six) hours as needed for moderate pain. (Patient taking differently: Take 50 mg by mouth 2 (two) times daily as needed (pain).)  Current Outpatient Medications (Hematological):    ferrous sulfate  325 (65 FE) MG EC tablet, Take 325 mg by mouth daily.  Current Outpatient Medications (Other):    Cholecalciferol  (VITAMIN D3) 125 MCG (5000 UT) TABS, Take 5,000 Units by mouth 2 (two) times daily.   diclofenac  Sodium (VOLTAREN ) 1 % GEL, Apply 2 g topically 4 (four) times daily as needed (pain).   lactulose  (CHRONULAC ) 10 GM/15ML solution, Take 45 mLs (30 g total) by mouth 3 (three) times daily.   lidocaine  (LIDODERM ) 5 %, Place 1 patch onto the skin daily as needed (pain). Remove & Discard patch within 12 hours or as directed by MD   Omega-3 Fatty Acids (FISH OIL PO), Take 1 capsule by mouth in the morning.   omeprazole (PRILOSEC) 40 MG capsule, Take 40 mg by mouth at bedtime.   polyethylene glycol (MIRALAX  / GLYCOLAX ) 17 g packet, Take 17 g by mouth daily as needed for moderate constipation.   Probiotic Product (PROBIOTIC PO), Take 1 capsule by mouth daily.   senna-docusate (SENOKOT-S) 8.6-50 MG tablet, Take 1 tablet by mouth at bedtime as needed for mild constipation.  Medical History:  Past Medical History:  Diagnosis Date   Arthritis    Diabetes (HCC)    High cholesterol    Hypertension    Kidney stone    Myasthenia gravis (HCC)    Allergies:  Allergies  Allergen Reactions   Imuran  [Azathioprine ] Other (See Comments)    Abnormal LIver functional    Zestril [Lisinopril] Cough     Surgical History:  He  has a past surgical history that includes Skin cancer excision (Right, 2016); Colonoscopy (01/09/2021); Esophagogastroduodenoscopy (N/A, 01/14/2018); Esophagogastroduodenoscopy (01/09/2021); Esophagogastroduodenoscopy (egd) with propofol  (N/A,  06/10/2022); Hot hemostasis (N/A, 06/10/2022); Esophagogastroduodenoscopy (N/A, 01/16/2023); gastric varices banding (01/16/2023); Esophagogastroduodenoscopy (egd) with propofol  (N/A, 02/20/2023); gastric varices banding (02/20/2023); IR Paracentesis (02/25/2024); and IR Perc [employer] [facility] (03/08/2024). Family History:  His family history includes Breast cancer in his mother; Diabetes in his maternal grandfather; Heart disease in his maternal grandfather; Other in his father.  REVIEW OF SYSTEMS  : All other systems reviewed and negative except where noted in the History of Present Illness.  PHYSICAL EXAM: There were no vitals taken for this visit. General :  Alert, well developed male in no acute distress Head:  Normocephalic and atraumatic. Eyes :  {sclerae:26738},conjunctive {conjuctiva:26739}  Heart:  {HEART EXAM HEM/ONC:21750} Pulm:  Clear anteriorly; no wheezing Abdomen:   {BlankSingle:19197::Distended,Ridged,Soft}, {BlankSingle:19197::Flat,Obese} AB, skin exam {ABDOMEN SKIN EXAM:22649}, {BlankSingle:19197::Absent,Hyperactive, tinkling,Hypoactive,Sluggish,Normal} bowel sounds. {Desc; pc desc - abdomen tenderness:5168} tenderness {anatomy; site abdomen:5010}. {BlankMultiple:19196::Without guarding,With guarding,Without rebound,With rebound}, {Exam; abdomen organomegaly:15152}. {No plus wild [phone]}  fluid wave, {No plus wild [phone]}  shifting dullness.  Extremities:   {With/Without:304960234} edema. Msk:  Symmetrical without gross deformities. Peripheral pulses intact.  Neurologic: Alert and  oriented x4;  grossly normal neurologically. {With-without:32421} asterixis or clonus.  Skin:   {With-without:32421} jaundice. {No plus wild [phone]} palmar erythema or spider angioma.   Psychiatric:  Demonstrates good judgement and reason without abnormal affect or behaviors.    Alan JONELLE Coombs, PA-C 8:07 AM

## 2024-03-29 DEATH — deceased

## 2024-03-30 ENCOUNTER — Other Ambulatory Visit: Payer: Self-pay | Admitting: Student

## 2024-03-30 NOTE — Telephone Encounter (Signed)
 Not [facility] patient
